# Patient Record
Sex: Female | Born: 1935
Health system: Southern US, Community
[De-identification: ages and names within clinical notes are randomized; demographics above are authoritative.]

## PROBLEM LIST (undated history)

## (undated) DIAGNOSIS — IMO0001 Reserved for inherently not codable concepts without codable children: Secondary | ICD-10-CM

## (undated) DIAGNOSIS — E039 Hypothyroidism, unspecified: Secondary | ICD-10-CM

## (undated) DIAGNOSIS — Z8719 Personal history of other diseases of the digestive system: Secondary | ICD-10-CM

## (undated) DIAGNOSIS — M1711 Unilateral primary osteoarthritis, right knee: Secondary | ICD-10-CM

## (undated) DIAGNOSIS — M1712 Unilateral primary osteoarthritis, left knee: Secondary | ICD-10-CM

## (undated) DIAGNOSIS — T8859XA Other complications of anesthesia, initial encounter: Secondary | ICD-10-CM

## (undated) DIAGNOSIS — C8290 Follicular lymphoma, unspecified, unspecified site: Secondary | ICD-10-CM

## (undated) DIAGNOSIS — Z950 Presence of cardiac pacemaker: Secondary | ICD-10-CM

## (undated) DIAGNOSIS — I10 Essential (primary) hypertension: Secondary | ICD-10-CM

## (undated) DIAGNOSIS — M1990 Unspecified osteoarthritis, unspecified site: Secondary | ICD-10-CM

## (undated) DIAGNOSIS — I350 Nonrheumatic aortic (valve) stenosis: Secondary | ICD-10-CM

## (undated) DIAGNOSIS — E785 Hyperlipidemia, unspecified: Secondary | ICD-10-CM

## (undated) DIAGNOSIS — J45909 Unspecified asthma, uncomplicated: Secondary | ICD-10-CM

## (undated) DIAGNOSIS — R112 Nausea with vomiting, unspecified: Secondary | ICD-10-CM

## (undated) DIAGNOSIS — Z8489 Family history of other specified conditions: Secondary | ICD-10-CM

## (undated) DIAGNOSIS — H8109 Meniere's disease, unspecified ear: Secondary | ICD-10-CM

## (undated) DIAGNOSIS — J329 Chronic sinusitis, unspecified: Secondary | ICD-10-CM

## (undated) DIAGNOSIS — I4891 Unspecified atrial fibrillation: Secondary | ICD-10-CM

## (undated) DIAGNOSIS — Z9889 Other specified postprocedural states: Secondary | ICD-10-CM

## (undated) DIAGNOSIS — R0600 Dyspnea, unspecified: Secondary | ICD-10-CM

## (undated) DIAGNOSIS — T4145XA Adverse effect of unspecified anesthetic, initial encounter: Secondary | ICD-10-CM

## (undated) DIAGNOSIS — K219 Gastro-esophageal reflux disease without esophagitis: Secondary | ICD-10-CM

## (undated) HISTORY — PX: LUMBAR FUSION: SHX111

## (undated) HISTORY — DX: Nonrheumatic aortic (valve) stenosis: I35.0

## (undated) HISTORY — DX: Unspecified atrial fibrillation: I48.91

## (undated) HISTORY — PX: CATARACT EXTRACTION: SUR2

## (undated) HISTORY — PX: CHOLECYSTECTOMY: SHX55

## (undated) HISTORY — PX: OTHER SURGICAL HISTORY: SHX169

## (undated) HISTORY — PX: ABDOMINAL HYSTERECTOMY: SHX81

## (undated) HISTORY — PX: CARPAL TUNNEL RELEASE: SHX101

## (undated) HISTORY — PX: KNEE ARTHROSCOPY: SUR90

## (undated) HISTORY — PX: NASAL SINUS SURGERY: SHX719

---

## 2012-12-25 DIAGNOSIS — R0600 Dyspnea, unspecified: Secondary | ICD-10-CM | POA: Insufficient documentation

## 2013-06-24 ENCOUNTER — Emergency Department (INDEPENDENT_AMBULATORY_CARE_PROVIDER_SITE_OTHER): Payer: Medicare Other

## 2013-06-24 ENCOUNTER — Emergency Department (INDEPENDENT_AMBULATORY_CARE_PROVIDER_SITE_OTHER)
Admission: EM | Admit: 2013-06-24 | Discharge: 2013-06-24 | Disposition: A | Discharge status: Home / Self Care | Payer: Medicare Other | Source: Home / Self Care | Attending: Family Medicine | Admitting: Family Medicine

## 2013-06-24 ENCOUNTER — Encounter: Payer: Self-pay | Admitting: Emergency Medicine

## 2013-06-24 DIAGNOSIS — J45909 Unspecified asthma, uncomplicated: Secondary | ICD-10-CM

## 2013-06-24 DIAGNOSIS — R05 Cough: Secondary | ICD-10-CM

## 2013-06-24 DIAGNOSIS — R059 Cough, unspecified: Secondary | ICD-10-CM

## 2013-06-24 DIAGNOSIS — J309 Allergic rhinitis, unspecified: Secondary | ICD-10-CM

## 2013-06-24 DIAGNOSIS — J3089 Other allergic rhinitis: Secondary | ICD-10-CM

## 2013-06-24 DIAGNOSIS — R9389 Abnormal findings on diagnostic imaging of other specified body structures: Secondary | ICD-10-CM

## 2013-06-24 HISTORY — DX: Essential (primary) hypertension: I10

## 2013-06-24 HISTORY — DX: Unspecified asthma, uncomplicated: J45.909

## 2013-06-24 HISTORY — DX: Chronic sinusitis, unspecified: J32.9

## 2013-06-24 HISTORY — DX: Hyperlipidemia, unspecified: E78.5

## 2013-06-24 HISTORY — DX: Reserved for inherently not codable concepts without codable children: IMO0001

## 2013-06-24 HISTORY — DX: Gastro-esophageal reflux disease without esophagitis: K21.9

## 2013-06-24 LAB — POCT CBC W AUTO DIFF (K'VILLE URGENT CARE)

## 2013-06-24 MED ORDER — ALBUTEROL SULFATE (2.5 MG/3ML) 0.083% IN NEBU
2.5000 mg | INHALATION_SOLUTION | Freq: Once | RESPIRATORY_TRACT | Status: AC
  Administered 2013-06-24: 2.5 mg via RESPIRATORY_TRACT

## 2013-06-24 MED ORDER — CEFDINIR 300 MG PO CAPS: 300.0000 mg | ORAL_CAPSULE | Freq: Two times a day (BID) | ORAL | Status: DC

## 2013-06-24 MED ORDER — AEROCHAMBER PLUS FLO-VU MEDIUM MISC: Status: DC

## 2013-06-24 MED ORDER — FLUTICASONE PROPIONATE 50 MCG/ACT NA SUSP: 2.0000 | Freq: Every day | NASAL | Status: DC

## 2013-06-24 MED ORDER — MONTELUKAST SODIUM 10 MG PO TABS: 10.0000 mg | ORAL_TABLET | Freq: Every day | ORAL | Status: DC

## 2013-06-24 NOTE — ED Provider Notes (Signed)
CSN: [id number]     Arrival date & time 06/24/13  1606 History   First MD Initiated Contact with Patient 06/24/13 1627     Chief Complaint  Patient presents with  . Cough  . Wheezing      HPI Comments: Patient lives in [place], visiting family here for several weeks.  She has a history of asthma.  Approximately 3 months ago she developed a URI that lasted approximately a month, and she was treated with 6 courses of antibiotics before she finally cleared.  She states that she had a chest X-ray on Nov 1 that was negative.  She arrived in [place] about one month ago, and 3 weeks ago she developed increased cough, sinus congestion, and wheezing.  She has had pleuritic pain in her left posterior chest for two days. Her current meds include Flovent, two puffs BID, and Ventolin BID prn.  She states that she has used a Flonase nasal inhaler in the past.  The history is provided by the patient and a relative.    Past Medical History  Diagnosis Date  . Hypertension   . Hyperlipidemia   . Reflux   . Asthma   . Chronic sinusitis    Past Surgical History  Procedure Laterality Date  . Cholecystectomy    . Abdominal hysterectomy    . Knee arthroscopy     Family History  Problem Relation Age of Onset  . Heart disease Other    History  Substance Use Topics  . Smoking status: Never Smoker   . Smokeless tobacco: Never Used  . Alcohol Use: No   OB History   Grav Para Term Preterm Abortions TAB SAB Ect Mult Living                 Review of Systems + sore throat + cough + left pleuritic pain + wheezing + nasal congestion + post-nasal drainage No sinus pain/pressure No itchy/red eyes No earache No hemoptysis + SOB No fever/chills No nausea No vomiting No abdominal pain No diarrhea No urinary symptoms No skin rash + fatigue No myalgias + headache Used OTC meds without relief  Allergies  Review of patient's allergies indicates no known allergies.  Home Medications    Current Outpatient Rx  Name  Route  Sig  Dispense  Refill  . albuterol (PROVENTIL) (2.5 MG/3ML) 0.083% nebulizer solution   Nebulization   Take 2.5 mg by nebulization every 6 (six) hours as needed for wheezing or shortness of breath.         . cetirizine (ZYRTEC) 10 MG tablet   Oral   Take 10 mg by mouth daily.         . fluticasone (FLOVENT HFA) 110 MCG/ACT inhaler   Inhalation   Inhale into the lungs 2 (two) times daily.         . hydrochlorothiazide (HYDRODIURIL) 25 MG tablet   Oral   Take 25 mg by mouth daily.         Marland Kitchen levothyroxine (SYNTHROID, LEVOTHROID) 88 MCG tablet   Oral   Take 88 mcg by mouth daily before breakfast.         . metoprolol (LOPRESSOR) 100 MG tablet   Oral   Take 100 mg by mouth 2 (two) times daily.         Marland Kitchen omeprazole (PRILOSEC) 40 MG capsule   Oral   Take 40 mg by mouth daily.         . pravastatin (PRAVACHOL) 80 MG  tablet   Oral   Take 80 mg by mouth daily.         . cefdinir (OMNICEF) 300 MG capsule   Oral   Take 1 capsule (300 mg total) by mouth 2 (two) times daily. (Rx void after 07/02/13)   20 capsule   0   . fluticasone (FLONASE) 50 MCG/ACT nasal spray   Each Nare   Place 2 sprays into both nostrils daily.   16 g   1   . montelukast (SINGULAIR) 10 MG tablet   Oral   Take 1 tablet (10 mg total) by mouth at bedtime.   30 tablet   1   . Spacer/Aero-Holding Chambers (AEROCHAMBER PLUS FLO-VU MEDIUM) MISC      Use with Flovent inhaler   1 each   0    BP 150/77  Pulse 78  Temp(Src) 98.1 F (36.7 C) (Oral)  Resp 20  Wt 179 lb (81.194 kg)  SpO2 96% Physical Exam Nursing notes and Vital Signs reviewed. Appearance:  Patient appears stated age, and in no acute distress Eyes:  Pupils are equal, round, and reactive to light and accomodation.  Extraocular movement is intact.  Conjunctivae are not inflamed  Ears:  Canals normal.  Tympanic membranes normal.  Nose:  Mildly congested turbinates.  No sinus  tenderness.   Pharynx:  Normal Neck:  Supple.  No adenopathy Lungs:   Diffuse wheezes bilaterally.  No rales.  Breath sounds are equal.  Heart:  Regular rate and rhythm without murmurs, rubs, or gallops.  Abdomen:  Nontender without masses or hepatosplenomegaly.  Bowel sounds are present.  No CVA or flank tenderness.  Extremities:  No edema.  No calf tenderness Skin:  No rash present.   ED Course  Procedures  none    Labs Reviewed  POCT CBC W AUTO DIFF ([facility]):  WBC 6.1; LY 50.8; MO 11.1; GR 38.1; Hgb 13.3; Platelets 253    Imaging Review Dg Sinuses Complete  06/24/2013   CLINICAL DATA:  Sinus congestion  EXAM: PARANASAL SINUSES - COMPLETE 3 + VIEW  COMPARISON:  None.  FINDINGS: The frontal, ethmoid, maxillary and sphenoid sinuses are well aerated. No acute bony abnormality is seen. Some mucosal thickening is noted within the maxillary sinuses on the [facility] view. No definitive air-fluid level is seen.  IMPRESSION: Mucosal thickening.  No other focal abnormality is noted.   Electronically Signed   By: Alcide Clever M.D.   On: 06/24/2013 17:32   Dg Chest 2 View  06/24/2013   CLINICAL DATA:  Cough  EXAM: CHEST  2 VIEW  COMPARISON:  None.  FINDINGS: The heart size and mediastinal contours are within normal limits. Both lungs are clear. The visualized skeletal structures are unremarkable.  IMPRESSION: No active cardiopulmonary disease.   Electronically Signed   By: Alcide Clever M.D.   On: 06/24/2013 17:31      MDM   1. Asthma, chronic, poorly controlled   2. Perennial allergic rhinitis    Albuterol nebulizer treatment administered There is no evidence of bacterial infection today.  Normal WBC is reassuring  Begin trial of Singulair.  Rx written for spacer.  Continue Flovent.  Patient declines prescription for prednisone. Take plain Mucinex (1200 mg guaifenesin) twice daily for cough and congestion.  Increase fluid intake, rest. Continue albuterol inhaler as needed. Begin  Omnicef if not improving about one week or if persistent fever develops (Given a prescription to hold, with an expiration date)  Follow-up with  family doctor if not improving10 days.     Lattie Haw, MD 06/25/13 [phone]

## 2013-06-24 NOTE — ED Notes (Signed)
[name] is visiting from [place]. She reports since September she has been on 6 different rounds of antibiotics for bronchitis, sinus infection/uri. She had cleared up before coming to [facility] mid-November when it started again.  Cough is productive and dark yellow in color, denies fever, fatigues, body aches, HA and wheezing.

## 2013-06-27 ENCOUNTER — Telehealth: Payer: Self-pay | Admitting: *Deleted

## 2013-06-30 DIAGNOSIS — G5603 Carpal tunnel syndrome, bilateral upper limbs: Secondary | ICD-10-CM | POA: Insufficient documentation

## 2013-08-05 DIAGNOSIS — M6530 Trigger finger, unspecified finger: Secondary | ICD-10-CM

## 2013-08-05 HISTORY — DX: Trigger finger, unspecified finger: M65.30

## 2014-01-14 DIAGNOSIS — E039 Hypothyroidism, unspecified: Secondary | ICD-10-CM | POA: Insufficient documentation

## 2014-06-16 HISTORY — PX: BONE MARROW BIOPSY: SHX199

## 2014-07-28 DIAGNOSIS — C8590 Non-Hodgkin lymphoma, unspecified, unspecified site: Secondary | ICD-10-CM | POA: Insufficient documentation

## 2014-12-18 DIAGNOSIS — C8290 Follicular lymphoma, unspecified, unspecified site: Secondary | ICD-10-CM | POA: Insufficient documentation

## 2015-09-15 DIAGNOSIS — I10 Essential (primary) hypertension: Secondary | ICD-10-CM | POA: Insufficient documentation

## 2016-08-14 DIAGNOSIS — M47816 Spondylosis without myelopathy or radiculopathy, lumbar region: Secondary | ICD-10-CM | POA: Diagnosis not present

## 2016-08-14 DIAGNOSIS — M722 Plantar fascial fibromatosis: Secondary | ICD-10-CM | POA: Diagnosis not present

## 2016-09-27 DIAGNOSIS — I10 Essential (primary) hypertension: Secondary | ICD-10-CM | POA: Diagnosis not present

## 2016-09-27 DIAGNOSIS — G8929 Other chronic pain: Secondary | ICD-10-CM | POA: Diagnosis not present

## 2016-09-27 DIAGNOSIS — M545 Low back pain: Secondary | ICD-10-CM | POA: Diagnosis not present

## 2016-09-27 DIAGNOSIS — I480 Paroxysmal atrial fibrillation: Secondary | ICD-10-CM | POA: Insufficient documentation

## 2016-09-27 DIAGNOSIS — J45909 Unspecified asthma, uncomplicated: Secondary | ICD-10-CM | POA: Diagnosis not present

## 2016-09-27 DIAGNOSIS — Z79899 Other long term (current) drug therapy: Secondary | ICD-10-CM | POA: Diagnosis not present

## 2016-09-27 DIAGNOSIS — M542 Cervicalgia: Secondary | ICD-10-CM | POA: Diagnosis not present

## 2016-09-27 DIAGNOSIS — Z7951 Long term (current) use of inhaled steroids: Secondary | ICD-10-CM | POA: Diagnosis not present

## 2016-09-27 DIAGNOSIS — E039 Hypothyroidism, unspecified: Secondary | ICD-10-CM | POA: Diagnosis not present

## 2016-09-27 DIAGNOSIS — Z7689 Persons encountering health services in other specified circumstances: Secondary | ICD-10-CM | POA: Diagnosis not present

## 2016-09-27 DIAGNOSIS — E7849 Other hyperlipidemia: Secondary | ICD-10-CM | POA: Insufficient documentation

## 2016-09-27 DIAGNOSIS — R0683 Snoring: Secondary | ICD-10-CM | POA: Diagnosis not present

## 2017-01-04 DIAGNOSIS — Z9049 Acquired absence of other specified parts of digestive tract: Secondary | ICD-10-CM | POA: Diagnosis not present

## 2017-01-04 DIAGNOSIS — K5730 Diverticulosis of large intestine without perforation or abscess without bleeding: Secondary | ICD-10-CM | POA: Diagnosis not present

## 2017-01-04 DIAGNOSIS — C8200 Follicular lymphoma grade I, unspecified site: Secondary | ICD-10-CM | POA: Diagnosis not present

## 2017-01-04 DIAGNOSIS — I7090 Unspecified atherosclerosis: Secondary | ICD-10-CM | POA: Diagnosis not present

## 2017-01-04 DIAGNOSIS — D3500 Benign neoplasm of unspecified adrenal gland: Secondary | ICD-10-CM | POA: Diagnosis not present

## 2017-01-09 DIAGNOSIS — I10 Essential (primary) hypertension: Secondary | ICD-10-CM | POA: Diagnosis not present

## 2017-01-09 DIAGNOSIS — E039 Hypothyroidism, unspecified: Secondary | ICD-10-CM | POA: Diagnosis not present

## 2017-01-09 DIAGNOSIS — I440 Atrioventricular block, first degree: Secondary | ICD-10-CM | POA: Diagnosis not present

## 2017-01-09 DIAGNOSIS — I4510 Unspecified right bundle-branch block: Secondary | ICD-10-CM | POA: Diagnosis not present

## 2017-01-09 DIAGNOSIS — E785 Hyperlipidemia, unspecified: Secondary | ICD-10-CM | POA: Diagnosis not present

## 2017-01-09 DIAGNOSIS — C8230 Follicular lymphoma grade IIIa, unspecified site: Secondary | ICD-10-CM | POA: Diagnosis not present

## 2017-01-09 DIAGNOSIS — C8100 Nodular lymphocyte predominant Hodgkin lymphoma, unspecified site: Secondary | ICD-10-CM | POA: Diagnosis not present

## 2017-01-09 DIAGNOSIS — I480 Paroxysmal atrial fibrillation: Secondary | ICD-10-CM | POA: Diagnosis not present

## 2017-01-09 DIAGNOSIS — R0600 Dyspnea, unspecified: Secondary | ICD-10-CM | POA: Diagnosis not present

## 2017-01-09 DIAGNOSIS — Z79899 Other long term (current) drug therapy: Secondary | ICD-10-CM | POA: Diagnosis not present

## 2017-01-16 DIAGNOSIS — R0600 Dyspnea, unspecified: Secondary | ICD-10-CM | POA: Diagnosis not present

## 2017-02-12 DIAGNOSIS — R0600 Dyspnea, unspecified: Secondary | ICD-10-CM | POA: Diagnosis not present

## 2017-02-12 DIAGNOSIS — I080 Rheumatic disorders of both mitral and aortic valves: Secondary | ICD-10-CM | POA: Diagnosis not present

## 2017-06-02 ENCOUNTER — Emergency Department (INDEPENDENT_AMBULATORY_CARE_PROVIDER_SITE_OTHER)
Admission: EM | Admit: 2017-06-02 | Discharge: 2017-06-02 | Disposition: A | Discharge status: Home / Self Care | Payer: Medicare Other | Source: Home / Self Care | Attending: Family Medicine | Admitting: Family Medicine

## 2017-06-02 ENCOUNTER — Encounter: Payer: Self-pay | Admitting: Emergency Medicine

## 2017-06-02 DIAGNOSIS — S61216A Laceration without foreign body of right little finger without damage to nail, initial encounter: Secondary | ICD-10-CM | POA: Diagnosis not present

## 2017-06-02 NOTE — ED Provider Notes (Signed)
[name]    CSN: [id number] Arrival date & time: 06/02/17  1542     History   Chief Complaint Chief Complaint  Patient presents with  . Laceration    HPI [name] is a 82 y.o. female.   Patient lacerated her right 5th finger on the edge of a glass table this morning.   The history is provided by the patient.  Laceration  Location:  Finger Length:  1cm Depth:  Through dermis Bleeding: controlled   Time since incident:  8 hours Injury mechanism: glass table edge. Pain details:    Severity:  No pain Foreign body present:  No foreign bodies Relieved by:  None tried Worsened by:  Nothing Ineffective treatments:  None tried Tetanus status:  Unknown   Past Medical History:  Diagnosis Date  . Asthma   . Chronic sinusitis   . Hyperlipidemia   . Hypertension   . Reflux     There are no active problems to display for this patient.   Past Surgical History:  Procedure Laterality Date  . ABDOMINAL HYSTERECTOMY    . CHOLECYSTECTOMY    . KNEE ARTHROSCOPY      OB History    No data available       Home Medications    Prior to Admission medications   Medication Sig Start Date End Date Taking? Authorizing Provider  albuterol (PROVENTIL) (2.5 MG/3ML) 0.083% nebulizer solution Take 2.5 mg by nebulization every 6 (six) hours as needed for wheezing or shortness of breath.    [provider]  cefdinir (OMNICEF) 300 MG capsule Take 1 capsule (300 mg total) by mouth 2 (two) times daily. (Rx void after 07/02/13) 06/24/13   Kandra Nicolas, MD  cetirizine (ZYRTEC) 10 MG tablet Take 10 mg by mouth daily.    [provider]  fluticasone (FLONASE) 50 MCG/ACT nasal spray Place 2 sprays into both nostrils daily. 06/24/13   Kandra Nicolas, MD  fluticasone (FLOVENT HFA) 110 MCG/ACT inhaler Inhale into the lungs 2 (two) times daily.    [provider]  hydrochlorothiazide (HYDRODIURIL) 25 MG tablet Take 25 mg by mouth daily.     [provider]  levothyroxine (SYNTHROID, LEVOTHROID) 88 MCG tablet Take 88 mcg by mouth daily before breakfast.    [provider]  metoprolol (LOPRESSOR) 100 MG tablet Take 100 mg by mouth 2 (two) times daily.    [provider]  montelukast (SINGULAIR) 10 MG tablet Take 1 tablet (10 mg total) by mouth at bedtime. 06/24/13   Kandra Nicolas, MD  omeprazole (PRILOSEC) 40 MG capsule Take 40 mg by mouth daily.    [provider]  pravastatin (PRAVACHOL) 80 MG tablet Take 80 mg by mouth daily.    [provider]  Spacer/Aero-Holding Chambers (AEROCHAMBER PLUS FLO-VU MEDIUM) MISC Use with Flovent inhaler 06/24/13   Kandra Nicolas, MD    Family History Family History  Problem Relation Age of Onset  . Heart disease Other     Social History Social History   Tobacco Use  . Smoking status: Never Smoker  . Smokeless tobacco: Never Used  Substance Use Topics  . Alcohol use: No  . Drug use: No     Allergies   Patient has no known allergies.   Review of Systems Review of Systems  All other systems reviewed and are negative.    Physical Exam Triage Vital Signs ED Triage Vitals  Enc Vitals Group     BP  06/02/17 1605 (!) 198/74     Pulse Rate 06/02/17 1605 67     Resp 06/02/17 1605 16     Temp 06/02/17 1605 98.3 F (36.8 C)     Temp Source 06/02/17 1605 Oral     SpO2 06/02/17 1605 94 %     Weight 06/02/17 1608 180 lb (81.6 kg)     Height 06/02/17 1608 5\' 5"  (1.651 m)     Head Circumference --      Peak Flow --      Pain Score --      Pain Loc --      Pain Edu? --      Excl. in [place]? --    No data found.  Updated Vital Signs BP (!) 198/74 (BP Location: Right Arm)   Pulse 67   Temp 98.3 F (36.8 C) (Oral)   Resp 16   Ht 5\' 5"  (1.651 m)   Wt 180 lb (81.6 kg)   SpO2 94%   BMI 29.95 kg/m   Visual Acuity Right Eye Distance:   Left Eye Distance:   Bilateral Distance:    Right Eye Near:   Left Eye Near:    Bilateral  Near:     Physical Exam  HENT:  Head: Normocephalic.  Eyes: Conjunctivae are normal. Pupils are equal, round, and reactive to light.  Cardiovascular: Normal rate.  Pulmonary/Chest: Breath sounds normal.  Musculoskeletal:       Right hand: She exhibits laceration. She exhibits normal range of motion, no tenderness, no bony tenderness, normal two-point discrimination, normal capillary refill, no deformity and no swelling.       Hands: Right fifth finger has 1cm long simple laceration over ulnar aspect of proximal phalanx.  Finger has full range of motion all joints.  Distal neurovascular function is intact.   Neurological: She is alert.  Skin: Skin is warm and dry.  Nursing note and vitals reviewed.    UC Treatments / Results  Labs (all labs ordered are listed, but only abnormal results are displayed) Labs Reviewed - No data to display  EKG  EKG Interpretation None       Radiology No results found.  Procedures Procedures  Laceration Repair Discussed benefits and risks of procedure and verbal consent obtained. Using sterile technique and local anesthesia with 1% lidocaine without epinephrine, cleansed wound with Betadine followed by copious lavage with normal saline.  Wound carefully inspected for debris and foreign bodies; none found.  Wound closed with #3, 5-0 interrupted nylon sutures.  Bacitracin and non-stick sterile dressing applied.  Wound precautions explained to patient.  Return for suture removal in 10 days.   Medications Ordered in UC Medications - No data to display   Initial Impression / Assessment and Plan / UC Course  I have reviewed the triage vital signs and the nursing notes.  Pertinent labs & imaging results that were available during my care of the patient were reviewed by me and considered in my medical decision making (see chart for details).    Change dressing daily and apply Bacitracin ointment to wound.  Keep wound clean and dry.  Return for any  signs of infection (or follow-up with family doctor):  Increasing redness, swelling, pain, heat, drainage, etc. Return in 10 days for suture removal.      Final Clinical Impressions(s) / UC Diagnoses   Final diagnoses:  Laceration of right little finger without foreign body without damage to nail, initial encounter    ED Discharge  Orders    None           Kandra Nicolas, MD 06/03/17 [phone]

## 2017-06-02 NOTE — ED Triage Notes (Signed)
Patient presents to [facility] with C/O laceration in the right little finger over a piece of glass.

## 2017-06-02 NOTE — Discharge Instructions (Signed)
Change dressing daily and apply Bacitracin ointment to wound.  Keep wound clean and dry.  Return for any signs of infection (or follow-up with family doctor):  Increasing redness, swelling, pain, heat, drainage, etc. °Return in 10 days for suture removal.   °

## 2017-07-16 DIAGNOSIS — M5030 Other cervical disc degeneration, unspecified cervical region: Secondary | ICD-10-CM | POA: Insufficient documentation

## 2017-07-16 DIAGNOSIS — G43A0 Cyclical vomiting, not intractable: Secondary | ICD-10-CM | POA: Diagnosis not present

## 2017-07-16 DIAGNOSIS — I10 Essential (primary) hypertension: Secondary | ICD-10-CM | POA: Diagnosis not present

## 2017-07-16 DIAGNOSIS — E6609 Other obesity due to excess calories: Secondary | ICD-10-CM | POA: Insufficient documentation

## 2017-07-16 DIAGNOSIS — E7849 Other hyperlipidemia: Secondary | ICD-10-CM | POA: Diagnosis not present

## 2017-07-16 DIAGNOSIS — I480 Paroxysmal atrial fibrillation: Secondary | ICD-10-CM | POA: Diagnosis not present

## 2017-07-16 DIAGNOSIS — K219 Gastro-esophageal reflux disease without esophagitis: Secondary | ICD-10-CM | POA: Insufficient documentation

## 2017-07-16 DIAGNOSIS — Z6830 Body mass index (BMI) 30.0-30.9, adult: Secondary | ICD-10-CM

## 2017-07-16 DIAGNOSIS — E039 Hypothyroidism, unspecified: Secondary | ICD-10-CM | POA: Diagnosis not present

## 2017-07-16 DIAGNOSIS — C8100 Nodular lymphocyte predominant Hodgkin lymphoma, unspecified site: Secondary | ICD-10-CM | POA: Diagnosis not present

## 2017-07-19 DIAGNOSIS — E039 Hypothyroidism, unspecified: Secondary | ICD-10-CM | POA: Diagnosis not present

## 2017-07-19 DIAGNOSIS — R1115 Cyclical vomiting syndrome unrelated to migraine: Secondary | ICD-10-CM | POA: Insufficient documentation

## 2017-07-19 DIAGNOSIS — E7849 Other hyperlipidemia: Secondary | ICD-10-CM | POA: Diagnosis not present

## 2017-07-19 DIAGNOSIS — Z7982 Long term (current) use of aspirin: Secondary | ICD-10-CM | POA: Diagnosis not present

## 2017-07-19 DIAGNOSIS — C8100 Nodular lymphocyte predominant Hodgkin lymphoma, unspecified site: Secondary | ICD-10-CM | POA: Diagnosis not present

## 2017-07-19 DIAGNOSIS — I480 Paroxysmal atrial fibrillation: Secondary | ICD-10-CM | POA: Diagnosis not present

## 2017-07-19 DIAGNOSIS — E669 Obesity, unspecified: Secondary | ICD-10-CM | POA: Diagnosis not present

## 2017-07-19 DIAGNOSIS — I10 Essential (primary) hypertension: Secondary | ICD-10-CM | POA: Diagnosis not present

## 2017-07-19 DIAGNOSIS — C8200 Follicular lymphoma grade I, unspecified site: Secondary | ICD-10-CM | POA: Diagnosis not present

## 2017-07-19 DIAGNOSIS — C8230 Follicular lymphoma grade IIIa, unspecified site: Secondary | ICD-10-CM | POA: Diagnosis not present

## 2017-07-19 DIAGNOSIS — K219 Gastro-esophageal reflux disease without esophagitis: Secondary | ICD-10-CM | POA: Diagnosis not present

## 2017-07-19 DIAGNOSIS — Z6830 Body mass index (BMI) 30.0-30.9, adult: Secondary | ICD-10-CM | POA: Diagnosis not present

## 2017-07-19 DIAGNOSIS — Z79899 Other long term (current) drug therapy: Secondary | ICD-10-CM | POA: Diagnosis not present

## 2017-08-07 DIAGNOSIS — J9859 Other diseases of mediastinum, not elsewhere classified: Secondary | ICD-10-CM | POA: Diagnosis not present

## 2017-08-07 DIAGNOSIS — C8591 Non-Hodgkin lymphoma, unspecified, lymph nodes of head, face, and neck: Secondary | ICD-10-CM | POA: Diagnosis not present

## 2017-08-07 DIAGNOSIS — C8200 Follicular lymphoma grade I, unspecified site: Secondary | ICD-10-CM | POA: Diagnosis not present

## 2017-08-09 DIAGNOSIS — C8234 Follicular lymphoma grade IIIa, lymph nodes of axilla and upper limb: Secondary | ICD-10-CM | POA: Diagnosis not present

## 2017-08-09 DIAGNOSIS — I10 Essential (primary) hypertension: Secondary | ICD-10-CM | POA: Diagnosis not present

## 2017-08-09 DIAGNOSIS — C8230 Follicular lymphoma grade IIIa, unspecified site: Secondary | ICD-10-CM | POA: Diagnosis not present

## 2017-08-09 DIAGNOSIS — C8100 Nodular lymphocyte predominant Hodgkin lymphoma, unspecified site: Secondary | ICD-10-CM | POA: Diagnosis not present

## 2017-08-09 DIAGNOSIS — E7849 Other hyperlipidemia: Secondary | ICD-10-CM | POA: Diagnosis not present

## 2017-08-13 DIAGNOSIS — C8230 Follicular lymphoma grade IIIa, unspecified site: Secondary | ICD-10-CM | POA: Diagnosis not present

## 2017-08-17 DIAGNOSIS — E039 Hypothyroidism, unspecified: Secondary | ICD-10-CM | POA: Diagnosis not present

## 2017-08-17 DIAGNOSIS — I10 Essential (primary) hypertension: Secondary | ICD-10-CM | POA: Diagnosis not present

## 2017-08-17 DIAGNOSIS — K219 Gastro-esophageal reflux disease without esophagitis: Secondary | ICD-10-CM | POA: Diagnosis not present

## 2017-08-17 DIAGNOSIS — I480 Paroxysmal atrial fibrillation: Secondary | ICD-10-CM | POA: Diagnosis not present

## 2017-08-17 DIAGNOSIS — Z6830 Body mass index (BMI) 30.0-30.9, adult: Secondary | ICD-10-CM | POA: Diagnosis not present

## 2017-08-17 DIAGNOSIS — E6609 Other obesity due to excess calories: Secondary | ICD-10-CM | POA: Diagnosis not present

## 2017-08-23 DIAGNOSIS — Z510 Encounter for antineoplastic radiation therapy: Secondary | ICD-10-CM | POA: Diagnosis not present

## 2017-08-23 DIAGNOSIS — C8230 Follicular lymphoma grade IIIa, unspecified site: Secondary | ICD-10-CM | POA: Diagnosis not present

## 2017-08-27 DIAGNOSIS — C8230 Follicular lymphoma grade IIIa, unspecified site: Secondary | ICD-10-CM | POA: Diagnosis not present

## 2017-08-28 DIAGNOSIS — Z510 Encounter for antineoplastic radiation therapy: Secondary | ICD-10-CM | POA: Diagnosis not present

## 2017-08-28 DIAGNOSIS — C8230 Follicular lymphoma grade IIIa, unspecified site: Secondary | ICD-10-CM | POA: Diagnosis not present

## 2017-09-03 DIAGNOSIS — Z510 Encounter for antineoplastic radiation therapy: Secondary | ICD-10-CM | POA: Diagnosis not present

## 2017-09-03 DIAGNOSIS — C8230 Follicular lymphoma grade IIIa, unspecified site: Secondary | ICD-10-CM | POA: Diagnosis not present

## 2017-09-04 DIAGNOSIS — Z510 Encounter for antineoplastic radiation therapy: Secondary | ICD-10-CM | POA: Diagnosis not present

## 2017-09-04 DIAGNOSIS — C8230 Follicular lymphoma grade IIIa, unspecified site: Secondary | ICD-10-CM | POA: Diagnosis not present

## 2017-09-19 DIAGNOSIS — M545 Low back pain: Secondary | ICD-10-CM | POA: Diagnosis not present

## 2017-09-19 DIAGNOSIS — M47816 Spondylosis without myelopathy or radiculopathy, lumbar region: Secondary | ICD-10-CM | POA: Diagnosis not present

## 2017-09-20 DIAGNOSIS — M545 Low back pain: Secondary | ICD-10-CM | POA: Diagnosis not present

## 2017-09-24 DIAGNOSIS — M545 Low back pain: Secondary | ICD-10-CM | POA: Diagnosis not present

## 2017-09-26 DIAGNOSIS — M545 Low back pain: Secondary | ICD-10-CM | POA: Diagnosis not present

## 2017-09-28 DIAGNOSIS — M545 Low back pain: Secondary | ICD-10-CM | POA: Diagnosis not present

## 2017-09-28 DIAGNOSIS — M7918 Myalgia, other site: Secondary | ICD-10-CM | POA: Diagnosis not present

## 2017-09-28 DIAGNOSIS — M47816 Spondylosis without myelopathy or radiculopathy, lumbar region: Secondary | ICD-10-CM | POA: Diagnosis not present

## 2017-10-03 DIAGNOSIS — M545 Low back pain: Secondary | ICD-10-CM | POA: Diagnosis not present

## 2017-10-04 DIAGNOSIS — Z79899 Other long term (current) drug therapy: Secondary | ICD-10-CM | POA: Diagnosis not present

## 2017-10-04 DIAGNOSIS — I454 Nonspecific intraventricular block: Secondary | ICD-10-CM | POA: Diagnosis not present

## 2017-10-04 DIAGNOSIS — I4510 Unspecified right bundle-branch block: Secondary | ICD-10-CM | POA: Diagnosis not present

## 2017-10-04 DIAGNOSIS — C8590 Non-Hodgkin lymphoma, unspecified, unspecified site: Secondary | ICD-10-CM | POA: Diagnosis not present

## 2017-10-04 DIAGNOSIS — I480 Paroxysmal atrial fibrillation: Secondary | ICD-10-CM | POA: Diagnosis not present

## 2017-10-04 DIAGNOSIS — I440 Atrioventricular block, first degree: Secondary | ICD-10-CM | POA: Diagnosis not present

## 2017-10-05 DIAGNOSIS — I440 Atrioventricular block, first degree: Secondary | ICD-10-CM | POA: Diagnosis not present

## 2017-10-05 DIAGNOSIS — I4510 Unspecified right bundle-branch block: Secondary | ICD-10-CM | POA: Diagnosis not present

## 2017-10-05 DIAGNOSIS — I491 Atrial premature depolarization: Secondary | ICD-10-CM | POA: Diagnosis not present

## 2017-11-12 DIAGNOSIS — M545 Low back pain: Secondary | ICD-10-CM | POA: Diagnosis not present

## 2017-11-12 DIAGNOSIS — M47816 Spondylosis without myelopathy or radiculopathy, lumbar region: Secondary | ICD-10-CM | POA: Diagnosis not present

## 2017-11-12 DIAGNOSIS — M179 Osteoarthritis of knee, unspecified: Secondary | ICD-10-CM | POA: Diagnosis not present

## 2017-11-12 DIAGNOSIS — Z6829 Body mass index (BMI) 29.0-29.9, adult: Secondary | ICD-10-CM | POA: Diagnosis not present

## 2017-12-12 DIAGNOSIS — M47812 Spondylosis without myelopathy or radiculopathy, cervical region: Secondary | ICD-10-CM | POA: Diagnosis not present

## 2017-12-12 DIAGNOSIS — M542 Cervicalgia: Secondary | ICD-10-CM | POA: Diagnosis not present

## 2018-01-17 DIAGNOSIS — I10 Essential (primary) hypertension: Secondary | ICD-10-CM | POA: Diagnosis not present

## 2018-01-17 DIAGNOSIS — E039 Hypothyroidism, unspecified: Secondary | ICD-10-CM | POA: Diagnosis not present

## 2018-01-17 DIAGNOSIS — E785 Hyperlipidemia, unspecified: Secondary | ICD-10-CM | POA: Diagnosis not present

## 2018-01-17 DIAGNOSIS — C8232 Follicular lymphoma grade IIIa, intrathoracic lymph nodes: Secondary | ICD-10-CM | POA: Diagnosis not present

## 2018-01-17 DIAGNOSIS — C8230 Follicular lymphoma grade IIIa, unspecified site: Secondary | ICD-10-CM | POA: Diagnosis not present

## 2018-01-17 DIAGNOSIS — Z9221 Personal history of antineoplastic chemotherapy: Secondary | ICD-10-CM | POA: Diagnosis not present

## 2018-01-25 DIAGNOSIS — R079 Chest pain, unspecified: Secondary | ICD-10-CM | POA: Diagnosis not present

## 2018-01-29 DIAGNOSIS — C8232 Follicular lymphoma grade IIIa, intrathoracic lymph nodes: Secondary | ICD-10-CM | POA: Diagnosis not present

## 2018-02-13 DIAGNOSIS — R0600 Dyspnea, unspecified: Secondary | ICD-10-CM | POA: Diagnosis not present

## 2018-02-13 DIAGNOSIS — C8590 Non-Hodgkin lymphoma, unspecified, unspecified site: Secondary | ICD-10-CM | POA: Diagnosis not present

## 2018-02-13 DIAGNOSIS — I480 Paroxysmal atrial fibrillation: Secondary | ICD-10-CM | POA: Diagnosis not present

## 2018-05-09 DIAGNOSIS — C8232 Follicular lymphoma grade IIIa, intrathoracic lymph nodes: Secondary | ICD-10-CM | POA: Diagnosis not present

## 2018-05-09 DIAGNOSIS — I10 Essential (primary) hypertension: Secondary | ICD-10-CM | POA: Diagnosis not present

## 2018-05-09 DIAGNOSIS — R0609 Other forms of dyspnea: Secondary | ICD-10-CM | POA: Diagnosis not present

## 2018-05-09 DIAGNOSIS — I480 Paroxysmal atrial fibrillation: Secondary | ICD-10-CM | POA: Diagnosis not present

## 2018-05-09 DIAGNOSIS — J329 Chronic sinusitis, unspecified: Secondary | ICD-10-CM | POA: Diagnosis not present

## 2018-05-09 DIAGNOSIS — Z79899 Other long term (current) drug therapy: Secondary | ICD-10-CM | POA: Diagnosis not present

## 2018-05-10 DIAGNOSIS — M170 Bilateral primary osteoarthritis of knee: Secondary | ICD-10-CM | POA: Diagnosis not present

## 2018-05-16 DIAGNOSIS — J339 Nasal polyp, unspecified: Secondary | ICD-10-CM | POA: Diagnosis not present

## 2018-05-16 DIAGNOSIS — J328 Other chronic sinusitis: Secondary | ICD-10-CM | POA: Diagnosis not present

## 2018-05-16 DIAGNOSIS — J329 Chronic sinusitis, unspecified: Secondary | ICD-10-CM | POA: Diagnosis not present

## 2018-05-28 DIAGNOSIS — R0609 Other forms of dyspnea: Secondary | ICD-10-CM | POA: Diagnosis not present

## 2018-05-28 DIAGNOSIS — C8232 Follicular lymphoma grade IIIa, intrathoracic lymph nodes: Secondary | ICD-10-CM | POA: Diagnosis not present

## 2018-05-29 DIAGNOSIS — G5603 Carpal tunnel syndrome, bilateral upper limbs: Secondary | ICD-10-CM | POA: Diagnosis not present

## 2018-05-29 DIAGNOSIS — M19031 Primary osteoarthritis, right wrist: Secondary | ICD-10-CM | POA: Diagnosis not present

## 2018-05-29 DIAGNOSIS — M65332 Trigger finger, left middle finger: Secondary | ICD-10-CM | POA: Diagnosis not present

## 2018-05-29 DIAGNOSIS — M180 Bilateral primary osteoarthritis of first carpometacarpal joints: Secondary | ICD-10-CM | POA: Diagnosis not present

## 2018-05-29 DIAGNOSIS — M65342 Trigger finger, left ring finger: Secondary | ICD-10-CM | POA: Diagnosis not present

## 2018-06-03 NOTE — Patient Instructions (Addendum)
[name]  06/03/2018   Your procedure is scheduled on: Tuesday 06/18/2018  Report to [facility] Main  Entrance              Report to admitting at   0530 AM    Call this number if you have problems the morning of surgery [phone]    Remember: Do not eat food or drink liquids :After Midnight.              BRUSH YOUR TEETH MORNING OF SURGERY AND RINSE YOUR MOUTH OUT, NO CHEWING GUM CANDY OR MINTS.     Take these medicines the morning of surgery with A SIP OF WATER: Amlodipine (Norvasc), Isosorbide Mononitrate (Imdur), Levothyroxine (Synthroid), Metoprolol (Lopressor), Omeprazole (Prilosec), use nasal inhaler                       (Nasacort), use eye drops if needed, use Flovent inhaler if needed and bring inhaler with you to the hospital.                                You may not have any metal on your body including hair pins and              piercings  Do not wear jewelry, make-up, lotions, powders or perfumes, deodorant             Do not wear nail polish.  Do not shave  48 hours prior to surgery.              Do not bring valuables to the hospital. [place].  Contacts, dentures or bridgework may not be worn into surgery.  Leave suitcase in the car. After surgery it may be brought to your room.                  Please read over the following fact sheets you were given: _____________________________________________________________________             [facility] - Preparing for Surgery Before surgery, you can play an important role.  Because skin is not sterile, your skin needs to be as free of germs as possible.  You can reduce the number of germs on your skin by washing with CHG (chlorahexidine gluconate) soap before surgery.  CHG is an antiseptic cleaner which kills germs and bonds with the skin to continue killing germs even after washing. Please DO NOT use if you have an allergy to CHG or  antibacterial soaps.  If your skin becomes reddened/irritated stop using the CHG and inform your nurse when you arrive at Short Stay. Do not shave (including legs and underarms) for at least 48 hours prior to the first CHG shower.  You may shave your face/neck. Please follow these instructions carefully:  1.  Shower with CHG Soap the night before surgery and the  morning of Surgery.  2.  If you choose to wash your hair, wash your hair first as usual with your  normal  shampoo.  3.  After you shampoo, rinse your hair and body thoroughly to remove the  shampoo.  4.  Use CHG as you would any other liquid soap.  You can apply chg directly  to the skin and wash                       Gently with a scrungie or clean washcloth.  5.  Apply the CHG Soap to your body ONLY FROM THE NECK DOWN.   Do not use on face/ open                           Wound or open sores. Avoid contact with eyes, ears mouth and genitals (private parts).                       Wash face,  Genitals (private parts) with your normal soap.             6.  Wash thoroughly, paying special attention to the area where your surgery  will be performed.  7.  Thoroughly rinse your body with warm water from the neck down.  8.  DO NOT shower/wash with your normal soap after using and rinsing off  the CHG Soap.                9.  Pat yourself dry with a clean towel.            10.  Wear clean pajamas.            11.  Place clean sheets on your bed the night of your first shower and do not  sleep with pets. Day of Surgery : Do not apply any lotions/deodorants the morning of surgery.  Please wear clean clothes to the hospital/surgery center.  FAILURE TO FOLLOW THESE INSTRUCTIONS MAY RESULT IN THE CANCELLATION OF YOUR SURGERY PATIENT SIGNATURE_________________________________  NURSE SIGNATURE__________________________________  ________________________________________________________________________   [name]  An incentive spirometer is a tool that can help keep your lungs clear and active. This tool measures how well you are filling your lungs with each breath. Taking long deep breaths may help reverse or decrease the chance of developing breathing (pulmonary) problems (especially infection) following:  A long period of time when you are unable to move or be active. BEFORE THE PROCEDURE   If the spirometer includes an indicator to show your best effort, your nurse or respiratory therapist will set it to a desired goal.  If possible, sit up straight or lean slightly forward. Try not to slouch.  Hold the incentive spirometer in an upright position. INSTRUCTIONS FOR USE  1. Sit on the edge of your bed if possible, or sit up as far as you can in bed or on a chair. 2. Hold the incentive spirometer in an upright position. 3. Breathe out normally. 4. Place the mouthpiece in your mouth and seal your lips tightly around it. 5. Breathe in slowly and as deeply as possible, raising the piston or the ball toward the top of the column. 6. Hold your breath for 3-5 seconds or for as long as possible. Allow the piston or ball to fall to the bottom of the column. 7. Remove the mouthpiece from your mouth and breathe out normally. 8. Rest for a few seconds and repeat Steps 1 through 7 at least 10 times every 1-2 hours when you are awake. Take your time and take a few normal breaths between deep breaths. 9. The spirometer may include an indicator to show  your best effort. Use the indicator as a goal to work toward during each repetition. 10. After each set of 10 deep breaths, practice coughing to be sure your lungs are clear. If you have an incision (the cut made at the time of surgery), support your incision when coughing by placing a pillow or rolled up towels firmly against it. Once you are able to get out of bed, walk around indoors and cough well. You may stop using the incentive spirometer when  instructed by your caregiver.  RISKS AND COMPLICATIONS  Take your time so you do not get dizzy or light-headed.  If you are in pain, you may need to take or ask for pain medication before doing incentive spirometry. It is harder to take a deep breath if you are having pain. AFTER USE  Rest and breathe slowly and easily.  It can be helpful to keep track of a log of your progress. Your caregiver can provide you with a simple table to help with this. If you are using the spirometer at home, follow these instructions: [place] IF:   You are having difficultly using the spirometer.  You have trouble using the spirometer as often as instructed.  Your pain medication is not giving enough relief while using the spirometer.  You develop fever of 100.5 F (38.1 C) or higher. SEEK IMMEDIATE MEDICAL CARE IF:   You cough up bloody sputum that had not been present before.  You develop fever of 102 F (38.9 C) or greater.  You develop worsening pain at or near the incision site. MAKE SURE YOU:   Understand these instructions.  Will watch your condition.  Will get help right away if you are not doing well or get worse. Document Released: 11/06/2006 Document Revised: 09/18/2011 Document Reviewed: 01/07/2007 [facility] Patient Information 2014 [place], [place].   ________________________________________________________________________

## 2018-06-04 ENCOUNTER — Encounter (HOSPITAL_COMMUNITY)
Admission: RE | Admit: 2018-06-04 | Discharge: 2018-06-04 | Disposition: A | Discharge status: Home / Self Care | Payer: Medicare Other | Source: Ambulatory Visit | Attending: Orthopedic Surgery | Admitting: Orthopedic Surgery

## 2018-06-04 ENCOUNTER — Encounter (HOSPITAL_COMMUNITY): Payer: Self-pay

## 2018-06-04 ENCOUNTER — Other Ambulatory Visit: Payer: Self-pay

## 2018-06-04 DIAGNOSIS — I4510 Unspecified right bundle-branch block: Secondary | ICD-10-CM | POA: Insufficient documentation

## 2018-06-04 DIAGNOSIS — Z01818 Encounter for other preprocedural examination: Secondary | ICD-10-CM | POA: Insufficient documentation

## 2018-06-04 DIAGNOSIS — M1711 Unilateral primary osteoarthritis, right knee: Secondary | ICD-10-CM | POA: Diagnosis not present

## 2018-06-04 DIAGNOSIS — I10 Essential (primary) hypertension: Secondary | ICD-10-CM | POA: Diagnosis not present

## 2018-06-04 HISTORY — DX: Other complications of anesthesia, initial encounter: T88.59XA

## 2018-06-04 HISTORY — DX: Follicular lymphoma, unspecified, unspecified site: C82.90

## 2018-06-04 HISTORY — DX: Unspecified osteoarthritis, unspecified site: M19.90

## 2018-06-04 HISTORY — DX: Meniere's disease, unspecified ear: H81.09

## 2018-06-04 HISTORY — DX: Hypothyroidism, unspecified: E03.9

## 2018-06-04 HISTORY — DX: Family history of other specified conditions: Z84.89

## 2018-06-04 HISTORY — DX: Nausea with vomiting, unspecified: R11.2

## 2018-06-04 HISTORY — DX: Other specified postprocedural states: Z98.890

## 2018-06-04 HISTORY — DX: Adverse effect of unspecified anesthetic, initial encounter: T41.45XA

## 2018-06-04 LAB — BASIC METABOLIC PANEL
Anion gap: 7 (ref 5–15)
BUN: 30 mg/dL — ABNORMAL HIGH (ref 8–23)
CO2: 30 mmol/L (ref 22–32)
Calcium: 9.2 mg/dL (ref 8.9–10.3)
Chloride: 104 mmol/L (ref 98–111)
Creatinine, Ser: 1.32 mg/dL — ABNORMAL HIGH (ref 0.44–1.00)
GFR calc Af Amer: 44 mL/min — ABNORMAL LOW (ref >60)
GFR calc non Af Amer: 38 mL/min — ABNORMAL LOW (ref >60)
Glucose, Bld: 96 mg/dL (ref 70–99)
Potassium: 4.2 mmol/L (ref 3.5–5.1)
Sodium: 141 mmol/L (ref 135–145)

## 2018-06-04 LAB — CBC
HCT: 41.2 % (ref 36.0–46.0)
Hemoglobin: 13.3 g/dL (ref 12.0–15.0)
MCH: 29.2 pg (ref 26.0–34.0)
MCHC: 32.3 g/dL (ref 30.0–36.0)
MCV: 90.5 fL (ref 80.0–100.0)
Platelets: 294 10*3/uL (ref 150–400)
RBC: 4.55 MIL/uL (ref 3.87–5.11)
RDW: 12.9 % (ref 11.5–15.5)
WBC: 7.6 10*3/uL (ref 4.0–10.5)
nRBC: 0 % (ref 0.0–0.2)

## 2018-06-04 LAB — SURGICAL PCR SCREEN
MRSA, PCR: NEGATIVE
Staphylococcus aureus: NEGATIVE

## 2018-06-05 NOTE — Progress Notes (Signed)
Consulted Dr. Adele Barthel, MDA face to face about patient's EKG from 06/04/2018. Per Dr. Roanna Banning, EKG is ok for patient to have surgery. Patient has no complaints of chest pain or discomfort at pre-op appointment on 06/04/2018.

## 2018-06-11 ENCOUNTER — Encounter (HOSPITAL_COMMUNITY): Payer: Medicare Other

## 2018-06-13 DIAGNOSIS — L814 Other melanin hyperpigmentation: Secondary | ICD-10-CM | POA: Diagnosis not present

## 2018-06-13 DIAGNOSIS — L708 Other acne: Secondary | ICD-10-CM | POA: Diagnosis not present

## 2018-06-13 DIAGNOSIS — L821 Other seborrheic keratosis: Secondary | ICD-10-CM | POA: Diagnosis not present

## 2018-06-18 ENCOUNTER — Encounter (HOSPITAL_COMMUNITY)
Admission: RE | Disposition: A | Discharge status: Home / Self Care | Payer: Self-pay | Source: Ambulatory Visit | Attending: Orthopedic Surgery

## 2018-06-18 ENCOUNTER — Observation Stay (HOSPITAL_COMMUNITY): Payer: Medicare Other

## 2018-06-18 ENCOUNTER — Ambulatory Visit (HOSPITAL_COMMUNITY): Payer: Medicare Other | Admitting: Anesthesiology

## 2018-06-18 ENCOUNTER — Encounter (HOSPITAL_COMMUNITY): Payer: Self-pay | Admitting: *Deleted

## 2018-06-18 ENCOUNTER — Other Ambulatory Visit: Payer: Self-pay

## 2018-06-18 ENCOUNTER — Observation Stay (HOSPITAL_COMMUNITY)
Admission: RE | Admit: 2018-06-18 | Discharge: 2018-06-19 | Disposition: A | Discharge status: Home / Self Care | Payer: Medicare Other | Source: Ambulatory Visit | Attending: Orthopedic Surgery | Admitting: Orthopedic Surgery

## 2018-06-18 DIAGNOSIS — Z79899 Other long term (current) drug therapy: Secondary | ICD-10-CM | POA: Diagnosis not present

## 2018-06-18 DIAGNOSIS — Z96651 Presence of right artificial knee joint: Secondary | ICD-10-CM

## 2018-06-18 DIAGNOSIS — Z7982 Long term (current) use of aspirin: Secondary | ICD-10-CM | POA: Diagnosis not present

## 2018-06-18 DIAGNOSIS — M1711 Unilateral primary osteoarthritis, right knee: Secondary | ICD-10-CM | POA: Diagnosis not present

## 2018-06-18 DIAGNOSIS — Z79890 Hormone replacement therapy (postmenopausal): Secondary | ICD-10-CM | POA: Diagnosis not present

## 2018-06-18 DIAGNOSIS — I10 Essential (primary) hypertension: Secondary | ICD-10-CM | POA: Insufficient documentation

## 2018-06-18 DIAGNOSIS — G8918 Other acute postprocedural pain: Secondary | ICD-10-CM | POA: Diagnosis not present

## 2018-06-18 DIAGNOSIS — E039 Hypothyroidism, unspecified: Secondary | ICD-10-CM | POA: Diagnosis not present

## 2018-06-18 DIAGNOSIS — E785 Hyperlipidemia, unspecified: Secondary | ICD-10-CM | POA: Diagnosis not present

## 2018-06-18 HISTORY — PX: PARTIAL KNEE ARTHROPLASTY: SHX2174

## 2018-06-18 HISTORY — DX: Unilateral primary osteoarthritis, right knee: M17.11

## 2018-06-18 SURGERY — ARTHROPLASTY, KNEE, UNICOMPARTMENTAL
Anesthesia: Spinal | Site: Knee | Laterality: Right

## 2018-06-18 MED ORDER — ONDANSETRON HCL 4 MG/2ML IJ SOLN: 4.0000 mg | Freq: Four times a day (QID) | INTRAMUSCULAR | Status: DC | PRN

## 2018-06-18 MED ORDER — CEFAZOLIN SODIUM-DEXTROSE 2-4 GM/100ML-% IV SOLN
2.0000 g | Freq: Four times a day (QID) | INTRAVENOUS | Status: AC
  Administered 2018-06-18 (×2): 2 g via INTRAVENOUS
  Filled 2018-06-18 (×2): qty 100

## 2018-06-18 MED ORDER — SODIUM CHLORIDE 0.9 % IV SOLN
INTRAVENOUS | Status: DC | PRN
  Administered 2018-06-18: 25 ug/min via INTRAVENOUS
  Administered 2018-06-18: 35 ug/min via INTRAVENOUS

## 2018-06-18 MED ORDER — OXYCODONE HCL 5 MG/5ML PO SOLN
5.0000 mg | Freq: Once | ORAL | Status: DC | PRN
  Filled 2018-06-18: qty 5

## 2018-06-18 MED ORDER — HYDROCODONE-ACETAMINOPHEN 10-325 MG PO TABS: 1.0000 | ORAL_TABLET | Freq: Four times a day (QID) | ORAL | 0 refills | Status: DC | PRN

## 2018-06-18 MED ORDER — METHOCARBAMOL 500 MG IVPB - SIMPLE MED
500.0000 mg | Freq: Four times a day (QID) | INTRAVENOUS | Status: DC | PRN
  Administered 2018-06-18: 500 mg via INTRAVENOUS
  Filled 2018-06-18: qty 50

## 2018-06-18 MED ORDER — METHOCARBAMOL 500 MG IVPB - SIMPLE MED
INTRAVENOUS | Status: AC
  Filled 2018-06-18: qty 50

## 2018-06-18 MED ORDER — BACLOFEN 10 MG PO TABS: 10.0000 mg | ORAL_TABLET | Freq: Three times a day (TID) | ORAL | 0 refills | Status: DC

## 2018-06-18 MED ORDER — BISACODYL 10 MG RE SUPP: 10.0000 mg | Freq: Every day | RECTAL | Status: DC | PRN

## 2018-06-18 MED ORDER — ARTIFICIAL TEARS OPHTHALMIC OINT
TOPICAL_OINTMENT | OPHTHALMIC | Status: AC
  Filled 2018-06-18: qty 3.5

## 2018-06-18 MED ORDER — ACETAMINOPHEN 500 MG PO TABS
500.0000 mg | ORAL_TABLET | Freq: Four times a day (QID) | ORAL | Status: AC
  Administered 2018-06-18 – 2018-06-19 (×4): 500 mg via ORAL
  Filled 2018-06-18 (×4): qty 1

## 2018-06-18 MED ORDER — CEFAZOLIN SODIUM-DEXTROSE 2-4 GM/100ML-% IV SOLN
2.0000 g | INTRAVENOUS | Status: AC
  Administered 2018-06-18: 2 g via INTRAVENOUS
  Filled 2018-06-18: qty 100

## 2018-06-18 MED ORDER — PROPOFOL 500 MG/50ML IV EMUL
INTRAVENOUS | Status: DC | PRN
  Administered 2018-06-18: 75 ug/kg/min via INTRAVENOUS

## 2018-06-18 MED ORDER — CALCIUM-MAGNESIUM-ZINC 333-133-5 MG PO TABS: ORAL_TABLET | Freq: Every day | ORAL | Status: DC

## 2018-06-18 MED ORDER — ALUM & MAG HYDROXIDE-SIMETH 200-200-20 MG/5ML PO SUSP: 30.0000 mL | ORAL | Status: DC | PRN

## 2018-06-18 MED ORDER — KETOROLAC TROMETHAMINE 15 MG/ML IJ SOLN
7.5000 mg | Freq: Four times a day (QID) | INTRAMUSCULAR | Status: AC
  Administered 2018-06-18 – 2018-06-19 (×4): 7.5 mg via INTRAVENOUS
  Filled 2018-06-18 (×4): qty 1

## 2018-06-18 MED ORDER — ACYCLOVIR 5 % EX OINT
1.0000 "application " | TOPICAL_OINTMENT | Freq: Every day | CUTANEOUS | Status: DC | PRN
  Filled 2018-06-18: qty 15

## 2018-06-18 MED ORDER — PHENOL 1.4 % MT LIQD
1.0000 | OROMUCOSAL | Status: DC | PRN
  Filled 2018-06-18: qty 177

## 2018-06-18 MED ORDER — TRIAMCINOLONE ACETONIDE 55 MCG/ACT NA AERO
2.0000 | INHALATION_SPRAY | Freq: Every day | NASAL | Status: DC
  Administered 2018-06-19: 2 via NASAL
  Filled 2018-06-18: qty 21.6

## 2018-06-18 MED ORDER — HYDROCODONE-ACETAMINOPHEN 7.5-325 MG PO TABS: 1.0000 | ORAL_TABLET | ORAL | Status: DC | PRN

## 2018-06-18 MED ORDER — METHOCARBAMOL 500 MG PO TABS
500.0000 mg | ORAL_TABLET | Freq: Four times a day (QID) | ORAL | Status: DC | PRN
  Administered 2018-06-18 – 2018-06-19 (×2): 500 mg via ORAL
  Filled 2018-06-18 (×2): qty 1

## 2018-06-18 MED ORDER — HYDROCODONE-ACETAMINOPHEN 5-325 MG PO TABS
1.0000 | ORAL_TABLET | ORAL | Status: DC | PRN
  Administered 2018-06-18 – 2018-06-19 (×3): 1 via ORAL
  Filled 2018-06-18 (×3): qty 1

## 2018-06-18 MED ORDER — PHENYLEPHRINE HCL 10 MG/ML IJ SOLN
INTRAMUSCULAR | Status: AC
  Filled 2018-06-18: qty 1

## 2018-06-18 MED ORDER — SENNA-DOCUSATE SODIUM 8.6-50 MG PO TABS: 2.0000 | ORAL_TABLET | Freq: Every day | ORAL | 1 refills | Status: AC

## 2018-06-18 MED ORDER — DEXAMETHASONE SODIUM PHOSPHATE 10 MG/ML IJ SOLN
8.0000 mg | Freq: Once | INTRAMUSCULAR | Status: AC
  Administered 2018-06-18: 8 mg via INTRAVENOUS

## 2018-06-18 MED ORDER — KETOROLAC TROMETHAMINE 30 MG/ML IJ SOLN
INTRAMUSCULAR | Status: DC | PRN
  Administered 2018-06-18: 30 mg

## 2018-06-18 MED ORDER — KETOROLAC TROMETHAMINE 30 MG/ML IJ SOLN
INTRAMUSCULAR | Status: AC
  Filled 2018-06-18: qty 1

## 2018-06-18 MED ORDER — ONDANSETRON HCL 4 MG/2ML IJ SOLN
INTRAMUSCULAR | Status: DC | PRN
  Administered 2018-06-18: 4 mg via INTRAVENOUS

## 2018-06-18 MED ORDER — GABAPENTIN 300 MG PO CAPS
300.0000 mg | ORAL_CAPSULE | Freq: Once | ORAL | Status: AC
  Administered 2018-06-18: 300 mg via ORAL
  Filled 2018-06-18: qty 1

## 2018-06-18 MED ORDER — MIDAZOLAM HCL 2 MG/2ML IJ SOLN
1.0000 mg | INTRAMUSCULAR | Status: DC
  Filled 2018-06-18: qty 2

## 2018-06-18 MED ORDER — CALCIUM CARBONATE 1250 (500 CA) MG PO TABS
1.0000 | ORAL_TABLET | Freq: Every day | ORAL | Status: DC
  Administered 2018-06-19: 500 mg via ORAL
  Filled 2018-06-18: qty 1

## 2018-06-18 MED ORDER — NAPHAZOLINE-GLYCERIN 0.012-0.2 % OP SOLN
2.0000 [drp] | Freq: Four times a day (QID) | OPHTHALMIC | Status: DC | PRN
  Filled 2018-06-18: qty 15

## 2018-06-18 MED ORDER — METOCLOPRAMIDE HCL 5 MG/ML IJ SOLN: 5.0000 mg | Freq: Three times a day (TID) | INTRAMUSCULAR | Status: DC | PRN

## 2018-06-18 MED ORDER — HYDROCHLOROTHIAZIDE 25 MG PO TABS
25.0000 mg | ORAL_TABLET | Freq: Every day | ORAL | Status: DC
  Administered 2018-06-19: 25 mg via ORAL
  Filled 2018-06-18 (×2): qty 1

## 2018-06-18 MED ORDER — ACETAMINOPHEN 325 MG PO TABS: 325.0000 mg | ORAL_TABLET | Freq: Four times a day (QID) | ORAL | Status: DC | PRN

## 2018-06-18 MED ORDER — ZINC SULFATE 220 (50 ZN) MG PO CAPS
220.0000 mg | ORAL_CAPSULE | Freq: Every day | ORAL | Status: DC
  Administered 2018-06-19: 220 mg via ORAL
  Filled 2018-06-18: qty 1

## 2018-06-18 MED ORDER — DIPHENHYDRAMINE HCL 12.5 MG/5ML PO ELIX: 12.5000 mg | ORAL_SOLUTION | ORAL | Status: DC | PRN

## 2018-06-18 MED ORDER — HYDROMORPHONE HCL 1 MG/ML IJ SOLN: 0.2500 mg | INTRAMUSCULAR | Status: DC | PRN

## 2018-06-18 MED ORDER — ACETAMINOPHEN 500 MG PO TABS
1000.0000 mg | ORAL_TABLET | Freq: Once | ORAL | Status: AC
  Administered 2018-06-18: 1000 mg via ORAL
  Filled 2018-06-18: qty 2

## 2018-06-18 MED ORDER — BUDESONIDE 0.5 MG/2ML IN SUSP
0.5000 mg | Freq: Two times a day (BID) | RESPIRATORY_TRACT | Status: DC
  Filled 2018-06-18: qty 2

## 2018-06-18 MED ORDER — POTASSIUM CHLORIDE IN NACL 20-0.45 MEQ/L-% IV SOLN
INTRAVENOUS | Status: DC
  Administered 2018-06-18 – 2018-06-19 (×2): via INTRAVENOUS
  Filled 2018-06-18 (×2): qty 1000

## 2018-06-18 MED ORDER — OXYCODONE HCL 5 MG PO TABS: 5.0000 mg | ORAL_TABLET | Freq: Once | ORAL | Status: DC | PRN

## 2018-06-18 MED ORDER — PANTOPRAZOLE SODIUM 40 MG PO TBEC
80.0000 mg | DELAYED_RELEASE_TABLET | Freq: Every day | ORAL | Status: DC
  Administered 2018-06-19: 80 mg via ORAL
  Filled 2018-06-18: qty 2

## 2018-06-18 MED ORDER — FENTANYL CITRATE (PF) 100 MCG/2ML IJ SOLN
INTRAMUSCULAR | Status: DC | PRN
  Administered 2018-06-18: 50 ug via INTRAVENOUS

## 2018-06-18 MED ORDER — MAGNESIUM CITRATE PO SOLN: 1.0000 | Freq: Once | ORAL | Status: DC | PRN

## 2018-06-18 MED ORDER — PROPOFOL 10 MG/ML IV BOLUS
INTRAVENOUS | Status: AC
  Filled 2018-06-18: qty 20

## 2018-06-18 MED ORDER — PRAVASTATIN SODIUM 20 MG PO TABS
80.0000 mg | ORAL_TABLET | Freq: Every day | ORAL | Status: DC
  Administered 2018-06-18: 80 mg via ORAL
  Filled 2018-06-18: qty 4

## 2018-06-18 MED ORDER — FENTANYL CITRATE (PF) 100 MCG/2ML IJ SOLN
INTRAMUSCULAR | Status: AC
  Filled 2018-06-18: qty 2

## 2018-06-18 MED ORDER — METOCLOPRAMIDE HCL 5 MG PO TABS: 5.0000 mg | ORAL_TABLET | Freq: Three times a day (TID) | ORAL | Status: DC | PRN

## 2018-06-18 MED ORDER — ROPIVACAINE HCL 5 MG/ML IJ SOLN
INTRAMUSCULAR | Status: DC | PRN
  Administered 2018-06-18: 20 mL via PERINEURAL

## 2018-06-18 MED ORDER — DEXAMETHASONE SODIUM PHOSPHATE 10 MG/ML IJ SOLN
10.0000 mg | Freq: Once | INTRAMUSCULAR | Status: AC
  Administered 2018-06-19: 10 mg via INTRAVENOUS
  Filled 2018-06-18: qty 1

## 2018-06-18 MED ORDER — PROPOFOL 10 MG/ML IV BOLUS
INTRAVENOUS | Status: AC
  Filled 2018-06-18: qty 60

## 2018-06-18 MED ORDER — STERILE WATER FOR IRRIGATION IR SOLN
Status: DC | PRN
  Administered 2018-06-18: 1000 mL

## 2018-06-18 MED ORDER — PROMETHAZINE HCL 25 MG/ML IJ SOLN: 6.2500 mg | INTRAMUSCULAR | Status: DC | PRN

## 2018-06-18 MED ORDER — MORPHINE SULFATE (PF) 2 MG/ML IV SOLN: 0.5000 mg | INTRAVENOUS | Status: DC | PRN

## 2018-06-18 MED ORDER — SODIUM CHLORIDE 0.9 % IR SOLN
Status: DC | PRN
  Administered 2018-06-18: 1000 mL

## 2018-06-18 MED ORDER — BUPIVACAINE HCL (PF) 0.25 % IJ SOLN
INTRAMUSCULAR | Status: AC
  Filled 2018-06-18: qty 30

## 2018-06-18 MED ORDER — ISOSORBIDE MONONITRATE ER 30 MG PO TB24
30.0000 mg | ORAL_TABLET | Freq: Every day | ORAL | Status: DC
  Administered 2018-06-19: 30 mg via ORAL
  Filled 2018-06-18: qty 1

## 2018-06-18 MED ORDER — ONDANSETRON HCL 4 MG PO TABS: 4.0000 mg | ORAL_TABLET | Freq: Three times a day (TID) | ORAL | 0 refills | Status: DC | PRN

## 2018-06-18 MED ORDER — ONDANSETRON HCL 4 MG PO TABS: 4.0000 mg | ORAL_TABLET | Freq: Four times a day (QID) | ORAL | Status: DC | PRN

## 2018-06-18 MED ORDER — DOCUSATE SODIUM 100 MG PO CAPS
100.0000 mg | ORAL_CAPSULE | Freq: Two times a day (BID) | ORAL | Status: DC
  Administered 2018-06-18 – 2018-06-19 (×2): 100 mg via ORAL
  Filled 2018-06-18 (×2): qty 1

## 2018-06-18 MED ORDER — BUPIVACAINE HCL (PF) 0.75 % IJ SOLN
INTRAMUSCULAR | Status: DC | PRN
  Administered 2018-06-18: 1.6 mL via INTRATHECAL

## 2018-06-18 MED ORDER — LOSARTAN POTASSIUM 50 MG PO TABS: 100.0000 mg | ORAL_TABLET | Freq: Every day | ORAL | Status: DC

## 2018-06-18 MED ORDER — LEVOTHYROXINE SODIUM 88 MCG PO TABS
88.0000 ug | ORAL_TABLET | Freq: Every day | ORAL | Status: DC
  Administered 2018-06-19: 88 ug via ORAL
  Filled 2018-06-18: qty 1

## 2018-06-18 MED ORDER — PROPOFOL 10 MG/ML IV BOLUS
INTRAVENOUS | Status: DC | PRN
  Administered 2018-06-18 (×2): 30 mg via INTRAVENOUS

## 2018-06-18 MED ORDER — FENTANYL CITRATE (PF) 100 MCG/2ML IJ SOLN
50.0000 ug | INTRAMUSCULAR | Status: DC
  Filled 2018-06-18: qty 2

## 2018-06-18 MED ORDER — METOPROLOL TARTRATE 50 MG PO TABS
100.0000 mg | ORAL_TABLET | Freq: Two times a day (BID) | ORAL | Status: DC
  Administered 2018-06-18 – 2018-06-19 (×2): 100 mg via ORAL
  Filled 2018-06-18 (×2): qty 2

## 2018-06-18 MED ORDER — ZOLPIDEM TARTRATE 5 MG PO TABS: 5.0000 mg | ORAL_TABLET | Freq: Every evening | ORAL | Status: DC | PRN

## 2018-06-18 MED ORDER — LOSARTAN POTASSIUM 50 MG PO TABS
100.0000 mg | ORAL_TABLET | Freq: Every day | ORAL | Status: DC
  Administered 2018-06-18: 100 mg via ORAL
  Filled 2018-06-18: qty 2

## 2018-06-18 MED ORDER — DEXAMETHASONE SODIUM PHOSPHATE 10 MG/ML IJ SOLN
INTRAMUSCULAR | Status: AC
  Filled 2018-06-18: qty 1

## 2018-06-18 MED ORDER — AMLODIPINE BESYLATE 5 MG PO TABS
5.0000 mg | ORAL_TABLET | Freq: Every day | ORAL | Status: DC
  Administered 2018-06-19: 5 mg via ORAL
  Filled 2018-06-18: qty 1

## 2018-06-18 MED ORDER — TRANEXAMIC ACID-NACL 1000-0.7 MG/100ML-% IV SOLN
INTRAVENOUS | Status: AC
  Filled 2018-06-18: qty 100

## 2018-06-18 MED ORDER — ONDANSETRON HCL 4 MG/2ML IJ SOLN
INTRAMUSCULAR | Status: AC
  Filled 2018-06-18: qty 2

## 2018-06-18 MED ORDER — LACTATED RINGERS IV SOLN
INTRAVENOUS | Status: DC
  Administered 2018-06-18 (×2): via INTRAVENOUS

## 2018-06-18 MED ORDER — POLYETHYLENE GLYCOL 3350 17 G PO PACK: 17.0000 g | PACK | Freq: Every day | ORAL | Status: DC | PRN

## 2018-06-18 MED ORDER — MENTHOL 3 MG MT LOZG: 1.0000 | LOZENGE | OROMUCOSAL | Status: DC | PRN

## 2018-06-18 MED ORDER — 0.9 % SODIUM CHLORIDE (POUR BTL) OPTIME
TOPICAL | Status: DC | PRN
  Administered 2018-06-18: 1000 mL

## 2018-06-18 MED ORDER — TRANEXAMIC ACID-NACL 1000-0.7 MG/100ML-% IV SOLN
1000.0000 mg | Freq: Once | INTRAVENOUS | Status: AC
  Administered 2018-06-18: 1000 mg via INTRAVENOUS
  Filled 2018-06-18: qty 100

## 2018-06-18 MED ORDER — BUPIVACAINE HCL (PF) 0.25 % IJ SOLN
INTRAMUSCULAR | Status: DC | PRN
  Administered 2018-06-18: 30 mL

## 2018-06-18 MED ORDER — MAGNESIUM OXIDE 400 (241.3 MG) MG PO TABS
200.0000 mg | ORAL_TABLET | Freq: Every day | ORAL | Status: DC
  Administered 2018-06-19: 200 mg via ORAL
  Filled 2018-06-18: qty 1

## 2018-06-18 MED ORDER — MIDAZOLAM HCL 2 MG/2ML IJ SOLN
INTRAMUSCULAR | Status: AC
  Filled 2018-06-18: qty 2

## 2018-06-18 MED ORDER — ASPIRIN EC 325 MG PO TBEC: 325.0000 mg | DELAYED_RELEASE_TABLET | Freq: Two times a day (BID) | ORAL | 0 refills | Status: DC

## 2018-06-18 MED ORDER — ASPIRIN EC 325 MG PO TBEC
325.0000 mg | DELAYED_RELEASE_TABLET | Freq: Two times a day (BID) | ORAL | Status: DC
  Administered 2018-06-19: 325 mg via ORAL
  Filled 2018-06-18: qty 1

## 2018-06-18 SURGICAL SUPPLY — 68 items
BAG ZIPLOCK 12X15 (MISCELLANEOUS) ×2 IMPLANT
BANDAGE ESMARK 6X9 LF (GAUZE/BANDAGES/DRESSINGS) ×1 IMPLANT
BEARING ANATOMIC 5 (Orthopedic Implant) ×2 IMPLANT
BNDG ELASTIC 6X10 VLCR STRL LF (GAUZE/BANDAGES/DRESSINGS) ×2 IMPLANT
BNDG ESMARK 6X9 LF (GAUZE/BANDAGES/DRESSINGS) ×2
BOWL SMART MIX CTS (DISPOSABLE) ×2 IMPLANT
CEMENT BONE R 1X40 (Cement) ×2 IMPLANT
CLSR STERI-STRIP ANTIMIC 1/2X4 (GAUZE/BANDAGES/DRESSINGS) ×2 IMPLANT
COMPONENT TIBIAL OXFRD MEDL RT (Orthopedic Implant) ×1 IMPLANT
COVER SURGICAL LIGHT HANDLE (MISCELLANEOUS) ×2 IMPLANT
COVER WAND RF STERILE (DRAPES) ×2 IMPLANT
CUFF TOURN SGL QUICK 34 (TOURNIQUET CUFF) ×1
CUFF TRNQT CYL 34X4X40X1 (TOURNIQUET CUFF) ×1 IMPLANT
DECANTER SPIKE VIAL GLASS SM (MISCELLANEOUS) ×2 IMPLANT
DRAPE EXTREMITY T 121X128X90 (DRAPE) ×2 IMPLANT
DRAPE POUCH INSTRU U-SHP 10X18 (DRAPES) ×2 IMPLANT
DRAPE U-SHAPE 47X51 STRL (DRAPES) ×2 IMPLANT
DRSG MEPILEX BORDER 4X8 (GAUZE/BANDAGES/DRESSINGS) ×2 IMPLANT
DURAPREP 26ML APPLICATOR (WOUND CARE) ×4 IMPLANT
ELECT REM PT RETURN 15FT ADLT (MISCELLANEOUS) ×2 IMPLANT
FACESHIELD WRAPAROUND (MASK) IMPLANT
GLOVE BIOGEL PI IND STRL 7.0 (GLOVE) ×3 IMPLANT
GLOVE BIOGEL PI IND STRL 7.5 (GLOVE) ×1 IMPLANT
GLOVE BIOGEL PI IND STRL 8 (GLOVE) ×2 IMPLANT
GLOVE BIOGEL PI IND STRL 8.5 (GLOVE) ×1 IMPLANT
GLOVE BIOGEL PI INDICATOR 7.0 (GLOVE) ×3
GLOVE BIOGEL PI INDICATOR 7.5 (GLOVE) ×1
GLOVE BIOGEL PI INDICATOR 8 (GLOVE) ×2
GLOVE BIOGEL PI INDICATOR 8.5 (GLOVE) ×1
GLOVE ORTHO TXT STRL SZ7.5 (GLOVE) ×2 IMPLANT
GLOVE SURG ORTHO 8.0 STRL STRW (GLOVE) ×2 IMPLANT
GOWN SRG XL XLNG 56XLVL 4 (GOWN DISPOSABLE) ×1 IMPLANT
GOWN STRL NON-REIN XL XLG LVL4 (GOWN DISPOSABLE) ×1
GOWN STRL REUS W/TWL 2XL LVL3 (GOWN DISPOSABLE) ×2 IMPLANT
GOWN STRL REUS W/TWL LRG LVL3 (GOWN DISPOSABLE) ×2 IMPLANT
HANDPIECE INTERPULSE COAX TIP (DISPOSABLE) ×1
HOLDER FOLEY CATH W/STRAP (MISCELLANEOUS) ×2 IMPLANT
HOOD PEEL AWAY FLYTE STAYCOOL (MISCELLANEOUS) ×4 IMPLANT
IMMOBILIZER KNEE 20 (SOFTGOODS)
IMMOBILIZER KNEE 20 THIGH 36 (SOFTGOODS) IMPLANT
IMMOBILIZER KNEE 22 (SOFTGOODS) ×2 IMPLANT
IMMOBILIZER KNEE 22 UNIV (SOFTGOODS) ×2 IMPLANT
KIT BASIN OR (CUSTOM PROCEDURE TRAY) ×2 IMPLANT
NDL SAFETY ECLIPSE 18X1.5 (NEEDLE) ×2 IMPLANT
NEEDLE HYPO 18GX1.5 SHARP (NEEDLE) ×2
NS IRRIG 1000ML POUR BTL (IV SOLUTION) ×2 IMPLANT
PACK BLADE SAW RECIP 70 3 PT (BLADE) ×2 IMPLANT
PACK ICE MAXI GEL EZY WRAP (MISCELLANEOUS) ×2 IMPLANT
PACK TOTAL JOINT (CUSTOM PROCEDURE TRAY) ×2 IMPLANT
PAD ABD 8X10 STRL (GAUZE/BANDAGES/DRESSINGS) ×2 IMPLANT
PEG TWIN FEM CEMENTED MED (Knees) ×2 IMPLANT
PROTECTOR NERVE ULNAR (MISCELLANEOUS) ×2 IMPLANT
SET HNDPC FAN SPRY TIP SCT (DISPOSABLE) ×1 IMPLANT
SUCTION FRAZIER HANDLE 12FR (TUBING) ×1
SUCTION TUBE FRAZIER 12FR DISP (TUBING) ×1 IMPLANT
SUT VIC AB 0 CT1 36 (SUTURE) ×4 IMPLANT
SUT VIC AB 2-0 CT1 27 (SUTURE) ×1
SUT VIC AB 2-0 CT1 TAPERPNT 27 (SUTURE) ×1 IMPLANT
SUT VIC AB 3-0 SH 8-18 (SUTURE) ×2 IMPLANT
SYR 20CC LL (SYRINGE) ×2 IMPLANT
SYR 3ML LL SCALE MARK (SYRINGE) ×2 IMPLANT
TIBIAL OXFORD MEDIAL RT (Orthopedic Implant) ×2 IMPLANT
TOWEL OR 17X26 10 PK STRL BLUE (TOWEL DISPOSABLE) ×2 IMPLANT
TOWEL OR NON WOVEN STRL DISP B (DISPOSABLE) ×2 IMPLANT
TRAY FOLEY CATH 14FRSI W/METER (CATHETERS) ×2 IMPLANT
TRAY FOLEY MTR SLVR 16FR STAT (SET/KITS/TRAYS/PACK) IMPLANT
WATER STERILE IRR 1000ML POUR (IV SOLUTION) ×2 IMPLANT
WRAP KNEE MAXI GEL POST OP (GAUZE/BANDAGES/DRESSINGS) ×2 IMPLANT

## 2018-06-18 NOTE — Anesthesia Procedure Notes (Signed)
Anesthesia Regional Block: Adductor canal block   Pre-Anesthetic Checklist: ,, timeout performed, Correct Patient, Correct Site, Correct Laterality, Correct Procedure, Correct Position, site marked, Risks and benefits discussed,  Surgical consent,  Pre-op evaluation,  At surgeon's request and post-op pain management  Laterality: Right  Prep: chloraprep       Needles:  Injection technique: Single-shot  Needle Type: Stimiplex     Needle Length: 9cm  Needle Gauge: 21     Additional Needles:   Procedures:,,,, ultrasound used (permanent image in chart),,,,  Narrative:  Start time: 06/18/2018 7:12 AM End time: 06/18/2018 7:17 AM Injection made incrementally with aspirations every 5 mL.  Performed by: Personally  Anesthesiologist: Lynda Rainwater, MD

## 2018-06-18 NOTE — Transfer of Care (Signed)
Immediate Anesthesia Transfer of Care Note  Patient: [name]  Procedure(s) Performed: UNICOMPARTMENTAL KNEE (Right Knee)  Patient Location: PACU  Anesthesia Type:Spinal  Level of Consciousness: awake, alert , oriented and patient cooperative  Airway & Oxygen Therapy: Patient Spontanous Breathing and Patient connected to face mask oxygen  Post-op Assessment: Report given to RN and Post -op Vital signs reviewed and stable  Post vital signs: stable  Last Vitals:  Vitals Value Taken Time  BP 137/62 06/18/2018  9:40 AM  Temp    Pulse 73 06/18/2018  9:42 AM  Resp 19 06/18/2018  9:42 AM  SpO2 98 % 06/18/2018  9:42 AM  Vitals shown include unvalidated device data.  Last Pain:  Vitals:   06/18/18 0651  TempSrc:   PainSc: 0-No pain         Complications: No apparent anesthesia complications

## 2018-06-18 NOTE — Anesthesia Postprocedure Evaluation (Signed)
Anesthesia Post Note  Patient: [name]  Procedure(s) Performed: UNICOMPARTMENTAL KNEE (Right Knee)     Patient location during evaluation: PACU Anesthesia Type: Spinal Level of consciousness: oriented and awake and alert Pain management: pain level controlled Vital Signs Assessment: post-procedure vital signs reviewed and stable Respiratory status: spontaneous breathing and respiratory function stable Cardiovascular status: blood pressure returned to baseline and stable Postop Assessment: no headache, no backache and no apparent nausea or vomiting Anesthetic complications: no    Last Vitals:  Vitals:   06/18/18 1030 06/18/18 1115  BP: (!) 163/75   Pulse: 66   Resp: 19   Temp:  37.3 C  SpO2: 99%     Last Pain:  Vitals:   06/18/18 1030  TempSrc:   PainSc: 0-No pain                 Lynda Rainwater

## 2018-06-18 NOTE — Discharge Instructions (Signed)

## 2018-06-18 NOTE — Anesthesia Preprocedure Evaluation (Signed)
Anesthesia Evaluation  Patient identified by MRN, date of birth, ID band Patient awake    Reviewed: Allergy & Precautions, NPO status , Patient's Chart, lab work & pertinent test results, reviewed documented beta blocker date and time   History of Anesthesia Complications (+) PONV  Airway Mallampati: II  TM Distance: >3 FB Neck ROM: Full    Dental no notable dental hx.    Pulmonary asthma ,    Pulmonary exam normal breath sounds clear to auscultation       Cardiovascular hypertension, Pt. on medications and Pt. on home beta blockers negative cardio ROS Normal cardiovascular exam Rhythm:Regular Rate:Normal     Neuro/Psych negative neurological ROS  negative psych ROS   GI/Hepatic negative GI ROS, Neg liver ROS,   Endo/Other  Hypothyroidism   Renal/GU negative Renal ROS  negative genitourinary   Musculoskeletal  (+) Arthritis , Osteoarthritis,    Abdominal (+) + obese,   Peds negative pediatric ROS (+)  Hematology negative hematology ROS (+)   Anesthesia Other Findings   Reproductive/Obstetrics negative OB ROS                             Anesthesia Physical Anesthesia Plan  ASA: II  Anesthesia Plan: Spinal   Post-op Pain Management:  Regional for Post-op pain   Induction: Intravenous  PONV Risk Score and Plan: 3 and Ondansetron, Dexamethasone and Midazolam  Airway Management Planned: Simple Face Mask  Additional Equipment:   Intra-op Plan:   Post-operative Plan:   Informed Consent: I have reviewed the patients History and Physical, chart, labs and discussed the procedure including the risks, benefits and alternatives for the proposed anesthesia with the patient or authorized representative who has indicated his/her understanding and acceptance.   Dental advisory given  Plan Discussed with: CRNA  Anesthesia Plan Comments:         Anesthesia Quick Evaluation

## 2018-06-18 NOTE — Anesthesia Procedure Notes (Signed)
Spinal  Patient location during procedure: OR Start time: 06/18/2018 7:39 AM End time: 06/18/2018 7:44 AM Staffing Anesthesiologist: Lynda Rainwater, MD Performed: anesthesiologist  Preanesthetic Checklist Completed: patient identified, site marked, surgical consent, pre-op evaluation, timeout performed, IV checked, risks and benefits discussed and monitors and equipment checked Spinal Block Patient position: sitting Prep: ChloraPrep Patient monitoring: heart rate, continuous pulse ox and blood pressure Approach: midline Location: L3-4 Injection technique: single-shot Needle Needle type: Quincke  Needle gauge: 22 G Needle length: 9 cm

## 2018-06-18 NOTE — Evaluation (Addendum)
Physical Therapy Evaluation Patient Details Name: [name] MRN: [id number] DOB: 1935/06/06 Today's Date: 06/18/2018   History of Present Illness  RTKA  Clinical Impression  The patient mobilize x 20', having cramp in right foot. ,Pt admitted with above diagnosis. Pt currently with functional limitations due to the deficits listed below (see PT Problem List).  Pt will benefit from skilled PT to increase their independence and safety with mobility to allow discharge to the venue listed below.       Follow Up Recommendations Follow surgeon's recommendation for DC plan and follow-up therapies    Equipment Recommendations  Need to assess rollator , may need a 2 wheeled  RW.   Recommendations for Other Services       Precautions / Restrictions Precautions Precautions: Knee;Fall Precaution Comments: did not wear this vist, having cramps in foot Required Braces or Orthoses: Knee Immobilizer - Right Knee Immobilizer - Right: Discontinue once straight leg raise with < 10 degree lag      Mobility  Bed Mobility Overal bed mobility: Needs Assistance Bed Mobility: Supine to Sit     Supine to sit: Min assist     General bed mobility comments: extratime  Transfers Overall transfer level: Needs assistance Equipment used: Rolling walker (2 wheeled) Transfers: Sit to/from Stand Sit to Stand: Min assist         General transfer comment: cues for hand nd right leg position  Ambulation/Gait Ambulation/Gait assistance: Min assist Gait Distance (Feet): 20 Feet Assistive device: Rolling walker (2 wheeled) Gait Pattern/deviations: Step-to pattern;Antalgic     General Gait Details: cues for sequence  Stairs            Wheelchair Mobility    Modified Rankin (Stroke Patients Only)       Balance                                             Pertinent Vitals/Pain Pain Assessment: 0-10 Pain Score: 2  Pain Location:  right knee, right foot cramp a  10, noted cramping of the foot Pain Descriptors / Indicators: Sore;Spasm;Cramping Pain Intervention(s): Premedicated before session;Monitored during session;Limited activity within patient's tolerance;Repositioned;Ice applied    Home Living Family/patient expects to be discharged to:: Private residence Living Arrangements: Children Available Help at Discharge: Family Type of Home: House Home Access: Level entry     Home Layout: One level Home Equipment: Environmental consultant - 4 wheels      Prior Function Level of Independence: Independent               Hand Dominance        Extremity/Trunk Assessment   Upper Extremity Assessment Upper Extremity Assessment: Overall WFL for tasks assessed    Lower Extremity Assessment RLE Deficits / Details: + SLR, knee flexion 40 degr       Communication   Communication: No difficulties  Cognition Arousal/Alertness: Awake/alert Behavior During Therapy: WFL for tasks assessed/performed Overall Cognitive Status: Within Functional Limits for tasks assessed                                        General Comments      Exercises Total Joint Exercises Straight Leg Raises: AROM;Right;5 reps Long Arc Quad: AROM;Right;5 reps   Assessment/Plan    PT Assessment Patient  needs continued PT services  PT Problem List Decreased strength;Decreased range of motion;Decreased activity tolerance;Decreased mobility;Decreased knowledge of precautions;Decreased safety awareness;Decreased knowledge of use of DME;Pain       PT Treatment Interventions DME instruction;Gait training;Functional mobility training;Patient/family education;Therapeutic activities;Therapeutic exercise    PT Goals (Current goals can be found in the Care Plan section)  Acute Rehab PT Goals Patient Stated Goal: to go on a trip to [place] PT Goal Formulation: With patient/family Time For Goal Achievement: 06/25/18 Potential to Achieve Goals: Good    Frequency 7X/week    Barriers to discharge        Co-evaluation               AM-PAC PT "6 Clicks" Mobility  Outcome Measure Help needed turning from your back to your side while in a flat bed without using bedrails?: A Little Help needed moving from lying on your back to sitting on the side of a flat bed without using bedrails?: A Little Help needed moving to and from a bed to a chair (including a wheelchair)?: A Lot Help needed standing up from a chair using your arms (e.g., wheelchair or bedside chair)?: A Lot Help needed to walk in hospital room?: A Lot Help needed climbing 3-5 steps with a railing? : Total 6 Click Score: 13    End of Session Equipment Utilized During Treatment: Gait belt Activity Tolerance: Patient tolerated treatment well Patient left: in chair;with call bell/phone within reach;with family/visitor present Nurse Communication: Mobility status PT Visit Diagnosis: Unsteadiness on feet (R26.81)    Time: 3614-4315 PT Time Calculation (min) (ACUTE ONLY): 37 min   Charges:   PT Evaluation $PT Eval Low Complexity: 1 Low PT Treatments $Gait Training: 8-22 mins        [place] Pager [phone] Office [phone]   Claretha Cooper 06/18/2018, 4:53 PM

## 2018-06-18 NOTE — Anesthesia Procedure Notes (Signed)
Procedure Name: MAC Date/Time: 06/18/2018 7:30 AM Performed by: Lissa Morales, CRNA Pre-anesthesia Checklist: Patient identified, Emergency Drugs available, Suction available, Patient being monitored and Timeout performed Patient Re-evaluated:Patient Re-evaluated prior to induction Oxygen Delivery Method: Simple face mask Placement Confirmation: positive ETCO2

## 2018-06-18 NOTE — Op Note (Signed)
06/18/2018  9:17 AM  PATIENT:  [name]    PRE-OPERATIVE DIAGNOSIS: Right anteromedial knee osteoarthritis  POST-OPERATIVE DIAGNOSIS:  Same  PROCEDURE:  Unicompartmental Knee Arthroplasty  SURGEON:  Johnny Bridge, MD  PHYSICIAN ASSISTANT: Joya Gaskins, OPA-C, present and scrubbed throughout the case, critical for completion in a timely fashion, and for retraction, instrumentation, and closure.  ANESTHESIA:   Spinal with abductor canal block with intra-articular injection of 30 mL of Marcaine and Toradol  ESTIMATED BLOOD LOSS: 100 mL  UNIQUE ASPECTS OF THE CASE: She had a fair amount of tibial osteophyte, she also had significant rimming medial femoral osteophyte.  PREOPERATIVE INDICATIONS:  [name] is a  83 y.o. female with a diagnosis of DJD RIGHT KNEE who failed conservative measures and elected for surgical management.    The risks benefits and alternatives were discussed with the patient preoperatively including but not limited to the risks of infection, bleeding, nerve injury, cardiopulmonary complications, blood clots, the need for revision surgery, among others, and the patient was willing to proceed.  OPERATIVE IMPLANTS: Biomet [place] mobile bearing medial compartment arthroplasty femur size medium, tibia size B, bearing size 5.  OPERATIVE FINDINGS: Endstage grade 4 medial compartment osteoarthritis. No significant changes in the lateral or patellofemoral joint.  The ACL was intact.  OPERATIVE PROCEDURE: The patient was brought to the operating room placed in the supine position. Spinal anesthesia was administered. IV antibiotics were given. The lower extremity was placed in the legholder and prepped and draped in usual sterile fashion.   Time out was performed.  The leg was elevated and exsanguinated and the tourniquet was inflated. Anteromedial incision was performed, and I took care to preserve the MCL. Parapatellar incision was carried out, and the osteophytes  were excised, along with the medial meniscus and a small portion of the fat pad.  The extra medullary tibial cutting jig was applied, using the spoon and the 36mm G-Clamp and the 2 mm shim, and I took care to protect the anterior cruciate ligament insertion and the tibial spine. The medial collateral ligament was also protected, and I resected my proximal tibia, matching the anatomic slope.   The proximal tibial bony cut was removed in one piece, and I turned my attention to the femur.  The intramedullary femoral rod was placed using the drill, and then using the appropriate reference, I assembled the femoral jig, setting my posterior cutting block. I resected my posterior femur, used the 0 spigot for the anterior femur, and then measured my gap.   I then used the appropriate mill to match the extension gap to the flexion gap. The second milling was at a 3.  The gaps were then measured again with the appropriate feeler gauges. Once I had balanced flexion and extension gaps, I then completed the preparation of the femur.  I milled off the anterior aspect of the distal femur to prevent impingement. I also exposed the tibia, and selected the above-named component, and then used the cutting jig to prepare the keel slot on the tibia. I also used the awl to curette out the bone to complete the preparation of the keel. The back wall was intact.  I then placed trial components, and it was found to have excellent motion, and appropriate balance.  I then cemented the components into place, cementing the tibia first, removing all excess cement, and then cementing the femur.  All loose cement was removed.  The real polyethylene insert was applied manually, and the knee  was taken through functional range of motion, and found to have excellent stability and restoration of joint motion, with excellent balance.  The wounds were irrigated copiously, and the parapatellar tissue closed with Vicryl, followed by Vicryl  for the subcutaneous tissue, with routine closure with Steri-Strips and sterile gauze.  The tourniquet was released, and the patient was awakened and extubated and returned to PACU in stable and satisfactory condition. There were no complications.

## 2018-06-18 NOTE — H&P (Signed)
PREOPERATIVE H&P  Chief Complaint: Right knee pain  HPI: [name] is a 83 y.o. female who presents for preoperative history and physical with a diagnosis of right knee anteromedial osteoarthritis. Symptoms are rated as moderate to severe, and have been worsening.  This is significantly impairing activities of daily living.  She has elected for surgical management.   She has failed injections, activity modification, anti-inflammatories, and assistive devices.  Preoperative X-rays demonstrate end stage degenerative changes with osteophyte formation, loss of joint space, subchondral sclerosis.   Past Medical History:  Diagnosis Date  . Arthritis   . Asthma   . Chronic sinusitis   . Complication of anesthesia   . Family history of adverse reaction to anesthesia    sister is hard to wake up from Anesthesia  . Follicular lymphoma ([place])    received Chemotherapy (2 years ago)  and radiation (last 12/2017)  . Hyperlipidemia   . Hypertension   . Hypothyroidism   . Meniere's disease   . PONV (postoperative nausea and vomiting)   . Reflux    Past Surgical History:  Procedure Laterality Date  . ABDOMINAL HYSTERECTOMY    . BONE MARROW BIOPSY  06/16/2014   at [facility]     bilateral  . CHOLECYSTECTOMY    . KNEE ARTHROSCOPY     Social History   Socioeconomic History  . Marital status: Single    Spouse name: Not on file  . Number of children: Not on file  . Years of education: Not on file  . Highest education level: Not on file  Occupational History  . Not on file  Social Needs  . Financial resource strain: Not on file  . Food insecurity:    Worry: Not on file    Inability: Not on file  . Transportation needs:    Medical: Not on file    Non-medical: Not on file  Tobacco Use  . Smoking status: Never Smoker  . Smokeless tobacco: Never Used  Substance and Sexual Activity  . Alcohol use: No  . Drug use: No  . Sexual activity: Not on file   Lifestyle  . Physical activity:    Days per week: Not on file    Minutes per session: Not on file  . Stress: Not on file  Relationships  . Social connections:    Talks on phone: Not on file    Gets together: Not on file    Attends religious service: Not on file    Active member of club or organization: Not on file    Attends meetings of clubs or organizations: Not on file    Relationship status: Not on file  Other Topics Concern  . Not on file  Social History Narrative  . Not on file   Family History  Problem Relation Age of Onset  . Heart disease Other    No Known Allergies Prior to Admission medications   Medication Sig Start Date End Date Taking? Authorizing Provider  acyclovir ointment (ZOVIRAX) 5 % Apply 1 application topically daily as needed (for cold sores).   Yes [provider]  amLODipine (NORVASC) 5 MG tablet Take 5 mg by mouth daily.   Yes [provider]  aspirin EC 81 MG tablet Take 81 mg by mouth daily.   Yes [provider]  CALCIUM-MAGNESIUM-ZINC PO Take 1 tablet by mouth daily.   Yes [provider]  fluticasone (FLOVENT HFA) 110 MCG/ACT inhaler Inhale 2 puffs into  the lungs 2 (two) times daily as needed (for shortness of breath or wheezing).    Yes [provider]  hydrochlorothiazide (HYDRODIURIL) 25 MG tablet Take 25 mg by mouth daily.   Yes [provider]  isosorbide mononitrate (IMDUR) 30 MG 24 hr tablet Take 30 mg by mouth daily.   Yes [provider]  levothyroxine (SYNTHROID, LEVOTHROID) 88 MCG tablet Take 88 mcg by mouth daily before breakfast.   Yes [provider]  losartan (COZAAR) 100 MG tablet Take 100 mg by mouth at bedtime.    Yes [provider]  metoprolol (LOPRESSOR) 100 MG tablet Take 100 mg by mouth 2 (two) times daily.   Yes [provider]  omeprazole (PRILOSEC) 40 MG capsule Take 40 mg by mouth 2 (two) times daily.    Yes [provider]   pravastatin (PRAVACHOL) 80 MG tablet Take 80 mg by mouth at bedtime.    Yes [provider]  tetrahydrozoline (VISINE) 0.05 % ophthalmic solution Place 2 drops into both eyes daily as needed (for dry eyes).   Yes [provider]  triamcinolone (NASACORT ALLERGY 24HR) 55 MCG/ACT AERO nasal inhaler Place 2 sprays into the nose daily.   Yes [provider]  Spacer/Aero-Holding Chambers (AEROCHAMBER PLUS FLO-VU MEDIUM) MISC Use with Flovent inhaler Patient not taking: Reported on 05/29/2018 06/24/13   Kandra Nicolas, MD     Positive ROS: All other systems have been reviewed and were otherwise negative with the exception of those mentioned in the HPI and as above.  Physical Exam: General: Alert, no acute distress Cardiovascular: No pedal edema Respiratory: No cyanosis, no use of accessory musculature GI: No organomegaly, abdomen is soft and non-tender Skin: No lesions in the area of chief complaint Neurologic: Sensation intact distally Psychiatric: Patient is competent for consent with normal mood and affect Lymphatic: No axillary or cervical lymphadenopathy  MUSCULOSKELETAL: Right knee has varus alignment with pseudolaxity range of motion 0 to 115 degrees positive pain medially  Assessment: Right knee anteromedial osteoarthritis   Plan: Plan for Procedure(s): UNICOMPARTMENTAL KNEE  The risks benefits and alternatives were discussed with the patient including but not limited to the risks of nonoperative treatment, versus surgical intervention including infection, bleeding, nerve injury,  blood clots, cardiopulmonary complications, morbidity, mortality, among others, and they were willing to proceed.    Patient's anticipated LOS is less than 2 midnights, meeting these requirements: - Younger than 24 - Lives within 1 hour of care - Has a competent adult at home to recover with post-op recover - NO history of  - Chronic pain requiring opiods  -  Diabetes  - Coronary Artery Disease  - Heart failure  - Heart attack  - Stroke  - DVT/VTE  - Cardiac arrhythmia  - Respiratory Failure/COPD  - Renal failure  - Anemia  - Advanced Liver disease      Plan for aspirin for DVT prophylaxis, Foley intraoperatively, trans-examined acid, discussed with family.  Johnny Bridge, MD Cell [phone]   06/18/2018 7:19 AM

## 2018-06-18 NOTE — Plan of Care (Signed)
Plan of care 

## 2018-06-19 ENCOUNTER — Encounter (HOSPITAL_COMMUNITY): Payer: Self-pay | Admitting: Orthopedic Surgery

## 2018-06-19 DIAGNOSIS — Z79899 Other long term (current) drug therapy: Secondary | ICD-10-CM | POA: Diagnosis not present

## 2018-06-19 DIAGNOSIS — M1711 Unilateral primary osteoarthritis, right knee: Secondary | ICD-10-CM | POA: Diagnosis not present

## 2018-06-19 DIAGNOSIS — I10 Essential (primary) hypertension: Secondary | ICD-10-CM | POA: Diagnosis not present

## 2018-06-19 DIAGNOSIS — E039 Hypothyroidism, unspecified: Secondary | ICD-10-CM | POA: Diagnosis not present

## 2018-06-19 DIAGNOSIS — E785 Hyperlipidemia, unspecified: Secondary | ICD-10-CM | POA: Diagnosis not present

## 2018-06-19 DIAGNOSIS — Z7982 Long term (current) use of aspirin: Secondary | ICD-10-CM | POA: Diagnosis not present

## 2018-06-19 LAB — CBC
HCT: 34.8 % — ABNORMAL LOW (ref 36.0–46.0)
Hemoglobin: 11.2 g/dL — ABNORMAL LOW (ref 12.0–15.0)
MCH: 29.4 pg (ref 26.0–34.0)
MCHC: 32.2 g/dL (ref 30.0–36.0)
MCV: 91.3 fL (ref 80.0–100.0)
Platelets: 194 10*3/uL (ref 150–400)
RBC: 3.81 MIL/uL — ABNORMAL LOW (ref 3.87–5.11)
RDW: 12.9 % (ref 11.5–15.5)
WBC: 7.6 10*3/uL (ref 4.0–10.5)
nRBC: 0 % (ref 0.0–0.2)

## 2018-06-19 LAB — BASIC METABOLIC PANEL
Anion gap: 6 (ref 5–15)
BUN: 25 mg/dL — ABNORMAL HIGH (ref 8–23)
CO2: 27 mmol/L (ref 22–32)
Calcium: 8.6 mg/dL — ABNORMAL LOW (ref 8.9–10.3)
Chloride: 101 mmol/L (ref 98–111)
Creatinine, Ser: 0.99 mg/dL (ref 0.44–1.00)
GFR calc Af Amer: >60 mL/min (ref >60)
GFR calc non Af Amer: 53 mL/min — ABNORMAL LOW (ref >60)
Glucose, Bld: 120 mg/dL — ABNORMAL HIGH (ref 70–99)
Potassium: 4.2 mmol/L (ref 3.5–5.1)
Sodium: 134 mmol/L — ABNORMAL LOW (ref 135–145)

## 2018-06-19 MED ORDER — HYDRALAZINE HCL 20 MG/ML IJ SOLN
2.0000 mg | Freq: Once | INTRAMUSCULAR | Status: AC | PRN
  Administered 2018-06-19: 2 mg via INTRAVENOUS
  Filled 2018-06-19: qty 1

## 2018-06-19 NOTE — Progress Notes (Signed)
Physical Therapy Treatment Patient Details Name: Waynette Towers MRN: [id number] DOB: 1934/08/04 Today's Date: 06/19/2018    History of Present Illness R unilateral knee arthroplasty.    PT Comments    Patient reports no pain. BP 197/77post therapy. RN aware. Continue PT  Follow Up Recommendations  Follow surgeon's recommendation for DC plan and follow-up therapies     Equipment Recommendations  Rolling walker with 5" wheels(rollator not safe)    Recommendations for Other Services       Precautions / Restrictions Precautions Precautions: Knee;Fall Precaution Comments: did not wear this vist,     Mobility  Bed Mobility               General bed mobility comments: in recliner  Transfers   Equipment used: Rolling walker (2 wheeled) Transfers: Sit to/from Stand Sit to Stand: Min guard         General transfer comment: cues for hand and right leg position  Ambulation/Gait Ambulation/Gait assistance: Min guard Gait Distance (Feet): 100 Feet Assistive device: Rolling walker (2 wheeled) Gait Pattern/deviations: Step-to pattern;Step-through pattern     General Gait Details: cues for sequence   Stairs             Wheelchair Mobility    Modified Rankin (Stroke Patients Only)       Balance                                            Cognition Arousal/Alertness: Awake/alert                                            Exercises Total Joint Exercises Ankle Circles/Pumps: AROM;Both;10 reps Quad Sets: AROM;Both;10 reps Short Arc Quad: AROM;Right;10 reps Hip ABduction/ADduction: AAROM;Right;10 reps Straight Leg Raises: AAROM;Right;10 reps Long Arc Quad: AAROM;10 reps Goniometric ROM: 50-90 right knee flexion    General Comments        Pertinent Vitals/Pain Pain Score: 0-No pain    Home Living                      Prior Function            PT Goals (current goals can now be found in the  care plan section) Progress towards PT goals: Progressing toward goals    Frequency    7X/week      PT Plan Current plan remains appropriate    Co-evaluation              AM-PAC PT "6 Clicks" Mobility   Outcome Measure  Help needed turning from your back to your side while in a flat bed without using bedrails?: A Little Help needed moving from lying on your back to sitting on the side of a flat bed without using bedrails?: A Little Help needed moving to and from a bed to a chair (including a wheelchair)?: A Little Help needed standing up from a chair using your arms (e.g., wheelchair or bedside chair)?: A Little Help needed to walk in hospital room?: A Little Help needed climbing 3-5 steps with a railing? : A Lot 6 Click Score: 17    End of Session   Activity Tolerance: Patient tolerated treatment well     PT Visit Diagnosis: Unsteadiness on feet (  R26.81)     Time: 4888-9169 PT Time Calculation (min) (ACUTE ONLY): 43 min  Charges:  $Gait Training: 8-22 mins $Therapeutic Exercise: 8-22 mins $Self Care/Home Management: [place] Pager [phone] Office [phone]    Claretha Cooper 06/19/2018, 11:54 AM

## 2018-06-19 NOTE — Progress Notes (Addendum)
Patient ID: [name], female   DOB: 12-30-1934, 84 y.o.   MRN: [id number]     Subjective:  Patient reports pain as mild to moderate.  Patient complaining of cramps more than pain denies CP or SOB  Objective:   VITALS:   Vitals:   06/19/18 0103 06/19/18 0518 06/19/18 0604 06/19/18 0751  BP: (!) 168/73 (!) 199/84 (!) 200/86 (!) 211/82  Pulse: 62 61    Resp: 16 18    Temp: (!) 97.5 F (36.4 C) 97.9 F (36.6 C)    TempSrc: Oral Oral    SpO2: 96% 95%    Weight:      Height:        ABD soft Sensation intact distally Dorsiflexion/Plantar flexion intact Incision: dressing C/D/I and no drainage   Lab Results  Component Value Date   WBC 7.6 06/19/2018   HGB 11.2 (L) 06/19/2018   HCT 34.8 (L) 06/19/2018   MCV 91.3 06/19/2018   PLT 194 06/19/2018   BMET    Component Value Date/Time   NA 134 (L) 06/19/2018 0420   K 4.2 06/19/2018 0420   CL 101 06/19/2018 0420   CO2 27 06/19/2018 0420   GLUCOSE 120 (H) 06/19/2018 0420   BUN 25 (H) 06/19/2018 0420   CREATININE 0.99 06/19/2018 0420   CALCIUM 8.6 (L) 06/19/2018 0420   GFRNONAA 53 (L) 06/19/2018 0420   GFRAA >60 06/19/2018 0420     Assessment/Plan: 1 Day Post-Op   Principal Problem:   Primary localized osteoarthritis of right knee Active Problems:   Status post right partial knee replacement   Advance diet Up with therapy Continue to monitor the BP WBAT Dry dressing PRN    Lunette Stands 06/19/2018, 8:21 AM  Discussed and agree with above.  Consider dc home today if passes PT and safe discharge plan.   Marchia Bond, MD Cell [phone]

## 2018-06-19 NOTE — Care Management Obs Status (Signed)
[facility] NOTIFICATION   Patient Details  Name: Dayleen Beske MRN: [id number] Date of Birth: [birth year]   Medicare Observation Status Notification Given:  Yes    Guadalupe Maple, RN 06/19/2018, 10:43 AM

## 2018-06-19 NOTE — Discharge Summary (Signed)
Physician Discharge Summary  Patient ID: [name] MRN: [id number] DOB/AGE: Jun 18, 1935 83 y.o.  Admit date: 06/18/2018 Discharge date: 06/19/2018  Admission Diagnoses:  Primary localized osteoarthritis of right knee  Discharge Diagnoses:  Principal Problem:   Primary localized osteoarthritis of right knee Active Problems:   Status post right partial knee replacement   Past Medical History:  Diagnosis Date  . Arthritis   . Asthma   . Chronic sinusitis   . Complication of anesthesia   . Family history of adverse reaction to anesthesia    sister is hard to wake up from Anesthesia  . Follicular lymphoma ([place])    received Chemotherapy (2 years ago)  and radiation (last 12/2017)  . Hyperlipidemia   . Hypertension   . Hypothyroidism   . Meniere's disease   . PONV (postoperative nausea and vomiting)   . Primary localized osteoarthritis of right knee 06/18/2018  . Reflux     Surgeries: Procedure(s): UNICOMPARTMENTAL KNEE on 06/18/2018   Consultants (if any):   Discharged Condition: Improved  Hospital Course: [name] is an 83 y.o. female who was admitted 06/18/2018 with a diagnosis of Primary localized osteoarthritis of right knee and went to the operating room on 06/18/2018 and underwent the above named procedures.    She was given perioperative antibiotics:  Anti-infectives (From admission, onward)   Start     Dose/Rate Route Frequency Ordered Stop   06/18/18 1400  ceFAZolin (ANCEF) IVPB 2g/100 mL premix     2 g 200 mL/hr over 30 Minutes Intravenous Every 6 hours 06/18/18 1158 06/18/18 1945   06/18/18 0600  ceFAZolin (ANCEF) IVPB 2g/100 mL premix     2 g 200 mL/hr over 30 Minutes Intravenous On call to O.R. 06/18/18 3614 06/18/18 0733    .  She was given sequential compression devices, early ambulation, and aspirin for DVT prophylaxis.  She had hypertension as well, treated with her meds and IV hydralazine with follow up with her PMD.  She was fairly  asymptomatic.    She benefited maximally from the hospital stay and there were no complications.    Recent vital signs:  Vitals:   06/19/18 1106 06/19/18 1202  BP: (!) 184/67 (!) 174/78  Pulse: 63 64  Resp:    Temp:    SpO2:  94%    Recent laboratory studies:  Lab Results  Component Value Date   HGB 11.2 (L) 06/19/2018   HGB 13.3 06/04/2018   Lab Results  Component Value Date   WBC 7.6 06/19/2018   PLT 194 06/19/2018   No results found for: INR Lab Results  Component Value Date   NA 134 (L) 06/19/2018   K 4.2 06/19/2018   CL 101 06/19/2018   CO2 27 06/19/2018   BUN 25 (H) 06/19/2018   CREATININE 0.99 06/19/2018   GLUCOSE 120 (H) 06/19/2018    Discharge Medications:   Allergies as of 06/19/2018   No Known Allergies     Medication List    TAKE these medications   acyclovir ointment 5 % Commonly known as:  ZOVIRAX Apply 1 application topically daily as needed (for cold sores).   AEROCHAMBER PLUS FLO-VU MEDIUM Misc Use with Flovent inhaler   amLODipine 5 MG tablet Commonly known as:  NORVASC Take 5 mg by mouth daily.   aspirin EC 325 MG tablet Take 1 tablet (325 mg total) by mouth 2 (two) times daily. What changed:    medication strength  how much to take  when to take  this   baclofen 10 MG tablet Commonly known as:  LIORESAL Take 1 tablet (10 mg total) by mouth 3 (three) times daily. As needed for muscle spasm   CALCIUM-MAGNESIUM-ZINC PO Take 1 tablet by mouth daily.   fluticasone 110 MCG/ACT inhaler Commonly known as:  FLOVENT HFA Inhale 2 puffs into the lungs 2 (two) times daily as needed (for shortness of breath or wheezing).   hydrochlorothiazide 25 MG tablet Commonly known as:  HYDRODIURIL Take 25 mg by mouth daily.   HYDROcodone-acetaminophen 10-325 MG tablet Commonly known as:  NORCO Take 1 tablet by mouth every 6 (six) hours as needed.   isosorbide mononitrate 30 MG 24 hr tablet Commonly known as:  IMDUR Take 30 mg by mouth  daily.   levothyroxine 88 MCG tablet Commonly known as:  SYNTHROID, LEVOTHROID Take 88 mcg by mouth daily before breakfast.   losartan 100 MG tablet Commonly known as:  COZAAR Take 100 mg by mouth at bedtime.   metoprolol tartrate 100 MG tablet Commonly known as:  LOPRESSOR Take 100 mg by mouth 2 (two) times daily.   NASACORT ALLERGY 24HR 55 MCG/ACT Aero nasal inhaler Generic drug:  triamcinolone Place 2 sprays into the nose daily.   omeprazole 40 MG capsule Commonly known as:  PRILOSEC Take 40 mg by mouth 2 (two) times daily.   ondansetron 4 MG tablet Commonly known as:  ZOFRAN Take 1 tablet (4 mg total) by mouth every 8 (eight) hours as needed for nausea or vomiting.   pravastatin 80 MG tablet Commonly known as:  PRAVACHOL Take 80 mg by mouth at bedtime.   sennosides-docusate sodium 8.6-50 MG tablet Commonly known as:  SENOKOT-S Take 2 tablets by mouth daily.   VISINE 0.05 % ophthalmic solution Generic drug:  tetrahydrozoline Place 2 drops into both eyes daily as needed (for dry eyes).       Diagnostic Studies: Dg Knee Right Port  Result Date: 06/18/2018 CLINICAL DATA:  Osteoarthritis of the medial compartment of the right knee. Status post unicompartmental knee arthroplasty. EXAM: PORTABLE RIGHT KNEE - 1-2 VIEW COMPARISON:  None. FINDINGS: The components of the right medial compartment arthroplasty appear in good position. No fractures. Fluid and air in the joint to the expected degree after surgery. IMPRESSION: Satisfactory appearance of the right knee after medial compartment arthroplasty. Electronically Signed   By: Lorriane Shire M.D.   On: 06/18/2018 10:38    Disposition:     Follow-up Information    Marchia Bond, MD. Schedule an appointment as soon as possible for a visit in 2 weeks.   Specialty:  Orthopedic Surgery Contact information: [address] [place] [address] [phone]            Signed: Johnny Bridge 06/19/2018, 5:18 PM

## 2018-06-19 NOTE — Care Management Note (Signed)
Case Management Note  Patient Details  Name: [name] MRN: [id number] Date of Birth: Feb 09, 1935  Subjective/Objective:    Spoke with patient at bedside. Confirmed plan for OP PT. Needs a RW. [phone]                Action/Plan: Contacted [facility] to deliver to the room.  Expected Discharge Date:                  Expected Discharge Plan:  OP Rehab  In-House Referral:  NA  Discharge planning Services  CM Consult  Post Acute Care Choice:  Durable Medical Equipment Choice offered to:  Patient, Adult Children  DME Arranged:  Walker rolling DME Agency:  [place]:  NA [place] Agency:  NA  Status of Service:  Completed, signed off  If discussed at [place] of Stay Meetings, dates discussed:    Additional Comments:  Guadalupe Maple, RN 06/19/2018, 10:44 AM

## 2018-06-19 NOTE — Progress Notes (Addendum)
Physical Therapy Treatment Patient Details Name: [name] MRN: [id number] DOB: 05-15-1935 Today's Date: 06/19/2018    History of Present Illness R unilateral knee arthroplasty.    PT Comments    Patient is progressing well.  Follow Up Recommendations  Follow surgeon's recommendation for DC plan and follow-up therapies     Equipment Recommendations  Rolling walker with 5" wheels    Recommendations for Other Services       Precautions / Restrictions Precautions Precautions: Knee;Fall Precaution Comments: did not wear this vist,     Mobility  Bed Mobility               General bed mobility comments: in BR  Transfers   Equipment used: Rolling walker (2 wheeled) Transfers: Sit to/from Stand Sit to Stand: Supervision         General transfer comment: cues for hand and right leg position  Ambulation/Gait Ambulation/Gait assistance: Supervision Gait Distance (Feet): 50 Feet Assistive device: Rolling walker (2 wheeled) Gait Pattern/deviations: Step-to pattern;Step-through pattern     General Gait Details: cues for sequence   Stairs             Wheelchair Mobility    Modified Rankin (Stroke Patients Only)       Balance                                            Cognition Arousal/Alertness: Awake/alert                                            Exercises Total Joint Exercises Ankle Circles/Pumps: AROM;Both;10 reps Quad Sets: AROM;Both;10 reps Short Arc Quad: AROM;Right;10 reps Hip ABduction/ADduction: AAROM;Right;10 reps Straight Leg Raises: AAROM;Right;10 reps Long Arc Quad: AAROM;10 reps Goniometric ROM: 50-90 right knee flexion    General Comments        Pertinent Vitals/Pain Pain Score: 0-No pain    Home Living                      Prior Function            PT Goals (current goals can now be found in the care plan section) Progress towards PT goals: Progressing toward  goals    Frequency    7X/week      PT Plan Current plan remains appropriate    Co-evaluation              AM-PAC PT "6 Clicks" Mobility   Outcome Measure  Help needed turning from your back to your side while in a flat bed without using bedrails?: A Little Help needed moving from lying on your back to sitting on the side of a flat bed without using bedrails?: A Little Help needed moving to and from a bed to a chair (including a wheelchair)?: A Little Help needed standing up from a chair using your arms (e.g., wheelchair or bedside chair)?: A Little Help needed to walk in hospital room?: A Little Help needed climbing 3-5 steps with a railing? : A Lot 6 Click Score: 17    End of Session   Activity Tolerance: Patient tolerated treatment well Patient left: in chair;with call bell/phone within reach;with family/visitor present Nurse Communication: Mobility status PT Visit Diagnosis: Unsteadiness on feet (R26.81)  Time: 1010-1020 PT Time Calculation (min) (ACUTE ONLY): 10 min  Charges:  $Gait Training: 8-22 mins                      [place] Pager [phone] Office [phone]    Claretha Cooper 06/19/2018, 11:59 AM

## 2018-06-19 NOTE — Progress Notes (Addendum)
During shift report from [place], night shift RN, she reported the pt's BP of 200/82. Livia Snellen reported giving the scheduled Norvasc early.   At 0751, the pt's BP was 211/82. Her scheduled BP meds were given. At 0853, the pt's BP was 193/73. Pt reported a pain level of 2. Mardelle Matte, MD was paged regarding the hypertension. Landau gave verbal orders for IV Hydralazine 2 mg PRN and give 2 additional mg if initial dose is not effective. Landau advised that pt will be able to be d/c home as long as she does well with PT regardless of her BP.  At 1106, pt's BP was 184/67. An additional 2 mg of Hydralazine will be given and BP reassessed.

## 2018-06-24 ENCOUNTER — Encounter: Payer: Self-pay | Admitting: Rehabilitative and Restorative Service Providers"

## 2018-06-24 ENCOUNTER — Ambulatory Visit (INDEPENDENT_AMBULATORY_CARE_PROVIDER_SITE_OTHER): Payer: Medicare Other | Admitting: Rehabilitative and Restorative Service Providers"

## 2018-06-24 ENCOUNTER — Other Ambulatory Visit: Payer: Self-pay

## 2018-06-24 DIAGNOSIS — M25561 Pain in right knee: Secondary | ICD-10-CM | POA: Diagnosis not present

## 2018-06-24 DIAGNOSIS — R29898 Other symptoms and signs involving the musculoskeletal system: Secondary | ICD-10-CM

## 2018-06-24 DIAGNOSIS — R2689 Other abnormalities of gait and mobility: Secondary | ICD-10-CM

## 2018-06-24 DIAGNOSIS — M6281 Muscle weakness (generalized): Secondary | ICD-10-CM | POA: Diagnosis not present

## 2018-06-24 NOTE — Therapy (Signed)
[facility] [place], [name], [address] Phone: [phone]   Fax:  [phone]  Physical Therapy Evaluation  Patient Details  Name: [name] MRN: [id number] Date of Birth: 04-Feb-1935 Referring Provider (PT): Dr Carter Kitten    Encounter Date: 06/24/2018  PT End of Session - 06/24/18 1107    Visit Number  1    Number of Visits  24    Date for PT Re-Evaluation  09/23/18    PT Start Time  1104    PT Stop Time  1200    PT Time Calculation (min)  56 min    Activity Tolerance  Patient tolerated treatment well       Past Medical History:  Diagnosis Date  . Arthritis   . Asthma   . Chronic sinusitis   . Complication of anesthesia   . Family history of adverse reaction to anesthesia    sister is hard to wake up from Anesthesia  . Follicular lymphoma ([place])    received Chemotherapy (2 years ago)  and radiation (last 12/2017)  . Hyperlipidemia   . Hypertension   . Hypothyroidism   . Meniere's disease   . PONV (postoperative nausea and vomiting)   . Primary localized osteoarthritis of right knee 06/18/2018  . Reflux     Past Surgical History:  Procedure Laterality Date  . ABDOMINAL HYSTERECTOMY    . BONE MARROW BIOPSY  06/16/2014   at [facility]     bilateral  . CHOLECYSTECTOMY    . KNEE ARTHROSCOPY    . PARTIAL KNEE ARTHROPLASTY Right 06/18/2018   Procedure: UNICOMPARTMENTAL KNEE;  Surgeon: Marchia Bond, MD;  Location: [facility];  Service: Orthopedics;  Laterality: Right;    There were no vitals filed for this visit.   Subjective Assessment - 06/24/18 1117    Subjective  Patient reports thatshe has had Rt knee pain for years progressively worsening in the past year. Underwent Rt partial knee replacement 06/18/18.    Pertinent History  History of arthritis; Lt scope 2008; being treated for Follicular lymphoma x 3 yrs - with radiation and chemo - under control; HTN;  a-fib; SOB possibly from a-fib; HOH Lt > Rt ear     Diagnostic tests  xrays     Patient Stated Goals  walk normal again; get in and out of chair and on off toilet more easily     Currently in Pain?  Yes    Pain Score  7    with pain medication    Pain Location  Knee    Pain Orientation  Right    Pain Descriptors / Indicators  Dull;Aching    Pain Type  Surgical pain;Chronic pain    Pain Onset  In the past 7 days    Pain Frequency  Intermittent    Aggravating Factors   increased activity     Pain Relieving Factors  meds; decreased activity          [facility] PT Assessment - 06/24/18 0001      Assessment   Medical Diagnosis  Rt partial knee replacement     Referring Provider (PT)  Dr Carter Kitten     Onset Date/Surgical Date  06/18/18    Hand Dominance  Right    Next MD Visit  07/01/18 9:30 am     Prior Therapy  in hospital       Precautions   Precautions  None  Precaution Comments  NO MODALITIES d/t HX of Cancer       Restrictions   Weight Bearing Restrictions  No      Balance Screen   Has the patient fallen in the past 6 months  No    Has the patient had a decrease in activity level because of a fear of falling?   No    Is the patient reluctant to leave their home because of a fear of falling?   No      Home Environment   Living Environment  Private residence    Living Arrangements  Children   daughter [name]    Home Access  Stairs to enter    Entrance Stairs-Number of Steps  1    Home Layout  One level    Home Equipment  Walker - 2 wheels;Toilet riser;Shower seat      Prior Function   Level of Independence  Independent    Vocation  Retired    Vocation Requirements  dental field retired 3 yrs     Leisure  household chores; cooking; some yard work before this year       Observation/Other Assessments   Observations  Rt knee w/ post op dressing in place - dry and intact     Skin Integrity  some discoloratiion/bruising posterior and medial Rt knee     Focus on  Therapeutic Outcomes (FOTO)   82% limitation       Observation/Other Assessments-Edema    Edema  --   edema Rt knee      Sensation   Additional Comments  numbness lateral knee       Posture/Postural Control   Posture Comments  forward flexed posture at hips       AROM   Right/Left Hip  --   tight end ranges throughout bilat hips    Right Knee Extension  -9    Left Knee Extension  -4    Left Knee Flexion  115      Strength   Right/Left Hip  --   Rt LE not tested due to post op pain    Left Hip Flexion  4+/5    Left Hip Extension  4/5    Left Hip ABduction  4/5    Left Hip ADduction  4+/5    Right/Left Knee  --   Rt knee not tested due to post op pain    Left Knee Flexion  5/5    Left Knee Extension  5/5      Flexibility   Hamstrings  tight Rt > Lt    Quadriceps  tight Rt > Lt     ITB  tight Rt > Lt     Piriformis  tight Rt > Lt      Transfers   Comments  some difficulty with all transfers and transitional movements       Ambulation/Gait   Gait Comments  ambulates with rolling walker abnormal gait pattern gait pattern decreased wt bearing phase Rt; step to with Lt LE - walker adjusted to proper heigthwith iniital instructions in gait pattern- walker was too low for pt)                 Objective measurements completed on examination: See above findings.      [facility] Adult PT Treatment/Exercise - 06/24/18 0001      Self-Care   Self-Care  --   desentization activities for numbness Lateral Rt knee     Knee/Hip   Exercises: Supine   Quad Sets  AROM;Right   2 reps straightening Rt knee for ROM measurement   Heel Slides  AAROM;Right;5 reps   pt assisting with strap      Moist Heat Therapy   Number Minutes Moist Heat  15 Minutes    Moist Heat Location  Lumbar Spine   thoracic to upper lumbar - for cramping in the back      Vasopneumatic   Number Minutes Vasopneumatic   15 minutes    Vasopnuematic Location   Knee   Rt   Vasopneumatic Pressure  Low     Vasopneumatic Temperature   34             PT Education - 06/24/18 1259    Education Details  POC; gait training; ROM - bending and straightening knee - continue w/ hospital instructions    Person(s) Educated  Patient    Methods  Explanation;Demonstration;Tactile cues;Verbal cues    Comprehension  Verbalized understanding;Returned demonstration;Verbal cues required;Tactile cues required;Need further instruction       PT Short Term Goals - 06/24/18 1354      PT SHORT TERM GOAL #1   Title  Independent in initial HEP 08/05/18    Time  6    Period  Weeks    Status  New      PT SHORT TERM GOAL #2   Title  Abulation with least assistive device with good gait pattern 08/05/18    Time  6    Period  Weeks    Status  New        PT Long Term Goals - 06/24/18 1359      PT LONG TERM GOAL #1   Title  Increased AROM Rt knee 100 deg flexion to 0 deg exension 09/16/18    Time  12    Period  Weeks    Status  New      PT LONG TERM GOAL #2   Title  Increased strength to 4+/5 to 5/5 Rt LE 09/16/18    Time  12    Period  Weeks      PT LONG TERM GOAL #3   Title  Ambulation for community distances without assistive device or [facility] 09/16/18    Time  12    Period  Weeks    Status  New      PT LONG TERM GOAL #4   Title  Independent in HEP 09/16/18    Time  6    Period  Weeks    Status  New      PT LONG TERM GOAL #5   Title  Improve FOTO to </= 56% limitation 09/16/18    Time  6    Period  Weeks    Status  New             Plan - 06/24/18 1348    Clinical Impression Statement  [name] presents s/p Rt partial knee replacement 06/18/18. She has limited ROM; decreased strength; abnormal gait pattern; pain; limited functional activity level. Patient will benefit from PT to address problems identified.     History and Personal Factors relevant to plan of care:  lymphoma x 3 yrs continues to receive chemo every few months; arthritis; a-fib; HOH Lt > Rt ears     Clinical Presentation  Stable     Clinical Decision Making  Low    Rehab Potential  Good    PT Frequency  2x / week  PT Duration  12 weeks    PT Treatment/Interventions  Patient/family education;ADLs/Self Care Home Management;Cryotherapy;Iontophoresis 4mg/ml Dexamethasone;Moist Heat;Scar mobilization;Passive range of motion;Dry needling;Manual techniques;Balance training;Gait training;Neuromuscular re-education;Therapeutic activities;Therapeutic exercise;Functional mobility training    PT Next Visit Plan  progress with ROM and strength exercises; gait training; functional activity and mobiilty; balance activities; heat/ice only     Recommended Other Services  NO MODALITIES     Consulted and Agree with Plan of Care  Patient       Patient will benefit from skilled therapeutic intervention in order to improve the following deficits and impairments:  Improper body mechanics, Pain, Increased muscle spasms, Decreased strength, Decreased mobility, Decreased range of motion, Abnormal gait, Decreased activity tolerance, Decreased balance  Visit Diagnosis: Acute pain of right knee - Plan: PT plan of care cert/re-cert  Other symptoms and signs involving the musculoskeletal system - Plan: PT plan of care cert/re-cert  Muscle weakness (generalized) - Plan: PT plan of care cert/re-cert  Other abnormalities of gait and mobility - Plan: PT plan of care cert/re-cert     Problem List Patient Active Problem List   Diagnosis Date Noted  . Primary localized osteoarthritis of right knee 06/18/2018  . Status post right partial knee replacement 06/18/2018    Celyn P Holt PT, MPH  06/24/2018, 2:09 PM  [facility] [address] [place], [place], [address] Phone: [phone]   Fax:  [phone]  Name: [name] MRN: [id number] Date of Birth: 06/06/1935  

## 2018-06-26 ENCOUNTER — Encounter: Payer: Self-pay | Admitting: Rehabilitative and Restorative Service Providers"

## 2018-06-26 ENCOUNTER — Ambulatory Visit (INDEPENDENT_AMBULATORY_CARE_PROVIDER_SITE_OTHER): Payer: Medicare Other | Admitting: Rehabilitative and Restorative Service Providers"

## 2018-06-26 DIAGNOSIS — R29898 Other symptoms and signs involving the musculoskeletal system: Secondary | ICD-10-CM

## 2018-06-26 DIAGNOSIS — R2689 Other abnormalities of gait and mobility: Secondary | ICD-10-CM

## 2018-06-26 DIAGNOSIS — M6281 Muscle weakness (generalized): Secondary | ICD-10-CM

## 2018-06-26 DIAGNOSIS — M25561 Pain in right knee: Secondary | ICD-10-CM

## 2018-06-26 NOTE — Therapy (Signed)
[name], [name], [address] Phone: [phone]   Fax:  [phone]  Physical Therapy Treatment  Patient Details  Name: [name] MRN: [id number] Date of Birth: 06-17-1935 Referring Provider (PT): Dr Carter Kitten    Encounter Date: 06/26/2018  PT End of Session - 06/26/18 0802    Visit Number  2    Number of Visits  24    Date for PT Re-Evaluation  09/23/18    PT Start Time  0757    PT Stop Time  0855    PT Time Calculation (min)  58 min    Activity Tolerance  Patient tolerated treatment well       Past Medical History:  Diagnosis Date  . Arthritis   . Asthma   . Chronic sinusitis   . Complication of anesthesia   . Family history of adverse reaction to anesthesia    sister is hard to wake up from Anesthesia  . Follicular lymphoma ([place])    received Chemotherapy (2 years ago)  and radiation (last 12/2017)  . Hyperlipidemia   . Hypertension   . Hypothyroidism   . Meniere's disease   . PONV (postoperative nausea and vomiting)   . Primary localized osteoarthritis of right knee 06/18/2018  . Reflux     Past Surgical History:  Procedure Laterality Date  . ABDOMINAL HYSTERECTOMY    . BONE MARROW BIOPSY  06/16/2014   at [facility]     bilateral  . CHOLECYSTECTOMY    . KNEE ARTHROSCOPY    . PARTIAL KNEE ARTHROPLASTY Right 06/18/2018   Procedure: UNICOMPARTMENTAL KNEE;  Surgeon: Marchia Bond, MD;  Location: [facility];  Service: Orthopedics;  Laterality: Right;    There were no vitals filed for this visit.  Subjective Assessment - 06/26/18 0803    Subjective  [name] reports that she continues to have pain in the Rt knee - sometimes worse than others     Currently in Pain?  Yes    Pain Score  7     Pain Location  Knee    Pain Orientation  Right    Pain Descriptors / Indicators  Dull;Aching    Pain Type  Surgical pain;Chronic pain         [facility] PT  Assessment - 06/26/18 0001      Assessment   Medical Diagnosis  Rt partial knee replacement     Referring Provider (PT)  Dr Carter Kitten     Onset Date/Surgical Date  06/18/18    Hand Dominance  Right    Next MD Visit  07/01/18 9:30 am     Prior Therapy  in hospital       AROM   Right Knee Extension  -4    Right Knee Flexion  78   supine - 64 deg 12/16   Left Knee Extension  -4    Left Knee Flexion  115                   [facility] Adult PT Treatment/Exercise - 06/26/18 0001      Knee/Hip Exercises: Stretches   Knee: Self-Stretch to increase Flexion  Right;5 reps;30 seconds   sitting - scoot forward for stretch      Knee/Hip Exercises: Aerobic   Nustep   L5 x 6 min UE(10) for ROM using arms       Knee/Hip Exercises: Supine   Quad Sets  AROM;Right;Strengthening;10 reps  5 sec hold knee resting on noodle    Heel Slides  AAROM;Right;5 reps   pt assisting with strap    Straight Leg Raises  AROM;Strengthening;Right;10 reps   5 sec hold - PT assist with first few reps     Knee/Hip Exercises: Prone   Straight Leg Raises Limitations  SLR incresaed pain iin back     Other Prone Exercises  quad set 5 rep x 10     Other Prone Exercises  quad set in prone 5 sec x 10       Moist Heat Therapy   Number Minutes Moist Heat  15 Minutes    Moist Heat Location  Lumbar Spine   thoracic to upper lumbar - for cramping in the back      Vasopneumatic   Number Minutes Vasopneumatic   15 minutes    Vasopnuematic Location   Knee   Rt   Vasopneumatic Pressure  Low    Vasopneumatic Temperature   34             PT Education - 06/26/18 0845    Education Details  HEP    Person(s) Educated  Patient    Methods  Explanation;Demonstration;Tactile cues;Verbal cues;Handout    Comprehension  Verbalized understanding;Returned demonstration;Verbal cues required;Tactile cues required       PT Short Term Goals - 06/24/18 1354      PT SHORT TERM GOAL #1   Title  Independent in  initial HEP 08/05/18    Time  6    Period  Weeks    Status  New      PT SHORT TERM GOAL #2   Title  Abulation with least assistive device with good gait pattern 08/05/18    Time  6    Period  Weeks    Status  New        PT Long Term Goals - 06/24/18 1359      PT LONG TERM GOAL #1   Title  Increased AROM Rt knee 100 deg flexion to 0 deg exension 09/16/18    Time  12    Period  Weeks    Status  New      PT LONG TERM GOAL #2   Title  Increased strength to 4+/5 to 5/5 Rt LE 09/16/18    Time  12    Period  Weeks      PT LONG TERM GOAL #3   Title  Ambulation for community distances without assistive device or [facility] 09/16/18    Time  12    Period  Weeks    Status  New      PT LONG TERM GOAL #4   Title  Independent in HEP 09/16/18    Time  6    Period  Weeks    Status  New      PT LONG TERM GOAL #5   Title  Improve FOTO to </= 56% limitation 09/16/18    Time  6    Period  Weeks    Status  New            Plan - 06/26/18 0803    Clinical Impression Statement  Progressing well today with pt demonstrating increased ROM. Now lifting Lt LE with SLR independently.     Rehab Potential  Good    PT Frequency  2x / week    PT Duration  12 weeks    PT Treatment/Interventions  Patient/family education;ADLs/Self Care Home Management;Cryotherapy;Iontophoresis 87m/ml Dexamethasone;Moist Heat;Scar  mobilization;Passive range of motion;Dry needling;Manual techniques;Balance training;Gait training;Neuromuscular re-education;Therapeutic activities;Therapeutic exercise;Functional mobility training    PT Next Visit Plan  progress with ROM and strength exercises; gait training; functional activity and mobiilty; balance activities; heat/ice only     Consulted and Agree with Plan of Care  Patient       Patient will benefit from skilled therapeutic intervention in order to improve the following deficits and impairments:  Improper body mechanics, Pain, Increased muscle spasms, Decreased strength,  Decreased mobility, Decreased range of motion, Abnormal gait, Decreased activity tolerance, Decreased balance  Visit Diagnosis: Acute pain of right knee  Other symptoms and signs involving the musculoskeletal system  Muscle weakness (generalized)  Other abnormalities of gait and mobility     Problem List Patient Active Problem List   Diagnosis Date Noted  . Primary localized osteoarthritis of right knee 06/18/2018  . Status post right partial knee replacement 06/18/2018    Kambrey Hagger Nilda Simmer PT, MPH  06/26/2018, 8:51 AM  [facility] [address] [place], [name], [address] Phone: [phone]   Fax:  [phone]  Name: [name] MRN: [id number] Date of Birth: [birth year]

## 2018-06-26 NOTE — Patient Instructions (Signed)
Bracing With Heel Slides (Supine)    With neutral spine, tighten pelvic floor and abdominals and hold. slide heel to bottom, helping with strap and hands as needed to increase bend in knee.  Hold 5-10 sec Repeat _10__ times. Do _2-3__ times a day.   Quad Set    With other leg bent, foot flat, slowly tighten muscles on thigh of straight leg while counting out loud to _5 sec ___.  Repeat __10__ times. Do __2-3__ sessions per day.  HIP: Flexion / KNEE: Extension, Straight Leg Raise    Tighten quad (as above), then Raise leg, keeping knee straight. Perform slowly. _10__ reps per set, __1-3_ sets per day, _2-3 __ time per dayAbduction: Side Leg Lift   (Eccentric) - Side-Lying    Lie on side. Lift top leg slightly higher than shoulder level. Keep top leg straight with body, toes pointing forward. Slowly lower for 3-5 seconds. _10__ reps per set, _1-2__ sets per day, __2-3 times per day  Gluteal Sets    Tighten buttocks while pressing pelvis to floor. Hold ___5_ seconds. Repeat __10__ times per set. Do __1-2__ sets per session. Do __1__ sessions per day.  Quadriceps Set (Prone)    With toes supporting lower legs, tighten thigh muscles to straighten knees. Hold __5__ seconds. Relax. Repeat _10___ times per set. Do __1-2__ sets per session. Do __1__ sessions per day.

## 2018-07-01 ENCOUNTER — Ambulatory Visit (INDEPENDENT_AMBULATORY_CARE_PROVIDER_SITE_OTHER): Payer: Medicare Other | Admitting: Rehabilitative and Restorative Service Providers"

## 2018-07-01 ENCOUNTER — Encounter: Payer: Self-pay | Admitting: Rehabilitative and Restorative Service Providers"

## 2018-07-01 DIAGNOSIS — M1711 Unilateral primary osteoarthritis, right knee: Secondary | ICD-10-CM | POA: Diagnosis not present

## 2018-07-01 DIAGNOSIS — M25561 Pain in right knee: Secondary | ICD-10-CM

## 2018-07-01 DIAGNOSIS — R2689 Other abnormalities of gait and mobility: Secondary | ICD-10-CM | POA: Diagnosis not present

## 2018-07-01 DIAGNOSIS — R29898 Other symptoms and signs involving the musculoskeletal system: Secondary | ICD-10-CM

## 2018-07-01 DIAGNOSIS — M6281 Muscle weakness (generalized): Secondary | ICD-10-CM | POA: Diagnosis not present

## 2018-07-01 NOTE — Therapy (Signed)
[place], [name], [address] Phone: [phone]   Fax:  [phone]  Physical Therapy Treatment  Patient Details  Name: [name] MRN: [id number] Date of Birth: August 27, 1934 Referring Provider (PT): Dr Carter Kitten    Encounter Date: 07/01/2018  PT End of Session - 07/01/18 0721    Visit Number  3    Number of Visits  24    Date for PT Re-Evaluation  09/23/18    PT Start Time  0710    PT Stop Time  0805    PT Time Calculation (min)  55 min    Activity Tolerance  Patient tolerated treatment well       Past Medical History:  Diagnosis Date  . Arthritis   . Asthma   . Chronic sinusitis   . Complication of anesthesia   . Family history of adverse reaction to anesthesia    sister is hard to wake up from Anesthesia  . Follicular lymphoma ([place])    received Chemotherapy (2 years ago)  and radiation (last 12/2017)  . Hyperlipidemia   . Hypertension   . Hypothyroidism   . Meniere's disease   . PONV (postoperative nausea and vomiting)   . Primary localized osteoarthritis of right knee 06/18/2018  . Reflux     Past Surgical History:  Procedure Laterality Date  . ABDOMINAL HYSTERECTOMY    . BONE MARROW BIOPSY  06/16/2014   at [place]     bilateral  . CHOLECYSTECTOMY    . KNEE ARTHROSCOPY    . PARTIAL KNEE ARTHROPLASTY Right 06/18/2018   Procedure: UNICOMPARTMENTAL KNEE;  Surgeon: Marchia Bond, MD;  [facility];  Service: Orthopedics;  Laterality: Right;    There were no vitals filed for this visit.  Subjective Assessment - 07/01/18 0724    Subjective  Some pain at times. Working on her exercises at home. Walking some without walker.     Currently in Pain?  Yes    Pain Score  4     Pain Location  Knee    Pain Orientation  Right    Pain Descriptors / Indicators  Dull;Aching    Pain Onset  1 to 4 weeks ago    Pain Frequency  Intermittent     Aggravating Factors   increased activity    Pain Relieving Factors  meds; decreased activity          [facility] PT Assessment - 07/01/18 0001      Assessment   Medical Diagnosis  Rt partial knee replacement     Referring Provider (PT)  Dr Carter Kitten     Onset Date/Surgical Date  06/18/18    Hand Dominance  Right    Next MD Visit  07/01/18 9:30 am     Prior Therapy  in hospital       AROM   Right Knee Extension  0    Right Knee Flexion  84   sitting    Left Knee Extension  -4    Left Knee Flexion  115      Ambulation/Gait   Gait Comments  gait training with cane - difficulty with gait pattern with cane and without assistive device.                    [facility] Adult PT Treatment/Exercise - 07/01/18 0001      Knee/Hip Exercises: Stretches   Passive Hamstring Stretch  Right;3 reps;30  seconds   supine with strap    Knee: Self-Stretch to increase Flexion  Right;5 reps;30 seconds   sitting - scoot forward for stretch      Knee/Hip Exercises: Aerobic   Nustep   L5 x 7 min UE(10) for ROM using arms       Knee/Hip Exercises: Seated   Heel Slides  AROM;AAROM;Right   pt working on knee flexion - stretch and hold; repeat      Knee/Hip Exercises: Supine   Quad Sets  AROM;Right;Strengthening;10 reps   5 sec hold knee resting on noodle    Heel Slides  AAROM;Right;5 reps   pt assisting with strap    Bridges  Strengthening;Both;10 reps   5 sec hold    Straight Leg Raises  AROM;Strengthening;Right;10 reps   5 sec hold - PT assist with first few reps     Knee/Hip Exercises: Sidelying   Hip ABduction  AROM;Strengthening;Right;10 reps      Moist Heat Therapy   Number Minutes Moist Heat  15 Minutes    Moist Heat Location  Lumbar Spine   thoracic to upper lumbar - for cramping in the back      Vasopneumatic   Number Minutes Vasopneumatic   15 minutes    Vasopnuematic Location   Knee   Rt   Vasopneumatic Pressure  Low    Vasopneumatic Temperature   34              PT Education - 07/01/18 0754    Education Details  HEP gait training with cane     Person(s) Educated  Patient    Methods  Explanation;Demonstration;Tactile cues;Verbal cues;Handout    Comprehension  Verbalized understanding;Returned demonstration;Verbal cues required;Tactile cues required       PT Short Term Goals - 06/24/18 1354      PT SHORT TERM GOAL #1   Title  Independent in initial HEP 08/05/18    Time  6    Period  Weeks    Status  New      PT SHORT TERM GOAL #2   Title  Abulation with least assistive device with good gait pattern 08/05/18    Time  6    Period  Weeks    Status  New        PT Long Term Goals - 06/24/18 1359      PT LONG TERM GOAL #1   Title  Increased AROM Rt knee 100 deg flexion to 0 deg exension 09/16/18    Time  12    Period  Weeks    Status  New      PT LONG TERM GOAL #2   Title  Increased strength to 4+/5 to 5/5 Rt LE 09/16/18    Time  12    Period  Weeks      PT LONG TERM GOAL #3   Title  Ambulation for community distances without assistive device or [facility] 09/16/18    Time  12    Period  Weeks    Status  New      PT LONG TERM GOAL #4   Title  Independent in HEP 09/16/18    Time  6    Period  Weeks    Status  New      PT LONG TERM GOAL #5   Title  Improve FOTO to </= 56% limitation 09/16/18    Time  6    Period  Weeks    Status  New  Plan - 07/01/18 0721    Clinical Impression Statement  Less pain; improving mobility; ambulating some without the walker. Progressing with exercise. Difficulty with gait pattern with without walker - difficulty with cane. Will require additional work with gait with cane or without assistive device. Progressing well with rehab.     Rehab Potential  Good    PT Frequency  2x / week    PT Duration  12 weeks    PT Treatment/Interventions  Patient/family education;ADLs/Self Care Home Management;Cryotherapy;Iontophoresis 84m/ml Dexamethasone;Moist Heat;Scar mobilization;Passive range  of motion;Dry needling;Manual techniques;Balance training;Gait training;Neuromuscular re-education;Therapeutic activities;Therapeutic exercise;Functional mobility training    PT Next Visit Plan  progress with ROM and strength exercises; gait training; functional activity and mobiilty; balance activities; heat/ice only Note to MD     Consulted and Agree with Plan of Care  Patient       Patient will benefit from skilled therapeutic intervention in order to improve the following deficits and impairments:  Improper body mechanics, Pain, Increased muscle spasms, Decreased strength, Decreased mobility, Decreased range of motion, Abnormal gait, Decreased activity tolerance, Decreased balance  Visit Diagnosis: Acute pain of right knee  Other symptoms and signs involving the musculoskeletal system  Muscle weakness (generalized)  Other abnormalities of gait and mobility     Problem List Patient Active Problem List   Diagnosis Date Noted  . Primary localized osteoarthritis of right knee 06/18/2018  . Status post right partial knee replacement 06/18/2018    Celyn PNilda SimmerPT, MPH  07/01/2018, 8:03 AM  [facility] [place] [place] [address]Phone: [phone]  Fax:  [phone] Name: PKaianna DolezalMRN: [id number] of Birth: 6Jun 26, 1936

## 2018-07-01 NOTE — Patient Instructions (Signed)
Achilles / Gastroc, Standing    Stand, right foot behind, heel on floor and turned slightly out, leg straight, forward leg bent. Move hips forward. Hold _30__ seconds. Repeat _3__ times per session. Do _2-3__ sessions per day.  Bracing With Heel Slides (Supine)    With neutral spine, tighten pelvic floor and abdominals and hold. Alternating legs, slide heel to bottom. Repeat _10__ times. Do _2-3__ times a day. Can use strap to help bend knee   Bridging    Slowly raise buttocks from floor, keeping core tight. Hold 5 sec Repeat _10__ times per set. Do __1-3__ sets per session. Do ___1-2_ sessions per day.   HIP: Hamstrings - Supine Opposite knee - left knee - bent     Place strap around foot. Raise leg up, keep knee straight. Hold _30__ seconds. _3__ reps per set, _2-3 times per day

## 2018-07-04 ENCOUNTER — Ambulatory Visit (INDEPENDENT_AMBULATORY_CARE_PROVIDER_SITE_OTHER): Payer: Medicare Other | Admitting: Rehabilitative and Restorative Service Providers"

## 2018-07-04 ENCOUNTER — Encounter: Payer: Self-pay | Admitting: Rehabilitative and Restorative Service Providers"

## 2018-07-04 DIAGNOSIS — M6281 Muscle weakness (generalized): Secondary | ICD-10-CM

## 2018-07-04 DIAGNOSIS — R29898 Other symptoms and signs involving the musculoskeletal system: Secondary | ICD-10-CM

## 2018-07-04 DIAGNOSIS — M25561 Pain in right knee: Secondary | ICD-10-CM

## 2018-07-04 DIAGNOSIS — R2689 Other abnormalities of gait and mobility: Secondary | ICD-10-CM

## 2018-07-04 NOTE — Therapy (Signed)
[place], [name], [address] Phone: [phone]   Fax:  [phone]  Physical Therapy Treatment  Patient Details  Name: [name] MRN: [id number] Date of Birth: [birth year] Referring Provider (PT): Dr Carter Kitten    Encounter Date: 07/04/2018  PT End of Session - 07/04/18 0852    Visit Number  4    Number of Visits  24    Date for PT Re-Evaluation  09/23/18    PT Start Time  0835    PT Stop Time  0930    PT Time Calculation (min)  55 min    Activity Tolerance  Patient tolerated treatment well       Past Medical History:  Diagnosis Date  . Arthritis   . Asthma   . Chronic sinusitis   . Complication of anesthesia   . Family history of adverse reaction to anesthesia    sister is hard to wake up from Anesthesia  . Follicular lymphoma ([place])    received Chemotherapy (2 years ago)  and radiation (last 12/2017)  . Hyperlipidemia   . Hypertension   . Hypothyroidism   . Meniere's disease   . PONV (postoperative nausea and vomiting)   . Primary localized osteoarthritis of right knee 06/18/2018  . Reflux     Past Surgical History:  Procedure Laterality Date  . ABDOMINAL HYSTERECTOMY    . BONE MARROW BIOPSY  06/16/2014   at [place]     bilateral  . CHOLECYSTECTOMY    . KNEE ARTHROSCOPY    . PARTIAL KNEE ARTHROPLASTY Right 06/18/2018   Procedure: UNICOMPARTMENTAL KNEE;  Surgeon: Marchia Bond, MD;  [facility]: [facility];  Service: Orthopedics;  Laterality: Right;    There were no vitals filed for this visit.  Subjective Assessment - 07/04/18 0852    Subjective  Increased pain Monday - was up and in doctors offices several hours for her MD appointment and an appointment with her daughter Still having some swelling and "heat" in her knee. MD pleaed with progress - staples removed; wound covered with steristrips - dry and intact.     Currently in Pain?  Yes    Pain  Score  7     Pain Location  Knee    Pain Orientation  Right    Pain Descriptors / Indicators  Dull;Aching    Pain Type  Surgical pain;Chronic pain    Pain Onset  1 to 4 weeks ago    Pain Frequency  Intermittent         [facility] PT Assessment - 07/04/18 0001      Assessment   Medical Diagnosis  Rt partial knee replacement     Referring Provider (PT)  Dr Carter Kitten     Onset Date/Surgical Date  06/18/18    Hand Dominance  Right    Next MD Visit  07/01/18 9:30 am     Prior Therapy  in hospital       AROM   Right Knee Flexion  91   supine                   [facility] Adult PT Treatment/Exercise - 07/04/18 0001      Knee/Hip Exercises: Stretches   Knee: Self-Stretch to increase Flexion  Right;5 reps;30 seconds   PT assist in supine      Knee/Hip Exercises: Aerobic   Nustep   L4 x 10 min UE(10) for ROM using  arms       Knee/Hip Exercises: Standing   Knee Flexion  AROM;Right;Left;10 reps    Knee Flexion Limitations  alternating tapping foot to 12 inch step     Hip Abduction  AROM;Stengthening;Right;Left;10 reps   leading with heel      Knee/Hip Exercises: Seated   Heel Slides  AROM;AAROM;Right   pt working on knee flexion - stretch and hold; repeat      Knee/Hip Exercises: Supine   Quad Sets  AROM;Right;Strengthening;10 reps   5 sec hold knee resting on noodle    Heel Slides  AAROM;Right;5 reps   pt assisting with strap    Bridges  Strengthening;Both;10 reps   5 sec hold    Straight Leg Raises  AROM;Strengthening;Right;10 reps   5 sec hold - PT assist with first few reps     Cryotherapy   Number Minutes Cryotherapy  15 Minutes    Cryotherapy Location  Lumbar Spine   thoracic spine - pt hot natured     Vasopneumatic   Number Minutes Vasopneumatic   15 minutes    Vasopnuematic Location   Knee   Rt   Vasopneumatic Pressure  Low    Vasopneumatic Temperature   34               PT Short Term Goals - 06/24/18 1354      PT SHORT TERM GOAL #1    Title  Independent in initial HEP 08/05/18    Time  6    Period  Weeks    Status  New      PT SHORT TERM GOAL #2   Title  Abulation with least assistive device with good gait pattern 08/05/18    Time  6    Period  Weeks    Status  New        PT Long Term Goals - 06/24/18 1359      PT LONG TERM GOAL #1   Title  Increased AROM Rt knee 100 deg flexion to 0 deg exension 09/16/18    Time  12    Period  Weeks    Status  New      PT LONG TERM GOAL #2   Title  Increased strength to 4+/5 to 5/5 Rt LE 09/16/18    Time  12    Period  Weeks      PT LONG TERM GOAL #3   Title  Ambulation for community distances without assistive device or [place] 09/16/18    Time  12    Period  Weeks    Status  New      PT LONG TERM GOAL #4   Title  Independent in HEP 09/16/18    Time  6    Period  Weeks    Status  New      PT LONG TERM GOAL #5   Title  Improve FOTO to </= 56% limitation 09/16/18    Time  6    Period  Weeks    Status  New            Plan - 07/04/18 1941    Clinical Impression Statement  Sutures removed, steristrips dry and in place. Patient continues to demonstrate increases in ROM and is progressing with exercises. Needs to continue to work on ROM/strength/gait pattern. Progressing well toward rehab goals.     Rehab Potential  Good    PT Frequency  2x / week    PT Duration  12 weeks  PT Treatment/Interventions  Patient/family education;ADLs/Self Care Home Management;Cryotherapy;Iontophoresis 21m/ml Dexamethasone;Moist Heat;Scar mobilization;Passive range of motion;Dry needling;Manual techniques;Balance training;Gait training;Neuromuscular re-education;Therapeutic activities;Therapeutic exercise;Functional mobility training    PT Next Visit Plan  progress with ROM and strength exercises; gait training; functional activity and mobiilty; balance activities; heat/ice only     Consulted and Agree with Plan of Care  Patient       Patient will benefit from skilled therapeutic  intervention in order to improve the following deficits and impairments:  Improper body mechanics, Pain, Increased muscle spasms, Decreased strength, Decreased mobility, Decreased range of motion, Abnormal gait, Decreased activity tolerance, Decreased balance  Visit Diagnosis: Acute pain of right knee  Other symptoms and signs involving the musculoskeletal system  Muscle weakness (generalized)  Other abnormalities of gait and mobility     Problem List Patient Active Problem List   Diagnosis Date Noted  . Primary localized osteoarthritis of right knee 06/18/2018  . Status post right partial knee replacement 06/18/2018     Kean Gautreau PNilda SimmerPT, MPH  07/04/2018, 9:28 AM  [facility] [address]Phone: [phone]  Fax:  [phone] Name: PCarole DonerMRN: [id number] of Birth: [birth year]

## 2018-07-08 ENCOUNTER — Ambulatory Visit (INDEPENDENT_AMBULATORY_CARE_PROVIDER_SITE_OTHER): Payer: Medicare Other | Admitting: Physical Therapy

## 2018-07-08 VITALS — BP 159/62 | HR 61

## 2018-07-08 DIAGNOSIS — M25561 Pain in right knee: Secondary | ICD-10-CM

## 2018-07-08 DIAGNOSIS — M6281 Muscle weakness (generalized): Secondary | ICD-10-CM

## 2018-07-08 DIAGNOSIS — R29898 Other symptoms and signs involving the musculoskeletal system: Secondary | ICD-10-CM

## 2018-07-08 NOTE — Therapy (Signed)
[place]  [place], [name], [address] Phone: [phone]   Fax:  [phone]  Physical Therapy Treatment  Patient Details  Name: [name] MRN: [id number] Date of Birth: [birth year] Referring Provider (PT): Dr Carter Kitten    Encounter Date: 07/08/2018  PT End of Session - 07/08/18 0800    Visit Number  5    Number of Visits  24    Date for PT Re-Evaluation  09/23/18    PT Start Time  0800    PT Stop Time  0858    PT Time Calculation (min)  58 min    Activity Tolerance  Patient tolerated treatment well    Behavior During Therapy  [facility] for tasks assessed/performed       Past Medical History:  Diagnosis Date  . Arthritis   . Asthma   . Chronic sinusitis   . Complication of anesthesia   . Family history of adverse reaction to anesthesia    sister is hard to wake up from Anesthesia  . Follicular lymphoma ([place])    received Chemotherapy (2 years ago)  and radiation (last 12/2017)  . Hyperlipidemia   . Hypertension   . Hypothyroidism   . Meniere's disease   . PONV (postoperative nausea and vomiting)   . Primary localized osteoarthritis of right knee 06/18/2018  . Reflux     Past Surgical History:  Procedure Laterality Date  . ABDOMINAL HYSTERECTOMY    . BONE MARROW BIOPSY  06/16/2014   at [place]     bilateral  . CHOLECYSTECTOMY    . KNEE ARTHROSCOPY    . PARTIAL KNEE ARTHROPLASTY Right 06/18/2018   Procedure: UNICOMPARTMENTAL KNEE;  Surgeon: Marchia Bond, MD;  Location: [facility];  Service: Orthopedics;  Laterality: Right;    Vitals:   07/08/18 0806  BP: (!) 159/62  Pulse: 61    Subjective Assessment - 07/08/18 0811    Subjective  Pt reports she feels warm today.  She feels pain in all her joints (motions to her hands feeling stiff).  She's been getting up once an hour to avoid getting too stiff.  She complains of swelling down in her Rt ankle.     Patient  Stated Goals  walk normal again; get in and out of chair and on off toilet more easily     Currently in Pain?  Yes    Pain Score  6     Pain Location  Knee    Pain Orientation  Right    Pain Descriptors / Indicators  Dull;Aching    Aggravating Factors   bending her knee    Pain Relieving Factors  medication          [facility] PT Assessment - 07/08/18 0001      Assessment   Medical Diagnosis  Rt partial knee replacement     Referring Provider (PT)  Dr Carter Kitten     Onset Date/Surgical Date  06/18/18    Hand Dominance  Right    Next MD Visit  07/01/18 9:30 am     Prior Therapy  in hospital       AROM   Right Knee Flexion  93   seated scoot      [facility] Adult PT Treatment/Exercise - 07/08/18 0001      Knee/Hip Exercises: Stretches   Passive Hamstring Stretch  Right;2 reps;30 seconds   seated with straight back   Other Knee/Hip Stretches  seated scoots x 10 reps, 5 sec holds      Knee/Hip Exercises: Aerobic   Nustep   L4 x 6.5 min (legs and arms) for ROM       Knee/Hip Exercises: Standing   Lateral Step Up  Right;1 set;10 reps;Step Height: 4";Hand Hold: 2    Forward Step Up  Right;1 set;10 reps;Hand Hold: 2;Step Height: 4"    Forward Step Up Limitations  2nd set, 5 reps on 6" step with BUE support.     Gait Training  80 ft trials with RW;  cues for increased Rt heel strike, toe off and increased Rt knee bend on swing through phase; pt moves at slow speed. improved quality with increased distance.  3 trials.   Trial with SPC for 20 ft with same cues- CGA for safety, pt unsteady with Rt stance.       Knee/Hip Exercises: Seated   Sit to Sand  1 set;5 reps;without UE support   to chair     Vasopneumatic   Number Minutes Vasopneumatic   15 minutes    Vasopnuematic Location   Knee   Rt   Vasopneumatic Pressure  Low    Vasopneumatic Temperature   34               PT Short Term Goals - 06/24/18 1354      PT SHORT TERM GOAL #1   Title  Independent in initial HEP  08/05/18    Time  6    Period  Weeks    Status  New      PT SHORT TERM GOAL #2   Title  Abulation with least assistive device with good gait pattern 08/05/18    Time  6    Period  Weeks    Status  New        PT Long Term Goals - 06/24/18 1359      PT LONG TERM GOAL #1   Title  Increased AROM Rt knee 100 deg flexion to 0 deg exension 09/16/18    Time  12    Period  Weeks    Status  New      PT LONG TERM GOAL #2   Title  Increased strength to 4+/5 to 5/5 Rt LE 09/16/18    Time  12    Period  Weeks      PT LONG TERM GOAL #3   Title  Ambulation for community distances without assistive device or [facility] 09/16/18    Time  12    Period  Weeks    Status  New      PT LONG TERM GOAL #4   Title  Independent in HEP 09/16/18    Time  6    Period  Weeks    Status  New      PT LONG TERM GOAL #5   Title  Improve FOTO to </= 56% limitation 09/16/18    Time  6    Period  Weeks    Status  New            Plan - 07/08/18 4970    Clinical Impression Statement  Pt initially flushed and sweating upon initial gait trial at beginning of session. Pt took anti-nausea medicine shortly after. She reported feeling warm throughout session, but no other increase in symptoms.  She demonstrated increased unsteadiness with gait trial with SPC; improved gait with RW.  She tolerated all exercises well and will benefit from continued PT intervention  to maximize functional mobility.     Rehab Potential  Good    PT Frequency  2x / week    PT Duration  12 weeks    PT Treatment/Interventions  Patient/family education;ADLs/Self Care Home Management;Cryotherapy;Iontophoresis 56m/ml Dexamethasone;Moist Heat;Scar mobilization;Passive range of motion;Dry needling;Manual techniques;Balance training;Gait training;Neuromuscular re-education;Therapeutic activities;Therapeutic exercise;Functional mobility training    PT Next Visit Plan  progress with ROM and strength exercises; gait training; functional activity and mobiilty;  balance activities; heat/ice only     Consulted and Agree with Plan of Care  Patient       Patient will benefit from skilled therapeutic intervention in order to improve the following deficits and impairments:  Improper body mechanics, Pain, Increased muscle spasms, Decreased strength, Decreased mobility, Decreased range of motion, Abnormal gait, Decreased activity tolerance, Decreased balance  Visit Diagnosis: Acute pain of right knee  Other symptoms and signs involving the musculoskeletal system  Muscle weakness (generalized)     Problem List Patient Active Problem List   Diagnosis Date Noted  . Primary localized osteoarthritis of right knee 06/18/2018  . Status post right partial knee replacement 06/18/2018   JKerin Perna PTA 07/08/18 8:47 AM  [facility] [address]Phone: [phone]  Fax:  [phone] Name: PElham FiniMRN: [id number] of Birth: [birth year]

## 2018-07-11 ENCOUNTER — Ambulatory Visit (INDEPENDENT_AMBULATORY_CARE_PROVIDER_SITE_OTHER): Payer: Medicare Other | Admitting: Physical Therapy

## 2018-07-11 ENCOUNTER — Encounter: Payer: Self-pay | Admitting: Physical Therapy

## 2018-07-11 DIAGNOSIS — J328 Other chronic sinusitis: Secondary | ICD-10-CM | POA: Diagnosis not present

## 2018-07-11 DIAGNOSIS — M25561 Pain in right knee: Secondary | ICD-10-CM

## 2018-07-11 DIAGNOSIS — J300 Vasomotor rhinitis: Secondary | ICD-10-CM | POA: Insufficient documentation

## 2018-07-11 DIAGNOSIS — R0982 Postnasal drip: Secondary | ICD-10-CM | POA: Diagnosis not present

## 2018-07-11 DIAGNOSIS — J329 Chronic sinusitis, unspecified: Secondary | ICD-10-CM | POA: Diagnosis not present

## 2018-07-11 DIAGNOSIS — J339 Nasal polyp, unspecified: Secondary | ICD-10-CM | POA: Diagnosis not present

## 2018-07-11 DIAGNOSIS — M6281 Muscle weakness (generalized): Secondary | ICD-10-CM

## 2018-07-11 DIAGNOSIS — R2689 Other abnormalities of gait and mobility: Secondary | ICD-10-CM

## 2018-07-11 DIAGNOSIS — J3489 Other specified disorders of nose and nasal sinuses: Secondary | ICD-10-CM | POA: Insufficient documentation

## 2018-07-11 DIAGNOSIS — R29898 Other symptoms and signs involving the musculoskeletal system: Secondary | ICD-10-CM

## 2018-07-11 NOTE — Therapy (Signed)
[place], [name], [address] Phone: [phone]   Fax:  [phone]  Physical Therapy Treatment  Patient Details  Name: [name] MRN: [id number] Date of Birth: 18-Mar-1935 Referring Provider (PT): Dr Carter Kitten    Encounter Date: 07/11/2018  PT End of Session - 07/11/18 0807    Visit Number  6    Number of Visits  24    Date for PT Re-Evaluation  09/23/18    PT Start Time  0803    PT Stop Time  0900    PT Time Calculation (min)  57 min    Activity Tolerance  Patient tolerated treatment well    Behavior During Therapy  [facility] for tasks assessed/performed       Past Medical History:  Diagnosis Date  . Arthritis   . Asthma   . Chronic sinusitis   . Complication of anesthesia   . Family history of adverse reaction to anesthesia    sister is hard to wake up from Anesthesia  . Follicular lymphoma ([place])    received Chemotherapy (2 years ago)  and radiation (last 12/2017)  . Hyperlipidemia   . Hypertension   . Hypothyroidism   . Meniere's disease   . PONV (postoperative nausea and vomiting)   . Primary localized osteoarthritis of right knee 06/18/2018  . Reflux     Past Surgical History:  Procedure Laterality Date  . ABDOMINAL HYSTERECTOMY    . BONE MARROW BIOPSY  06/16/2014   at [place]     bilateral  . CHOLECYSTECTOMY    . KNEE ARTHROSCOPY    . PARTIAL KNEE ARTHROPLASTY Right 06/18/2018   Procedure: UNICOMPARTMENTAL KNEE;  Surgeon: Marchia Bond, MD;  [facility];  Service: Orthopedics;  Laterality: Right;    There were no vitals filed for this visit.  Subjective Assessment - 07/11/18 0808    Subjective  Pt arrives ambulating without assistive device.  "I've been working on it (walking) since Monday".   She has been massaging her Rt knee and this has helped her pain level.     Currently in Pain?  Yes    Pain Score  4     Pain Location  Knee     Pain Orientation  Right    Pain Descriptors / Indicators  Dull;Tightness    Aggravating Factors   first moments after standing up (after sitting a while)    Pain Relieving Factors  ice, massage         [facility] PT Assessment - 07/11/18 0001      Assessment   Medical Diagnosis  Rt partial knee replacement     Referring Provider (PT)  Dr Carter Kitten     Onset Date/Surgical Date  06/18/18    Hand Dominance  Right    Next MD Visit  end of January.     Prior Therapy  in hospital        [facility] Adult PT Treatment/Exercise - 07/11/18 0001      Knee/Hip Exercises: Stretches   Passive Hamstring Stretch  Right;2 reps;30 seconds   supine with strap   Gastroc Stretch  Both;2 reps;30 seconds   incline board     Knee/Hip Exercises: Aerobic   Nustep   L4 x 6.5 min (legs and arms) for ROM     Other Aerobic  single lap around gym in between exercises to decrease stiffness.       Knee/Hip Exercises:  Standing   Forward Step Up  Right;1 set;10 reps;Hand Hold: 2;Step Height: 4"    Forward Step Up Limitations  2nd set, 5 reps on 6" step with BUE support.     Step Down  Left;1 set;10 reps;Hand Hold: 2;Step Height: 4"   and retro step up with RLE     Knee/Hip Exercises: Seated   Other Seated Knee/Hip Exercises  seated scoots with 10 sec hold x 8 reps to increase ROM    Sit to Sand  1 set;5 reps;without UE support   to chair     Vasopneumatic   Number Minutes Vasopneumatic   15 minutes    Vasopnuematic Location   Knee   Rt   Vasopneumatic Pressure  Low    Vasopneumatic Temperature   34 deg      Manual Therapy   Manual Therapy  Taping    Kinesiotex  Edema      Kinesiotix   Edema  I strip of sensitive skin tape applied to either side of incision on Rt knee to assist in pain and edema reduction             PT Education - 07/11/18 0904    Education Details  kinesiotape info    Person(s) Educated  Patient    Methods  Explanation;Handout    Comprehension  Verbalized understanding        PT Short Term Goals - 06/24/18 1354      PT SHORT TERM GOAL #1   Title  Independent in initial HEP 08/05/18    Time  6    Period  Weeks    Status  New      PT SHORT TERM GOAL #2   Title  Abulation with least assistive device with good gait pattern 08/05/18    Time  6    Period  Weeks    Status  New        PT Long Term Goals - 06/24/18 1359      PT LONG TERM GOAL #1   Title  Increased AROM Rt knee 100 deg flexion to 0 deg exension 09/16/18    Time  12    Period  Weeks    Status  New      PT LONG TERM GOAL #2   Title  Increased strength to 4+/5 to 5/5 Rt LE 09/16/18    Time  12    Period  Weeks      PT LONG TERM GOAL #3   Title  Ambulation for community distances without assistive device or [facility] 09/16/18    Time  12    Period  Weeks    Status  New      PT LONG TERM GOAL #4   Title  Independent in HEP 09/16/18    Time  6    Period  Weeks    Status  New      PT LONG TERM GOAL #5   Title  Improve FOTO to </= 56% limitation 09/16/18    Time  6    Period  Weeks    Status  New            Plan - 07/11/18 0902    Clinical Impression Statement  Pt tolerated all exercises, with slight increase in discomfort in Rt knee. Rt knee ROM gradually improving.  Discomfort reduced with use of vaso at end of session.  Goals are ongoing.     Rehab Potential  Good  PT Frequency  2x / week    PT Duration  12 weeks    PT Treatment/Interventions  Patient/family education;ADLs/Self Care Home Management;Cryotherapy;Iontophoresis 54m/ml Dexamethasone;Moist Heat;Scar mobilization;Passive range of motion;Dry needling;Manual techniques;Balance training;Gait training;Neuromuscular re-education;Therapeutic activities;Therapeutic exercise;Functional mobility training    PT Next Visit Plan  progress with ROM and strength exercises; gait training; functional activity and mobiilty; balance activities; heat/ice only     Consulted and Agree with Plan of Care  Patient       Patient will benefit  from skilled therapeutic intervention in order to improve the following deficits and impairments:  Improper body mechanics, Pain, Increased muscle spasms, Decreased strength, Decreased mobility, Decreased range of motion, Abnormal gait, Decreased activity tolerance, Decreased balance  Visit Diagnosis: Acute pain of right knee  Other symptoms and signs involving the musculoskeletal system  Muscle weakness (generalized)  Other abnormalities of gait and mobility     Problem List Patient Active Problem List   Diagnosis Date Noted  . Primary localized osteoarthritis of right knee 06/18/2018  . Status post right partial knee replacement 06/18/2018   JKerin Perna PTA 07/11/18 9:15 AM  [place] [address]Phone: [phone]  Fax:  [phone] Name: Jillian KroezeMRN: [id number] of Birth: [birth year]

## 2018-07-11 NOTE — Patient Instructions (Signed)

## 2018-07-15 ENCOUNTER — Ambulatory Visit (INDEPENDENT_AMBULATORY_CARE_PROVIDER_SITE_OTHER): Payer: Medicare Other | Admitting: Physical Therapy

## 2018-07-15 ENCOUNTER — Encounter: Payer: Self-pay | Admitting: Physical Therapy

## 2018-07-15 DIAGNOSIS — R2689 Other abnormalities of gait and mobility: Secondary | ICD-10-CM | POA: Diagnosis not present

## 2018-07-15 DIAGNOSIS — R29898 Other symptoms and signs involving the musculoskeletal system: Secondary | ICD-10-CM

## 2018-07-15 DIAGNOSIS — M25561 Pain in right knee: Secondary | ICD-10-CM

## 2018-07-15 DIAGNOSIS — M6281 Muscle weakness (generalized): Secondary | ICD-10-CM | POA: Diagnosis not present

## 2018-07-15 NOTE — Therapy (Addendum)
[place], [name], [address] Phone: [phone]   Fax:  [phone]  Physical Therapy Treatment  Patient Details  Name: [name] MRN: [id number] Date of Birth: 15-Apr-1935 Referring Provider (PT): Dr Carter Kitten    Encounter Date: 07/15/2018  PT End of Session - 07/15/18 0810    Visit Number  7    Number of Visits  24    Date for PT Re-Evaluation  09/23/18    PT Start Time  0806    PT Stop Time  0902    PT Time Calculation (min)  56 min    Activity Tolerance  Patient tolerated treatment well    Behavior During Therapy  [facility] for tasks assessed/performed       Past Medical History:  Diagnosis Date  . Arthritis   . Asthma   . Chronic sinusitis   . Complication of anesthesia   . Family history of adverse reaction to anesthesia    sister is hard to wake up from Anesthesia  . Follicular lymphoma ([place])    received Chemotherapy (2 years ago)  and radiation (last 12/2017)  . Hyperlipidemia   . Hypertension   . Hypothyroidism   . Meniere's disease   . PONV (postoperative nausea and vomiting)   . Primary localized osteoarthritis of right knee 06/18/2018  . Reflux     Past Surgical History:  Procedure Laterality Date  . ABDOMINAL HYSTERECTOMY    . BONE MARROW BIOPSY  06/16/2014   at [place]     bilateral  . CHOLECYSTECTOMY    . KNEE ARTHROSCOPY    . PARTIAL KNEE ARTHROPLASTY Right 06/18/2018   Procedure: UNICOMPARTMENTAL KNEE;  Surgeon: Marchia Bond, MD;  [facility];  Service: Orthopedics;  Laterality: Right;    There were no vitals filed for this visit.  Subjective Assessment - 07/15/18 0810    Subjective  Pt reports she didn't take her pain medicine this morning, "I want to get off that stuff".  Pt reports no new changes since last visit.     Patient Stated Goals  walk normal again; get in and out of chair and on off toilet more easily     Currently in Pain?  Yes    Pain Score  4    it's not that bad"   Pain Location  Knee    Pain Orientation  Right    Pain Descriptors / Indicators  Tightness;Aching    Aggravating Factors   first moments after standing up    Pain Relieving Factors  ice, massage         [facility] PT Assessment - 07/15/18 0001      Assessment   Medical Diagnosis  Rt partial knee replacement     Referring Provider (PT)  Dr Carter Kitten     Onset Date/Surgical Date  06/18/18    Hand Dominance  Right    Next MD Visit  07/31/18    Prior Therapy  in hospital       AROM   Right Knee Extension  0   in long sitting    Right Knee Flexion  101   on bicycle      [facility] Adult PT Treatment/Exercise - 07/15/18 0001      Knee/Hip Exercises: Stretches   Passive Hamstring Stretch  Right;2 reps;60 seconds   long sitting   Gastroc Stretch  Right;Left;2 reps;20 seconds      Knee/Hip Exercises:  Aerobic   Recumbent Bike  partial revolutions for ROM x 6 min       Knee/Hip Exercises: Standing   Lateral Step Up  Right;1 set;10 reps;Step Height: 4";Hand Hold: 2    Forward Step Up  Right;1 set;10 reps;Hand Hold: 2;Step Height: 4"    Step Down  Left;1 set;10 reps;Hand Hold: 2;Step Height: 4"   and retro step up with RLE   Gait Training  240 ft with RW with cues for increased knee flexion during swing through and increased heel strike; improved gait quality with cues and increased distance. 2nd trial of 75 ft; improved quality with less cues.     Other Standing Knee Exercises  standing in wide staggered stance with Rt leg back, working on flex/ext (to mimic toe off to swing through phase x 20 reps, 2 sets.       Knee/Hip Exercises: Seated   Long Arc Quad  Right;1 set;10 reps   5 sec hold    Other Seated Knee/Hip Exercises  seated scoots with 10 sec hold x 5 reps to increase ROM    Sit to Sand  1 set;5 reps;without UE support   to black mat, with RW in front     Knee/Hip Exercises: Supine   Quad Sets   Strengthening;Right;1 set;10 reps   10 sec holds     Vasopneumatic   Number Minutes Vasopneumatic   15 minutes    Vasopnuematic Location   Knee   Rt   Vasopneumatic Pressure  Low    Vasopneumatic Temperature   34 deg      Manual Therapy   Manual Therapy  Taping    Kinesiotex  Edema      Kinesiotix   Edema  I strip of sensitive skin tape applied to either side of incision on Rt knee to assist in pain and edema reduction         PT Short Term Goals - 06/24/18 1354      PT SHORT TERM GOAL #1   Title  Independent in initial HEP 08/05/18    Time  6    Period  Weeks    Status  New      PT SHORT TERM GOAL #2   Title  Abulation with least assistive device with good gait pattern 08/05/18    Time  6    Period  Weeks    Status  New        PT Long Term Goals - 06/24/18 1359      PT LONG TERM GOAL #1   Title  Increased AROM Rt knee 100 deg flexion to 0 deg exension 09/16/18    Time  12    Period  Weeks    Status  New      PT LONG TERM GOAL #2   Title  Increased strength to 4+/5 to 5/5 Rt LE 09/16/18    Time  12    Period  Weeks      PT LONG TERM GOAL #3   Title  Ambulation for community distances without assistive device or [facility] 09/16/18    Time  12    Period  Weeks    Status  New      PT LONG TERM GOAL #4   Title  Independent in HEP 09/16/18    Time  6    Period  Weeks    Status  New      PT LONG TERM GOAL #5   Title  Improve  FOTO to </= 56% limitation 09/16/18    Time  6    Period  Weeks    Status  New            Plan - 07/15/18 3845    Clinical Impression Statement  Pt ambulating into therapy with very rigid RLE during gait.  Pt demonstrated improved gait quality with cues and use of RW during session; encouraged pt to continue gait trials at home with RW to improve gait.  She also demonstrated improved Rt knee ROM. Pt progressing well towards all goals.     Rehab Potential  Good    PT Frequency  2x / week    PT Duration  12 weeks    PT  Treatment/Interventions  Patient/family education;ADLs/Self Care Home Management;Cryotherapy;Iontophoresis 70m/ml Dexamethasone;Moist Heat;Scar mobilization;Passive range of motion;Dry needling;Manual techniques;Balance training;Gait training;Neuromuscular re-education;Therapeutic activities;Therapeutic exercise;Functional mobility training    PT Next Visit Plan  continue progressive ROM and strengthening exercises for Rt knee; gait training.      Recommended Other Services  No modalities other than Heat/Ice    Consulted and Agree with Plan of Care  Patient       Patient will benefit from skilled therapeutic intervention in order to improve the following deficits and impairments:  Improper body mechanics, Pain, Increased muscle spasms, Decreased strength, Decreased mobility, Decreased range of motion, Abnormal gait, Decreased activity tolerance, Decreased balance  Visit Diagnosis: Acute pain of right knee  Other symptoms and signs involving the musculoskeletal system  Muscle weakness (generalized)  Other abnormalities of gait and mobility     Problem List Patient Active Problem List   Diagnosis Date Noted  . Primary localized osteoarthritis of right knee 06/18/2018  . Status post right partial knee replacement 06/18/2018   JKerin Perna PTA 07/15/18 9:17 AM  [facility] [address]Phone: [phone]  Fax:  [phone] Name: Jillian BasultoMRN: [id number] of Birth: 612/06/1935

## 2018-07-18 ENCOUNTER — Encounter: Payer: Self-pay | Admitting: Physical Therapy

## 2018-07-18 ENCOUNTER — Ambulatory Visit (INDEPENDENT_AMBULATORY_CARE_PROVIDER_SITE_OTHER): Payer: Medicare Other | Admitting: Physical Therapy

## 2018-07-18 DIAGNOSIS — R29898 Other symptoms and signs involving the musculoskeletal system: Secondary | ICD-10-CM

## 2018-07-18 DIAGNOSIS — M25561 Pain in right knee: Secondary | ICD-10-CM | POA: Diagnosis not present

## 2018-07-18 DIAGNOSIS — M6281 Muscle weakness (generalized): Secondary | ICD-10-CM

## 2018-07-18 DIAGNOSIS — R2689 Other abnormalities of gait and mobility: Secondary | ICD-10-CM

## 2018-07-18 NOTE — Patient Instructions (Signed)
Access Code: [email or web address]  URL: [email or web address]  Date: 07/18/2018  Prepared by: Kerin Perna   Exercises  Prone Quadriceps Stretch with Strap - 2-3 reps - 1 sets - 20-30 seonds hold - 1-2x daily - 7x weekly

## 2018-07-18 NOTE — Therapy (Signed)
[name], [name], [address] Phone: [phone]   Fax:  [phone]  Physical Therapy Treatment  Patient Details  Name: [name] MRN: [id number] Date of Birth: March 04, 1935 Referring Provider (PT): Dr Carter Kitten    Encounter Date: 07/18/2018  PT End of Session - 07/18/18 0818    Visit Number  8    Number of Visits  24    Date for PT Re-Evaluation  09/23/18    PT Start Time  0805    PT Stop Time  5188   pt req to end early due to transportation arrangements   PT Time Calculation (min)  47 min    Activity Tolerance  Patient tolerated treatment well    Behavior During Therapy  [facility] for tasks assessed/performed       Past Medical History:  Diagnosis Date  . Arthritis   . Asthma   . Chronic sinusitis   . Complication of anesthesia   . Family history of adverse reaction to anesthesia    sister is hard to wake up from Anesthesia  . Follicular lymphoma ([place])    received Chemotherapy (2 years ago)  and radiation (last 12/2017)  . Hyperlipidemia   . Hypertension   . Hypothyroidism   . Meniere's disease   . PONV (postoperative nausea and vomiting)   . Primary localized osteoarthritis of right knee 06/18/2018  . Reflux     Past Surgical History:  Procedure Laterality Date  . ABDOMINAL HYSTERECTOMY    . BONE MARROW BIOPSY  06/16/2014   at [place]     bilateral  . CHOLECYSTECTOMY    . KNEE ARTHROSCOPY    . PARTIAL KNEE ARTHROPLASTY Right 06/18/2018   Procedure: UNICOMPARTMENTAL KNEE;  Surgeon: Marchia Bond, MD;  Location: [facility];  Service: Orthopedics;  Laterality: Right;    There were no vitals filed for this visit.  Subjective Assessment - 07/18/18 0812    Subjective  Pt voices frustration of current status, "I'm an active person, and this is driving me nuts".  She is anxious to return to her normal activities. She states her Afib was bothering her the  last 2 nights.      Patient Stated Goals  walk normal again; get in and out of chair and on off toilet more easily     Currently in Pain?  Yes    Pain Score  6     Pain Location  Knee    Pain Orientation  Right    Pain Descriptors / Indicators  Aching    Aggravating Factors   first moments after standing up; prolonged standing.      Pain Relieving Factors  ice, massage         [facility] PT Assessment - 07/18/18 0001      Assessment   Medical Diagnosis  Rt partial knee replacement     Referring Provider (PT)  Dr Carter Kitten     Onset Date/Surgical Date  06/18/18    Hand Dominance  Right    Next MD Visit  07/31/18    Prior Therapy  in hospital       ROM / Strength   AROM / PROM / Strength  PROM      PROM   PROM Assessment Site  Knee    Right/Left Knee  Right    Right Knee Flexion  108       [facility] Adult PT Treatment/Exercise -  07/18/18 0001      Knee/Hip Exercises: Stretches   Passive Hamstring Stretch  Right;2 reps;30 seconds    Quad Stretch  Right;3 reps;Left;2 reps;30 seconds   prone with strap   Gastroc Stretch  Right;Left;2 reps;20 seconds   heel off step     Knee/Hip Exercises: Aerobic   Recumbent Bike  partial revolutions to full backward slow revolutions x 8 min    PTA present to monitor and discuss   Nustep  L4 x 5 min for ROM       Knee/Hip Exercises: Standing   Lateral Step Up  Right;1 set;10 reps;Hand Hold: 2;Step Height: 6"    Forward Step Up  Right;1 set;10 reps;Hand Hold: 2;Step Height: 6"    Step Down  Left;1 set;10 reps;Hand Hold: 2;Step Height: 4"   and retro step up with RLE   Gait Training  240 ft with RW with cues for increased knee flexion during swing through and increased heel strike; improved gait quality with cues and increased distance. 3 reps.        Moist Heat Therapy   Number Minutes Moist Heat  10 Minutes    Moist Heat Location  Knee   Lt     Vasopneumatic   Number Minutes Vasopneumatic   10 minutes    Vasopnuematic Location   Knee    Rt   Vasopneumatic Pressure  Low    Vasopneumatic Temperature   34 deg             PT Education - 07/18/18 0906    Education Details  HEP - added quad stretch     Person(s) Educated  Patient    Methods  Explanation;Handout;Verbal cues    Comprehension  Verbalized understanding;Returned demonstration       PT Short Term Goals - 06/24/18 1354      PT SHORT TERM GOAL #1   Title  Independent in initial HEP 08/05/18    Time  6    Period  Weeks    Status  New      PT SHORT TERM GOAL #2   Title  Abulation with least assistive device with good gait pattern 08/05/18    Time  6    Period  Weeks    Status  New        PT Long Term Goals - 06/24/18 1359      PT LONG TERM GOAL #1   Title  Increased AROM Rt knee 100 deg flexion to 0 deg exension 09/16/18    Time  12    Period  Weeks    Status  New      PT LONG TERM GOAL #2   Title  Increased strength to 4+/5 to 5/5 Rt LE 09/16/18    Time  12    Period  Weeks      PT LONG TERM GOAL #3   Title  Ambulation for community distances without assistive device or [facility] 09/16/18    Time  12    Period  Weeks    Status  New      PT LONG TERM GOAL #4   Title  Independent in HEP 09/16/18    Time  6    Period  Weeks    Status  New      PT LONG TERM GOAL #5   Title  Improve FOTO to </= 56% limitation 09/16/18    Time  6    Period  Weeks    Status  New            Plan - 07/18/18 0907    Clinical Impression Statement  Rt knee ROM gradually progressing.  Gait is also gradually improving, especially with focus on gait quality during use of RW.  Pt tolerated all exercises  well, without mild increase in discomfort.  Pain reduced wiht use of vaso at end of session.  Progressing towards goals.     Rehab Potential  Good    PT Frequency  2x / week    PT Duration  12 weeks    PT Treatment/Interventions  Patient/family education;ADLs/Self Care Home Management;Cryotherapy;Iontophoresis 70m/ml Dexamethasone;Moist Heat;Scar  mobilization;Passive range of motion;Dry needling;Manual techniques;Balance training;Gait training;Neuromuscular re-education;Therapeutic activities;Therapeutic exercise;Functional mobility training    PT Next Visit Plan  continue progressive ROM and strengthening exercises for Rt knee; gait training; add balance exercise to HEP.      PT Home Exercise Plan  Access Code: [email or web address]    Recommended Other Services  no modalities other than ice/heat     Consulted and Agree with Plan of Care  Patient       Patient will benefit from skilled therapeutic intervention in order to improve the following deficits and impairments:  Improper body mechanics, Pain, Increased muscle spasms, Decreased strength, Decreased mobility, Decreased range of motion, Abnormal gait, Decreased activity tolerance, Decreased balance  Visit Diagnosis: Acute pain of right knee  Other symptoms and signs involving the musculoskeletal system  Muscle weakness (generalized)  Other abnormalities of gait and mobility     Problem List Patient Active Problem List   Diagnosis Date Noted  . Primary localized osteoarthritis of right knee 06/18/2018  . Status post right partial knee replacement 06/18/2018   JKerin Perna PTA 07/18/18 9:14 AM  [facility] [address]Phone: [phone]  Fax:  [phone] Name: PLavaughn BisigMRN: [id number] of Birth: 609-11-1934

## 2018-07-22 ENCOUNTER — Ambulatory Visit (INDEPENDENT_AMBULATORY_CARE_PROVIDER_SITE_OTHER): Payer: Medicare Other | Admitting: Rehabilitative and Restorative Service Providers"

## 2018-07-22 ENCOUNTER — Encounter: Payer: Self-pay | Admitting: Rehabilitative and Restorative Service Providers"

## 2018-07-22 DIAGNOSIS — M25561 Pain in right knee: Secondary | ICD-10-CM | POA: Diagnosis not present

## 2018-07-22 DIAGNOSIS — M6281 Muscle weakness (generalized): Secondary | ICD-10-CM

## 2018-07-22 DIAGNOSIS — R2689 Other abnormalities of gait and mobility: Secondary | ICD-10-CM | POA: Diagnosis not present

## 2018-07-22 DIAGNOSIS — R29898 Other symptoms and signs involving the musculoskeletal system: Secondary | ICD-10-CM

## 2018-07-22 NOTE — Therapy (Signed)
[place], [name], [address] Phone: [phone]   Fax:  [phone]  Physical Therapy Treatment  Patient Details  Name: [name] MRN: [id number] Date of Birth: 26-Jun-1935 Referring Provider (PT): Dr Carter Kitten    Encounter Date: 07/22/2018  PT End of Session - 07/22/18 0806    Visit Number  9    Number of Visits  24    Date for PT Re-Evaluation  09/23/18    PT Start Time  0803    PT Stop Time  0853   cold pack post exercise    PT Time Calculation (min)  50 min    Activity Tolerance  Patient tolerated treatment well       Past Medical History:  Diagnosis Date  . Arthritis   . Asthma   . Chronic sinusitis   . Complication of anesthesia   . Family history of adverse reaction to anesthesia    sister is hard to wake up from Anesthesia  . Follicular lymphoma ([place])    received Chemotherapy (2 years ago)  and radiation (last 12/2017)  . Hyperlipidemia   . Hypertension   . Hypothyroidism   . Meniere's disease   . PONV (postoperative nausea and vomiting)   . Primary localized osteoarthritis of right knee 06/18/2018  . Reflux     Past Surgical History:  Procedure Laterality Date  . ABDOMINAL HYSTERECTOMY    . BONE MARROW BIOPSY  06/16/2014   at [place]     bilateral  . CHOLECYSTECTOMY    . KNEE ARTHROSCOPY    . PARTIAL KNEE ARTHROPLASTY Right 06/18/2018   Procedure: UNICOMPARTMENTAL KNEE;  Surgeon: Marchia Bond, MD;  [facility]: [facility];  Service: Orthopedics;  Laterality: Right;    There were no vitals filed for this visit.  Subjective Assessment - 07/22/18 0811    Subjective  Doing well. Thinks her walking is getting better. Still has the most problems with bending the Rt knee.     Currently in Pain?  No/denies         [facility] PT Assessment - 07/22/18 0001      Assessment   Medical Diagnosis  Rt partial knee replacement     Referring Provider  (PT)  Dr Carter Kitten     Onset Date/Surgical Date  06/18/18    Hand Dominance  Right    Next MD Visit  07/31/18    Prior Therapy  in hospital       AROM   Right Knee Extension  0      PROM   Right Knee Flexion  110                   [facility] Adult PT Treatment/Exercise - 07/22/18 0001      Knee/Hip Exercises: Stretches   Other Knee/Hip Stretches  rolling large green ball working on knee flexion 5 sec hold x 10;       Knee/Hip Exercises: Aerobic   Recumbent Bike  full revolution initially backward then forward ~ 6 min on bike       Knee/Hip Exercises: Standing   Hip Abduction  AROM;Stengthening;Right;Left;10 reps    Hip Extension  AROM;Stengthening;Right;Left;2 sets;10 reps    Lateral Step Up  Right;1 set;10 reps;Hand Hold: 2;Step Height: 6"    Forward Step Up  Right;1 set;10 reps;Hand Hold: 2;Step Height: 6"    Step Down  Left;1 set;10 reps;Hand Hold: 2;Step Height: 6"  and retro step up with RLE     Knee/Hip Exercises: Seated   Sit to Sand  1 set;5 reps;without UE support      Knee/Hip Exercises: Supine   Quad Sets  Strengthening;Right;1 set;10 reps   10 sec hold    Short Arc Quad Sets  Strengthening;Right;10 reps   5 sec hold    Straight Leg Raises  AROM;Strengthening;Right;10 reps   5 sec hold      Moist Heat Therapy   Number Minutes Moist Heat  12 Minutes    Moist Heat Location  Knee   Rt quad      Cryotherapy   Number Minutes Cryotherapy  12 Minutes    Cryotherapy Location  Knee   Rt    Type of Cryotherapy  Ice pack      Manual Therapy   Passive ROM  joint mobs and assisted knee flexion pt supine                PT Short Term Goals - 06/24/18 1354      PT SHORT TERM GOAL #1   Title  Independent in initial HEP 08/05/18    Time  6    Period  Weeks    Status  New      PT SHORT TERM GOAL #2   Title  Abulation with least assistive device with good gait pattern 08/05/18    Time  6    Period  Weeks    Status  New        PT Long Term  Goals - 06/24/18 1359      PT LONG TERM GOAL #1   Title  Increased AROM Rt knee 100 deg flexion to 0 deg exension 09/16/18    Time  12    Period  Weeks    Status  New      PT LONG TERM GOAL #2   Title  Increased strength to 4+/5 to 5/5 Rt LE 09/16/18    Time  12    Period  Weeks      PT LONG TERM GOAL #3   Title  Ambulation for community distances without assistive device or [facility] 09/16/18    Time  12    Period  Weeks    Status  New      PT LONG TERM GOAL #4   Title  Independent in HEP 09/16/18    Time  6    Period  Weeks    Status  New      PT LONG TERM GOAL #5   Title  Improve FOTO to </= 56% limitation 09/16/18    Time  6    Period  Weeks    Status  New            Plan - 07/22/18 2706    Clinical Impression Statement  Continued gradual improvement in ROM and gait. Gained 2 deg in knee flexion with PT assist. Patient tolerates exercise well and continues to progress toward stated goals of therapy.     Rehab Potential  Good    PT Frequency  2x / week    PT Duration  12 weeks    PT Treatment/Interventions  Patient/family education;ADLs/Self Care Home Management;Cryotherapy;Iontophoresis 65m/ml Dexamethasone;Moist Heat;Scar mobilization;Passive range of motion;Dry needling;Manual techniques;Balance training;Gait training;Neuromuscular re-education;Therapeutic activities;Therapeutic exercise;Functional mobility training    PT Next Visit Plan  continue progressive ROM and strengthening exercises for Rt knee; gait training; add balance exercise to HEP.  10th visit note at  next visit     PT Home Exercise Plan  Access Code: [email or web address]    Consulted and Agree with Plan of Care  Patient       Patient will benefit from skilled therapeutic intervention in order to improve the following deficits and impairments:  Improper body mechanics, Pain, Increased muscle spasms, Decreased strength, Decreased mobility, Decreased range of motion, Abnormal gait, Decreased activity tolerance, Decreased  balance  Visit Diagnosis: Acute pain of right knee  Other symptoms and signs involving the musculoskeletal system  Muscle weakness (generalized)  Other abnormalities of gait and mobility     Problem List Patient Active Problem List   Diagnosis Date Noted  . Primary localized osteoarthritis of right knee 06/18/2018  . Status post right partial knee replacement 06/18/2018    Janani Chamber Nilda Simmer PT, MPH  07/22/2018, 8:48 AM  [facility] [place], [name], [address] Phone: [phone]   Fax:  [phone]  Name: [name] MRN: [id number] Date of Birth: 1935/04/26

## 2018-07-25 ENCOUNTER — Encounter: Payer: Self-pay | Admitting: Rehabilitative and Restorative Service Providers"

## 2018-07-25 ENCOUNTER — Ambulatory Visit (INDEPENDENT_AMBULATORY_CARE_PROVIDER_SITE_OTHER): Payer: Medicare Other | Admitting: Rehabilitative and Restorative Service Providers"

## 2018-07-25 DIAGNOSIS — R29898 Other symptoms and signs involving the musculoskeletal system: Secondary | ICD-10-CM

## 2018-07-25 DIAGNOSIS — M25561 Pain in right knee: Secondary | ICD-10-CM

## 2018-07-25 DIAGNOSIS — R2689 Other abnormalities of gait and mobility: Secondary | ICD-10-CM

## 2018-07-25 DIAGNOSIS — M6281 Muscle weakness (generalized): Secondary | ICD-10-CM | POA: Diagnosis not present

## 2018-07-25 NOTE — Therapy (Signed)
[place], [name], [address] Phone: [phone]   Fax:  [phone]  Physical Therapy Treatment  Progress Note Reporting Period 06/24/18 to 07/25/2018  See note below for Objective Data and Assessment of Progress/Goals.       Patient Details  Name: [name] MRN: [id number] Date of Birth: 01-19-1935 Referring Provider (PT): Dr Carter Kitten    Encounter Date: 07/25/2018  PT End of Session - 07/25/18 0808    Visit Number  10    Number of Visits  24    Date for PT Re-Evaluation  09/23/18    PT Start Time  0802    PT Stop Time  5916    PT Time Calculation (min)  52 min    Activity Tolerance  Patient tolerated treatment well       Past Medical History:  Diagnosis Date  . Arthritis   . Asthma   . Chronic sinusitis   . Complication of anesthesia   . Family history of adverse reaction to anesthesia    sister is hard to wake up from Anesthesia  . Follicular lymphoma ([place])    received Chemotherapy (2 years ago)  and radiation (last 12/2017)  . Hyperlipidemia   . Hypertension   . Hypothyroidism   . Meniere's disease   . PONV (postoperative nausea and vomiting)   . Primary localized osteoarthritis of right knee 06/18/2018  . Reflux     Past Surgical History:  Procedure Laterality Date  . ABDOMINAL HYSTERECTOMY    . BONE MARROW BIOPSY  06/16/2014   at [place]     bilateral  . CHOLECYSTECTOMY    . KNEE ARTHROSCOPY    . PARTIAL KNEE ARTHROPLASTY Right 06/18/2018   Procedure: UNICOMPARTMENTAL KNEE;  Surgeon: Marchia Bond, MD;  [facility];  Service: Orthopedics;  Laterality: Right;    There were no vitals filed for this visit.  Subjective Assessment - 07/25/18 0809    Subjective  Increased pain following last visit - pain lasting for a couple of days. Maybe from going around on the bike forward?     Currently in Pain?  Yes    Pain Score  3      Pain Location  Knee    Pain Orientation  Right    Pain Descriptors / Indicators  Aching;Nagging    Pain Type  Surgical pain;Chronic pain    Pain Onset  More than a month ago    Pain Frequency  Intermittent         [facility] PT Assessment - 07/25/18 0001      Assessment   Medical Diagnosis  Rt partial knee replacement     Referring Provider (PT)  Dr Carter Kitten     Onset Date/Surgical Date  06/18/18    Hand Dominance  Right    Next MD Visit  07/31/18    Prior Therapy  in hospital       Observation/Other Assessments   Focus on Therapeutic Outcomes (FOTO)   62% limitation       AROM   Right Knee Extension  0      PROM   Right Knee Flexion  110      Strength   Right Hip Flexion  4/5    Right Hip Extension  4/5    Right Hip ABduction  4/5    Right Hip ADduction  4+/5    Right Knee Flexion  4+/5  Right Knee Extension  4/5      Flexibility   Hamstrings  tight Rt > Lt    Quadriceps  tight Rt > Lt     ITB  tight Rt > Lt     Piriformis  tight Rt > Lt                   [facility] Adult PT Treatment/Exercise - 07/25/18 0001      Knee/Hip Exercises: Aerobic   Recumbent Bike  partial and full revolution backwards x 6 min       Knee/Hip Exercises: Standing   Knee Flexion  AROM;Right;Left;20 reps   tapping toe to 12 inch step    Hip Abduction  AROM;Stengthening;Right;Left;10 reps;2 sets    Hip Extension  AROM;Stengthening;Right;Left;2 sets;10 reps    Lateral Step Up  Right;1 set;10 reps;Hand Hold: 2;Step Height: 6"    Forward Step Up  Right;1 set;Hand Hold: 2;Step Height: 6";15 reps    Gait Training  160 ft with RW with cues for release of knee during swing through and increased heel strike; improved gait quality with cues and increased distance. 3 reps.        Knee/Hip Exercises: Seated   Sit to Sand  1 set;5 reps;without UE support      Knee/Hip Exercises: Supine   Quad Sets  Strengthening;Right;1 set;10 reps   10 sec hold    Short Arc Quad Sets   Strengthening;Right;10 reps   5 sec hold    Heel Slides  AAROM;Right;5 reps   with strap    Straight Leg Raises  AROM;Strengthening;Right;10 reps   5 sec hold    Patellar Mobs  lateral to medially       Moist Heat Therapy   Number Minutes Moist Heat  15 Minutes    Moist Heat Location  Knee   Rt quad      Vasopneumatic   Number Minutes Vasopneumatic   15 minutes    Vasopnuematic Location   Knee   Rt    Vasopneumatic Pressure  Low    Vasopneumatic Temperature   34 deg      Manual Therapy   Manual therapy comments  pt supine     Soft tissue mobilization  IASTM distal quad focus on medial quad; patellar mobs     Myofascial Release  distal quad                PT Short Term Goals - 06/24/18 1354      PT SHORT TERM GOAL #1   Title  Independent in initial HEP 08/05/18    Time  6    Period  Weeks    Status  New      PT SHORT TERM GOAL #2   Title  Abulation with least assistive device with good gait pattern 08/05/18    Time  6    Period  Weeks    Status  New        PT Long Term Goals - 06/24/18 1359      PT LONG TERM GOAL #1   Title  Increased AROM Rt knee 100 deg flexion to 0 deg exension 09/16/18    Time  12    Period  Weeks    Status  New      PT LONG TERM GOAL #2   Title  Increased strength to 4+/5 to 5/5 Rt LE 09/16/18    Time  12    Period  Weeks  PT LONG TERM GOAL #3   Title  Ambulation for community distances without assistive device or [facility] 09/16/18    Time  12    Period  Weeks    Status  New      PT LONG TERM GOAL #4   Title  Independent in HEP 09/16/18    Time  6    Period  Weeks    Status  New      PT LONG TERM GOAL #5   Title  Improve FOTO to </= 56% limitation 09/16/18    Time  6    Period  Weeks    Status  New            Plan - 07/25/18 0809    Clinical Impression Statement  [name] returns today with increased pain in medial knee area. She has no change in ROM compared to previous visit but good improvement in FOTO score and  reports increased functional activity level at home. She continues to progress gradually toward stated goals of rehab.  patient will contact oncologist to ask if she can use TENS unit she has for knee pain.     Rehab Potential  Good    PT Frequency  2x / week    PT Duration  12 weeks    PT Treatment/Interventions  Patient/family education;ADLs/Self Care Home Management;Cryotherapy;Iontophoresis 42m/ml Dexamethasone;Moist Heat;Scar mobilization;Passive range of motion;Dry needling;Manual techniques;Balance training;Gait training;Neuromuscular re-education;Therapeutic activities;Therapeutic exercise;Functional mobility training    PT Next Visit Plan  continue progressive ROM and strengthening exercises for Rt knee; gait training; add balance exercise to HEP. note to MD     PT Home Exercise Plan  Access Code: [email or web address]    Consulted and Agree with Plan of Care  Patient       Patient will benefit from skilled therapeutic intervention in order to improve the following deficits and impairments:  Improper body mechanics, Pain, Increased muscle spasms, Decreased strength, Decreased mobility, Decreased range of motion, Abnormal gait, Decreased activity tolerance, Decreased balance  Visit Diagnosis: Acute pain of right knee  Other symptoms and signs involving the musculoskeletal system  Muscle weakness (generalized)  Other abnormalities of gait and mobility     Problem List Patient Active Problem List   Diagnosis Date Noted  . Primary localized osteoarthritis of right knee 06/18/2018  . Status post right partial knee replacement 06/18/2018    [name] PNilda SimmerPT, MPH  07/25/2018, 8:56 AM  [facility] [address]Phone: [phone]  Fax:  [phone] Name: Jillian SpragueMRN: [id number] of Birth: [birth year]

## 2018-07-29 ENCOUNTER — Encounter: Payer: Medicare Other | Admitting: Physical Therapy

## 2018-07-30 ENCOUNTER — Ambulatory Visit (INDEPENDENT_AMBULATORY_CARE_PROVIDER_SITE_OTHER): Payer: Medicare Other | Admitting: Physical Therapy

## 2018-07-30 ENCOUNTER — Encounter: Payer: Self-pay | Admitting: Physical Therapy

## 2018-07-30 DIAGNOSIS — M25561 Pain in right knee: Secondary | ICD-10-CM

## 2018-07-30 DIAGNOSIS — R29898 Other symptoms and signs involving the musculoskeletal system: Secondary | ICD-10-CM

## 2018-07-30 DIAGNOSIS — M6281 Muscle weakness (generalized): Secondary | ICD-10-CM | POA: Diagnosis not present

## 2018-07-30 NOTE — Therapy (Addendum)
[place], [name], [address] Phone: [phone]   Fax:  [phone]  Physical Therapy Treatment  Patient Details  Name: [name] MRN: [id number] Date of Birth: Apr 10, 1935 Referring Provider (PT): Dr Carter Kitten    Encounter Date: 07/30/2018  PT End of Session - 07/30/18 0808    Visit Number  11    Number of Visits  24    Date for PT Re-Evaluation  09/23/18    PT Start Time  0804    PT Stop Time  0859    PT Time Calculation (min)  55 min    Activity Tolerance  Patient tolerated treatment well    Behavior During Therapy  [facility] for tasks assessed/performed       Past Medical History:  Diagnosis Date  . Arthritis   . Asthma   . Chronic sinusitis   . Complication of anesthesia   . Family history of adverse reaction to anesthesia    sister is hard to wake up from Anesthesia  . Follicular lymphoma ([place])    received Chemotherapy (2 years ago)  and radiation (last 12/2017)  . Hyperlipidemia   . Hypertension   . Hypothyroidism   . Meniere's disease   . PONV (postoperative nausea and vomiting)   . Primary localized osteoarthritis of right knee 06/18/2018  . Reflux     Past Surgical History:  Procedure Laterality Date  . ABDOMINAL HYSTERECTOMY    . BONE MARROW BIOPSY  06/16/2014   at [place]     bilateral  . CHOLECYSTECTOMY    . KNEE ARTHROSCOPY    . PARTIAL KNEE ARTHROPLASTY Right 06/18/2018   Procedure: UNICOMPARTMENTAL KNEE;  Surgeon: Marchia Bond, MD;  Location: [facility];  Service: Orthopedics;  Laterality: Right;    There were no vitals filed for this visit.  Subjective Assessment - 07/30/18 1013    Subjective  Pt returns to MD tomorrow.  "How much longer do I need to keep coming up here?"  Pt reports frustration of continued stiffness in Rt knee and complains that her Lt knee is bothering her more these days.     Patient Stated Goals  walk normal  again; get in and out of chair and on off toilet more easily     Currently in Pain?  Yes    Pain Score  1     Pain Location  Knee    Pain Orientation  Right    Pain Descriptors / Indicators  Aching;Tightness    Aggravating Factors   first moments after standing up     Pain Relieving Factors  ice, massage         [facility] PT Assessment - 07/30/18 0001      Assessment   Medical Diagnosis  Rt partial knee replacement     Referring Provider (PT)  Dr Carter Kitten     Onset Date/Surgical Date  06/18/18    Hand Dominance  Right    Next MD Visit  07/31/18    Prior Therapy  in hospital        [facility] Adult PT Treatment/Exercise - 07/30/18 0001      Knee/Hip Exercises: Stretches   Passive Hamstring Stretch  Right;2 reps;30 seconds    Quad Stretch  Right;3 reps;Left;2 reps;30 seconds   prone with strap   Gastroc Stretch  Both;2 reps;30 seconds   incline board     Knee/Hip Exercises: Aerobic   Nustep  L5 x 5 min for ROM     Other Aerobic  single laps around gym in between exercises to decrease stiffness.       Knee/Hip Exercises: Standing   Lateral Step Up  Right;1 set;10 reps;Hand Hold: 2;Step Height: 6"    Forward Step Up  Right;1 set;Hand Hold: 2;Step Height: 6";15 reps    Step Down  Left;1 set;10 reps;Hand Hold: 2;Step Height: 6"   and retro step up with RLE   SLS  SLS on blue pad x 30 sec x 2 reps each leg, occasional UE to steady.     Other Standing Knee Exercises  tandem stance on blue pads x 30 sec x 2 reps each foot forward, occasional UE to steady      Knee/Hip Exercises: Seated   Sit to Sand  1 set;without UE support;10 reps   Left foot forward, blue chair     Moist Heat Therapy   Number Minutes Moist Heat  15 Minutes    Moist Heat Location  Knee   Lt knee     Vasopneumatic   Number Minutes Vasopneumatic   15 minutes    Vasopnuematic Location   Knee   Rt    Vasopneumatic Pressure  Low    Vasopneumatic Temperature   34 deg               PT Short Term Goals  - 07/30/18 1015      PT SHORT TERM GOAL #1   Title  Independent in initial HEP 08/05/18    Time  6    Period  Weeks    Status  Achieved      PT SHORT TERM GOAL #2   Title  Abulation with least assistive device with good gait pattern 08/05/18    Time  6    Period  Weeks    Status  Partially Met        PT Long Term Goals - 07/30/18 0836      PT LONG TERM GOAL #1   Title  Increased AROM Rt knee 100 deg flexion to 0 deg exension 09/16/18    Time  12    Period  Weeks    Status  Achieved      PT LONG TERM GOAL #2   Title  Increased strength to 4+/5 to 5/5 Rt LE 09/16/18    Time  12    Period  Weeks    Status  Partially Met      PT LONG TERM GOAL #3   Title  Ambulation for community distances without assistive device or [facility] 09/16/18    Time  12    Period  Weeks    Status  Partially Met   uses walker for community distances when tired     PT LONG TERM GOAL #4   Title  Independent in HEP 09/16/18    Time  6    Period  Weeks    Status  On-going      PT LONG TERM GOAL #5   Title  Improve FOTO to </= 56% limitation 09/16/18    Time  6    Period  Weeks    Status  On-going            Plan - 07/30/18 0837    Clinical Impression Statement  Pt presents to therapy ambulating without AD; she has weaned off the RW, only using it if tired due to long distance. She tolerated all exercises well, with  minimal increase discomfort.  She has partially met her goals and will benefit from continued PT intervention to maximize functional mobility and safety.     Rehab Potential  Good    PT Frequency  2x / week    PT Duration  12 weeks    PT Treatment/Interventions  Patient/family education;ADLs/Self Care Home Management;Cryotherapy;Iontophoresis 30m/ml Dexamethasone;Moist Heat;Scar mobilization;Passive range of motion;Dry needling;Manual techniques;Balance training;Gait training;Neuromuscular re-education;Therapeutic activities;Therapeutic exercise;Functional mobility training    PT Next Visit  Plan  continue progressive ROM and strengthening exercises for Rt knee; gait training.    PT Home Exercise Plan  Access Code: [email or web address]    Recommended Other Services  no modalities other than ice/ heat    Consulted and Agree with Plan of Care  Patient       Patient will benefit from skilled therapeutic intervention in order to improve the following deficits and impairments:  Improper body mechanics, Pain, Increased muscle spasms, Decreased strength, Decreased mobility, Decreased range of motion, Abnormal gait, Decreased activity tolerance, Decreased balance  Visit Diagnosis: Acute pain of right knee  Other symptoms and signs involving the musculoskeletal system  Muscle weakness (generalized)     Problem List Patient Active Problem List   Diagnosis Date Noted  . Primary localized osteoarthritis of right knee 06/18/2018  . Status post right partial knee replacement 06/18/2018   JKerin Perna PTA 07/30/18 10:16 AM  [address] [place] [address]Phone: [phone]  Fax:  [phone] Name: Jillian MarkoffMRN: [id number] of Birth: [birth year] PHYSICAL THERAPY DISCHARGE SUMMARY  Visits from Start of Care: 11  Current functional level related to goals / functional outcomes: See progress note for discharge status    Remaining deficits: Needs to continue to work on ROM and strength    Education / Equipment: HEP  Plan: Patient agrees to discharge.  Patient goals were partially met. Patient is being discharged due to being pleased with the current functional level.  ?????     Celyn P. HHelene KelpPT, MPH 08/16/18 10:45 AM

## 2018-08-01 ENCOUNTER — Encounter: Payer: Medicare Other | Admitting: Rehabilitative and Restorative Service Providers"

## 2018-08-07 DIAGNOSIS — M1712 Unilateral primary osteoarthritis, left knee: Secondary | ICD-10-CM | POA: Diagnosis not present

## 2018-08-08 DIAGNOSIS — C8232 Follicular lymphoma grade IIIa, intrathoracic lymph nodes: Secondary | ICD-10-CM | POA: Diagnosis not present

## 2018-08-08 DIAGNOSIS — I10 Essential (primary) hypertension: Secondary | ICD-10-CM | POA: Diagnosis not present

## 2018-08-08 DIAGNOSIS — R0609 Other forms of dyspnea: Secondary | ICD-10-CM | POA: Diagnosis not present

## 2018-08-08 DIAGNOSIS — K219 Gastro-esophageal reflux disease without esophagitis: Secondary | ICD-10-CM | POA: Diagnosis not present

## 2018-08-08 DIAGNOSIS — E785 Hyperlipidemia, unspecified: Secondary | ICD-10-CM | POA: Diagnosis not present

## 2018-08-08 DIAGNOSIS — E039 Hypothyroidism, unspecified: Secondary | ICD-10-CM | POA: Diagnosis not present

## 2018-09-03 ENCOUNTER — Encounter: Payer: Self-pay | Admitting: Osteopathic Medicine

## 2018-09-03 ENCOUNTER — Ambulatory Visit (INDEPENDENT_AMBULATORY_CARE_PROVIDER_SITE_OTHER): Payer: Medicare Other | Admitting: Osteopathic Medicine

## 2018-09-03 VITALS — BP 136/57 | HR 67 | Temp 98.7°F | Ht 65.0 in | Wt 187.6 lb

## 2018-09-03 DIAGNOSIS — I480 Paroxysmal atrial fibrillation: Secondary | ICD-10-CM

## 2018-09-03 DIAGNOSIS — L84 Corns and callosities: Secondary | ICD-10-CM

## 2018-09-03 DIAGNOSIS — E039 Hypothyroidism, unspecified: Secondary | ICD-10-CM | POA: Diagnosis not present

## 2018-09-03 DIAGNOSIS — K219 Gastro-esophageal reflux disease without esophagitis: Secondary | ICD-10-CM | POA: Diagnosis not present

## 2018-09-03 DIAGNOSIS — R42 Dizziness and giddiness: Secondary | ICD-10-CM | POA: Insufficient documentation

## 2018-09-03 DIAGNOSIS — R5383 Other fatigue: Secondary | ICD-10-CM | POA: Diagnosis not present

## 2018-09-03 DIAGNOSIS — L723 Sebaceous cyst: Secondary | ICD-10-CM | POA: Insufficient documentation

## 2018-09-03 DIAGNOSIS — I10 Essential (primary) hypertension: Secondary | ICD-10-CM

## 2018-09-03 DIAGNOSIS — M791 Myalgia, unspecified site: Secondary | ICD-10-CM | POA: Insufficient documentation

## 2018-09-03 HISTORY — DX: Corns and callosities: L84

## 2018-09-03 HISTORY — DX: Sebaceous cyst: L72.3

## 2018-09-03 MED ORDER — TRIAMCINOLONE ACETONIDE 55 MCG/ACT NA AERO: 2.0000 | INHALATION_SPRAY | Freq: Every day | NASAL | 3 refills | Status: DC

## 2018-09-03 MED ORDER — LOSARTAN POTASSIUM 100 MG PO TABS: 100.0000 mg | ORAL_TABLET | Freq: Every day | ORAL | 3 refills | Status: DC

## 2018-09-03 MED ORDER — ISOSORBIDE MONONITRATE ER 30 MG PO TB24: 30.0000 mg | ORAL_TABLET | Freq: Every day | ORAL | 3 refills | Status: DC

## 2018-09-03 MED ORDER — METOPROLOL TARTRATE 100 MG PO TABS: 100.0000 mg | ORAL_TABLET | Freq: Two times a day (BID) | ORAL | 3 refills | Status: DC

## 2018-09-03 MED ORDER — LEVOTHYROXINE SODIUM 100 MCG PO TABS: 100.0000 ug | ORAL_TABLET | Freq: Every day | ORAL | 3 refills | Status: DC

## 2018-09-03 MED ORDER — AMLODIPINE BESYLATE 5 MG PO TABS: 5.0000 mg | ORAL_TABLET | Freq: Every day | ORAL | 3 refills | Status: DC

## 2018-09-03 MED ORDER — ONDANSETRON HCL 4 MG PO TABS: 4.0000 mg | ORAL_TABLET | Freq: Three times a day (TID) | ORAL | 0 refills | Status: DC | PRN

## 2018-09-03 MED ORDER — HYDROCHLOROTHIAZIDE 25 MG PO TABS: 25.0000 mg | ORAL_TABLET | Freq: Every day | ORAL | 3 refills | Status: DC

## 2018-09-03 MED ORDER — FLUTICASONE PROPIONATE HFA 110 MCG/ACT IN AERO: 2.0000 | INHALATION_SPRAY | Freq: Two times a day (BID) | RESPIRATORY_TRACT | 3 refills | Status: DC | PRN

## 2018-09-03 MED ORDER — OMEPRAZOLE 40 MG PO CPDR: 40.0000 mg | DELAYED_RELEASE_CAPSULE | Freq: Every day | ORAL | 3 refills | Status: DC

## 2018-09-03 MED ORDER — PRAVASTATIN SODIUM 80 MG PO TABS: 80.0000 mg | ORAL_TABLET | Freq: Every day | ORAL | 3 refills | Status: DC

## 2018-09-03 MED ORDER — ACYCLOVIR 5 % EX OINT: 1.0000 "application " | TOPICAL_OINTMENT | Freq: Every day | CUTANEOUS | 3 refills | Status: DC | PRN

## 2018-09-03 NOTE — Progress Notes (Signed)
HPI: [name] is a 84 y.o. female who  has a past medical history of Arthritis, Asthma, Atrial fibrillation ([facility]), Chronic sinusitis, Complication of anesthesia, Family history of adverse reaction to anesthesia, Follicular lymphoma ([place]), Hyperlipidemia, Hypertension, Hypothyroidism, Meniere's disease, PONV (postoperative nausea and vomiting), Primary localized osteoarthritis of right knee (06/18/2018), and Reflux.  she presents to [facility] today, 09/03/18,  for chief complaint of: New to establish Thyroid R toe  Skin problem  Toe on R foot: Reports some discoloration on big toe and occasional numbness.  Has a callus in this area that she reports was all black yesterday, seems to have resolved at this point, no pain, normal range of motion to toe.  Concern for spot on middle of upper back, question sebaceous cyst.  Nonpainful, occasionally itchy.  Thyroid hx: Hx hypothyroid in chart, last TSH on file was 14.2 on 08/08/2018. Synthroid dose on file 88 mcg, looks like HemOnc increased this to 100 mcg 08/14/2018 and pt aware.  Patient confirms that this is the dose that she is taking.  Hx non-hodgkin's lymphoma: [facility].    Patient is accompanied by daughter, Cyril Mourning, who assists with history-taking.   Past medical, surgical, social and family history reviewed:  Patient Active Problem List   Diagnosis Date Noted  . Fatigue 09/03/2018  . Myalgia 09/03/2018  . Vertigo 09/03/2018  . Sebaceous cyst 09/03/2018  . Callus of foot 09/03/2018  . Primary localized osteoarthritis of right knee 06/18/2018  . Status post right partial knee replacement 06/18/2018  . Nasal polyposis 05/16/2018  . Class 1 obesity due to excess calories with serious comorbidity and body mass index (BMI) of 30.0 to 30.9 in adult 07/16/2017  . DDD (degenerative disc disease), cervical 07/16/2017  . GERD (gastroesophageal reflux disease) 07/16/2017  .  Other hyperlipidemia 09/27/2016  . Paroxysmal atrial fibrillation ([place]) 09/27/2016  . Benign essential hypertension 09/15/2015  . Follicular lymphoma ([place]) 12/18/2014  . Hypothyroidism 01/14/2014  . Carpal tunnel syndrome on both sides 06/30/2013  . Dyspnea 12/25/2012    Past Surgical History:  Procedure Laterality Date  . ABDOMINAL HYSTERECTOMY    . BONE MARROW BIOPSY  06/16/2014   at [place]     bilateral  . CHOLECYSTECTOMY    . KNEE ARTHROSCOPY    . PARTIAL KNEE ARTHROPLASTY Right 06/18/2018   Procedure: UNICOMPARTMENTAL KNEE;  Surgeon: Marchia Bond, MD;  [facility];  Service: Orthopedics;  Laterality: Right;    Social History   Tobacco Use  . Smoking status: Never Smoker  . Smokeless tobacco: Never Used  Substance Use Topics  . Alcohol use: No    Family History  Problem Relation Age of Onset  . Heart disease Other   . Skin cancer Mother   . Stroke Mother   . Prostate cancer Father   . High blood pressure Father   . Heart attack Father      Current medication list and allergy/intolerance information reviewed:    Current Outpatient Medications  Medication Sig Dispense Refill  . acyclovir ointment (ZOVIRAX) 5 % Apply 1 application topically daily as needed (for cold sores). 15 g 3  . amLODipine (NORVASC) 5 MG tablet Take 1 tablet (5 mg total) by mouth daily. 90 tablet 3  . aspirin EC 325 MG tablet Take 1 tablet (325 mg total) by mouth 2 (two) times daily. 60 tablet 0  . baclofen (LIORESAL) 10 MG tablet Take 1 tablet (  10 mg total) by mouth 3 (three) times daily. As needed for muscle spasm 50 tablet 0  . CALCIUM-MAGNESIUM-ZINC PO Take 1 tablet by mouth daily.    . fluticasone (FLOVENT HFA) 110 MCG/ACT inhaler Inhale 2 puffs into the lungs 2 (two) times daily as needed (for shortness of breath or wheezing). 3 Inhaler 3  . hydrochlorothiazide (HYDRODIURIL) 25 MG tablet Take 1 tablet (25 mg total) by mouth daily. 90 tablet 3  .  HYDROcodone-acetaminophen (NORCO) 10-325 MG tablet Take 1 tablet by mouth every 6 (six) hours as needed. 28 tablet 0  . isosorbide mononitrate (IMDUR) 30 MG 24 hr tablet Take 1 tablet (30 mg total) by mouth daily. 90 tablet 3  . levothyroxine (SYNTHROID, LEVOTHROID) 100 MCG tablet Take 1 tablet (100 mcg total) by mouth daily before breakfast. 90 tablet 3  . losartan (COZAAR) 100 MG tablet Take 1 tablet (100 mg total) by mouth at bedtime. 90 tablet 3  . metoprolol tartrate (LOPRESSOR) 100 MG tablet Take 1 tablet (100 mg total) by mouth 2 (two) times daily. 180 tablet 3  . omeprazole (PRILOSEC) 40 MG capsule Take 1 capsule (40 mg total) by mouth daily. 90 capsule 3  . ondansetron (ZOFRAN) 4 MG tablet Take 1 tablet (4 mg total) by mouth every 8 (eight) hours as needed for nausea or vomiting. 10 tablet 0  . pravastatin (PRAVACHOL) 80 MG tablet Take 1 tablet (80 mg total) by mouth at bedtime. 90 tablet 3  . sennosides-docusate sodium (SENOKOT-S) 8.6-50 MG tablet Take 2 tablets by mouth daily. 30 tablet 1  . tetrahydrozoline (VISINE) 0.05 % ophthalmic solution Place 2 drops into both eyes daily as needed (for dry eyes).    . triamcinolone (NASACORT ALLERGY 24HR) 55 MCG/ACT AERO nasal inhaler Place 2 sprays into the nose daily. 3 Inhaler 3   No current facility-administered medications for this visit.     No Known Allergies    Review of Systems:  Constitutional:  No  fever, no chills, No recent illness, No unintentional weight changes. +significant fatigue.   HEENT: +headache, no vision change, no hearing change, +sore throat, +sinus pressure  Cardiac: No  chest pain, No  pressure, No palpitations, No  Orthopnea  Respiratory:  +shortness of breath. +Cough  Gastrointestinal: No  abdominal pain, No  nausea, No  vomiting,  No  blood in stool, +diarrhea, No  constipation   Musculoskeletal: +myalgia/arthralgia  Skin: No  Rash, +other wounds/concerning lesions  Genitourinary: No  incontinence,  No  abnormal genital bleeding, No abnormal genital discharge  Hem/Onc: +easy bruising/bleeding, No  abnormal lymph node  Endocrine: No cold intolerance,  +heat intolerance. No polyuria/polydipsia/polyphagia   Neurologic: No  weakness, No  dizziness, No  slurred speech/focal weakness/facial droop, +numbness/tingling R toe per HPI  Psychiatric: No  concerns with depression, No  concerns with anxiety, +sleep problems, No mood problems  Exam:  BP (!) 136/57 (BP Location: Left Arm, Patient Position: Sitting, Cuff Size: Normal)   Pulse 67   Temp 98.7 F (37.1 C) (Oral)   Ht '5\' 5"'  (1.651 m)   Wt 187 lb 9.6 oz (85.1 kg)   BMI 31.22 kg/m   Constitutional: VS see above. General Appearance: alert, well-developed, well-nourished, NAD  Eyes: Normal lids and conjunctive, non-icteric sclera  Ears, Nose, Mouth, Throat: MMM, Normal external inspection ears/nares/mouth/lips/gums.    Neck: No masses, trachea midline. No thyroid enlargement.  Respiratory: Normal respiratory effort. no wheeze, no rhonchi, no rales  Cardiovascular: S1/S2 normal, no murmur, no  rub/gallop auscultated. RRR. No lower extremity edema.  Gastrointestinal: Nontender, no masses.  Musculoskeletal: Gait normal. No clubbing/cyanosis of digits.   Neurological: Normal balance/coordination. No tremor.  Skin: warm, dry, intact. No rash/ulcer.  Callus on medial right great toe, I do not see any discoloration, normal sensation to the rest of the toe, normal range of motion.  Sebaceous cyst with easy expression of sebaceous material in upper mid back, nonpainful,  Psychiatric: Normal judgment/insight. Normal mood and affect. Oriented x3.       ASSESSMENT/PLAN: The primary encounter diagnosis was Hypothyroidism, unspecified type. Diagnoses of Fatigue, unspecified type, Gastroesophageal reflux disease without esophagitis, Paroxysmal atrial fibrillation (H[facility]), Benign essential hypertension, Sebaceous cyst, and Callus of foot were  also pertinent to this visit.   Orders Placed This Encounter  Procedures  . TSH    Meds ordered this encounter  Medications  . acyclovir ointment (ZOVIRAX) 5 %    Sig: Apply 1 application topically daily as needed (for cold sores).    Dispense:  15 g    Refill:  3  . amLODipine (NORVASC) 5 MG tablet    Sig: Take 1 tablet (5 mg total) by mouth daily.    Dispense:  90 tablet    Refill:  3  . fluticasone (FLOVENT HFA) 110 MCG/ACT inhaler    Sig: Inhale 2 puffs into the lungs 2 (two) times daily as needed (for shortness of breath or wheezing).    Dispense:  3 Inhaler    Refill:  3  . hydrochlorothiazide (HYDRODIURIL) 25 MG tablet    Sig: Take 1 tablet (25 mg total) by mouth daily.    Dispense:  90 tablet    Refill:  3  . isosorbide mononitrate (IMDUR) 30 MG 24 hr tablet    Sig: Take 1 tablet (30 mg total) by mouth daily.    Dispense:  90 tablet    Refill:  3  . levothyroxine (SYNTHROID, LEVOTHROID) 100 MCG tablet    Sig: Take 1 tablet (100 mcg total) by mouth daily before breakfast.    Dispense:  90 tablet    Refill:  3  . losartan (COZAAR) 100 MG tablet    Sig: Take 1 tablet (100 mg total) by mouth at bedtime.    Dispense:  90 tablet    Refill:  3  . metoprolol tartrate (LOPRESSOR) 100 MG tablet    Sig: Take 1 tablet (100 mg total) by mouth 2 (two) times daily.    Dispense:  180 tablet    Refill:  3  . omeprazole (PRILOSEC) 40 MG capsule    Sig: Take 1 capsule (40 mg total) by mouth daily.    Dispense:  90 capsule    Refill:  3  . ondansetron (ZOFRAN) 4 MG tablet    Sig: Take 1 tablet (4 mg total) by mouth every 8 (eight) hours as needed for nausea or vomiting.    Dispense:  10 tablet    Refill:  0  . pravastatin (PRAVACHOL) 80 MG tablet    Sig: Take 1 tablet (80 mg total) by mouth at bedtime.    Dispense:  90 tablet    Refill:  3  . triamcinolone (NASACORT ALLERGY 24HR) 55 MCG/ACT AERO nasal inhaler    Sig: Place 2 sprays into the nose daily.    Dispense:  3  Inhaler    Refill:  3         Visit summary with medication list and pertinent instructions was printed for patient to  review. All questions at time of visit were answered - patient instructed to contact office with any additional concerns or updates. ER/RTC precautions were reviewed with the patient.      Please note: voice recognition software was used to produce this document, and typos may escape review. Please contact Dr. Sheppard Coil for any needed clarifications.     Follow-up plan: Return for LAB VISIT ONLY around 09/19/2018 recheck thyroid. See Dr A in 6 mos for medicare wellness .

## 2018-09-04 DIAGNOSIS — I480 Paroxysmal atrial fibrillation: Secondary | ICD-10-CM | POA: Diagnosis not present

## 2018-09-04 DIAGNOSIS — Z7952 Long term (current) use of systemic steroids: Secondary | ICD-10-CM | POA: Diagnosis not present

## 2018-09-04 DIAGNOSIS — C8232 Follicular lymphoma grade IIIa, intrathoracic lymph nodes: Secondary | ICD-10-CM | POA: Diagnosis not present

## 2018-09-04 DIAGNOSIS — R0609 Other forms of dyspnea: Secondary | ICD-10-CM | POA: Diagnosis not present

## 2018-09-04 DIAGNOSIS — Z7982 Long term (current) use of aspirin: Secondary | ICD-10-CM | POA: Diagnosis not present

## 2018-09-04 DIAGNOSIS — R0600 Dyspnea, unspecified: Secondary | ICD-10-CM | POA: Diagnosis not present

## 2018-09-04 DIAGNOSIS — Z79899 Other long term (current) drug therapy: Secondary | ICD-10-CM | POA: Diagnosis not present

## 2018-09-12 NOTE — Patient Instructions (Signed)
[name]  09/12/2018   Your procedure is scheduled on: 09-24-2018   Report to [facility] Main  Entrance     Report to [place] at 5:30AM    Call this number if you have problems the morning of surgery [phone]      Remember: Do not eat food or drink liquids :After Midnight. BRUSH YOUR TEETH MORNING OF SURGERY AND RINSE YOUR MOUTH OUT, NO CHEWING GUM CANDY OR MINTS.     Take these medicines the morning of surgery with A SIP OF WATER: METOPROLOL, ISOSORBIDE, LEVOTHYROXINE, OMEPRAZOLE, FLONASE IF NEEDED, NASACORT IF NEEDED                                 You may not have any metal on your body including hair pins and              piercings  Do not wear jewelry, make-up, lotions, powders or perfumes, deodorant             Do not wear nail polish.  Do not shave  48 hours prior to surgery.         Do not bring valuables to the hospital. [place].  Contacts, dentures or bridgework may not be worn into surgery.  Leave suitcase in the car. After surgery it may be brought to your room.                 Please read over the following fact sheets you were given: _____________________________________________________________________             [facility] - Preparing for Surgery Before surgery, you can play an important role.  Because skin is not sterile, your skin needs to be as free of germs as possible.  You can reduce the number of germs on your skin by washing with CHG (chlorahexidine gluconate) soap before surgery.  CHG is an antiseptic cleaner which kills germs and bonds with the skin to continue killing germs even after washing. Please DO NOT use if you have an allergy to CHG or antibacterial soaps.  If your skin becomes reddened/irritated stop using the CHG and inform your nurse when you arrive at Short Stay. Do not shave (including legs and underarms) for at least 48 hours prior to the first CHG  shower.  You may shave your face/neck. Please follow these instructions carefully:  1.  Shower with CHG Soap the night before surgery and the  morning of Surgery.  2.  If you choose to wash your hair, wash your hair first as usual with your  normal  shampoo.  3.  After you shampoo, rinse your hair and body thoroughly to remove the  shampoo.                           4.  Use CHG as you would any other liquid soap.  You can apply chg directly  to the skin and wash                       Gently with a scrungie or clean washcloth.  5.  Apply the CHG Soap to your body ONLY FROM THE  NECK DOWN.   Do not use on face/ open                           Wound or open sores. Avoid contact with eyes, ears mouth and genitals (private parts).                       Wash face,  Genitals (private parts) with your normal soap.             6.  Wash thoroughly, paying special attention to the area where your surgery  will be performed.  7.  Thoroughly rinse your body with warm water from the neck down.  8.  DO NOT shower/wash with your normal soap after using and rinsing off  the CHG Soap.                9.  Pat yourself dry with a clean towel.            10.  Wear clean pajamas.            11.  Place clean sheets on your bed the night of your first shower and do not  sleep with pets. Day of Surgery : Do not apply any lotions/deodorants the morning of surgery.  Please wear clean clothes to the hospital/surgery center.  FAILURE TO FOLLOW THESE INSTRUCTIONS MAY RESULT IN THE CANCELLATION OF YOUR SURGERY PATIENT SIGNATURE_________________________________  NURSE SIGNATURE__________________________________  ________________________________________________________________________

## 2018-09-12 NOTE — Progress Notes (Signed)
CT CHEST 08-08-2018 [facility]   EKG 06-04-18 Epic   LOV CARDIOLOGY DR. Carollee Massed [phone] [facility]   STRESS TEST 01-28-18 [facility]   ECHO 02-12-2017 [facility]

## 2018-09-13 ENCOUNTER — Other Ambulatory Visit: Payer: Self-pay | Admitting: Orthopedic Surgery

## 2018-09-13 ENCOUNTER — Other Ambulatory Visit: Payer: Self-pay

## 2018-09-13 ENCOUNTER — Telehealth: Payer: Self-pay

## 2018-09-13 ENCOUNTER — Encounter (HOSPITAL_COMMUNITY): Payer: Self-pay

## 2018-09-13 ENCOUNTER — Encounter (HOSPITAL_COMMUNITY)
Admission: RE | Admit: 2018-09-13 | Discharge: 2018-09-13 | Disposition: A | Discharge status: Home / Self Care | Payer: Medicare Other | Source: Ambulatory Visit | Attending: Orthopedic Surgery | Admitting: Orthopedic Surgery

## 2018-09-13 DIAGNOSIS — Z9189 Other specified personal risk factors, not elsewhere classified: Secondary | ICD-10-CM

## 2018-09-13 DIAGNOSIS — H579 Unspecified disorder of eye and adnexa: Secondary | ICD-10-CM

## 2018-09-13 DIAGNOSIS — Z01812 Encounter for preprocedural laboratory examination: Secondary | ICD-10-CM | POA: Diagnosis not present

## 2018-09-13 HISTORY — DX: Dyspnea, unspecified: R06.00

## 2018-09-13 LAB — BASIC METABOLIC PANEL
Anion gap: 7 (ref 5–15)
BUN: 19 mg/dL (ref 8–23)
CO2: 28 mmol/L (ref 22–32)
Calcium: 9.2 mg/dL (ref 8.9–10.3)
Chloride: 98 mmol/L (ref 98–111)
Creatinine, Ser: 1.21 mg/dL — ABNORMAL HIGH (ref 0.44–1.00)
GFR calc Af Amer: 49 mL/min — ABNORMAL LOW (ref >60)
GFR calc non Af Amer: 42 mL/min — ABNORMAL LOW (ref >60)
Glucose, Bld: 102 mg/dL — ABNORMAL HIGH (ref 70–99)
Potassium: 4.4 mmol/L (ref 3.5–5.1)
Sodium: 133 mmol/L — ABNORMAL LOW (ref 135–145)

## 2018-09-13 LAB — SURGICAL PCR SCREEN
MRSA, PCR: POSITIVE — AB
Staphylococcus aureus: POSITIVE — AB

## 2018-09-13 LAB — CBC
HCT: 42.7 % (ref 36.0–46.0)
Hemoglobin: 13.4 g/dL (ref 12.0–15.0)
MCH: 29 pg (ref 26.0–34.0)
MCHC: 31.4 g/dL (ref 30.0–36.0)
MCV: 92.4 fL (ref 80.0–100.0)
Platelets: 260 10*3/uL (ref 150–400)
RBC: 4.62 MIL/uL (ref 3.87–5.11)
RDW: 12.6 % (ref 11.5–15.5)
WBC: 5.8 10*3/uL (ref 4.0–10.5)
nRBC: 0 % (ref 0.0–0.2)

## 2018-09-13 NOTE — Telephone Encounter (Signed)
Spoke with Pt, states this was discussed at last OV. Went over questions today again to make sure. Responses below:  SLEEP APNEA Do you Snore loudly? Yes, per daughter  Do you often feel Tired during day? Yes, always  Has anyone Observed you stop breathing? No one has observed this, but she does wake herself by "jerking" often History of high blood Pressure? Yes, currently being treated for HTN and on medication   Pt also wants a referral to an eye specialist/surgeon. States she has been evaluated years ago and was advised she had scar tissue from previous cataract surgery. Would like to stay local to [facility] if possible.

## 2018-09-13 NOTE — Telephone Encounter (Signed)
Need to know why patient is requesting this.  What kind of symptoms actually experiencing that lead her to believe she needs a sleep study? See questions below.   SLEEP APNEA Do you Snore loudly? Do you often feel Tired during day? Has anyone Observed you stop breathing?  History of high blood Pressure?

## 2018-09-13 NOTE — Telephone Encounter (Signed)
Pt called requesting an update on referral for sleep study. No order seen in pt's chart. Pt wants study to be completed at home. Pls advise, thanks.

## 2018-09-16 NOTE — Telephone Encounter (Signed)
Referrals are placed for sleep study and ophthalmology

## 2018-09-16 NOTE — Telephone Encounter (Signed)
Left VM with status update.  

## 2018-09-17 NOTE — Progress Notes (Signed)
Patient contacted and informed of surgery time change. Patient aware to report to admitting at 0800 for check in . Npo after MN, sips with meds okay

## 2018-09-17 NOTE — Progress Notes (Addendum)
Anesthesia Chart Review   Case:  [id number] Date/Time:  09/24/18 1013   Procedure:  UNICOMPARTMENTAL KNEE (Left )   Anesthesia type:  Choice   Pre-op diagnosis:  OA LEFT KNEE   Location:  [facility] [facility]   Surgeon:  Marchia Bond, MD      DISCUSSION: 84 yo never smoker with h/o PONV, HLD, asthma, hypothyroidism, follicular lymphoma (s/p chemo/radiation 6/19), HTN, meniere's disease, A-fib, left knee OA scheduled for above procedure 09/24/18 with Dr. Marchia Bond.   Pt recently seen by cardiologist, Dr. Erline Levine, on 09/04/18.  At this visit she complained of worsening dyspnea.  Echo scheduled.  This has not been completed at this time.  Message left with Dr. Erline Levine to clarify clearance and need for echo prior to proceeding with surgery.   Per note from Dr. Erline Levine 09/13/18 (on Care Everywhere), "Given results of her stress test, it is OK for her to go to surgery before her echo is done."  Pt can proceed with planned procedure barring acute status change.  VS: BP (!) 159/67 (BP Location: Left Arm)   Pulse 62   Temp 36.7 C (Oral)   Resp 18   Ht 5' 5.5" (1.664 m)   Wt 83.5 kg   SpO2 97%   BMI 30.15 kg/m   PROVIDERS: [name], DO is PCP   Haydee Monica, MD is Cardiologist  LABS: Labs reviewed: Acceptable for surgery. (all labs ordered are listed, but only abnormal results are displayed)  Labs Reviewed  SURGICAL PCR SCREEN - Abnormal; Notable for the following components:      Result Value   MRSA, PCR POSITIVE (*)    Staphylococcus aureus POSITIVE (*)    All other components within normal limits  BASIC METABOLIC PANEL - Abnormal; Notable for the following components:   Sodium 133 (*)    Glucose, Bld 102 (*)    Creatinine, Ser 1.21 (*)    GFR calc non Af Amer 42 (*)    GFR calc Af Amer 49 (*)    All other components within normal limits  CBC     IMAGES:   EKG: 06/04/2018  Rate 66 bpm Normal sinus rhythm  Right bundle branch block  T wave  abnormality, consider anterior ischemia  Similar to previous EKG findings in Care Everywhere  CV: Stress Test 01/28/18 Result Impression   1. MYOCARDIAL PERFUSION: No evidence of transmural scar or inducible ischemia. 2. LEFT VENTRICULAR EJECTION FRACTION: Normal. 3. REGIONAL WALL MOTION: Normal. Focal radiotracer uptake anterior and superior to the heart, likely corresponding with the known anterior mediastinal mass (as seen on comparison imaging).    Echo 02/12/2017 SUMMARY The left ventricular size is normal. There is normal left ventricular wall  thickness. LV ejection fraction = 50-55%.  Left ventricular systolic function is low normal.  Left ventricular filling pattern is prolonged relaxation. The right ventricle is normal in size and function. The aortic valve is not well visualized. There is moderate aortic  regurgitation. Focal thickening of the mitral leaflet with preserved opening. There is mild  mitral regurgitation. The aortic root is normal size. IVC size was normal. There is no comparison study available. - Past Medical History:  Diagnosis Date  . Arthritis   . Asthma   . Atrial fibrillation (HCC)    paroxysmal , addressed  with metoprolol   . Chronic sinusitis   . Complication of anesthesia   . Dyspnea    sob on exertion, reports at her pre-op  today 09-13-2018 that her cardiologist has planned for her to do a ECHO for evaluation   . Family history of adverse reaction to anesthesia    sister is hard to wake up from Anesthesia  . Follicular lymphoma ([place])    received Chemotherapy (2 years ago)  and radiation (last 12/2017)  . Hyperlipidemia   . Hypertension   . Hypothyroidism   . Meniere's disease   . PONV (postoperative nausea and vomiting)   . Primary localized osteoarthritis of right knee 06/18/2018  . Reflux     Past Surgical History:  Procedure Laterality Date  . ABDOMINAL HYSTERECTOMY    . BONE MARROW BIOPSY  06/16/2014   at [place]     bilateral  . CHOLECYSTECTOMY    . KNEE ARTHROSCOPY    . PARTIAL KNEE ARTHROPLASTY Right 06/18/2018   Procedure: UNICOMPARTMENTAL KNEE;  Surgeon: Marchia Bond, MD;  Location: [facility];  Service: Orthopedics;  Laterality: Right;    MEDICATIONS: . acyclovir ointment (ZOVIRAX) 5 %  . amLODipine (NORVASC) 5 MG tablet  . aspirin EC 325 MG tablet  . aspirin EC 81 MG tablet  . baclofen (LIORESAL) 10 MG tablet  . Calcium Citrate-Vitamin D (CALCIUM CITRATE+D3 PO)  . fluticasone (FLOVENT HFA) 110 MCG/ACT inhaler  . hydrochlorothiazide (HYDRODIURIL) 25 MG tablet  . HYDROcodone-acetaminophen (NORCO) 10-325 MG tablet  . isosorbide mononitrate (IMDUR) 30 MG 24 hr tablet  . levothyroxine (SYNTHROID, LEVOTHROID) 100 MCG tablet  . losartan (COZAAR) 100 MG tablet  . meloxicam (MOBIC) 15 MG tablet  . metoprolol tartrate (LOPRESSOR) 100 MG tablet  . omeprazole (PRILOSEC) 40 MG capsule  . ondansetron (ZOFRAN) 4 MG tablet  . pravastatin (PRAVACHOL) 80 MG tablet  . sennosides-docusate sodium (SENOKOT-S) 8.6-50 MG tablet  . tetrahydrozoline (VISINE) 0.05 % ophthalmic solution  . triamcinolone (NASACORT ALLERGY 24HR) 55 MCG/ACT AERO nasal inhaler   No current facility-administered medications for this encounter.     Maia Plan [facility] Pre-Surgical Testing [phone] 09/17/18 2:49 PM

## 2018-09-19 NOTE — Anesthesia Preprocedure Evaluation (Addendum)
Anesthesia Evaluation  Patient identified by MRN, date of birth, ID band Patient awake    Reviewed: Allergy & Precautions, NPO status , Patient's Chart, lab work & pertinent test results, reviewed documented beta blocker date and time   History of Anesthesia Complications (+) PONV  Airway Mallampati: II  TM Distance: >3 FB Neck ROM: Full    Dental  (+) Teeth Intact, Dental Advisory Given   Pulmonary asthma ,    Pulmonary exam normal breath sounds clear to auscultation       Cardiovascular hypertension, Pt. on medications and Pt. on home beta blockers Normal cardiovascular exam+ dysrhythmias Atrial Fibrillation  Rhythm:Regular Rate:Normal     Neuro/Psych negative neurological ROS     GI/Hepatic Neg liver ROS, GERD  Medicated and Controlled,  Endo/Other  Hypothyroidism   Renal/GU Renal InsufficiencyRenal diseaseCr 1.2     Musculoskeletal  (+) Arthritis ,   Abdominal   Peds  Hematology negative hematology ROS (+)   Anesthesia Other Findings Day of surgery medications reviewed with the patient.  Hx of follicular lymphoma s/p chemo/radiation last 12/2017  Reproductive/Obstetrics                           Anesthesia Physical Anesthesia Plan  ASA: III  Anesthesia Plan: Spinal   Post-op Pain Management:  Regional for Post-op pain   Induction:   PONV Risk Score and Plan: 4 or greater and Treatment may vary due to age or medical condition, Ondansetron and Propofol infusion  Airway Management Planned: Natural Airway and Simple Face Mask  Additional Equipment:   Intra-op Plan:   Post-operative Plan:   Informed Consent: I have reviewed the patients History and Physical, chart, labs and discussed the procedure including the risks, benefits and alternatives for the proposed anesthesia with the patient or authorized representative who has indicated his/her understanding and acceptance.      Dental advisory given  Plan Discussed with: CRNA  Anesthesia Plan Comments: (See PAT note 09/13/18, Konrad Felix, PA-C)      Anesthesia Quick Evaluation

## 2018-09-20 ENCOUNTER — Other Ambulatory Visit: Payer: Self-pay | Admitting: Orthopedic Surgery

## 2018-09-20 DIAGNOSIS — I340 Nonrheumatic mitral (valve) insufficiency: Secondary | ICD-10-CM | POA: Diagnosis not present

## 2018-09-20 DIAGNOSIS — R0600 Dyspnea, unspecified: Secondary | ICD-10-CM | POA: Diagnosis not present

## 2018-09-20 DIAGNOSIS — I351 Nonrheumatic aortic (valve) insufficiency: Secondary | ICD-10-CM | POA: Diagnosis not present

## 2018-09-20 DIAGNOSIS — I350 Nonrheumatic aortic (valve) stenosis: Secondary | ICD-10-CM | POA: Diagnosis not present

## 2018-09-20 NOTE — Care Plan (Signed)
Spoke with patient prior to surgery. She will discharge to home with daughter to assist. She has all needed equipment from prior surgery. She will go to OPPT at the [facility] in [place]. She is aware of plan and agreeable.  Choice offered.    Ladell Heads, [place]

## 2018-09-23 ENCOUNTER — Encounter (HOSPITAL_COMMUNITY): Payer: Self-pay | Admitting: *Deleted

## 2018-09-24 ENCOUNTER — Observation Stay (HOSPITAL_COMMUNITY)
Admission: RE | Admit: 2018-09-24 | Discharge: 2018-09-25 | Disposition: A | Discharge status: Home / Self Care | Payer: Medicare Other | Attending: Orthopedic Surgery | Admitting: Orthopedic Surgery

## 2018-09-24 ENCOUNTER — Encounter (HOSPITAL_COMMUNITY)
Admission: RE | Disposition: A | Discharge status: Home / Self Care | Payer: Self-pay | Source: Home / Self Care | Attending: Orthopedic Surgery

## 2018-09-24 ENCOUNTER — Encounter (HOSPITAL_COMMUNITY): Payer: Self-pay | Admitting: *Deleted

## 2018-09-24 ENCOUNTER — Ambulatory Visit (HOSPITAL_COMMUNITY): Payer: Medicare Other | Admitting: Anesthesiology

## 2018-09-24 ENCOUNTER — Ambulatory Visit (HOSPITAL_COMMUNITY): Payer: Medicare Other | Admitting: Physician Assistant

## 2018-09-24 ENCOUNTER — Observation Stay (HOSPITAL_COMMUNITY): Payer: Medicare Other

## 2018-09-24 ENCOUNTER — Other Ambulatory Visit: Payer: Self-pay

## 2018-09-24 DIAGNOSIS — H8109 Meniere's disease, unspecified ear: Secondary | ICD-10-CM | POA: Insufficient documentation

## 2018-09-24 DIAGNOSIS — Z96652 Presence of left artificial knee joint: Secondary | ICD-10-CM | POA: Diagnosis not present

## 2018-09-24 DIAGNOSIS — G8918 Other acute postprocedural pain: Secondary | ICD-10-CM | POA: Diagnosis not present

## 2018-09-24 DIAGNOSIS — I110 Hypertensive heart disease with heart failure: Secondary | ICD-10-CM | POA: Diagnosis not present

## 2018-09-24 DIAGNOSIS — Z791 Long term (current) use of non-steroidal anti-inflammatories (NSAID): Secondary | ICD-10-CM | POA: Insufficient documentation

## 2018-09-24 DIAGNOSIS — Z7951 Long term (current) use of inhaled steroids: Secondary | ICD-10-CM | POA: Insufficient documentation

## 2018-09-24 DIAGNOSIS — E039 Hypothyroidism, unspecified: Secondary | ICD-10-CM | POA: Insufficient documentation

## 2018-09-24 DIAGNOSIS — E785 Hyperlipidemia, unspecified: Secondary | ICD-10-CM | POA: Insufficient documentation

## 2018-09-24 DIAGNOSIS — Z79890 Hormone replacement therapy (postmenopausal): Secondary | ICD-10-CM | POA: Insufficient documentation

## 2018-09-24 DIAGNOSIS — K219 Gastro-esophageal reflux disease without esophagitis: Secondary | ICD-10-CM | POA: Diagnosis not present

## 2018-09-24 DIAGNOSIS — Z7982 Long term (current) use of aspirin: Secondary | ICD-10-CM | POA: Insufficient documentation

## 2018-09-24 DIAGNOSIS — I480 Paroxysmal atrial fibrillation: Secondary | ICD-10-CM | POA: Insufficient documentation

## 2018-09-24 DIAGNOSIS — M1712 Unilateral primary osteoarthritis, left knee: Principal | ICD-10-CM | POA: Diagnosis present

## 2018-09-24 DIAGNOSIS — Z79899 Other long term (current) drug therapy: Secondary | ICD-10-CM | POA: Diagnosis not present

## 2018-09-24 DIAGNOSIS — Z471 Aftercare following joint replacement surgery: Secondary | ICD-10-CM | POA: Diagnosis not present

## 2018-09-24 DIAGNOSIS — Z96651 Presence of right artificial knee joint: Secondary | ICD-10-CM

## 2018-09-24 HISTORY — DX: Unilateral primary osteoarthritis, left knee: M17.12

## 2018-09-24 HISTORY — PX: PARTIAL KNEE ARTHROPLASTY: SHX2174

## 2018-09-24 SURGERY — ARTHROPLASTY, KNEE, UNICOMPARTMENTAL
Anesthesia: Spinal | Site: Knee | Laterality: Left

## 2018-09-24 MED ORDER — OXYCODONE HCL 5 MG/5ML PO SOLN: 5.0000 mg | Freq: Once | ORAL | Status: DC | PRN

## 2018-09-24 MED ORDER — MAGNESIUM CITRATE PO SOLN: 1.0000 | Freq: Once | ORAL | Status: DC | PRN

## 2018-09-24 MED ORDER — METOCLOPRAMIDE HCL 5 MG PO TABS: 5.0000 mg | ORAL_TABLET | Freq: Three times a day (TID) | ORAL | Status: DC | PRN

## 2018-09-24 MED ORDER — TRIAMCINOLONE ACETONIDE 55 MCG/ACT NA AERO
2.0000 | INHALATION_SPRAY | Freq: Every day | NASAL | Status: DC | PRN
  Filled 2018-09-24: qty 21.6

## 2018-09-24 MED ORDER — KETOROLAC TROMETHAMINE 15 MG/ML IJ SOLN
7.5000 mg | Freq: Four times a day (QID) | INTRAMUSCULAR | Status: AC
  Administered 2018-09-24 – 2018-09-25 (×4): 7.5 mg via INTRAVENOUS
  Filled 2018-09-24 (×4): qty 1

## 2018-09-24 MED ORDER — OXYCODONE HCL 5 MG PO TABS: 5.0000 mg | ORAL_TABLET | Freq: Once | ORAL | Status: DC | PRN

## 2018-09-24 MED ORDER — SODIUM CHLORIDE 0.9 % IR SOLN
Status: DC | PRN
  Administered 2018-09-24: 1000 mL

## 2018-09-24 MED ORDER — ASPIRIN EC 325 MG PO TBEC: 325.0000 mg | DELAYED_RELEASE_TABLET | Freq: Two times a day (BID) | ORAL | 0 refills | Status: DC

## 2018-09-24 MED ORDER — BACLOFEN 10 MG PO TABS: 10.0000 mg | ORAL_TABLET | Freq: Three times a day (TID) | ORAL | Status: DC | PRN

## 2018-09-24 MED ORDER — BUPIVACAINE HCL 0.25 % IJ SOLN
INTRAMUSCULAR | Status: DC | PRN
  Administered 2018-09-24: 30 mL

## 2018-09-24 MED ORDER — TRANEXAMIC ACID-NACL 1000-0.7 MG/100ML-% IV SOLN
1000.0000 mg | INTRAVENOUS | Status: DC
  Filled 2018-09-24: qty 100

## 2018-09-24 MED ORDER — DOCUSATE SODIUM 100 MG PO CAPS
100.0000 mg | ORAL_CAPSULE | Freq: Two times a day (BID) | ORAL | Status: DC
  Administered 2018-09-24 – 2018-09-25 (×2): 100 mg via ORAL
  Filled 2018-09-24 (×2): qty 1

## 2018-09-24 MED ORDER — LOSARTAN POTASSIUM 50 MG PO TABS
100.0000 mg | ORAL_TABLET | Freq: Every day | ORAL | Status: DC
  Administered 2018-09-24: 100 mg via ORAL
  Filled 2018-09-24: qty 2

## 2018-09-24 MED ORDER — EPHEDRINE 5 MG/ML INJ
INTRAVENOUS | Status: AC
  Filled 2018-09-24: qty 10

## 2018-09-24 MED ORDER — PRAVASTATIN SODIUM 20 MG PO TABS
80.0000 mg | ORAL_TABLET | Freq: Every day | ORAL | Status: DC
  Administered 2018-09-24: 80 mg via ORAL
  Filled 2018-09-24: qty 4

## 2018-09-24 MED ORDER — PROMETHAZINE HCL 25 MG/ML IJ SOLN: 6.2500 mg | INTRAMUSCULAR | Status: DC | PRN

## 2018-09-24 MED ORDER — POLYETHYLENE GLYCOL 3350 17 G PO PACK
17.0000 g | PACK | Freq: Every day | ORAL | Status: DC | PRN
  Administered 2018-09-25: 17 g via ORAL
  Filled 2018-09-24: qty 1

## 2018-09-24 MED ORDER — DEXAMETHASONE SODIUM PHOSPHATE 10 MG/ML IJ SOLN
INTRAMUSCULAR | Status: AC
  Filled 2018-09-24: qty 1

## 2018-09-24 MED ORDER — BUPIVACAINE-EPINEPHRINE (PF) 0.25% -1:200000 IJ SOLN
INTRAMUSCULAR | Status: AC
  Filled 2018-09-24: qty 30

## 2018-09-24 MED ORDER — ALBUTEROL SULFATE (2.5 MG/3ML) 0.083% IN NEBU: 2.5000 mg | INHALATION_SOLUTION | Freq: Four times a day (QID) | RESPIRATORY_TRACT | Status: DC | PRN

## 2018-09-24 MED ORDER — ACYCLOVIR 5 % EX OINT
1.0000 "application " | TOPICAL_OINTMENT | Freq: Every day | CUTANEOUS | Status: DC | PRN
  Filled 2018-09-24: qty 15

## 2018-09-24 MED ORDER — ALUM & MAG HYDROXIDE-SIMETH 200-200-20 MG/5ML PO SUSP: 30.0000 mL | ORAL | Status: DC | PRN

## 2018-09-24 MED ORDER — HYDROCODONE-ACETAMINOPHEN 5-325 MG PO TABS
1.0000 | ORAL_TABLET | ORAL | Status: DC | PRN
  Administered 2018-09-24: 1 via ORAL
  Filled 2018-09-24: qty 1

## 2018-09-24 MED ORDER — PANTOPRAZOLE SODIUM 40 MG PO TBEC
40.0000 mg | DELAYED_RELEASE_TABLET | Freq: Every day | ORAL | Status: DC
  Administered 2018-09-25: 40 mg via ORAL
  Filled 2018-09-24: qty 1

## 2018-09-24 MED ORDER — MORPHINE SULFATE (PF) 2 MG/ML IV SOLN: 0.5000 mg | INTRAVENOUS | Status: DC | PRN

## 2018-09-24 MED ORDER — DEXAMETHASONE SODIUM PHOSPHATE 10 MG/ML IJ SOLN
INTRAMUSCULAR | Status: DC | PRN
  Administered 2018-09-24: 8 mg via INTRAVENOUS

## 2018-09-24 MED ORDER — ACETAMINOPHEN 10 MG/ML IV SOLN: 1000.0000 mg | Freq: Once | INTRAVENOUS | Status: DC | PRN

## 2018-09-24 MED ORDER — TRANEXAMIC ACID-NACL 1000-0.7 MG/100ML-% IV SOLN
1000.0000 mg | Freq: Once | INTRAVENOUS | Status: AC
  Administered 2018-09-24: 1000 mg via INTRAVENOUS
  Filled 2018-09-24: qty 100

## 2018-09-24 MED ORDER — LACTATED RINGERS IV SOLN
INTRAVENOUS | Status: DC
  Administered 2018-09-24: 11:00:00 via INTRAVENOUS

## 2018-09-24 MED ORDER — NAPHAZOLINE-PHENIRAMINE 0.025-0.3 % OP SOLN
2.0000 [drp] | Freq: Four times a day (QID) | OPHTHALMIC | Status: DC | PRN
  Filled 2018-09-24: qty 15

## 2018-09-24 MED ORDER — LEVOTHYROXINE SODIUM 100 MCG PO TABS
100.0000 ug | ORAL_TABLET | Freq: Every day | ORAL | Status: DC
  Administered 2018-09-25: 100 ug via ORAL
  Filled 2018-09-24: qty 1

## 2018-09-24 MED ORDER — PROPOFOL 500 MG/50ML IV EMUL
INTRAVENOUS | Status: DC | PRN
  Administered 2018-09-24: 40 ug/kg/min via INTRAVENOUS

## 2018-09-24 MED ORDER — METOPROLOL TARTRATE 50 MG PO TABS
100.0000 mg | ORAL_TABLET | Freq: Two times a day (BID) | ORAL | Status: DC
  Administered 2018-09-24 – 2018-09-25 (×2): 100 mg via ORAL
  Filled 2018-09-24 (×2): qty 2

## 2018-09-24 MED ORDER — KETOROLAC TROMETHAMINE 30 MG/ML IJ SOLN
INTRAMUSCULAR | Status: AC
  Filled 2018-09-24: qty 1

## 2018-09-24 MED ORDER — METOCLOPRAMIDE HCL 5 MG/ML IJ SOLN: 5.0000 mg | Freq: Three times a day (TID) | INTRAMUSCULAR | Status: DC | PRN

## 2018-09-24 MED ORDER — DIPHENHYDRAMINE HCL 12.5 MG/5ML PO ELIX: 12.5000 mg | ORAL_SOLUTION | ORAL | Status: DC | PRN

## 2018-09-24 MED ORDER — LACTATED RINGERS IV SOLN
INTRAVENOUS | Status: DC
  Administered 2018-09-24: 07:00:00 via INTRAVENOUS

## 2018-09-24 MED ORDER — BUPIVACAINE HCL (PF) 0.25 % IJ SOLN
INTRAMUSCULAR | Status: AC
  Filled 2018-09-24: qty 30

## 2018-09-24 MED ORDER — ONDANSETRON HCL 4 MG/2ML IJ SOLN
INTRAMUSCULAR | Status: DC | PRN
  Administered 2018-09-24: 4 mg via INTRAVENOUS

## 2018-09-24 MED ORDER — PROPOFOL 10 MG/ML IV BOLUS
INTRAVENOUS | Status: AC
  Filled 2018-09-24: qty 20

## 2018-09-24 MED ORDER — KETOROLAC TROMETHAMINE 30 MG/ML IJ SOLN
INTRAMUSCULAR | Status: DC | PRN
  Administered 2018-09-24: 30 mg via INTRA_ARTICULAR

## 2018-09-24 MED ORDER — PROPOFOL 10 MG/ML IV BOLUS
INTRAVENOUS | Status: AC
  Filled 2018-09-24: qty 40

## 2018-09-24 MED ORDER — 0.9 % SODIUM CHLORIDE (POUR BTL) OPTIME
TOPICAL | Status: DC | PRN
  Administered 2018-09-24: 1000 mL

## 2018-09-24 MED ORDER — ACETAMINOPHEN 500 MG PO TABS
500.0000 mg | ORAL_TABLET | Freq: Four times a day (QID) | ORAL | Status: AC
  Administered 2018-09-24 – 2018-09-25 (×4): 500 mg via ORAL
  Filled 2018-09-24 (×4): qty 1

## 2018-09-24 MED ORDER — ACETAMINOPHEN 325 MG PO TABS: 325.0000 mg | ORAL_TABLET | Freq: Four times a day (QID) | ORAL | Status: DC | PRN

## 2018-09-24 MED ORDER — BUDESONIDE 0.5 MG/2ML IN SUSP
0.5000 mg | Freq: Two times a day (BID) | RESPIRATORY_TRACT | Status: DC
  Filled 2018-09-24: qty 2

## 2018-09-24 MED ORDER — BACLOFEN 10 MG PO TABS: 10.0000 mg | ORAL_TABLET | Freq: Three times a day (TID) | ORAL | 0 refills | Status: DC | PRN

## 2018-09-24 MED ORDER — AMLODIPINE BESYLATE 5 MG PO TABS
5.0000 mg | ORAL_TABLET | Freq: Every evening | ORAL | Status: DC
  Administered 2018-09-24: 5 mg via ORAL
  Filled 2018-09-24: qty 1

## 2018-09-24 MED ORDER — ONDANSETRON HCL 4 MG PO TABS: 4.0000 mg | ORAL_TABLET | Freq: Three times a day (TID) | ORAL | Status: DC | PRN

## 2018-09-24 MED ORDER — HYDROCODONE-ACETAMINOPHEN 10-325 MG PO TABS: 1.0000 | ORAL_TABLET | Freq: Four times a day (QID) | ORAL | 0 refills | Status: DC | PRN

## 2018-09-24 MED ORDER — ZOLPIDEM TARTRATE 5 MG PO TABS: 5.0000 mg | ORAL_TABLET | Freq: Every evening | ORAL | Status: DC | PRN

## 2018-09-24 MED ORDER — MIDAZOLAM HCL 5 MG/5ML IJ SOLN
INTRAMUSCULAR | Status: DC | PRN
  Administered 2018-09-24: 1 mg via INTRAVENOUS

## 2018-09-24 MED ORDER — CEFAZOLIN SODIUM-DEXTROSE 2-4 GM/100ML-% IV SOLN
2.0000 g | INTRAVENOUS | Status: AC
  Administered 2018-09-24: 2 g via INTRAVENOUS
  Filled 2018-09-24: qty 100

## 2018-09-24 MED ORDER — CHLORHEXIDINE GLUCONATE 4 % EX LIQD: 60.0000 mL | Freq: Once | CUTANEOUS | Status: DC

## 2018-09-24 MED ORDER — FENTANYL CITRATE (PF) 100 MCG/2ML IJ SOLN
INTRAMUSCULAR | Status: DC | PRN
  Administered 2018-09-24: 25 ug via INTRAVENOUS
  Administered 2018-09-24: 50 ug via INTRAVENOUS

## 2018-09-24 MED ORDER — CALCIUM CARBONATE-VITAMIN D 500-200 MG-UNIT PO TABS
1.0000 | ORAL_TABLET | Freq: Every evening | ORAL | Status: DC
  Administered 2018-09-24: 1 via ORAL
  Filled 2018-09-24: qty 1

## 2018-09-24 MED ORDER — CEFAZOLIN SODIUM-DEXTROSE 2-4 GM/100ML-% IV SOLN
2.0000 g | Freq: Four times a day (QID) | INTRAVENOUS | Status: AC
  Administered 2018-09-24 (×2): 2 g via INTRAVENOUS
  Filled 2018-09-24 (×2): qty 100

## 2018-09-24 MED ORDER — PHENOL 1.4 % MT LIQD
1.0000 | OROMUCOSAL | Status: DC | PRN
  Filled 2018-09-24: qty 177

## 2018-09-24 MED ORDER — HYDROCODONE-ACETAMINOPHEN 7.5-325 MG PO TABS
1.0000 | ORAL_TABLET | ORAL | Status: DC | PRN
  Administered 2018-09-24 – 2018-09-25 (×2): 1 via ORAL
  Filled 2018-09-24 (×2): qty 1

## 2018-09-24 MED ORDER — POTASSIUM CHLORIDE IN NACL 20-0.45 MEQ/L-% IV SOLN
INTRAVENOUS | Status: DC
  Administered 2018-09-24 – 2018-09-25 (×2): via INTRAVENOUS
  Filled 2018-09-24 (×2): qty 1000

## 2018-09-24 MED ORDER — ISOSORBIDE MONONITRATE ER 30 MG PO TB24
30.0000 mg | ORAL_TABLET | Freq: Every day | ORAL | Status: DC
  Administered 2018-09-25: 30 mg via ORAL
  Filled 2018-09-24: qty 1

## 2018-09-24 MED ORDER — VANCOMYCIN HCL IN DEXTROSE 1-5 GM/200ML-% IV SOLN
1000.0000 mg | Freq: Once | INTRAVENOUS | Status: AC
  Administered 2018-09-24: 1000 mg via INTRAVENOUS
  Filled 2018-09-24: qty 200

## 2018-09-24 MED ORDER — BUPIVACAINE-EPINEPHRINE (PF) 0.5% -1:200000 IJ SOLN
INTRAMUSCULAR | Status: DC | PRN
  Administered 2018-09-24: 15 mL via PERINEURAL

## 2018-09-24 MED ORDER — BUPIVACAINE IN DEXTROSE 0.75-8.25 % IT SOLN
INTRATHECAL | Status: DC | PRN
  Administered 2018-09-24: 1.7 mL via INTRATHECAL

## 2018-09-24 MED ORDER — BISACODYL 10 MG RE SUPP: 10.0000 mg | Freq: Every day | RECTAL | Status: DC | PRN

## 2018-09-24 MED ORDER — ONDANSETRON HCL 4 MG/2ML IJ SOLN: 4.0000 mg | Freq: Four times a day (QID) | INTRAMUSCULAR | Status: DC | PRN

## 2018-09-24 MED ORDER — DEXAMETHASONE SODIUM PHOSPHATE 10 MG/ML IJ SOLN
10.0000 mg | Freq: Once | INTRAMUSCULAR | Status: AC
  Administered 2018-09-25: 10 mg via INTRAVENOUS
  Filled 2018-09-24: qty 1

## 2018-09-24 MED ORDER — MIDAZOLAM HCL 2 MG/2ML IJ SOLN
INTRAMUSCULAR | Status: AC
  Filled 2018-09-24: qty 2

## 2018-09-24 MED ORDER — LACTATED RINGERS IV SOLN
INTRAVENOUS | Status: DC | PRN
  Administered 2018-09-24: 07:00:00 via INTRAVENOUS

## 2018-09-24 MED ORDER — CALCIUM CITRATE-VITAMIN D 315-250 MG-UNIT PO TABS: ORAL_TABLET | Freq: Every evening | ORAL | Status: DC

## 2018-09-24 MED ORDER — FENTANYL CITRATE (PF) 100 MCG/2ML IJ SOLN: 25.0000 ug | INTRAMUSCULAR | Status: DC | PRN

## 2018-09-24 MED ORDER — FENTANYL CITRATE (PF) 100 MCG/2ML IJ SOLN
INTRAMUSCULAR | Status: AC
  Filled 2018-09-24: qty 2

## 2018-09-24 MED ORDER — HYDROCHLOROTHIAZIDE 25 MG PO TABS: 25.0000 mg | ORAL_TABLET | Freq: Every day | ORAL | Status: DC

## 2018-09-24 MED ORDER — ONDANSETRON HCL 4 MG PO TABS: 4.0000 mg | ORAL_TABLET | Freq: Four times a day (QID) | ORAL | Status: DC | PRN

## 2018-09-24 MED ORDER — MENTHOL 3 MG MT LOZG: 1.0000 | LOZENGE | OROMUCOSAL | Status: DC | PRN

## 2018-09-24 MED ORDER — ONDANSETRON HCL 4 MG/2ML IJ SOLN
INTRAMUSCULAR | Status: AC
  Filled 2018-09-24: qty 2

## 2018-09-24 MED ORDER — STERILE WATER FOR IRRIGATION IR SOLN
Status: DC | PRN
  Administered 2018-09-24: 2000 mL

## 2018-09-24 MED ORDER — EPHEDRINE SULFATE-NACL 50-0.9 MG/10ML-% IV SOSY
PREFILLED_SYRINGE | INTRAVENOUS | Status: DC | PRN
  Administered 2018-09-24 (×2): 5 mg via INTRAVENOUS

## 2018-09-24 MED ORDER — ASPIRIN EC 325 MG PO TBEC
325.0000 mg | DELAYED_RELEASE_TABLET | Freq: Two times a day (BID) | ORAL | Status: DC
  Administered 2018-09-24 – 2018-09-25 (×2): 325 mg via ORAL
  Filled 2018-09-24 (×2): qty 1

## 2018-09-24 SURGICAL SUPPLY — 62 items
BAG ZIPLOCK 12X15 (MISCELLANEOUS) ×2 IMPLANT
BANDAGE ESMARK 6X9 LF (GAUZE/BANDAGES/DRESSINGS) ×1 IMPLANT
BEARING MENISCAL TIBIAL 5 MD L (Orthopedic Implant) ×2 IMPLANT
BLADE SURG 15 STRL LF DISP TIS (BLADE) ×1 IMPLANT
BLADE SURG 15 STRL SS (BLADE) ×1
BNDG ELASTIC 6X15 VLCR STRL LF (GAUZE/BANDAGES/DRESSINGS) ×2 IMPLANT
BNDG ESMARK 6X9 LF (GAUZE/BANDAGES/DRESSINGS) ×2
BOWL SMART MIX CTS (DISPOSABLE) ×2 IMPLANT
CEMENT BONE R 1X40 (Cement) ×2 IMPLANT
CHLORAPREP W/TINT 26 (MISCELLANEOUS) ×4 IMPLANT
CLSR STERI-STRIP ANTIMIC 1/2X4 (GAUZE/BANDAGES/DRESSINGS) ×2 IMPLANT
COVER SURGICAL LIGHT HANDLE (MISCELLANEOUS) ×2 IMPLANT
COVER WAND RF STERILE (DRAPES) IMPLANT
CUFF TOURN SGL QUICK 34 (TOURNIQUET CUFF) ×1
CUFF TRNQT CYL 34X4.125X (TOURNIQUET CUFF) ×1 IMPLANT
DECANTER SPIKE VIAL GLASS SM (MISCELLANEOUS) IMPLANT
DRAPE POUCH INSTRU U-SHP 10X18 (DRAPES) ×2 IMPLANT
DRAPE SHEET LG 3/4 BI-LAMINATE (DRAPES) ×2 IMPLANT
DRAPE U-SHAPE 47X51 STRL (DRAPES) ×2 IMPLANT
DRSG MEPILEX BORDER 4X8 (GAUZE/BANDAGES/DRESSINGS) ×2 IMPLANT
DRSG PAD ABDOMINAL 8X10 ST (GAUZE/BANDAGES/DRESSINGS) ×2 IMPLANT
ELECT REM PT RETURN 15FT ADLT (MISCELLANEOUS) ×2 IMPLANT
FACESHIELD WRAPAROUND (MASK) ×2 IMPLANT
GLOVE BIO SURGEON STRL SZ7.5 (GLOVE) ×2 IMPLANT
GLOVE BIOGEL PI IND STRL 8 (GLOVE) ×2 IMPLANT
GLOVE BIOGEL PI INDICATOR 8 (GLOVE) ×2
GLOVE SURG ORTHO 8.0 STRL STRW (GLOVE) ×2 IMPLANT
GOWN STRL REUS W/TWL 2XL LVL3 (GOWN DISPOSABLE) ×2 IMPLANT
GOWN STRL REUS W/TWL LRG LVL3 (GOWN DISPOSABLE) ×2 IMPLANT
HANDPIECE INTERPULSE COAX TIP (DISPOSABLE) ×1
HOLDER FOLEY CATH W/STRAP (MISCELLANEOUS) IMPLANT
HOOD PEEL AWAY FLYTE STAYCOOL (MISCELLANEOUS) ×4 IMPLANT
IMMOBILIZER KNEE 20 (SOFTGOODS) ×2 IMPLANT
IMMOBILIZER KNEE 20 THIGH 36 (SOFTGOODS) IMPLANT
IMMOBILIZER KNEE 22 UNIV (SOFTGOODS) ×2 IMPLANT
INSERT TIBIAL OXFORD SZ B LF (Joint) ×2 IMPLANT
KIT BASIN OR (CUSTOM PROCEDURE TRAY) ×2 IMPLANT
KIT TURNOVER KIT A (KITS) IMPLANT
NDL SAFETY ECLIPSE 18X1.5 (NEEDLE) ×1 IMPLANT
NEEDLE HYPO 18GX1.5 SHARP (NEEDLE) ×1
NS IRRIG 1000ML POUR BTL (IV SOLUTION) ×2 IMPLANT
PACK BLADE SAW RECIP 70 3 PT (BLADE) ×2 IMPLANT
PACK ICE MAXI GEL EZY WRAP (MISCELLANEOUS) ×2 IMPLANT
PACK TOTAL JOINT (CUSTOM PROCEDURE TRAY) ×2 IMPLANT
PEG TWIN FEM CEMENTED MED (Knees) ×2 IMPLANT
PROTECTOR NERVE ULNAR (MISCELLANEOUS) ×2 IMPLANT
SET HNDPC FAN SPRY TIP SCT (DISPOSABLE) ×1 IMPLANT
STRIP CLOSURE SKIN 1/2X4 (GAUZE/BANDAGES/DRESSINGS) ×2 IMPLANT
SUCTION FRAZIER HANDLE 12FR (TUBING) ×1
SUCTION TUBE FRAZIER 12FR DISP (TUBING) ×1 IMPLANT
SUT VIC AB 0 CT1 36 (SUTURE) ×2 IMPLANT
SUT VIC AB 2-0 CT1 27 (SUTURE) ×1
SUT VIC AB 2-0 CT1 TAPERPNT 27 (SUTURE) ×1 IMPLANT
SUT VIC AB 3-0 SH 8-18 (SUTURE) ×2 IMPLANT
SYR 20CC LL (SYRINGE) ×2 IMPLANT
SYR 30ML LL (SYRINGE) ×2 IMPLANT
SYR 3ML LL SCALE MARK (SYRINGE) ×2 IMPLANT
TOWEL OR 17X26 10 PK STRL BLUE (TOWEL DISPOSABLE) ×2 IMPLANT
TOWEL OR NON WOVEN STRL DISP B (DISPOSABLE) ×2 IMPLANT
TRAY FOLEY MTR SLVR 16FR STAT (SET/KITS/TRAYS/PACK) ×2 IMPLANT
WATER STERILE IRR 1000ML POUR (IV SOLUTION) ×2 IMPLANT
WRAP KNEE MAXI GEL POST OP (GAUZE/BANDAGES/DRESSINGS) ×2 IMPLANT

## 2018-09-24 NOTE — H&P (Signed)
PREOPERATIVE H&P  Chief Complaint: Left knee pain  HPI: [name] is a 84 y.o. female who presents for preoperative history and physical with a diagnosis of left anteromedial knee osteoarthritis. Symptoms are rated as moderate to severe, and have been worsening.  This is significantly impairing activities of daily living.  She has elected for surgical management.   She has failed injections, activity modification, anti-inflammatories, and assistive devices.  Preoperative X-rays demonstrate end stage degenerative changes with osteophyte formation, loss of joint space, subchondral sclerosis.  She has had the other side already done and is very happy with the results and wants to proceed with contralateral partial knee replacement.  Past Medical History:  Diagnosis Date  . Arthritis   . Asthma   . Atrial fibrillation (HCC)    paroxysmal , addressed  with metoprolol   . Chronic sinusitis   . Complication of anesthesia   . Dyspnea    sob on exertion, reports at her pre-op today 09-13-2018 that her cardiologist has planned for her to do a ECHO for evaluation   . Family history of adverse reaction to anesthesia    sister is hard to wake up from Anesthesia  . Follicular lymphoma ([place])    received Chemotherapy (2 years ago)  and radiation (last 12/2017)  . Hyperlipidemia   . Hypertension   . Hypothyroidism   . Meniere's disease   . PONV (postoperative nausea and vomiting)   . Primary localized osteoarthritis of right knee 06/18/2018  . Reflux    Past Surgical History:  Procedure Laterality Date  . ABDOMINAL HYSTERECTOMY    . BONE MARROW BIOPSY  06/16/2014   at [place]     bilateral  . CHOLECYSTECTOMY    . KNEE ARTHROSCOPY    . PARTIAL KNEE ARTHROPLASTY Right 06/18/2018   Procedure: UNICOMPARTMENTAL KNEE;  Surgeon: Marchia Bond, MD;  Location: [facility];  Service: Orthopedics;  Laterality: Right;   Social History   Socioeconomic History  . Marital  status: Single    Spouse name: Not on file  . Number of children: Not on file  . Years of education: Not on file  . Highest education level: Not on file  Occupational History  . Not on file  Social Needs  . Financial resource strain: Not on file  . Food insecurity:    Worry: Not on file    Inability: Not on file  . Transportation needs:    Medical: Not on file    Non-medical: Not on file  Tobacco Use  . Smoking status: Never Smoker  . Smokeless tobacco: Never Used  Substance and Sexual Activity  . Alcohol use: No  . Drug use: No  . Sexual activity: Not Currently    Partners: Male  Lifestyle  . Physical activity:    Days per week: Not on file    Minutes per session: Not on file  . Stress: Not on file  Relationships  . Social connections:    Talks on phone: Not on file    Gets together: Not on file    Attends religious service: Not on file    Active member of club or organization: Not on file    Attends meetings of clubs or organizations: Not on file    Relationship status: Not on file  Other Topics Concern  . Not on file  Social History Narrative  . Not on file   Family History  Problem Relation Age of Onset  .  Heart disease Other   . Skin cancer Mother   . Stroke Mother   . Prostate cancer Father   . High blood pressure Father   . Heart attack Father    No Known Allergies Prior to Admission medications   Medication Sig Start Date End Date Taking? Authorizing Provider  amLODipine (NORVASC) 5 MG tablet Take 1 tablet (5 mg total) by mouth daily. Patient taking differently: Take 5 mg by mouth every evening.  09/03/18  Yes [name], DO  aspirin EC 81 MG tablet Take 81 mg by mouth daily.   Yes [provider]  Calcium Citrate-Vitamin D (CALCIUM CITRATE+D3 PO) Take 1 tablet by mouth every evening.   Yes [provider]  fluticasone (FLOVENT HFA) 110 MCG/ACT inhaler Inhale 2 puffs into the lungs 2 (two) times daily as needed (for shortness  of breath or wheezing). 09/03/18  Yes [name], DO  HYDROcodone-acetaminophen (NORCO) 10-325 MG tablet Take 1 tablet by mouth every 6 (six) hours as needed. Patient taking differently: Take 1 tablet by mouth at bedtime as needed (knee pain.).  06/18/18  Yes Marchia Bond, MD  isosorbide mononitrate (IMDUR) 30 MG 24 hr tablet Take 1 tablet (30 mg total) by mouth daily. 09/03/18  Yes [name], DO  levothyroxine (SYNTHROID, LEVOTHROID) 100 MCG tablet Take 1 tablet (100 mcg total) by mouth daily before breakfast. 09/03/18  Yes [name], DO  losartan (COZAAR) 100 MG tablet Take 1 tablet (100 mg total) by mouth at bedtime. 09/03/18  Yes [name], DO  meloxicam (MOBIC) 15 MG tablet Take 15 mg by mouth daily as needed for pain.   Yes [provider]  metoprolol tartrate (LOPRESSOR) 100 MG tablet Take 1 tablet (100 mg total) by mouth 2 (two) times daily. 09/03/18  Yes [name], DO  omeprazole (PRILOSEC) 40 MG capsule Take 1 capsule (40 mg total) by mouth daily. Patient taking differently: Take 40 mg by mouth See admin instructions. Take 1 capsule (40 mg) by mouth scheduled in the morning, may repeat dose in the evening as needed for acid reflux/indigestion. 09/03/18  Yes [name], DO  ondansetron (ZOFRAN) 4 MG tablet Take 1 tablet (4 mg total) by mouth every 8 (eight) hours as needed for nausea or vomiting. 09/03/18  Yes [name], DO  pravastatin (PRAVACHOL) 80 MG tablet Take 1 tablet (80 mg total) by mouth at bedtime. 09/03/18  Yes [name], DO  sennosides-docusate sodium (SENOKOT-S) 8.6-50 MG tablet Take 2 tablets by mouth daily. Patient taking differently: Take 2 tablets by mouth daily as needed (constipation.).  06/18/18  Yes Marchia Bond, MD  tetrahydrozoline (VISINE) 0.05 % ophthalmic solution Place 2 drops into both eyes daily as needed (for dry eyes).   Yes [provider]  triamcinolone (NASACORT ALLERGY 24HR)  55 MCG/ACT AERO nasal inhaler Place 2 sprays into the nose daily. Patient taking differently: Place 2 sprays into the nose daily as needed (allergies.).  09/03/18  Yes [name], DO  acyclovir ointment (ZOVIRAX) 5 % Apply 1 application topically daily as needed (for cold sores). 09/03/18   [name], DO  aspirin EC 325 MG tablet Take 1 tablet (325 mg total) by mouth 2 (two) times daily. 06/18/18   Marchia Bond, MD  baclofen (LIORESAL) 10 MG tablet Take 1 tablet (10 mg total) by mouth 3 (three) times daily. As needed for muscle spasm Patient taking differently: Take 10 mg by mouth 3 (three) times daily as needed (muscle spasms).  06/18/18  Marchia Bond, MD  hydrochlorothiazide (HYDRODIURIL) 25 MG tablet Take 1 tablet (25 mg total) by mouth daily. 09/03/18   [name], DO     Positive ROS: All other systems have been reviewed and were otherwise negative with the exception of those mentioned in the HPI and as above.  Physical Exam: General: Alert, no acute distress Cardiovascular: No pedal edema Respiratory: No cyanosis, no use of accessory musculature GI: No organomegaly, abdomen is soft and non-tender Skin: No lesions in the area of chief complaint Neurologic: Sensation intact distally Psychiatric: Patient is competent for consent with normal mood and affect Lymphatic: No axillary or cervical lymphadenopathy  MUSCULOSKELETAL: Left knee has varus alignment with positive crepitance pseudolaxity mild effusion and pain to palpation medially.  Assessment: Left anteromedial knee osteoarthritis   Plan: Plan for Procedure(s): UNICOMPARTMENTAL KNEE  The risks benefits and alternatives were discussed with the patient including but not limited to the risks of nonoperative treatment, versus surgical intervention including infection, bleeding, nerve injury,  blood clots, cardiopulmonary complications, morbidity, mortality, among others, and they were willing to proceed.     Patient's anticipated LOS is less than 2 midnights, meeting these requirements: - Younger than 51 - Lives within 1 hour of care - Has a competent adult at home to recover with post-op recover - NO history of  - Chronic pain requiring opiods  - Diabetes  - Coronary Artery Disease  - Heart failure  - Heart attack  - Stroke  - DVT/VTE  - Cardiac arrhythmia  - Respiratory Failure/COPD  - Renal failure  - Anemia  - Advanced Liver disease        Johnny Bridge, MD Cell [phone]   09/24/2018 7:17 AM

## 2018-09-24 NOTE — Transfer of Care (Signed)
Immediate Anesthesia Transfer of Care Note  Patient: [name]  Procedure(s) Performed: UNICOMPARTMENTAL KNEE (Left Knee)  Patient Location: PACU  Anesthesia Type:MAC and Spinal  Level of Consciousness: awake, alert , oriented and patient cooperative  Airway & Oxygen Therapy: Patient Spontanous Breathing and Patient connected to face mask oxygen  Post-op Assessment: Report given to RN and Post -op Vital signs reviewed and stable  Post vital signs: Reviewed and stable  Last Vitals:  Vitals Value Taken Time  BP 135/63 09/24/2018  9:50 AM  Temp    Pulse 71 09/24/2018  9:52 AM  Resp 19 09/24/2018  9:52 AM  SpO2 100 % 09/24/2018  9:52 AM  Vitals shown include unvalidated device data.  Last Pain:  Vitals:   09/24/18 0545  TempSrc: Oral  PainSc: 3       Patients Stated Pain Goal: (unable) (62/69/48 5462)  Complications: No apparent anesthesia complications

## 2018-09-24 NOTE — Anesthesia Postprocedure Evaluation (Addendum)
Anesthesia Post Note  Patient: [name]  Procedure(s) Performed: UNICOMPARTMENTAL KNEE (Left Knee)     Patient location during evaluation: PACU Anesthesia Type: Spinal Level of consciousness: awake and alert Pain management: pain level controlled Vital Signs Assessment: post-procedure vital signs reviewed and stable Respiratory status: spontaneous breathing, nonlabored ventilation and respiratory function stable Cardiovascular status: blood pressure returned to baseline and stable Postop Assessment: no apparent nausea or vomiting and spinal receding Anesthetic complications: no    Last Vitals:  Vitals:   09/24/18 1030 09/24/18 1057  BP: (!) 143/68 (!) 155/69  Pulse: (!) 59 60  Resp: 17 16  Temp:  (!) 36.4 C  SpO2: 98% 100%    Last Pain:  Vitals:   09/24/18 1100  TempSrc:   PainSc: 0-No pain                 Brennan Bailey

## 2018-09-24 NOTE — Anesthesia Procedure Notes (Signed)
Spinal  Start time: 09/24/2018 7:28 AM End time: 09/24/2018 7:32 AM Staffing Resident/CRNA: Victoriano Lain, CRNA Preanesthetic Checklist Completed: patient identified, site marked, surgical consent, pre-op evaluation, timeout performed, IV checked, risks and benefits discussed and monitors and equipment checked Spinal Block Patient position: sitting Prep: site prepped and draped and DuraPrep Patient monitoring: heart rate, continuous pulse ox and blood pressure Approach: midline Location: L3-4 Injection technique: single-shot Needle Needle type: Pencan  Needle gauge: 24 G Needle length: 10 cm Assessment Sensory level: T4 Additional Notes Pt placed in sitting position for spinal placement. Spinal kit expiration date checked and verified. + CSF, - heme. Pt tolerated well.

## 2018-09-24 NOTE — Plan of Care (Signed)
Plan of care 

## 2018-09-24 NOTE — Op Note (Signed)
09/24/2018  9:56 AM  PATIENT:  [name]    PRE-OPERATIVE DIAGNOSIS: Left knee anteromedial osteoarthritis  POST-OPERATIVE DIAGNOSIS:  Same  PROCEDURE: LEFT unicompartmental Knee Arthroplasty  SURGEON:  Johnny Bridge, MD  PHYSICIAN ASSISTANT: Joya Gaskins, OPA-C, present and scrubbed throughout the case, critical for completion in a timely fashion, and for retraction, instrumentation, and closure.  ANESTHESIA:   Spinal  ESTIMATED BLOOD LOSS: 100 mL  UNIQUE ASPECTS OF THE CASE: She had a fair amount of rimming osteophyte on the tibia, similar to the contralateral side.  I was concerned posteriorly because of the cut ended just shy of the posterior lateral fence cut, and I had a broken osteophyte off of the back of the rim.  The tibia however was contained within the metaphysis.  She also had a fair amount of osteophyte on the femur, which I removed with the osteotome.  The medium looked good on the contralateral side and so I went with a medium.  Interestingly, she was negative for MRSA before her previous operation, and was discharged home, but presented screening positive for MRSA this time around.  She was given nasal Betadine, mupirocin, and IV vancomycin preoperatively.  PREOPERATIVE INDICATIONS:  [name] is a  84 y.o. female with a diagnosis of OA LEFT KNEE who failed conservative measures and elected for surgical management.    The risks benefits and alternatives were discussed with the patient preoperatively including but not limited to the risks of infection, bleeding, nerve injury, cardiopulmonary complications, blood clots, the need for revision surgery, among others, and the patient was willing to proceed.  OPERATIVE IMPLANTS: Biomet Oxford mobile bearing medial compartment arthroplasty femur size medium, tibia size B, bearing size 5.  OPERATIVE FINDINGS: Endstage grade 4 medial compartment osteoarthritis with eburnation on both the femur and the tibia and  substantial hypertrophic osteophyte formation. No significant changes in the lateral or patellofemoral joint, although there was medial facet disease that was fairly advanced, which was unloaded with the intra-articular correction, the lateral facet was okay.  The ACL was intact.  OPERATIVE PROCEDURE: The patient was brought to the operating room placed in the supine position. General anesthesia was administered. IV antibiotics were given. The lower extremity was placed in the legholder and prepped and draped in usual sterile fashion.  Time out was performed.  The leg was elevated and exsanguinated and the tourniquet was inflated. Anteromedial incision was performed, and I took care to preserve the MCL. Parapatellar incision was carried out, and the osteophytes were excised, along with the medial meniscus and a small portion of the fat pad.  The extra medullary tibial cutting jig was applied, using the spoon and the 34mm G-Clamp and the 2 mm shim, and I took care to protect the anterior cruciate ligament insertion and the tibial spine. The medial collateral ligament was also protected, and I resected my proximal tibia, matching the anatomic slope.   The proximal tibial bony cut was removed in one piece, and I turned my attention to the femur.  The intramedullary femoral rod was placed using the drill, and then using the appropriate reference, I assembled the femoral jig, setting my posterior cutting block. I resected my posterior femur, used the 0 spigot for the anterior femur, and then measured my gap.   I then used the appropriate mill to match the extension gap to the flexion gap. The second milling was at a 1 and then again at 2.  The gaps were then measured  again with the appropriate feeler gauges. Once I had balanced flexion and extension gaps, I then completed the preparation of the femur.  I milled off the anterior aspect of the distal femur to prevent impingement. I also exposed the tibia,  and selected the above-named component, and then used the cutting jig to prepare the keel slot on the tibia. I also used the awl to curette out the bone to complete the preparation of the keel. The back wall was intact.  I then placed trial components, and it was found to have excellent motion, and appropriate balance.  I then cemented the components into place, cementing the tibia first, removing all excess cement, and then cementing the femur.  All loose cement was removed.  The real polyethylene insert was applied manually, and the knee was taken through functional range of motion, and found to have excellent stability and restoration of joint motion, with excellent balance.  The wounds were irrigated copiously, and the parapatellar tissue closed with Vicryl, followed by Vicryl for the subcutaneous tissue, with routine closure with Steri-Strips and sterile gauze.  The tourniquet was released, and the patient was awakened and extubated and returned to PACU in stable and satisfactory condition. There were no complications.

## 2018-09-24 NOTE — Anesthesia Procedure Notes (Addendum)
Anesthesia Regional Block: Adductor canal block   Pre-Anesthetic Checklist: ,, timeout performed, Correct Patient, Correct Site, Correct Laterality, Correct Procedure, Correct Position, site marked, Risks and benefits discussed, pre-op evaluation,  At surgeon's request and post-op pain management  Laterality: Left  Prep: Maximum Sterile Barrier Precautions used, chloraprep       Needles:  Injection technique: Single-shot  Needle Type: Echogenic Stimulator Needle     Needle Length: 9cm  Needle Gauge: 22     Additional Needles:   Procedures:,,,, ultrasound used (permanent image in chart),,,,  Narrative:  Start time: 09/24/2018 7:06 AM End time: 09/24/2018 7:08 AM Injection made incrementally with aspirations every 5 mL.  Performed by: Personally  Anesthesiologist: Brennan Bailey, MD  Additional Notes: Risks, benefits, and alternative discussed. Patient gave consent for procedure. Patient prepped and draped in sterile fashion. Sedation administered, patient remains easily responsive to voice. Relevant anatomy identified with ultrasound guidance. Local anesthetic given in 5cc increments with no signs or symptoms of intravascular injection. No pain or paraesthesias with injection. Patient monitored throughout procedure with signs of LAST or immediate complications. Tolerated well. Ultrasound image placed in chart.  Tawny Asal, MD

## 2018-09-24 NOTE — Evaluation (Signed)
Physical Therapy Evaluation Patient Details Name: [name] MRN: [id number] DOB: [birth year] Today's Date: 09/24/2018   History of Present Illness  L unilateral knee arthroplasty., S/P R UKR 12/19  Clinical Impression  The patient is progressing well. Plans Dc tomorrow if PT goals are met.    Follow Up Recommendations Follow surgeon's recommendation for DC plan and follow-up therapies;Outpatient PT    Equipment Recommendations  None recommended by PT    Recommendations for Other Services       Precautions / Restrictions Precautions Precautions: Knee;Fall Required Braces or Orthoses: Knee Immobilizer - Left      Mobility  Bed Mobility Overal bed mobility: Needs Assistance Bed Mobility: Supine to Sit     Supine to sit: Mod assist     General bed mobility comments: assist with the  left leg and trunk  Transfers Overall transfer level: Needs assistance Equipment used: Rolling walker (2 wheeled) Transfers: Sit to/from Stand Sit to Stand: Min assist         General transfer comment: cues for  hand placement and left leg  Ambulation/Gait Ambulation/Gait assistance: Min assist Gait Distance (Feet): 40 Feet Assistive device: Rolling walker (2 wheeled) Gait Pattern/deviations: Step-to pattern;Step-through pattern     General Gait Details: cues for sequence and posture  Stairs            Wheelchair Mobility    Modified Rankin (Stroke Patients Only)       Balance                                             Pertinent Vitals/Pain Pain Assessment: 0-10 Faces Pain Scale: Hurts even more Pain Location: left knee Pain Descriptors / Indicators: Aching;Discomfort;Grimacing Pain Intervention(s): Monitored during session;Patient requesting pain meds-RN notified;Limited activity within patient's tolerance;Ice applied    Home Living Family/patient expects to be discharged to:: Private residence Living Arrangements: Children Available  Help at Discharge: Family Type of Home: House Home Access: Level entry     Home Layout: One [place]: Environmental consultant - 2 wheels;Cane - single point      Prior Function Level of Independence: Independent               Hand Dominance        Extremity/Trunk Assessment   Upper Extremity Assessment Upper Extremity Assessment: Overall WFL for tasks assessed    Lower Extremity Assessment Lower Extremity Assessment: RLE deficits/detail;LLE deficits/detail RLE Deficits / Details: lacks full ROM and strength post UKR 3 months ago LLE Deficits / Details: requires assistance for SLR       Communication   Communication: No difficulties  Cognition Arousal/Alertness: Awake/alert Behavior During Therapy: WFL for tasks assessed/performed Overall Cognitive Status: Within Functional Limits for tasks assessed                                        General Comments      Exercises     Assessment/Plan    PT Assessment Patient needs continued PT services  PT Problem List Decreased strength;Decreased range of motion;Decreased activity tolerance;Decreased mobility;Decreased knowledge of precautions;Decreased safety awareness;Decreased knowledge of use of DME;Pain       PT Treatment Interventions DME instruction;Therapeutic exercise;Gait training;Functional mobility training;Therapeutic activities;Patient/family education    PT Goals (Current goals can be  found in the Care Plan section)  Acute Rehab PT Goals Patient Stated Goal: to go home PT Goal Formulation: With patient Time For Goal Achievement: 10/01/18 Potential to Achieve Goals: Good    Frequency 7X/week   Barriers to discharge        Co-evaluation               AM-PAC PT "6 Clicks" Mobility  Outcome Measure Help needed turning from your back to your side while in a flat bed without using bedrails?: A Lot Help needed moving from lying on your back to sitting on the side of a flat bed  without using bedrails?: A Lot Help needed moving to and from a bed to a chair (including a wheelchair)?: A Lot Help needed standing up from a chair using your arms (e.g., wheelchair or bedside chair)?: A Lot Help needed to walk in hospital room?: A Lot Help needed climbing 3-5 steps with a railing? : A Lot 6 Click Score: 12    End of Session Equipment Utilized During Treatment: Gait belt;Left knee immobilizer Activity Tolerance: Patient tolerated treatment well Patient left: in chair;with call bell/phone within reach Nurse Communication: Mobility status;Patient requests pain meds PT Visit Diagnosis: Unsteadiness on feet (R26.81);Pain    Time: 3543-0148 PT Time Calculation (min) (ACUTE ONLY): 48 min   Charges:   PT Evaluation $PT Eval Low Complexity: 1 Low PT Treatments $Gait Training: 8-22 mins $Self Care/Home Management: [facility] Pager [phone] Office [phone]   Claretha Cooper 09/24/2018, 4:51 PM

## 2018-09-24 NOTE — Discharge Instructions (Signed)

## 2018-09-25 ENCOUNTER — Encounter (HOSPITAL_COMMUNITY): Payer: Self-pay | Admitting: Orthopedic Surgery

## 2018-09-25 DIAGNOSIS — M1712 Unilateral primary osteoarthritis, left knee: Secondary | ICD-10-CM | POA: Diagnosis not present

## 2018-09-25 DIAGNOSIS — E785 Hyperlipidemia, unspecified: Secondary | ICD-10-CM | POA: Diagnosis not present

## 2018-09-25 DIAGNOSIS — I480 Paroxysmal atrial fibrillation: Secondary | ICD-10-CM | POA: Diagnosis not present

## 2018-09-25 DIAGNOSIS — E039 Hypothyroidism, unspecified: Secondary | ICD-10-CM | POA: Diagnosis not present

## 2018-09-25 DIAGNOSIS — H8109 Meniere's disease, unspecified ear: Secondary | ICD-10-CM | POA: Diagnosis not present

## 2018-09-25 DIAGNOSIS — K219 Gastro-esophageal reflux disease without esophagitis: Secondary | ICD-10-CM | POA: Diagnosis not present

## 2018-09-25 LAB — CBC
HCT: 36.6 % (ref 36.0–46.0)
Hemoglobin: 11.7 g/dL — ABNORMAL LOW (ref 12.0–15.0)
MCH: 29.1 pg (ref 26.0–34.0)
MCHC: 32 g/dL (ref 30.0–36.0)
MCV: 91 fL (ref 80.0–100.0)
Platelets: 189 10*3/uL (ref 150–400)
RBC: 4.02 MIL/uL (ref 3.87–5.11)
RDW: 12.3 % (ref 11.5–15.5)
WBC: 9.4 10*3/uL (ref 4.0–10.5)
nRBC: 0 % (ref 0.0–0.2)

## 2018-09-25 LAB — BASIC METABOLIC PANEL
Anion gap: 6 (ref 5–15)
BUN: 21 mg/dL (ref 8–23)
CO2: 27 mmol/L (ref 22–32)
Calcium: 9 mg/dL (ref 8.9–10.3)
Chloride: 102 mmol/L (ref 98–111)
Creatinine, Ser: 1.09 mg/dL — ABNORMAL HIGH (ref 0.44–1.00)
GFR calc Af Amer: 55 mL/min — ABNORMAL LOW (ref >60)
GFR calc non Af Amer: 48 mL/min — ABNORMAL LOW (ref >60)
Glucose, Bld: 118 mg/dL — ABNORMAL HIGH (ref 70–99)
Potassium: 3.8 mmol/L (ref 3.5–5.1)
Sodium: 135 mmol/L (ref 135–145)

## 2018-09-25 NOTE — Plan of Care (Signed)
  Problem: Clinical Measurements: Goal: Ability to maintain clinical measurements within normal limits will improve Outcome: Progressing Goal: Will remain free from infection Outcome: Progressing Goal: Respiratory complications will improve Outcome: Progressing Goal: Cardiovascular complication will be avoided Outcome: Progressing   Problem: Nutrition: Goal: Adequate nutrition will be maintained Outcome: Progressing   Problem: Activity: Goal: Ability to avoid complications of mobility impairment will improve Outcome: Progressing

## 2018-09-25 NOTE — Progress Notes (Signed)
Patient discharged to home w/ dtr. Given all belongings, instructions. Verbalized understanding of instructions. Escorted to pov via w/c. 

## 2018-09-25 NOTE — Discharge Summary (Signed)
Physician Discharge Summary  Patient ID: [name] MRN: [id number] DOB/AGE: 1935-04-06 84 y.o.  Admit date: 09/24/2018 Discharge date: 09/25/2018  Admission Diagnoses:  Primary localized osteoarthritis of left knee  Discharge Diagnoses:  Principal Problem:   Primary localized osteoarthritis of left knee Active Problems:   S/P left unicompartmental knee replacement   Past Medical History:  Diagnosis Date  . Arthritis   . Asthma   . Atrial fibrillation (HCC)    paroxysmal , addressed  with metoprolol   . Chronic sinusitis   . Complication of anesthesia   . Dyspnea    sob on exertion, reports at her pre-op today 09-13-2018 that her cardiologist has planned for her to do a ECHO for evaluation   . Family history of adverse reaction to anesthesia    sister is hard to wake up from Anesthesia  . Follicular lymphoma ([place])    received Chemotherapy (2 years ago)  and radiation (last 12/2017)  . Hyperlipidemia   . Hypertension   . Hypothyroidism   . Meniere's disease   . PONV (postoperative nausea and vomiting)   . Primary localized osteoarthritis of left knee 09/24/2018  . Primary localized osteoarthritis of right knee 06/18/2018  . Reflux     Surgeries: Procedure(s): UNICOMPARTMENTAL KNEE on 09/24/2018   Consultants (if any):   Discharged Condition: Improved  Hospital Course: [name] is an 84 y.o. female who was admitted 09/24/2018 with a diagnosis of Primary localized osteoarthritis of left knee and went to the operating room on 09/24/2018 and underwent the above named procedures.    She was given perioperative antibiotics:  Anti-infectives (From admission, onward)   Start     Dose/Rate Route Frequency Ordered Stop   09/24/18 1400  ceFAZolin (ANCEF) IVPB 2g/100 mL premix     2 g 200 mL/hr over 30 Minutes Intravenous Every 6 hours 09/24/18 1103 09/24/18 2023   09/24/18 0745  vancomycin (VANCOCIN) IVPB 1000 mg/200 mL premix     1,000 mg 200 mL/hr over 60 Minutes  Intravenous  Once 09/24/18 0744 09/24/18 0849   09/24/18 0630  ceFAZolin (ANCEF) IVPB 2g/100 mL premix     2 g 200 mL/hr over 30 Minutes Intravenous On call to O.R. 09/24/18 8469 09/24/18 0753    .  She was given sequential compression devices, early ambulation, and aspirin for DVT prophylaxis.  She benefited maximally from the hospital stay and there were no complications.    Recent vital signs:  Vitals:   09/25/18 0610 09/25/18 0610  BP: (!) 187/85 (!) 187/85  Pulse: 87 68  Resp: 18 18  Temp: 97.9 F (36.6 C) 97.9 F (36.6 C)  SpO2: 99% 100%    Recent laboratory studies:  Lab Results  Component Value Date   HGB 11.7 (L) 09/25/2018   HGB 13.4 09/13/2018   HGB 11.2 (L) 06/19/2018   Lab Results  Component Value Date   WBC 9.4 09/25/2018   PLT 189 09/25/2018   No results found for: INR Lab Results  Component Value Date   NA 135 09/25/2018   K 3.8 09/25/2018   CL 102 09/25/2018   CO2 27 09/25/2018   BUN 21 09/25/2018   CREATININE 1.09 (H) 09/25/2018   GLUCOSE 118 (H) 09/25/2018    Discharge Medications:   Allergies as of 09/25/2018   No Known Allergies     Medication List    TAKE these medications   acyclovir ointment 5 % Commonly known as:  ZOVIRAX Apply 1 application topically  daily as needed (for cold sores).   amLODipine 5 MG tablet Commonly known as:  NORVASC Take 1 tablet (5 mg total) by mouth daily. What changed:  when to take this   aspirin EC 325 MG tablet Take 1 tablet (325 mg total) by mouth 2 (two) times daily. What changed:  Another medication with the same name was removed. Continue taking this medication, and follow the directions you see here.   baclofen 10 MG tablet Commonly known as:  LIORESAL Take 1 tablet (10 mg total) by mouth 3 (three) times daily as needed (muscle spasms).   CALCIUM CITRATE+D3 PO Take 1 tablet by mouth every evening.   fluticasone 110 MCG/ACT inhaler Commonly known as:  FLOVENT HFA Inhale 2 puffs into the  lungs 2 (two) times daily as needed (for shortness of breath or wheezing).   hydrochlorothiazide 25 MG tablet Commonly known as:  HYDRODIURIL Take 1 tablet (25 mg total) by mouth daily.   HYDROcodone-acetaminophen 10-325 MG tablet Commonly known as:  Norco Take 1 tablet by mouth every 6 (six) hours as needed. What changed:    when to take this  reasons to take this   isosorbide mononitrate 30 MG 24 hr tablet Commonly known as:  IMDUR Take 1 tablet (30 mg total) by mouth daily.   levothyroxine 100 MCG tablet Commonly known as:  SYNTHROID, LEVOTHROID Take 1 tablet (100 mcg total) by mouth daily before breakfast.   losartan 100 MG tablet Commonly known as:  COZAAR Take 1 tablet (100 mg total) by mouth at bedtime.   meloxicam 15 MG tablet Commonly known as:  MOBIC Take 15 mg by mouth daily as needed for pain.   metoprolol tartrate 100 MG tablet Commonly known as:  LOPRESSOR Take 1 tablet (100 mg total) by mouth 2 (two) times daily.   omeprazole 40 MG capsule Commonly known as:  PRILOSEC Take 1 capsule (40 mg total) by mouth daily. What changed:    when to take this  additional instructions   ondansetron 4 MG tablet Commonly known as:  Zofran Take 1 tablet (4 mg total) by mouth every 8 (eight) hours as needed for nausea or vomiting.   pravastatin 80 MG tablet Commonly known as:  PRAVACHOL Take 1 tablet (80 mg total) by mouth at bedtime.   sennosides-docusate sodium 8.6-50 MG tablet Commonly known as:  SENOKOT-S Take 2 tablets by mouth daily. What changed:    when to take this  reasons to take this   triamcinolone 55 MCG/ACT Aero nasal inhaler Commonly known as:  Nasacort Allergy 24HR Place 2 sprays into the nose daily. What changed:    when to take this  reasons to take this   Visine 0.05 % ophthalmic solution Generic drug:  tetrahydrozoline Place 2 drops into both eyes daily as needed (for dry eyes).       Diagnostic Studies: Dg Knee Left  Port  Result Date: 09/24/2018 CLINICAL DATA:  84 year old female post left knee replacement. Initial encounter. EXAM: PORTABLE LEFT KNEE - 1-2 VIEW COMPARISON:  None. FINDINGS: Post left medial compartment hemiarthroplasty which appears in satisfactory position without complication noted. Moderate patellofemoral joint degenerative changes. Mild lateral tibiofemoral joint degenerative changes. Vascular calcifications suspected. IMPRESSION: 1. Post left medial compartment hemiarthroplasty which appears in satisfactory position without complication noted. 2. Moderate patellofemoral joint degenerative changes. 3. Mild lateral tibiofemoral joint degenerative changes. Electronically Signed   By: Genia Del M.D.   On: 09/24/2018 10:27    Disposition: Discharge disposition: 01-Home  or Self Care         Follow-up Information    Marchia Bond, MD. Schedule an appointment as soon as possible for a visit in 2 weeks.   Specialty:  Orthopedic Surgery Contact information: [address] [place] [address] [phone]            Signed: Johnny Bridge 09/25/2018, 8:25 AM

## 2018-09-25 NOTE — Progress Notes (Addendum)
Patient ID: [name], female   DOB: 05/08/1935, 84 y.o.   MRN: [id number]     Subjective:  Patient reports pain as mild.  Patient in bed and in no acute distress.  States that she is ready to go home  Objective:   VITALS:   Vitals:   09/24/18 2244 09/25/18 0220 09/25/18 0610 09/25/18 0610  BP: (!) 155/74 (!) 172/81 (!) 187/85 (!) 187/85  Pulse: 89 88 87 68  Resp: 20 16 18 18   Temp: 98 F (36.7 C) 97.8 F (36.6 C) 97.9 F (36.6 C) 97.9 F (36.6 C)  TempSrc: Oral Oral Oral Oral  SpO2: 99% 99% 99% 100%  Weight:      Height:        ABD soft Sensation intact distally Dorsiflexion/Plantar flexion intact Incision: dressing C/D/I and no drainage   Lab Results  Component Value Date   WBC 9.4 09/25/2018   HGB 11.7 (L) 09/25/2018   HCT 36.6 09/25/2018   MCV 91.0 09/25/2018   PLT 189 09/25/2018   BMET    Component Value Date/Time   NA 135 09/25/2018 0452   K 3.8 09/25/2018 0452   CL 102 09/25/2018 0452   CO2 27 09/25/2018 0452   GLUCOSE 118 (H) 09/25/2018 0452   BUN 21 09/25/2018 0452   CREATININE 1.09 (H) 09/25/2018 0452   CALCIUM 9.0 09/25/2018 0452   GFRNONAA 48 (L) 09/25/2018 0452   GFRAA 55 (L) 09/25/2018 0452     Assessment/Plan: 1 Day Post-Op   Principal Problem:   Primary localized osteoarthritis of left knee Active Problems:   S/P left unicompartmental knee replacement   Advance diet Up with therapy DC home  WBAT Dry dressing PRN Follow up with Dr Mardelle Matte as scheduled  Patient's anticipated LOS is less than 2 midnights, meeting these requirements: - Younger than 55 - Lives within 1 hour of care - Has a competent adult at home to recover with post-op recover - NO history of  - Chronic pain requiring opiods  - Diabetes  - Coronary Artery Disease  - Heart failure  - Heart attack  - Stroke  - DVT/VTE  - Cardiac arrhythmia  - Respiratory Failure/COPD  - Renal failure  - Anemia  - Advanced Liver disease        Lunette Stands  09/25/2018, 8:15 AM  Discussed and agree with above.   Marchia Bond, MD Cell [phone]

## 2018-09-25 NOTE — Care Plan (Signed)
° ° °  Ortho Bundle Case Management Note  Patient Details  Name: [name] MRN: [id number] Date of Birth: [birth year]    Spoke with patient prior to surgery. She will discharge to home with daughter to assist. She has all needed equipment from prior surgery. She will go to OPPT at the [facility] in [place]. She is aware of plan and agreeable.  Choice offered.                   DME Arranged:    DME Agency:     HH Arranged:    HH Agency:     Additional Comments: Please contact me with any questions of if this plan should need to change.    09/25/2018, 9:52 AM

## 2018-09-25 NOTE — Care Management Obs Status (Signed)
[facility] NOTIFICATION   Patient Details  Name: [name] MRN: [id number] Date of Birth: 1935-05-27   Medicare Observation Status Notification Given:  Yes    Leeroy Cha, RN 09/25/2018, 10:13 AM

## 2018-09-25 NOTE — TOC Transition Note (Signed)
Transition of Care [facility]) - CM/SW Discharge Note   Patient Details  Name: [name] MRN: [id number] Date of Birth: September 17, 1934  Transition of Care [facility]) CM/SW Contact:  Leeroy Cha, RN Phone Number: 09/25/2018, 10:34 AM   Clinical Narrative:    Home with dme and plan is fore OOPT   Final next level of care: Home/Self Care Barriers to Discharge: No Barriers Identified   Patient Goals and CMS Choice Patient states their goals for this hospitalization and ongoing recovery are:: to go home go to thersapy and get better      Discharge Placement  home with children                     Discharge Plan and Services Discharge Planning Services: CM Consult Post Acute Care Choice: Durable Medical Equipment          DME Arranged: 3-N-1, Walker rolling DME Agency: [employer]       Social Determinants of Health (SDOH) Interventions     Readmission Risk Interventions No flowsheet data found.

## 2018-09-25 NOTE — TOC Initial Note (Signed)
Transition of Care [facility]) - Initial/Assessment Note    Patient Details  Name: [name] MRN: [id number] Date of Birth: 20-May-1935  [facility]) CM/SW Contact:    Leeroy Cha, RN Phone Number: 09/25/2018, 10:34 AM  Clinical Narrative:                 Total left knee replacement  Expected Discharge Plan: Home/Self Care Barriers to Discharge: No Barriers Identified   Patient Goals and CMS Choice Patient states their goals for this hospitalization and ongoing recovery are:: to go home go to thersapy and get better      Expected Discharge Plan and Services Expected Discharge Plan: Home/Self Care Discharge Planning Services: CM Consult Post Acute Care Choice: Durable Medical Equipment Living arrangements for the past 2 months: Single Family Home Expected Discharge Date: 09/25/18               DME Arranged: 3-N-1, Walker rolling DME Agency: [employer]      Prior Living Arrangements/Services Living arrangements for the past 2 months: Single Family Home Lives with:: Adult Children Patient language and need for interpreter reviewed:: No Do you feel safe going back to the place where you live?: Yes      Need for Family Participation in Patient Care: Yes (Comment) Care giver support system in place?: Yes (comment)   Criminal Activity/Legal Involvement Pertinent to Current Situation/Hospitalization: No - Comment as needed  Activities of Daily Living Home Assistive Devices/Equipment: Eyeglasses ADL Screening (condition at time of admission) Patient's cognitive ability adequate to safely complete daily activities?: Yes Is the patient deaf or have difficulty hearing?: Yes Does the patient have difficulty seeing, even when wearing glasses/contacts?: No Does the patient have difficulty concentrating, remembering, or making decisions?: No Patient able to express need for assistance with ADLs?: Yes Does the patient have difficulty dressing or bathing?:  No Independently performs ADLs?: Yes (appropriate for developmental age) Does the patient have difficulty walking or climbing stairs?: No Weakness of Legs: Left Weakness of Arms/Hands: None  Permission Sought/Granted                  Emotional Assessment Appearance:: Appears stated age   Affect (typically observed): Accepting, Calm Orientation: : Oriented to Self, Oriented to Place, Oriented to  Time, Oriented to Situation Alcohol / Substance Use: Never Used Psych Involvement: No (comment)  Admission diagnosis:  OA LEFT KNEE Patient Active Problem List   Diagnosis Date Noted  . Primary localized osteoarthritis of left knee 09/24/2018  . S/P left unicompartmental knee replacement 09/24/2018  . Fatigue 09/03/2018  . Myalgia 09/03/2018  . Vertigo 09/03/2018  . Sebaceous cyst 09/03/2018  . Callus of foot 09/03/2018  . Primary localized osteoarthritis of right knee 06/18/2018  . Status post right partial knee replacement 06/18/2018  . Nasal polyposis 05/16/2018  . Class 1 obesity due to excess calories with serious comorbidity and body mass index (BMI) of 30.0 to 30.9 in adult 07/16/2017  . DDD (degenerative disc disease), cervical 07/16/2017  . GERD (gastroesophageal reflux disease) 07/16/2017  . Other hyperlipidemia 09/27/2016  . Paroxysmal atrial fibrillation ([place]) 09/27/2016  . Benign essential hypertension 09/15/2015  . Follicular lymphoma ([place]) 12/18/2014  . Hypothyroidism 01/14/2014  . Carpal tunnel syndrome on both sides 06/30/2013  . Dyspnea 12/25/2012   PCP:  [name], DO Pharmacy:   [facility] [place], [name] - [address] AT [facility] [address] [place]  [address] Phone: [phone] Fax: [phone]     Social Determinants of Health (SDOH) Interventions    Readmission Risk Interventions  No flowsheet data found.

## 2018-09-25 NOTE — Progress Notes (Signed)
Physical Therapy Treatment Patient Details Name: [name] MRN: [id number] DOB: 1935-05-06 Today's Date: 09/25/2018    History of Present Illness L unilateral knee arthroplasty., S/P R UKR 12/19    PT Comments    Patient is progressing well. Ready for DC.  Follow Up Recommendations  Follow surgeon's recommendation for DC plan and follow-up therapies;Outpatient PT     Equipment Recommendations  None recommended by PT    Recommendations for Other Services       Precautions / Restrictions Precautions Precautions: Knee;Fall Precaution Comments: did not require KI    Mobility  Bed Mobility               General bed mobility comments: oob  Transfers Overall transfer level: Needs assistance Equipment used: Rolling walker (2 wheeled) Transfers: Sit to/from Stand Sit to Stand: Supervision            Ambulation/Gait Ambulation/Gait assistance: Min guard Gait Distance (Feet): 150 Feet Assistive device: Rolling walker (2 wheeled) Gait Pattern/deviations: Step-through pattern;Step-to pattern     General Gait Details: cues for sequence and posture   Stairs             Wheelchair Mobility    Modified Rankin (Stroke Patients Only)       Balance                                            Cognition Arousal/Alertness: Awake/alert                                            Exercises Total Joint Exercises Ankle Circles/Pumps: AROM;Both;10 reps Quad Sets: AROM;Both;10 reps Towel Squeeze: AROM Short Arc Quad: AAROM;Left;10 reps Heel Slides: AAROM;Left;10 reps Hip ABduction/ADduction: AAROM;Left;10 reps Straight Leg Raises: AAROM;Left;10 reps Long Arc Quad: AAROM;Left;10 reps Knee Flexion: AAROM;Left;10 reps    General Comments        Pertinent Vitals/Pain Pain Score: 5  Pain Location: left knee Pain Descriptors / Indicators: Aching;Discomfort;Grimacing Pain Intervention(s): Monitored during  session;Premedicated before session;Ice applied    Home Living                      Prior Function            PT Goals (current goals can now be found in the care plan section) Progress towards PT goals: Progressing toward goals    Frequency    7X/week      PT Plan      Co-evaluation              AM-PAC PT "6 Clicks" Mobility   Outcome Measure  Help needed turning from your back to your side while in a flat bed without using bedrails?: A Little Help needed moving from lying on your back to sitting on the side of a flat bed without using bedrails?: A Little Help needed moving to and from a bed to a chair (including a wheelchair)?: A Little Help needed standing up from a chair using your arms (e.g., wheelchair or bedside chair)?: A Little Help needed to walk in hospital room?: A Little Help needed climbing 3-5 steps with a railing? : A Little 6 Click Score: 18    End of Session   Activity Tolerance: Patient tolerated  treatment well Patient left: in chair;with call bell/phone within reach Nurse Communication: Mobility status PT Visit Diagnosis: Unsteadiness on feet (R26.81);Pain Pain - Right/Left: Left Pain - part of body: Knee     Time: 2458-0998 PT Time Calculation (min) (ACUTE ONLY): 55 min  Charges:  $Gait Training: 23-37 mins $Therapeutic Exercise: 8-22 mins $Self Care/Home Management: [place] Pager [phone] Office [phone]    Claretha Cooper 09/25/2018, 1:59 PM

## 2018-09-30 ENCOUNTER — Ambulatory Visit (INDEPENDENT_AMBULATORY_CARE_PROVIDER_SITE_OTHER): Payer: Medicare Other | Admitting: Rehabilitative and Restorative Service Providers"

## 2018-09-30 ENCOUNTER — Encounter: Payer: Self-pay | Admitting: Rehabilitative and Restorative Service Providers"

## 2018-09-30 ENCOUNTER — Other Ambulatory Visit: Payer: Self-pay

## 2018-09-30 DIAGNOSIS — M6281 Muscle weakness (generalized): Secondary | ICD-10-CM | POA: Diagnosis not present

## 2018-09-30 DIAGNOSIS — M25562 Pain in left knee: Secondary | ICD-10-CM

## 2018-09-30 DIAGNOSIS — R2689 Other abnormalities of gait and mobility: Secondary | ICD-10-CM

## 2018-09-30 DIAGNOSIS — R29898 Other symptoms and signs involving the musculoskeletal system: Secondary | ICD-10-CM

## 2018-09-30 NOTE — Therapy (Signed)
[place] [address] [place], [name], [address] Phone: [phone]   Fax:  [phone]  Physical Therapy Evaluation  Patient Details  Name: [name] MRN: [id number] Date of Birth: May 09, 1935 Referring Provider (PT): Dr Marchia Bond    Encounter Date: 09/30/2018  PT End of Session - 09/30/18 0912    Visit Number  1    Number of Visits  24    Date for PT Re-Evaluation  12/23/18    PT Start Time  0809    PT Stop Time  0920    PT Time Calculation (min)  71 min    Activity Tolerance  Patient tolerated treatment well    Behavior During Therapy  [facility] for tasks assessed/performed       Past Medical History:  Diagnosis Date  . Arthritis   . Asthma   . Atrial fibrillation (HCC)    paroxysmal , addressed  with metoprolol   . Chronic sinusitis   . Complication of anesthesia   . Dyspnea    sob on exertion, reports at her pre-op today 09-13-2018 that her cardiologist has planned for her to do a ECHO for evaluation   . Family history of adverse reaction to anesthesia    sister is hard to wake up from Anesthesia  . Follicular lymphoma ([place])    received Chemotherapy (2 years ago)  and radiation (last 12/2017)  . Hyperlipidemia   . Hypertension   . Hypothyroidism   . Meniere's disease   . PONV (postoperative nausea and vomiting)   . Primary localized osteoarthritis of left knee 09/24/2018  . Primary localized osteoarthritis of right knee 06/18/2018  . Reflux     Past Surgical History:  Procedure Laterality Date  . ABDOMINAL HYSTERECTOMY    . BONE MARROW BIOPSY  06/16/2014   at [facility]     bilateral  . CHOLECYSTECTOMY    . KNEE ARTHROSCOPY    . PARTIAL KNEE ARTHROPLASTY Right 06/18/2018   Procedure: UNICOMPARTMENTAL KNEE;  Surgeon: Marchia Bond, MD;  Location: [facility];  Service: Orthopedics;  Laterality: Right;  . PARTIAL KNEE ARTHROPLASTY Left 09/24/2018   Procedure:  UNICOMPARTMENTAL KNEE;  Surgeon: Marchia Bond, MD;  Location: [facility];  Service: Orthopedics;  Laterality: Left;    There were no vitals filed for this visit.   Subjective Assessment - 09/30/18 0818    Subjective  Patient reports that she has had Lt knee pain for the past 1-2 years with increasing pain since Rt uni-knee replacement 06/18/18. She underwent Lt uni-knee replacement 09/24/2018. She is having a post op pain.     Pertinent History  Arthritis; Rt uni-knee replacement; eye surgery; sleep apnea; follicular lymphoma - remains under treatment; asthma    Patient Stated Goals  get the knee moving and walk normally     Pain Score  9     Pain Location  Knee    Pain Orientation  Left    Pain Descriptors / Indicators  Dull;Aching;Sharp   sharp pain at times    Pain Type  Surgical pain;Chronic pain    Pain Frequency  Constant         [facility] PT Assessment - 09/30/18 0001      Assessment   Medical Diagnosis  Lt uni-knee replacement     Referring Provider (PT)  Dr Marchia Bond     Onset Date/Surgical Date  09/24/18    Hand Dominance  Right  Next MD Visit  10/09/2018    Prior Therapy  here for Rt knee       Precautions   Precautions  None      Balance Screen   Has the patient fallen in the past 6 months  No    Has the patient had a decrease in activity level because of a fear of falling?   No    Is the patient reluctant to leave their home because of a fear of falling?   No      Home Film/video editor residence    Living Arrangements  Children    Available Help at Discharge  Friend(s)    Type of [place] to enter    Entrance Stairs-Number of Steps  [place]  One level      Prior Function   Level of Independence  Independent    Vocation  Retired    Facilities manager retired 2012     Leisure  household chores       Observation/Other Assessments   Focus on Therapeutic Outcomes (FOTO)   90%  limitation       Observation/Other Assessments-Edema    Edema  --   moderate edeam noted Lt knee      Sensation   Additional Comments  WFL's per pt report       AROM   Right Knee Extension  0    Right Knee Flexion  121    Left Knee Extension  -7    Left Knee Flexion  79   70 supine; 79 sitting      Strength   Right/Left Hip  --   hip strength assessed i supine and sidelying    Right Hip Flexion  5/5    Right Hip Extension  5/5    Right Hip ABduction  5/5    Left Hip Flexion  3+/5    Left Hip Extension  3+/5    Left Hip ABduction  3/5    Right/Left Knee  --   Lt knee not tested resistively    Right Knee Flexion  4+/5    Right Knee Extension  5/5      Flexibility   Hamstrings  Rt ~ 75 deg; Lt ~ 60 deg     Quadriceps  tight Lt > Rt     ITB  tight Lt       Ambulation/Gait   Ambulation/Gait  Yes    Ambulation/Gait Assistance  7: Independent    Ambulation Distance (Feet)  40 Feet    Assistive device  Rolling walker    Gait Pattern  Step-to pattern    Ambulation Surface  Level    Gait velocity  slowed     Gait Comments  limp on Lt LE during wt bearing Lt                 Objective measurements completed on examination: See above findings.      [place] Adult PT Treatment/Exercise - 09/30/18 0001      Self-Care   Self-Care  --   education re positioning; HEP     Knee/Hip Exercises: Stretches   Passive Hamstring Stretch  Left;2 reps;30 seconds   supine with strap    Quad Stretch Limitations  initiated knee flexion sitting to stretch quads     Gastroc Stretch  Left;2 reps;30 seconds   standing  at counter      Knee/Hip Exercises: [place];Left;5 reps    Knee/Hip Flexion  for HEP       Knee/Hip Exercises: Supine   Quad Sets  AROM;Strengthening;Left;5 reps   5 sec hold    Straight Leg Raises  AROM;Strengthening;Left;5 reps   pt assisting SLR with strap      Vasopneumatic   Number Minutes Vasopneumatic   15 minutes    Vasopnuematic  Location   Knee   Lt    Vasopneumatic Pressure  Low    Vasopneumatic Temperature   34 deg             PT Education - 09/30/18 0852    Education Details  HEP, POC    Person(s) Educated  Patient    Methods  Explanation;Demonstration;Verbal cues;Handout    Comprehension  Verbalized understanding;Returned demonstration       PT Short Term Goals - 09/30/18 0920      PT SHORT TERM GOAL #1   Title  Independent in initial HEP 11/11/2018    Time  6    Period  Weeks    Status  New      PT SHORT TERM GOAL #2   Title  Abulation with least assistive device with good gait pattern 11/11/2018    Time  6    Period  Weeks    Status  New        PT Long Term Goals - 09/30/18 0920      PT LONG TERM GOAL #1   Title  Increased AROM Rt knee 100 deg flexion to 0 deg exension 12/23/2018    Time  12    Period  Weeks    Status  New      PT LONG TERM GOAL #2   Title  Increased strength to 4+/5 to 5/5 Rt LE 12/23/2018    Time  12    Period  Weeks    Status  New      PT LONG TERM GOAL #3   Title  Ambulation for community distances without assistive device or SPC as indicated 12/23/2018    Time  12    Period  Weeks    Status  New      PT LONG TERM GOAL #4   Title  Independent in HEP 12/23/2018    Time  12    Status  New      PT LONG TERM GOAL #5   Title  Improve FOTO to </= 61% limitation 12/23/2018    Time  12    Period  Weeks    Status  New             Plan - 09/30/18 0913    Clinical Impression Statement  Patient presents s/p Lt uni-knee replacement 09/24/2018. She has persistent post op pain; limited ROM/mobility/strength; decreased weight bearing; abnormal gait pattern; limited functional activity level. Patient will benefit from PT to address problems identified.     Personal Factors and Comorbidities  Age;Comorbidity 1;Comorbidity 2;Comorbidity 3+    Comorbidities  Rt uni-knee replacement; lymphoma; HTN    Examination-Activity Limitations  Bathing;Bed Mobility;Locomotion  Level;Stand;Bend;Carry    Examination-Participation Restrictions  Community Activity    Rehab Potential  Good    PT Frequency  2x / week    PT Duration  8 weeks    PT Treatment/Interventions  Patient/family education;ADLs/Self Care Home Management;Cryotherapy;Moist Heat;Gait training;Therapeutic activities;Therapeutic exercise;Neuromuscular re-education;Dry needling;Manual techniques    PT  Next Visit Plan  review HEP; progres with stretching and strengthening exercises     Consulted and Agree with Plan of Care  Patient       Patient will benefit from skilled therapeutic intervention in order to improve the following deficits and impairments:  Postural dysfunction, Improper body mechanics, Pain, Increased fascial restricitons, Increased muscle spasms, Hypomobility, Decreased strength, Decreased range of motion, Decreased mobility, Decreased endurance  Visit Diagnosis: Acute pain of left knee - Plan: PT plan of care cert/re-cert  Other symptoms and signs involving the musculoskeletal system - Plan: PT plan of care cert/re-cert  Muscle weakness (generalized) - Plan: PT plan of care cert/re-cert  Other abnormalities of gait and mobility - Plan: PT plan of care cert/re-cert     Problem List Patient Active Problem List   Diagnosis Date Noted  . Primary localized osteoarthritis of left knee 09/24/2018  . S/P left unicompartmental knee replacement 09/24/2018  . Fatigue 09/03/2018  . Myalgia 09/03/2018  . Vertigo 09/03/2018  . Sebaceous cyst 09/03/2018  . Callus of foot 09/03/2018  . Primary localized osteoarthritis of right knee 06/18/2018  . Status post right partial knee replacement 06/18/2018  . Nasal polyposis 05/16/2018  . Class 1 obesity due to excess calories with serious comorbidity and body mass index (BMI) of 30.0 to 30.9 in adult 07/16/2017  . DDD (degenerative disc disease), cervical 07/16/2017  . GERD (gastroesophageal reflux disease) 07/16/2017  . Other hyperlipidemia  09/27/2016  . Paroxysmal atrial fibrillation ([place]) 09/27/2016  . Benign essential hypertension 09/15/2015  . Follicular lymphoma ([place]) 12/18/2014  . Hypothyroidism 01/14/2014  . Carpal tunnel syndrome on both sides 06/30/2013  . Dyspnea 12/25/2012    Pavneet Markwood Nilda Simmer PT, MPH  09/30/2018, 9:25 AM  [facility] [place], [name], [address] Phone: [phone]   Fax:  [phone]  Name: CAYLOR CERINO MRN: [id number] Date of Birth: 28-Oct-1934

## 2018-09-30 NOTE — Patient Instructions (Signed)
Access Code: [email or web address]  URL: [email or web address]  Date: 09/30/2018  Prepared by: Kerin Perna   Exercises  Seated Long Arc Quad - 10 reps - 1 sets - 5 seconds hold - 2x daily - 7x weekly  Seated Active Assistive Knee Flexion and Extension - 10 reps - 1 sets - 2x daily - 7x weekly  Seated March - 10 reps - 1 sets - 2x daily - 7x weekly  Long Sitting Quad Set - 10 reps - 1 sets - 5-10 seconds hold - 3x daily - 7x weekly  Gastroc Stretch on Wall - 2-3 reps - 1 sets - 20-30 seconds hold - 2x daily - 7x weekly  Supine Active Straight Leg Raise - 10 reps - 1 sets - 2x daily - 7x weekly  Hooklying Hamstring Stretch with Strap - 2-3 reps - 1 sets - 20-30 seconds hold - 2x daily - 7x weekly  Prone Quadriceps Stretch with Strap - 2-3 reps - 1 sets - 20-30 seonds hold - 1-2x daily - 7x weekly  Supine Heel Slide with Strap - 10 reps - 1 sets - 5-10 hold - 1x daily - 7x weekly

## 2018-10-03 ENCOUNTER — Encounter: Payer: Medicare Other | Admitting: Physical Therapy

## 2018-10-07 ENCOUNTER — Encounter: Payer: Self-pay | Admitting: Physical Therapy

## 2018-10-07 ENCOUNTER — Ambulatory Visit (INDEPENDENT_AMBULATORY_CARE_PROVIDER_SITE_OTHER): Payer: Medicare Other | Admitting: Physical Therapy

## 2018-10-07 ENCOUNTER — Other Ambulatory Visit: Payer: Self-pay

## 2018-10-07 DIAGNOSIS — M6281 Muscle weakness (generalized): Secondary | ICD-10-CM

## 2018-10-07 DIAGNOSIS — M25562 Pain in left knee: Secondary | ICD-10-CM | POA: Diagnosis not present

## 2018-10-07 DIAGNOSIS — R2689 Other abnormalities of gait and mobility: Secondary | ICD-10-CM

## 2018-10-07 DIAGNOSIS — R29898 Other symptoms and signs involving the musculoskeletal system: Secondary | ICD-10-CM | POA: Diagnosis not present

## 2018-10-07 NOTE — Therapy (Signed)
[place], [name], [address] Phone: [phone]   Fax:  [phone]  Physical Therapy Treatment  Patient Details  Name: [name] MRN: [id number] Date of Birth: [birth year] Referring Provider (PT): Dr Marchia Bond    Encounter Date: 10/07/2018  PT End of Session - 10/07/18 1112    Visit Number  2    Number of Visits  24    Date for PT Re-Evaluation  12/23/18    PT Start Time  1013    PT Stop Time  1119    PT Time Calculation (min)  66 min    Activity Tolerance  Patient tolerated treatment well    Behavior During Therapy  [facility] for tasks assessed/performed       Past Medical History:  Diagnosis Date  . Arthritis   . Asthma   . Atrial fibrillation (HCC)    paroxysmal , addressed  with metoprolol   . Chronic sinusitis   . Complication of anesthesia   . Dyspnea    sob on exertion, reports at her pre-op today 09-13-2018 that her cardiologist has planned for her to do a ECHO for evaluation   . Family history of adverse reaction to anesthesia    sister is hard to wake up from Anesthesia  . Follicular lymphoma ([place])    received Chemotherapy (2 years ago)  and radiation (last 12/2017)  . Hyperlipidemia   . Hypertension   . Hypothyroidism   . Meniere's disease   . PONV (postoperative nausea and vomiting)   . Primary localized osteoarthritis of left knee 09/24/2018  . Primary localized osteoarthritis of right knee 06/18/2018  . Reflux     Past Surgical History:  Procedure Laterality Date  . ABDOMINAL HYSTERECTOMY    . BONE MARROW BIOPSY  06/16/2014   at [facility]     bilateral  . CHOLECYSTECTOMY    . KNEE ARTHROSCOPY    . PARTIAL KNEE ARTHROPLASTY Right 06/18/2018   Procedure: UNICOMPARTMENTAL KNEE;  Surgeon: Marchia Bond, MD;  Location: [facility];  Service: Orthopedics;  Laterality: Right;  . PARTIAL KNEE ARTHROPLASTY Left 09/24/2018   Procedure:  UNICOMPARTMENTAL KNEE;  Surgeon: Marchia Bond, MD;  Location: [facility];  Service: Orthopedics;  Laterality: Left;    There were no vitals filed for this visit.  Subjective Assessment - 10/07/18 1014    Subjective  doing well; knee feels okay just c/o soreness.    Pertinent History  Arthritis; Rt uni-knee replacement; eye surgery; sleep apnea; follicular lymphoma - remains under treatment; asthma    Patient Stated Goals  get the knee moving and walk normally     Currently in Pain?  Yes    Pain Score  6     Pain Location  Knee    Pain Orientation  Left    Pain Descriptors / Indicators  Dull;Sore    Pain Type  Surgical pain;Chronic pain    Pain Onset  1 to 4 weeks ago    Pain Frequency  Constant    Aggravating Factors   bending, walking    Pain Relieving Factors  ice; medication         [facility] PT Assessment - 10/07/18 1037      Assessment   Medical Diagnosis  Lt uni-knee replacement     Referring Provider (PT)  Dr Marchia Bond     Onset Date/Surgical Date  09/24/18    Next MD Visit  10/09/2018  AROM   Left Knee Extension  0    Left Knee Flexion  101                   [facility] Adult PT Treatment/Exercise - 10/07/18 1020      Knee/Hip Exercises: Aerobic   Nustep  L5 x 8 min      Knee/Hip Exercises: Seated   Heel Slides  Left;10 reps   AA x 10 reps   Heel Slides Limitations  5 sec hold    Marching  Left;10 reps      Knee/Hip Exercises: Supine   Heel Slides  Left;AAROM;10 reps    Heel Slides Limitations  active followed by AA for ROM      Modalities   Modalities  Electrical Stimulation;Vasopneumatic      Electrical Stimulation   Electrical Stimulation Location  Lt knee    Electrical Stimulation Action  IFC    Electrical Stimulation Parameters  to tolerance    Electrical Stimulation Goals  Pain      Vasopneumatic   Number Minutes Vasopneumatic   15 minutes    Vasopnuematic Location   Knee    Vasopneumatic Pressure  Low    Vasopneumatic Temperature    34 deg      Manual Therapy   Manual Therapy  Soft tissue mobilization;Passive ROM    Soft tissue mobilization  Lt anterior tib    Passive ROM  Lt knee into flexion and extension               PT Short Term Goals - 09/30/18 0920      PT SHORT TERM GOAL #1   Title  Independent in initial HEP 11/11/2018    Time  6    Period  Weeks    Status  New      PT SHORT TERM GOAL #2   Title  Abulation with least assistive device with good gait pattern 11/11/2018    Time  6    Period  Weeks    Status  New        PT Long Term Goals - 09/30/18 0920      PT LONG TERM GOAL #1   Title  Increased AROM Rt knee 100 deg flexion to 0 deg exension 12/23/2018    Time  12    Period  Weeks    Status  New      PT LONG TERM GOAL #2   Title  Increased strength to 4+/5 to 5/5 Rt LE 12/23/2018    Time  12    Period  Weeks    Status  New      PT LONG TERM GOAL #3   Title  Ambulation for community distances without assistive device or SPC as indicated 12/23/2018    Time  12    Period  Weeks    Status  New      PT LONG TERM GOAL #4   Title  Independent in HEP 12/23/2018    Time  12    Status  New      PT LONG TERM GOAL #5   Title  Improve FOTO to </= 61% limitation 12/23/2018    Time  12    Period  Weeks    Status  New            Plan - 10/07/18 1113    Clinical Impression Statement  Pt reports some compliance with HEP, so session spent reviewing HEP as well as  manual therapy.  Pt with improvement in ROM in both flexion and extension.  Progressing well with PT.    Personal Factors and Comorbidities  Age;Comorbidity 1;Comorbidity 2;Comorbidity 3+    Comorbidities  Rt uni-knee replacement; lymphoma; HTN    Examination-Activity Limitations  Bathing;Bed Mobility;Locomotion Level;Stand;Bend;Carry    Examination-Participation Restrictions  Community Activity    Rehab Potential  Good    PT Frequency  2x / week    PT Duration  8 weeks    PT Treatment/Interventions  Patient/family  education;ADLs/Self Care Home Management;Cryotherapy;Moist Heat;Gait training;Therapeutic activities;Therapeutic exercise;Neuromuscular re-education;Dry needling;Manual techniques    PT Next Visit Plan  review HEP; progres with stretching and strengthening exercises     PT Home Exercise Plan  [email or web address]    Consulted and Agree with Plan of Care  Patient       Patient will benefit from skilled therapeutic intervention in order to improve the following deficits and impairments:  Postural dysfunction, Improper body mechanics, Pain, Increased fascial restricitons, Increased muscle spasms, Hypomobility, Decreased strength, Decreased range of motion, Decreased mobility, Decreased endurance  Visit Diagnosis: Acute pain of left knee  Other symptoms and signs involving the musculoskeletal system  Muscle weakness (generalized)  Other abnormalities of gait and mobility     Problem List Patient Active Problem List   Diagnosis Date Noted  . Primary localized osteoarthritis of left knee 09/24/2018  . S/P left unicompartmental knee replacement 09/24/2018  . Fatigue 09/03/2018  . Myalgia 09/03/2018  . Vertigo 09/03/2018  . Sebaceous cyst 09/03/2018  . Callus of foot 09/03/2018  . Primary localized osteoarthritis of right knee 06/18/2018  . Status post right partial knee replacement 06/18/2018  . Nasal polyposis 05/16/2018  . Class 1 obesity due to excess calories with serious comorbidity and body mass index (BMI) of 30.0 to 30.9 in adult 07/16/2017  . DDD (degenerative disc disease), cervical 07/16/2017  . GERD (gastroesophageal reflux disease) 07/16/2017  . Other hyperlipidemia 09/27/2016  . Paroxysmal atrial fibrillation ([place]) 09/27/2016  . Benign essential hypertension 09/15/2015  . Follicular lymphoma ([place]) 12/18/2014  . Hypothyroidism 01/14/2014  . Carpal tunnel syndrome on both sides 06/30/2013  . Dyspnea 12/25/2012      Laureen Abrahams, PT, DPT 10/07/18 11:15 AM    [facility] [place], [name], [address] Phone: [phone]   Fax:  [phone]  Name: [name] MRN: [id number] Date of Birth: 1935/01/07

## 2018-10-09 ENCOUNTER — Encounter: Payer: Self-pay | Admitting: Osteopathic Medicine

## 2018-10-09 ENCOUNTER — Telehealth: Payer: Self-pay

## 2018-10-09 ENCOUNTER — Other Ambulatory Visit: Payer: Self-pay

## 2018-10-09 DIAGNOSIS — E7212 Methylenetetrahydrofolate reductase deficiency: Secondary | ICD-10-CM | POA: Insufficient documentation

## 2018-10-09 DIAGNOSIS — Z1589 Genetic susceptibility to other disease: Secondary | ICD-10-CM | POA: Insufficient documentation

## 2018-10-09 DIAGNOSIS — M1712 Unilateral primary osteoarthritis, left knee: Secondary | ICD-10-CM | POA: Diagnosis not present

## 2018-10-09 MED ORDER — OMEPRAZOLE 40 MG PO CPDR: 40.0000 mg | DELAYED_RELEASE_CAPSULE | ORAL | 3 refills | Status: DC

## 2018-10-09 NOTE — Telephone Encounter (Signed)
Pt requesting med refill for meclinzine. Rx not listed in active med list. Pt wants rx sent to [facility] m/o pharmacy. Thanks.

## 2018-10-10 ENCOUNTER — Other Ambulatory Visit: Payer: Self-pay

## 2018-10-10 ENCOUNTER — Ambulatory Visit (INDEPENDENT_AMBULATORY_CARE_PROVIDER_SITE_OTHER): Payer: Medicare Other | Admitting: Physical Therapy

## 2018-10-10 ENCOUNTER — Encounter: Payer: Self-pay | Admitting: Physical Therapy

## 2018-10-10 DIAGNOSIS — M25562 Pain in left knee: Secondary | ICD-10-CM

## 2018-10-10 DIAGNOSIS — R2689 Other abnormalities of gait and mobility: Secondary | ICD-10-CM | POA: Diagnosis not present

## 2018-10-10 DIAGNOSIS — R29898 Other symptoms and signs involving the musculoskeletal system: Secondary | ICD-10-CM | POA: Diagnosis not present

## 2018-10-10 DIAGNOSIS — M6281 Muscle weakness (generalized): Secondary | ICD-10-CM | POA: Diagnosis not present

## 2018-10-10 MED ORDER — MECLIZINE HCL 25 MG PO TABS: 25.0000 mg | ORAL_TABLET | Freq: Three times a day (TID) | ORAL | 0 refills | Status: DC | PRN

## 2018-10-10 MED ORDER — MECLIZINE HCL 25 MG PO TABS: 25.0000 mg | ORAL_TABLET | Freq: Three times a day (TID) | ORAL | 0 refills | Status: AC | PRN

## 2018-10-10 NOTE — Therapy (Signed)
[place], [name], [address] Phone: [phone]   Fax:  [phone]  Physical Therapy Treatment  Patient Details  Name: [name] MRN: [id number] Date of Birth: [birth year] Referring Provider (PT): Dr Marchia Bond    Encounter Date: 10/10/2018  PT End of Session - 10/10/18 1320    Visit Number  3    Number of Visits  24    Date for PT Re-Evaluation  12/23/18    PT Start Time  0930    PT Stop Time  1032    PT Time Calculation (min)  62 min    Activity Tolerance  Patient tolerated treatment well    Behavior During Therapy  [facility] for tasks assessed/performed       Past Medical History:  Diagnosis Date  . Arthritis   . Asthma   . Atrial fibrillation (HCC)    paroxysmal , addressed  with metoprolol   . Chronic sinusitis   . Complication of anesthesia   . Dyspnea    sob on exertion, reports at her pre-op today 09-13-2018 that her cardiologist has planned for her to do a ECHO for evaluation   . Family history of adverse reaction to anesthesia    sister is hard to wake up from Anesthesia  . Follicular lymphoma ([place])    received Chemotherapy (2 years ago)  and radiation (last 12/2017)  . Hyperlipidemia   . Hypertension   . Hypothyroidism   . Meniere's disease   . PONV (postoperative nausea and vomiting)   . Primary localized osteoarthritis of left knee 09/24/2018  . Primary localized osteoarthritis of right knee 06/18/2018  . Reflux     Past Surgical History:  Procedure Laterality Date  . ABDOMINAL HYSTERECTOMY    . BONE MARROW BIOPSY  06/16/2014   at [place]     bilateral  . CHOLECYSTECTOMY    . KNEE ARTHROSCOPY    . PARTIAL KNEE ARTHROPLASTY Right 06/18/2018   Procedure: UNICOMPARTMENTAL KNEE;  Surgeon: Marchia Bond, MD;  Location: [facility];  Service: Orthopedics;  Laterality: Right;  . PARTIAL KNEE ARTHROPLASTY Left 09/24/2018   Procedure:  UNICOMPARTMENTAL KNEE;  Surgeon: Marchia Bond, MD;  Location: [facility];  Service: Orthopedics;  Laterality: Left;    There were no vitals filed for this visit.  Subjective Assessment - 10/10/18 0934    Subjective  went to MD yesterday; MD pleased with progress so far.     Pertinent History  Arthritis; Rt uni-knee replacement; eye surgery; sleep apnea; follicular lymphoma - remains under treatment; asthma    Patient Stated Goals  get the knee moving and walk normally     Currently in Pain?  Yes    Pain Score  6     Pain Location  Knee    Pain Orientation  Left    Pain Descriptors / Indicators  Dull;Sore    Pain Type  Surgical pain    Pain Onset  1 to 4 weeks ago    Pain Frequency  Constant    Aggravating Factors   bending, walking    Pain Relieving Factors  ice, medication         [facility] PT Assessment - 10/10/18 0935      Assessment   Medical Diagnosis  Lt uni-knee replacement     Referring Provider (PT)  Dr Marchia Bond     Onset Date/Surgical Date  09/24/18    Hand Dominance  Right    Next MD Visit  11/06/2018                   [facility] Adult PT Treatment/Exercise - 10/10/18 0935      Ambulation/Gait   Pre-Gait Activities  standing weight shifting    Gait Comments  amb 80' x 2 without device: cues for knee flexion and mechanics      Knee/Hip Exercises: Stretches   Other Knee/Hip Stretches  Lt ft on 2nd 4" step: knee flexion 10x10 sec; knee ext 10x10 sec      Knee/Hip Exercises: Aerobic   Nustep  L5 x 8 min      Knee/Hip Exercises: Standing   Knee Flexion  AROM;Left;20 reps    Knee Flexion Limitations  limited motion      Knee/Hip Exercises: Supine   Heel Slides  Left;AAROM;20 reps    Heel Slides Limitations  active followed by AA for ROM      Electrical Stimulation   Electrical Stimulation Location  Lt knee    Electrical Stimulation Action  IFC    Electrical Stimulation Parameters  to tolerance    Electrical Stimulation Goals  Pain       Vasopneumatic   Number Minutes Vasopneumatic   15 minutes    Vasopnuematic Location   Knee    Vasopneumatic Pressure  Low    Vasopneumatic Temperature   34 deg      Manual Therapy   Manual Therapy  Soft tissue mobilization;Passive ROM    Soft tissue mobilization  Lt anterior tib    Passive ROM  Lt knee into flexion and extension               PT Short Term Goals - 09/30/18 0920      PT SHORT TERM GOAL #1   Title  Independent in initial HEP 11/11/2018    Time  6    Period  Weeks    Status  New      PT SHORT TERM GOAL #2   Title  Abulation with least assistive device with good gait pattern 11/11/2018    Time  6    Period  Weeks    Status  New        PT Long Term Goals - 09/30/18 0920      PT LONG TERM GOAL #1   Title  Increased AROM Rt knee 100 deg flexion to 0 deg exension 12/23/2018    Time  12    Period  Weeks    Status  New      PT LONG TERM GOAL #2   Title  Increased strength to 4+/5 to 5/5 Rt LE 12/23/2018    Time  12    Period  Weeks    Status  New      PT LONG TERM GOAL #3   Title  Ambulation for community distances without assistive device or SPC as indicated 12/23/2018    Time  12    Period  Weeks    Status  New      PT LONG TERM GOAL #4   Title  Independent in HEP 12/23/2018    Time  12    Status  New      PT LONG TERM GOAL #5   Title  Improve FOTO to </= 61% limitation 12/23/2018    Time  12    Period  Weeks    Status  New  Plan - 10/10/18 1320    Clinical Impression Statement  Pt tolerated session well today with improved mechanics with amb without device.  Pt with minimal hip/knee flexion with ambulation which improved with cues and pre-gait activities.  Overall progressing well with PT.      Personal Factors and Comorbidities  Age;Comorbidity 1;Comorbidity 2;Comorbidity 3+    Comorbidities  Rt uni-knee replacement; lymphoma; HTN    Examination-Activity Limitations  Bathing;Bed Mobility;Locomotion Level;Stand;Bend;Carry     Examination-Participation Restrictions  Community Activity    Rehab Potential  Good    PT Frequency  2x / week    PT Duration  8 weeks    PT Treatment/Interventions  Patient/family education;ADLs/Self Care Home Management;Cryotherapy;Moist Heat;Gait training;Therapeutic activities;Therapeutic exercise;Neuromuscular re-education;Dry needling;Manual techniques    PT Next Visit Plan  review HEP; progres with stretching and strengthening exercises     PT Home Exercise Plan  [email or web address]    Consulted and Agree with Plan of Care  Patient       Patient will benefit from skilled therapeutic intervention in order to improve the following deficits and impairments:  Postural dysfunction, Improper body mechanics, Pain, Increased fascial restricitons, Increased muscle spasms, Hypomobility, Decreased strength, Decreased range of motion, Decreased mobility, Decreased endurance  Visit Diagnosis: Acute pain of left knee  Other symptoms and signs involving the musculoskeletal system  Muscle weakness (generalized)  Other abnormalities of gait and mobility     Problem List Patient Active Problem List   Diagnosis Date Noted  . MTHFR gene mutation ([place]) 10/09/2018  . Primary localized osteoarthritis of left knee 09/24/2018  . S/P left unicompartmental knee replacement 09/24/2018  . Fatigue 09/03/2018  . Myalgia 09/03/2018  . Vertigo 09/03/2018  . Sebaceous cyst 09/03/2018  . Callus of foot 09/03/2018  . Primary localized osteoarthritis of right knee 06/18/2018  . Status post right partial knee replacement 06/18/2018  . Nasal polyposis 05/16/2018  . Class 1 obesity due to excess calories with serious comorbidity and body mass index (BMI) of 30.0 to 30.9 in adult 07/16/2017  . DDD (degenerative disc disease), cervical 07/16/2017  . GERD (gastroesophageal reflux disease) 07/16/2017  . Other hyperlipidemia 09/27/2016  . Paroxysmal atrial fibrillation ([place]) 09/27/2016  . Benign essential hypertension  09/15/2015  . Follicular lymphoma ([place]) 12/18/2014  . Hypothyroidism 01/14/2014  . Carpal tunnel syndrome on both sides 06/30/2013  . Dyspnea 12/25/2012      Laureen Abrahams, PT, DPT 10/10/18 1:22 PM     [facility] [address] [place], [name], [address] Phone: [phone]   Fax:  [phone]  Name: [name] MRN: [id number] Date of Birth: 1935/05/04

## 2018-10-10 NOTE — Telephone Encounter (Signed)
Pt requesting for med to be sent to [facility] m/o service. I've called [facility] pharmacy and cancelled the rx. Pls re-send. Thanks.

## 2018-10-10 NOTE — Telephone Encounter (Signed)
Sent to [facility] on file

## 2018-10-14 ENCOUNTER — Ambulatory Visit (INDEPENDENT_AMBULATORY_CARE_PROVIDER_SITE_OTHER): Payer: Medicare Other | Admitting: Rehabilitative and Restorative Service Providers"

## 2018-10-14 ENCOUNTER — Other Ambulatory Visit: Payer: Self-pay

## 2018-10-14 ENCOUNTER — Encounter: Payer: Self-pay | Admitting: Rehabilitative and Restorative Service Providers"

## 2018-10-14 DIAGNOSIS — E039 Hypothyroidism, unspecified: Secondary | ICD-10-CM | POA: Diagnosis not present

## 2018-10-14 DIAGNOSIS — M25562 Pain in left knee: Secondary | ICD-10-CM

## 2018-10-14 DIAGNOSIS — M6281 Muscle weakness (generalized): Secondary | ICD-10-CM | POA: Diagnosis not present

## 2018-10-14 DIAGNOSIS — R29898 Other symptoms and signs involving the musculoskeletal system: Secondary | ICD-10-CM | POA: Diagnosis not present

## 2018-10-14 DIAGNOSIS — R2689 Other abnormalities of gait and mobility: Secondary | ICD-10-CM | POA: Diagnosis not present

## 2018-10-14 LAB — TSH: TSH: 5.07 mIU/L — ABNORMAL HIGH (ref 0.40–4.50)

## 2018-10-14 NOTE — Patient Instructions (Addendum)
Calf stretch, Sitting (Passive)    Sit with strap or towel around ball of foot. Gently pull toward body and in toward your big toe to feel stretch along the side of the calf.  Hold _15-20__ seconds.  Repeat _2-3__ times per session. Do _2-3__ sessions per dayas needed

## 2018-10-14 NOTE — Therapy (Signed)
[place], [name], [address] Phone: [phone]   Fax:  [phone]  Physical Therapy Treatment  Patient Details  Name: JENEVA SCHWEIZER MRN: [id number] Date of Birth: Oct 12, 1934 Referring Provider (PT): Dr Marchia Bond    Encounter Date: 10/14/2018  PT End of Session - 10/14/18 1017    Visit Number  4    Number of Visits  24    Date for PT Re-Evaluation  12/23/18    PT Start Time  1012    PT Stop Time  1116    PT Time Calculation (min)  64 min    Activity Tolerance  Patient tolerated treatment well       Past Medical History:  Diagnosis Date  . Arthritis   . Asthma   . Atrial fibrillation (HCC)    paroxysmal , addressed  with metoprolol   . Chronic sinusitis   . Complication of anesthesia   . Dyspnea    sob on exertion, reports at her pre-op today 09-13-2018 that her cardiologist has planned for her to do a ECHO for evaluation   . Family history of adverse reaction to anesthesia    sister is hard to wake up from Anesthesia  . Follicular lymphoma ([place])    received Chemotherapy (2 years ago)  and radiation (last 12/2017)  . Hyperlipidemia   . Hypertension   . Hypothyroidism   . Meniere's disease   . PONV (postoperative nausea and vomiting)   . Primary localized osteoarthritis of left knee 09/24/2018  . Primary localized osteoarthritis of right knee 06/18/2018  . Reflux     Past Surgical History:  Procedure Laterality Date  . ABDOMINAL HYSTERECTOMY    . BONE MARROW BIOPSY  06/16/2014   at [place]     bilateral  . CHOLECYSTECTOMY    . KNEE ARTHROSCOPY    . PARTIAL KNEE ARTHROPLASTY Right 06/18/2018   Procedure: UNICOMPARTMENTAL KNEE;  Surgeon: Marchia Bond, MD;  Location: [facility];  Service: Orthopedics;  Laterality: Right;  . PARTIAL KNEE ARTHROPLASTY Left 09/24/2018   Procedure: UNICOMPARTMENTAL KNEE;  Surgeon: Marchia Bond, MD;  Location: [facility];   Service: Orthopedics;  Laterality: Left;    There were no vitals filed for this visit.  Subjective Assessment - 10/14/18 1018    Subjective  Pt report that she had a bad day yesterday- took some mobic last night and that helped. Looking forward to that 6 week time.     Currently in Pain?  No/denies    Pain Score  0-No pain         [facility] PT Assessment - 10/14/18 0001      Assessment   Medical Diagnosis  Lt uni-knee replacement     Referring Provider (PT)  Dr Marchia Bond     Onset Date/Surgical Date  09/24/18    Hand Dominance  Right    Next MD Visit  11/06/2018      AROM   Right Knee Extension  0    Right Knee Flexion  121    Left Knee Extension  0    Left Knee Flexion  105                   [facility] Adult PT Treatment/Exercise - 10/14/18 0001      Knee/Hip Exercises: Stretches   Gastroc Stretch  Left;Right;3 reps;30 seconds   on slant board    Other Knee/Hip Stretches  Lt  foot on 14 inch step bending forward to work on knee flexion 20 sec hold x 5 reps       Knee/Hip Exercises: Aerobic   Nustep  L5 x 10 min      Knee/Hip Exercises: Standing   Knee Flexion  AROM;Left;Right   alternating tap to 14 inch step    Hip Abduction  AROM;Stengthening;10 reps;Left;Right;2 sets;Knee straight    Hip Extension  AROM;Stengthening;Left;Right;2 sets;10 reps;Knee straight    Forward Step Up  Left;2 sets;10 reps;Hand Hold: 2;Step Height: 4"      Knee/Hip Exercises: Seated   Heel Slides  AAROM;Left;10 reps   20 sec hold - scooting hips forward on surface      Knee/Hip Exercises: Supine   Quad Sets  AROM;Strengthening;Left;5 reps   10 sec hold    Heel Slides  AAROM;Left;5 reps   assist w/ stretch strap 10 sec hold      Vasopneumatic   Number Minutes Vasopneumatic   15 minutes    Vasopnuematic Location   Knee    Vasopneumatic Pressure  Low    Vasopneumatic Temperature   34 deg      Manual Therapy   Manual Therapy  Soft tissue mobilization;Passive ROM    Soft tissue  mobilization  IASTM and manual work Lt anterior lateral tib     Passive ROM  Lt knee into flexion and extension             PT Education - 10/14/18 1115    Education Details  HEP     Person(s) Educated  Patient    Methods  Explanation;Demonstration;Tactile cues;Verbal cues;Handout    Comprehension  Verbalized understanding;Returned demonstration;Verbal cues required;Tactile cues required       PT Short Term Goals - 09/30/18 0920      PT SHORT TERM GOAL #1   Title  Independent in initial HEP 11/11/2018    Time  6    Period  Weeks    Status  New      PT SHORT TERM GOAL #2   Title  Abulation with least assistive device with good gait pattern 11/11/2018    Time  6    Period  Weeks    Status  New        PT Long Term Goals - 09/30/18 0920      PT LONG TERM GOAL #1   Title  Increased AROM Rt knee 100 deg flexion to 0 deg exension 12/23/2018    Time  12    Period  Weeks    Status  New      PT LONG TERM GOAL #2   Title  Increased strength to 4+/5 to 5/5 Rt LE 12/23/2018    Time  12    Period  Weeks    Status  New      PT LONG TERM GOAL #3   Title  Ambulation for community distances without assistive device or SPC as indicated 12/23/2018    Time  12    Period  Weeks    Status  New      PT LONG TERM GOAL #4   Title  Independent in HEP 12/23/2018    Time  12    Status  New      PT LONG TERM GOAL #5   Title  Improve FOTO to </= 61% limitation 12/23/2018    Time  12    Period  Weeks    Status  New  Plan - 10/14/18 1017    Clinical Impression Statement  continued work on ROM; strength; function. Patient demonstrating improved gait pattern and is ambulaing in the gym without assistive device. Reports doing this some at home as well. Bettyann demonstrates increased ROM remains stiff in flexion but still has a post op dressing in place - she will remove the bandage later this week.    Personal Factors and Comorbidities  Age;Comorbidity 1;Comorbidity  2;Comorbidity 3+    Comorbidities  Rt uni-knee replacement; lymphoma; HTN    Examination-Activity Limitations  Bathing;Bed Mobility;Locomotion Level;Stand;Bend;Carry    Examination-Participation Restrictions  Community Activity    Rehab Potential  Good    PT Frequency  2x / week    PT Duration  8 weeks    PT Treatment/Interventions  Patient/family education;ADLs/Self Care Home Management;Cryotherapy;Moist Heat;Gait training;Therapeutic activities;Therapeutic exercise;Neuromuscular re-education;Dry needling;Manual techniques    PT Next Visit Plan  review HEP; progres with stretching and strengthening exercises     PT Home Exercise Plan  [email or web address]    Consulted and Agree with Plan of Care  Patient       Patient will benefit from skilled therapeutic intervention in order to improve the following deficits and impairments:  Postural dysfunction, Improper body mechanics, Pain, Increased fascial restricitons, Increased muscle spasms, Hypomobility, Decreased strength, Decreased range of motion, Decreased mobility, Decreased endurance  Visit Diagnosis: Acute pain of left knee  Other symptoms and signs involving the musculoskeletal system  Muscle weakness (generalized)  Other abnormalities of gait and mobility     Problem List Patient Active Problem List   Diagnosis Date Noted  . MTHFR gene mutation ([place]) 10/09/2018  . Primary localized osteoarthritis of left knee 09/24/2018  . S/P left unicompartmental knee replacement 09/24/2018  . Fatigue 09/03/2018  . Myalgia 09/03/2018  . Vertigo 09/03/2018  . Sebaceous cyst 09/03/2018  . Callus of foot 09/03/2018  . Primary localized osteoarthritis of right knee 06/18/2018  . Status post right partial knee replacement 06/18/2018  . Nasal polyposis 05/16/2018  . Class 1 obesity due to excess calories with serious comorbidity and body mass index (BMI) of 30.0 to 30.9 in adult 07/16/2017  . DDD (degenerative disc disease), cervical 07/16/2017  .  GERD (gastroesophageal reflux disease) 07/16/2017  . Other hyperlipidemia 09/27/2016  . Paroxysmal atrial fibrillation ([place]) 09/27/2016  . Benign essential hypertension 09/15/2015  . Follicular lymphoma ([place]) 12/18/2014  . Hypothyroidism 01/14/2014  . Carpal tunnel syndrome on both sides 06/30/2013  . Dyspnea 12/25/2012    Jakiah Bienaime Nilda Simmer PT, MPH  10/14/2018, 11:18 AM  [facility] [place], [name], [address] Phone: [phone]   Fax:  [phone]  Name: BECKEY POLKOWSKI MRN: [id number] Date of Birth: 12-01-1934

## 2018-10-15 ENCOUNTER — Other Ambulatory Visit: Payer: Self-pay | Admitting: Osteopathic Medicine

## 2018-10-15 DIAGNOSIS — E039 Hypothyroidism, unspecified: Secondary | ICD-10-CM

## 2018-10-15 MED ORDER — LEVOTHYROXINE SODIUM 112 MCG PO TABS: 112.0000 ug | ORAL_TABLET | Freq: Every day | ORAL | 0 refills | Status: DC

## 2018-10-17 ENCOUNTER — Encounter: Payer: Self-pay | Admitting: Rehabilitative and Restorative Service Providers"

## 2018-10-17 ENCOUNTER — Other Ambulatory Visit: Payer: Self-pay

## 2018-10-17 ENCOUNTER — Ambulatory Visit (INDEPENDENT_AMBULATORY_CARE_PROVIDER_SITE_OTHER): Payer: Medicare Other | Admitting: Rehabilitative and Restorative Service Providers"

## 2018-10-17 DIAGNOSIS — R2689 Other abnormalities of gait and mobility: Secondary | ICD-10-CM

## 2018-10-17 DIAGNOSIS — M25562 Pain in left knee: Secondary | ICD-10-CM

## 2018-10-17 DIAGNOSIS — R29898 Other symptoms and signs involving the musculoskeletal system: Secondary | ICD-10-CM | POA: Diagnosis not present

## 2018-10-17 DIAGNOSIS — M6281 Muscle weakness (generalized): Secondary | ICD-10-CM | POA: Diagnosis not present

## 2018-10-17 NOTE — Therapy (Signed)
[place], [name], [address] Phone: [phone]   Fax:  [phone]  Physical Therapy Treatment  Patient Details  Name: [name] MRN: [id number] Date of Birth: 1935/05/17 Referring Provider (PT): Dr Marchia Bond    Encounter Date: 10/17/2018  PT End of Session - 10/17/18 1220    Visit Number  5    Number of Visits  24    Date for PT Re-Evaluation  12/23/18    PT Start Time  1204    PT Stop Time  1300    PT Time Calculation (min)  56 min    Activity Tolerance  Patient tolerated treatment well       Past Medical History:  Diagnosis Date  . Arthritis   . Asthma   . Atrial fibrillation (HCC)    paroxysmal , addressed  with metoprolol   . Chronic sinusitis   . Complication of anesthesia   . Dyspnea    sob on exertion, reports at her pre-op today 09-13-2018 that her cardiologist has planned for her to do a ECHO for evaluation   . Family history of adverse reaction to anesthesia    sister is hard to wake up from Anesthesia  . Follicular lymphoma ([place])    received Chemotherapy (2 years ago)  and radiation (last 12/2017)  . Hyperlipidemia   . Hypertension   . Hypothyroidism   . Meniere's disease   . PONV (postoperative nausea and vomiting)   . Primary localized osteoarthritis of left knee 09/24/2018  . Primary localized osteoarthritis of right knee 06/18/2018  . Reflux     Past Surgical History:  Procedure Laterality Date  . ABDOMINAL HYSTERECTOMY    . BONE MARROW BIOPSY  06/16/2014   at [place]     bilateral  . CHOLECYSTECTOMY    . KNEE ARTHROSCOPY    . PARTIAL KNEE ARTHROPLASTY Right 06/18/2018   Procedure: UNICOMPARTMENTAL KNEE;  Surgeon: Marchia Bond, MD;  Location: [facility];  Service: Orthopedics;  Laterality: Right;  . PARTIAL KNEE ARTHROPLASTY Left 09/24/2018   Procedure: UNICOMPARTMENTAL KNEE;  Surgeon: Marchia Bond, MD;  Location: [facility];   Service: Orthopedics;  Laterality: Left;    There were no vitals filed for this visit.  Subjective Assessment - 10/17/18 1222    Subjective  Better day today and the past few days - still having difficulty sleeping. Pain along the outside of the lateral calf is better - no longer has that "deep hurt". Last treatment really helped.     Currently in Pain?  No/denies         [facility] PT Assessment - 10/17/18 0001      Assessment   Medical Diagnosis  Lt uni-knee replacement     Referring Provider (PT)  Dr Marchia Bond     Onset Date/Surgical Date  09/24/18    Hand Dominance  Right    Next MD Visit  11/06/2018    Prior Therapy  here for Rt knee       Precautions   Precautions  None      [place] residence    Living Arrangements  Children    Available Help at Discharge  Friend(s)    Type of [place] [facility] to enter    Entrance Stairs-Number of Steps  1    [place]  One level  Prior Function   Level of [place]  Independent    Vocation  Retired    Facilities manager retired 2012     Leisure  household chores       Observation/Other Assessments   Focus on Therapeutic Outcomes (FOTO)   90% limitation       Observation/Other Assessments-Edema    Edema  --   moderate edeam noted Lt knee      Sensation   Additional Comments  WFL's per pt report       AROM   Right Knee Extension  0    Right Knee Flexion  121    Left Knee Extension  0    Left Knee Flexion  113      Strength   Right Hip Flexion  5/5    Right Hip Extension  5/5    Right Hip ABduction  5/5    Left Hip Flexion  3+/5    Left Hip Extension  3+/5    Left Hip ABduction  3/5    Right Knee Flexion  4+/5    Right Knee Extension  5/5      Flexibility   Hamstrings  Rt ~ 75 deg; Lt ~ 60 deg     Quadriceps  tight Lt > Rt     ITB  tight Lt       Ambulation/Gait   Ambulation/Gait  Yes    Ambulation/Gait Assistance  7:  Independent    Ambulation Distance (Feet)  40 Feet    Assistive device  Rolling walker    Gait Pattern  Step-to pattern    Gait velocity  slowed     Gait Comments  amb 80' x 2 without device: cues for knee flexion and mechanics                   [facility] Adult PT Treatment/Exercise - 10/17/18 0001      Self-Care   Self-Care  --   instructed in sleeping positions      Knee/Hip Exercises: Stretches   Sports administrator  Left;5 reps;20 seconds   prone with strap    Knee: Self-Stretch to increase Flexion  Left;5 reps;10 seconds   supine with strap    Other Knee/Hip Stretches  Lt foot on 17 inch step bending forward to work on knee flexion 20 sec hold x 10 reps       Knee/Hip Exercises: Aerobic   Nustep  L5 x 10 min      Knee/Hip Exercises: Seated   Other Seated Knee/Hip Exercises  working on knee flexion seated on high low table using strap to assist with knee flexion 20 sec hold x 4       Knee/Hip Exercises: Supine   Heel Slides  AAROM;Left;5 reps   assist w/ stretch strap 10 sec hold      Vasopneumatic   Number Minutes Vasopneumatic   15 minutes    Vasopnuematic Location   Knee    Vasopneumatic Pressure  Low    Vasopneumatic Temperature   34 deg      Manual Therapy   Manual therapy comments  pt sitting     Soft tissue mobilization  STM through quad working through the distal quad in area of banded tightness     Passive ROM  Lt knee into flexion in supine and sitting                PT Short Term Goals - 09/30/18 5208  PT SHORT TERM GOAL #1   Title  Independent in initial HEP 11/11/2018    Time  6    Period  Weeks    Status  New      PT SHORT TERM GOAL #2   Title  Abulation with least assistive device with good gait pattern 11/11/2018    Time  6    Period  Weeks    Status  New        PT Long Term Goals - 09/30/18 0920      PT LONG TERM GOAL #1   Title  Increased AROM Rt knee 100 deg flexion to 0 deg exension 12/23/2018    Time  12    Period   Weeks    Status  New      PT LONG TERM GOAL #2   Title  Increased strength to 4+/5 to 5/5 Rt LE 12/23/2018    Time  12    Period  Weeks    Status  New      PT LONG TERM GOAL #3   Title  Ambulation for community distances without assistive device or SPC as indicated 12/23/2018    Time  12    Period  Weeks    Status  New      PT LONG TERM GOAL #4   Title  Independent in HEP 12/23/2018    Time  12    Status  New      PT LONG TERM GOAL #5   Title  Improve FOTO to </= 61% limitation 12/23/2018    Time  12    Period  Weeks    Status  New            Plan - 10/17/18 1222    Clinical Impression Statement  Focus on knee flexion today. Added prone quad stretch and continued to work on flexion in standing, supine and sitting. Patient requested we work on the bending and walking today. Progressing gradually toward goals of rehab.     Personal Factors and Comorbidities  Age;Comorbidity 1;Comorbidity 2;Comorbidity 3+    Comorbidities  Rt uni-knee replacement; lymphoma; HTN    Examination-Activity Limitations  Bathing;Bed Mobility;Locomotion Level;Stand;Bend;Carry    Examination-Participation Restrictions  Community Activity    Rehab Potential  Good    PT Frequency  2x / week    PT Duration  8 weeks    PT Treatment/Interventions  Patient/family education;ADLs/Self Care Home Management;Cryotherapy;Moist Heat;Gait training;Therapeutic activities;Therapeutic exercise;Neuromuscular re-education;Dry needling;Manual techniques    PT Next Visit Plan  review HEP; progres with stretching and strengthening exercises     PT Home Exercise Plan  [email or web address]    Consulted and Agree with Plan of Care  Patient       Patient will benefit from skilled therapeutic intervention in order to improve the following deficits and impairments:  Postural dysfunction, Improper body mechanics, Pain, Increased fascial restricitons, Increased muscle spasms, Hypomobility, Decreased strength, Decreased range of motion,  Decreased mobility, Decreased endurance  Visit Diagnosis: Acute pain of left knee  Other symptoms and signs involving the musculoskeletal system  Muscle weakness (generalized)  Other abnormalities of gait and mobility     Problem List Patient Active Problem List   Diagnosis Date Noted  . MTHFR gene mutation ([place]) 10/09/2018  . Primary localized osteoarthritis of left knee 09/24/2018  . S/P left unicompartmental knee replacement 09/24/2018  . Fatigue 09/03/2018  . Myalgia 09/03/2018  . Vertigo 09/03/2018  . Sebaceous cyst 09/03/2018  . Callus of  foot 09/03/2018  . Primary localized osteoarthritis of right knee 06/18/2018  . Status post right partial knee replacement 06/18/2018  . Nasal polyposis 05/16/2018  . Class 1 obesity due to excess calories with serious comorbidity and body mass index (BMI) of 30.0 to 30.9 in adult 07/16/2017  . DDD (degenerative disc disease), cervical 07/16/2017  . GERD (gastroesophageal reflux disease) 07/16/2017  . Other hyperlipidemia 09/27/2016  . Paroxysmal atrial fibrillation ([place]) 09/27/2016  . Benign essential hypertension 09/15/2015  . Follicular lymphoma ([place]) 12/18/2014  . Hypothyroidism 01/14/2014  . Carpal tunnel syndrome on both sides 06/30/2013  . Dyspnea 12/25/2012    Karlye Ihrig Nilda Simmer PT, MPH  10/17/2018, 1:08 PM  [facility] [place], [name], [address] Phone: [phone]   Fax:  [phone]  Name: [name] MRN: [id number] Date of Birth: 03/23/1935

## 2018-10-21 ENCOUNTER — Encounter: Payer: Self-pay | Admitting: Rehabilitative and Restorative Service Providers"

## 2018-10-21 ENCOUNTER — Other Ambulatory Visit: Payer: Self-pay

## 2018-10-21 ENCOUNTER — Ambulatory Visit (INDEPENDENT_AMBULATORY_CARE_PROVIDER_SITE_OTHER): Payer: Medicare Other | Admitting: Rehabilitative and Restorative Service Providers"

## 2018-10-21 DIAGNOSIS — R29898 Other symptoms and signs involving the musculoskeletal system: Secondary | ICD-10-CM | POA: Diagnosis not present

## 2018-10-21 DIAGNOSIS — M25562 Pain in left knee: Secondary | ICD-10-CM | POA: Diagnosis not present

## 2018-10-21 DIAGNOSIS — R2689 Other abnormalities of gait and mobility: Secondary | ICD-10-CM | POA: Diagnosis not present

## 2018-10-21 DIAGNOSIS — M6281 Muscle weakness (generalized): Secondary | ICD-10-CM | POA: Diagnosis not present

## 2018-10-21 NOTE — Therapy (Signed)
[place], [name], [address] Phone: [phone]   Fax:  [phone]  Physical Therapy Treatment  Patient Details  Name: [name] MRN: [id number] Date of Birth: 1934-10-16 Referring Provider (PT): Dr Marchia Bond    Encounter Date: 10/21/2018  PT End of Session - 10/21/18 1016    Visit Number  6    Number of Visits  24    Date for PT Re-Evaluation  12/23/18    PT Start Time  1008    PT Stop Time  1105    PT Time Calculation (min)  57 min    Activity Tolerance  Patient tolerated treatment well       Past Medical History:  Diagnosis Date  . Arthritis   . Asthma   . Atrial fibrillation (HCC)    paroxysmal , addressed  with metoprolol   . Chronic sinusitis   . Complication of anesthesia   . Dyspnea    sob on exertion, reports at her pre-op today 09-13-2018 that her cardiologist has planned for her to do a ECHO for evaluation   . Family history of adverse reaction to anesthesia    sister is hard to wake up from Anesthesia  . Follicular lymphoma ([place])    received Chemotherapy (2 years ago)  and radiation (last 12/2017)  . Hyperlipidemia   . Hypertension   . Hypothyroidism   . Meniere's disease   . PONV (postoperative nausea and vomiting)   . Primary localized osteoarthritis of left knee 09/24/2018  . Primary localized osteoarthritis of right knee 06/18/2018  . Reflux     Past Surgical History:  Procedure Laterality Date  . ABDOMINAL HYSTERECTOMY    . BONE MARROW BIOPSY  06/16/2014   at [place]     bilateral  . CHOLECYSTECTOMY    . KNEE ARTHROSCOPY    . PARTIAL KNEE ARTHROPLASTY Right 06/18/2018   Procedure: UNICOMPARTMENTAL KNEE;  Surgeon: Marchia Bond, MD;  Location: [facility];  Service: Orthopedics;  Laterality: Right;  . PARTIAL KNEE ARTHROPLASTY Left 09/24/2018   Procedure: UNICOMPARTMENTAL KNEE;  Surgeon: Marchia Bond, MD;  Location: [facility];   Service: Orthopedics;  Laterality: Left;    There were no vitals filed for this visit.  Subjective Assessment - 10/21/18 1017    Subjective  Really sore after last treatment. Still sore some over the weekend and today.     Currently in Pain?  No/denies         [facility] PT Assessment - 10/21/18 0001      Assessment   Medical Diagnosis  Lt uni-knee replacement     Referring Provider (PT)  Dr Marchia Bond     Onset Date/Surgical Date  09/24/18    Hand Dominance  Right    Next MD Visit  11/06/2018    Prior Therapy  here for Rt knee       AROM   Right Knee Extension  0    Right Knee Flexion  121    Left Knee Extension  0    Left Knee Flexion  116                   [facility] Adult PT Treatment/Exercise - 10/21/18 0001      Knee/Hip Exercises: Aerobic   Nustep  L5 x 10 min      Knee/Hip Exercises: Standing   Hip Abduction  AROM;Stengthening;10 reps;Left;Right;2 sets;Knee straight    Abduction  Limitations  green TB - leading out with heel     Lateral Step Up  Left;2 sets;10 reps;Hand Hold: 2;Step Height: 6"    Forward Step Up  Left;2 sets;10 reps;Hand Hold: 2;Step Height: 6"    Gait Training  working on gait pattern with release of Lt knee in swing phase and equal stride/wt bearing       Knee/Hip Exercises: Seated   Other Seated Knee/Hip Exercises  working on knee flexion seated on high low table using strap to assist with knee flexion 20-30 sec hold x 5      Knee/Hip Exercises: Supine   Heel Slides  AAROM;Left;5 reps   assist w/ stretch strap 10 sec hold    Other Supine Knee/Hip Exercises  holding hip into flexion using strap at posterior thigh for knee flexion assisted with gravity 3-4 reps 20-30 sec       Moist Heat Therapy   Number Minutes Moist Heat  10 Minutes    Moist Heat Location  --   anterior/posterior thigh per & during ROM knee flex sitting     Vasopneumatic   Number Minutes Vasopneumatic   15 minutes    Vasopnuematic Location   Knee    Vasopneumatic  Pressure  Low    Vasopneumatic Temperature   34 deg      Manual Therapy   Manual therapy comments  pt sitting     Soft tissue mobilization  STM through quad working through the distal quad in area of banded tightness     Passive ROM  Lt knee into flexion in supine and sitting                PT Short Term Goals - 09/30/18 0920      PT SHORT TERM GOAL #1   Title  Independent in initial HEP 11/11/2018    Time  6    Period  Weeks    Status  New      PT SHORT TERM GOAL #2   Title  Abulation with least assistive device with good gait pattern 11/11/2018    Time  6    Period  Weeks    Status  New        PT Long Term Goals - 09/30/18 0920      PT LONG TERM GOAL #1   Title  Increased AROM Rt knee 100 deg flexion to 0 deg exension 12/23/2018    Time  12    Period  Weeks    Status  New      PT LONG TERM GOAL #2   Title  Increased strength to 4+/5 to 5/5 Rt LE 12/23/2018    Time  12    Period  Weeks    Status  New      PT LONG TERM GOAL #3   Title  Ambulation for community distances without assistive device or SPC as indicated 12/23/2018    Time  12    Period  Weeks    Status  New      PT LONG TERM GOAL #4   Title  Independent in HEP 12/23/2018    Time  12    Status  New      PT LONG TERM GOAL #5   Title  Improve FOTO to </= 61% limitation 12/23/2018    Time  12    Period  Weeks    Status  New            Plan -  10/21/18 1017    Clinical Impression Statement  Some soreness following last tretment but tolerated exercise and PROM well and felt it really helped. Continued work in clinic with PROM and strengthening. Proressing well.     Personal Factors and Comorbidities  Age;Comorbidity 1;Comorbidity 2;Comorbidity 3+    Comorbidities  Rt uni-knee replacement; lymphoma; HTN    Examination-Activity Limitations  Bathing;Bed Mobility;Locomotion Level;Stand;Bend;Carry    Examination-Participation Restrictions  Community Activity    Rehab Potential  Good    PT  Frequency  2x / week    PT Duration  8 weeks    PT Treatment/Interventions  Patient/family education;ADLs/Self Care Home Management;Cryotherapy;Moist Heat;Gait training;Therapeutic activities;Therapeutic exercise;Neuromuscular re-education;Dry needling;Manual techniques    PT Next Visit Plan  review HEP; progres with stretching and strengthening exercises     PT Home Exercise Plan  [email or web address]    Consulted and Agree with Plan of Care  Patient       Patient will benefit from skilled therapeutic intervention in order to improve the following deficits and impairments:  Postural dysfunction, Improper body mechanics, Pain, Increased fascial restricitons, Increased muscle spasms, Hypomobility, Decreased strength, Decreased range of motion, Decreased mobility, Decreased endurance  Visit Diagnosis: Acute pain of left knee  Other symptoms and signs involving the musculoskeletal system  Muscle weakness (generalized)  Other abnormalities of gait and mobility     Problem List Patient Active Problem List   Diagnosis Date Noted  . MTHFR gene mutation ([place]) 10/09/2018  . Primary localized osteoarthritis of left knee 09/24/2018  . S/P left unicompartmental knee replacement 09/24/2018  . Fatigue 09/03/2018  . Myalgia 09/03/2018  . Vertigo 09/03/2018  . Sebaceous cyst 09/03/2018  . Callus of foot 09/03/2018  . Primary localized osteoarthritis of right knee 06/18/2018  . Status post right partial knee replacement 06/18/2018  . Nasal polyposis 05/16/2018  . Class 1 obesity due to excess calories with serious comorbidity and body mass index (BMI) of 30.0 to 30.9 in adult 07/16/2017  . DDD (degenerative disc disease), cervical 07/16/2017  . GERD (gastroesophageal reflux disease) 07/16/2017  . Other hyperlipidemia 09/27/2016  . Paroxysmal atrial fibrillation ([place]) 09/27/2016  . Benign essential hypertension 09/15/2015  . Follicular lymphoma ([place]) 12/18/2014  . Hypothyroidism 01/14/2014  . Carpal  tunnel syndrome on both sides 06/30/2013  . Dyspnea 12/25/2012    Brandun Pinn Nilda Simmer PT, MPH  10/21/2018, 10:59 AM  [facility] [place], [name], [address] Phone: [phone]   Fax:  [phone]  Name: NEIDY GUERRIERI MRN: [id number] Date of Birth: 10-23-1934

## 2018-10-24 ENCOUNTER — Other Ambulatory Visit: Payer: Self-pay

## 2018-10-24 ENCOUNTER — Encounter: Payer: Self-pay | Admitting: Rehabilitative and Restorative Service Providers"

## 2018-10-24 ENCOUNTER — Ambulatory Visit (INDEPENDENT_AMBULATORY_CARE_PROVIDER_SITE_OTHER): Payer: Medicare Other | Admitting: Rehabilitative and Restorative Service Providers"

## 2018-10-24 DIAGNOSIS — R2689 Other abnormalities of gait and mobility: Secondary | ICD-10-CM

## 2018-10-24 DIAGNOSIS — M6281 Muscle weakness (generalized): Secondary | ICD-10-CM

## 2018-10-24 DIAGNOSIS — R29898 Other symptoms and signs involving the musculoskeletal system: Secondary | ICD-10-CM

## 2018-10-24 DIAGNOSIS — M25562 Pain in left knee: Secondary | ICD-10-CM | POA: Diagnosis not present

## 2018-10-24 NOTE — Therapy (Signed)
[place], [name], [address] Phone: [phone]   Fax:  [phone]  Physical Therapy Treatment  Patient Details  Name: [name] MRN: [id number] Date of Birth: 1935/05/26 Referring Provider (PT): Dr Marchia Bond    Encounter Date: 10/24/2018  PT End of Session - 10/24/18 1119    Visit Number  7    Number of Visits  24    Date for PT Re-Evaluation  12/23/18    PT Start Time  1055    PT Stop Time  1154    PT Time Calculation (min)  59 min    Activity Tolerance  Patient tolerated treatment well       Past Medical History:  Diagnosis Date  . Arthritis   . Asthma   . Atrial fibrillation (HCC)    paroxysmal , addressed  with metoprolol   . Chronic sinusitis   . Complication of anesthesia   . Dyspnea    sob on exertion, reports at her pre-op today 09-13-2018 that her cardiologist has planned for her to do a ECHO for evaluation   . Family history of adverse reaction to anesthesia    sister is hard to wake up from Anesthesia  . Follicular lymphoma ([place])    received Chemotherapy (2 years ago)  and radiation (last 12/2017)  . Hyperlipidemia   . Hypertension   . Hypothyroidism   . Meniere's disease   . PONV (postoperative nausea and vomiting)   . Primary localized osteoarthritis of left knee 09/24/2018  . Primary localized osteoarthritis of right knee 06/18/2018  . Reflux     Past Surgical History:  Procedure Laterality Date  . ABDOMINAL HYSTERECTOMY    . BONE MARROW BIOPSY  06/16/2014   at [facility]     bilateral  . CHOLECYSTECTOMY    . KNEE ARTHROSCOPY    . PARTIAL KNEE ARTHROPLASTY Right 06/18/2018   Procedure: UNICOMPARTMENTAL KNEE;  Surgeon: Marchia Bond, MD;  Location: [facility];  Service: Orthopedics;  Laterality: Right;  . PARTIAL KNEE ARTHROPLASTY Left 09/24/2018   Procedure: UNICOMPARTMENTAL KNEE;  Surgeon: Marchia Bond, MD;  Location: [facility];   Service: Orthopedics;  Laterality: Left;    There were no vitals filed for this visit.  Subjective Assessment - 10/24/18 1120    Subjective  Sore today. Sleeping some on her side and that lets her sleep better     Currently in Pain?  Yes    Pain Score  3     Pain Location  Knee    Pain Orientation  Left    Pain Descriptors / Indicators  Sore    Pain Onset  More than a month ago    Pain Frequency  Intermittent                       [facility] Adult PT Treatment/Exercise - 10/24/18 0001      Knee/Hip Exercises: Stretches   Gastroc Stretch  Left;Right;3 reps;30 seconds    Gastroc Stretch Limitations  repeated on incline board x 3 reps on 2nd and 3rd level       Knee/Hip Exercises: Aerobic   Nustep  L5 x 10 min (UE 10)      Knee/Hip Exercises: Standing   Lateral Step Up  Left;2 sets;10 reps;Hand Hold: 2;Step Height: 6"    Forward Step Up  Left;2 sets;10 reps;Hand Hold: 2;Step Height: 6"    SLS  Lt/Rt  x 10-15 sec UE suipport as needed x 5 reps each side     Other Standing Knee Exercises  tandem walking x 10 ft x 5; backwards walking x 5; sidesteps each direction x 5       Knee/Hip Exercises: Seated   Other Seated Knee/Hip Exercises  working on knee flexion seated on high low table using strap to assist with knee flexion 20-30 sec hold x 5      Knee/Hip Exercises: Supine   Heel Slides  AAROM;Left;5 reps   assist w/ stretch strap 10 sec hold      Vasopneumatic   Number Minutes Vasopneumatic   15 minutes    Vasopnuematic Location   Knee    Vasopneumatic Pressure  Low    Vasopneumatic Temperature   34 deg      Manual Therapy   Manual therapy comments  pt sitting     Soft tissue mobilization  STM through quad working through the distal quad in area of banded tightness     Passive ROM  Lt knee into flexion in supine and sitting                PT Short Term Goals - 09/30/18 0920      PT SHORT TERM GOAL #1   Title  Independent in initial HEP 11/11/2018     Time  6    Period  Weeks    Status  New      PT SHORT TERM GOAL #2   Title  Abulation with least assistive device with good gait pattern 11/11/2018    Time  6    Period  Weeks    Status  New        PT Long Term Goals - 09/30/18 0920      PT LONG TERM GOAL #1   Title  Increased AROM Rt knee 100 deg flexion to 0 deg exension 12/23/2018    Time  12    Period  Weeks    Status  New      PT LONG TERM GOAL #2   Title  Increased strength to 4+/5 to 5/5 Rt LE 12/23/2018    Time  12    Period  Weeks    Status  New      PT LONG TERM GOAL #3   Title  Ambulation for community distances without assistive device or SPC as indicated 12/23/2018    Time  12    Period  Weeks    Status  New      PT LONG TERM GOAL #4   Title  Independent in HEP 12/23/2018    Time  12    Status  New      PT LONG TERM GOAL #5   Title  Improve FOTO to </= 61% limitation 12/23/2018    Time  12    Period  Weeks    Status  New            Plan - 10/24/18 1120    Clinical Impression Statement  Continued gradual porgress toward stated goals of therapy. Added exercises without difficulty. Responds well to passive stretch into flexion seated on high/low table.     Personal Factors and Comorbidities  Age;Comorbidity 1;Comorbidity 2;Comorbidity 3+    Comorbidities  Rt uni-knee replacement; lymphoma; HTN    Examination-Activity Limitations  Bathing;Bed Mobility;Locomotion Level;Stand;Bend;Carry    Examination-Participation Restrictions  Community Activity    Rehab Potential  Good    PT Frequency  2x / week    PT Duration  8 weeks    PT Treatment/Interventions  Patient/family education;ADLs/Self Care Home Management;Cryotherapy;Moist Heat;Gait training;Therapeutic activities;Therapeutic exercise;Neuromuscular re-education;Dry needling;Manual techniques    PT Next Visit Plan  review HEP; progres with stretching and strengthening exercises     PT Home Exercise Plan  [email or web address]    Consulted and Agree with Plan of Care   Patient       Patient will benefit from skilled therapeutic intervention in order to improve the following deficits and impairments:  Postural dysfunction, Improper body mechanics, Pain, Increased fascial restricitons, Increased muscle spasms, Hypomobility, Decreased strength, Decreased range of motion, Decreased mobility, Decreased endurance  Visit Diagnosis: Acute pain of left knee  Other symptoms and signs involving the musculoskeletal system  Muscle weakness (generalized)  Other abnormalities of gait and mobility     Problem List Patient Active Problem List   Diagnosis Date Noted  . MTHFR gene mutation ([place]) 10/09/2018  . Primary localized osteoarthritis of left knee 09/24/2018  . S/P left unicompartmental knee replacement 09/24/2018  . Fatigue 09/03/2018  . Myalgia 09/03/2018  . Vertigo 09/03/2018  . Sebaceous cyst 09/03/2018  . Callus of foot 09/03/2018  . Primary localized osteoarthritis of right knee 06/18/2018  . Status post right partial knee replacement 06/18/2018  . Nasal polyposis 05/16/2018  . Class 1 obesity due to excess calories with serious comorbidity and body mass index (BMI) of 30.0 to 30.9 in adult 07/16/2017  . DDD (degenerative disc disease), cervical 07/16/2017  . GERD (gastroesophageal reflux disease) 07/16/2017  . Other hyperlipidemia 09/27/2016  . Paroxysmal atrial fibrillation ([place]) 09/27/2016  . Benign essential hypertension 09/15/2015  . Follicular lymphoma ([place]) 12/18/2014  . Hypothyroidism 01/14/2014  . Carpal tunnel syndrome on both sides 06/30/2013  . Dyspnea 12/25/2012    Hannan Hutmacher Nilda Simmer PT, MPH  10/24/2018, 11:51 AM  [facility] [place], [name], [address] Phone: [phone]   Fax:  [phone]  Name: [name] MRN: [id number] Date of Birth: 06-15-1935

## 2018-10-29 ENCOUNTER — Ambulatory Visit (INDEPENDENT_AMBULATORY_CARE_PROVIDER_SITE_OTHER): Payer: Medicare Other | Admitting: Physical Therapy

## 2018-10-29 ENCOUNTER — Other Ambulatory Visit: Payer: Self-pay

## 2018-10-29 ENCOUNTER — Encounter: Payer: Self-pay | Admitting: Physical Therapy

## 2018-10-29 DIAGNOSIS — M6281 Muscle weakness (generalized): Secondary | ICD-10-CM

## 2018-10-29 DIAGNOSIS — R29898 Other symptoms and signs involving the musculoskeletal system: Secondary | ICD-10-CM | POA: Diagnosis not present

## 2018-10-29 DIAGNOSIS — M25562 Pain in left knee: Secondary | ICD-10-CM | POA: Diagnosis not present

## 2018-10-29 NOTE — Therapy (Signed)
[place], [name], [address] Phone: [phone]   Fax:  [phone]  Physical Therapy Treatment  Patient Details  Name: [name] MRN: [id number] Date of Birth: [birth year] Referring Provider (PT): Dr Marchia Bond    Encounter Date: 10/29/2018  PT End of Session - 10/29/18 1109    Visit Number  8    Number of Visits  24    Date for PT Re-Evaluation  12/23/18    PT Start Time  1003    PT Stop Time  1100    PT Time Calculation (min)  57 min    Activity Tolerance  Patient tolerated treatment well    Behavior During Therapy  [facility] for tasks assessed/performed       Past Medical History:  Diagnosis Date  . Arthritis   . Asthma   . Atrial fibrillation (HCC)    paroxysmal , addressed  with metoprolol   . Chronic sinusitis   . Complication of anesthesia   . Dyspnea    sob on exertion, reports at her pre-op today 09-13-2018 that her cardiologist has planned for her to do a ECHO for evaluation   . Family history of adverse reaction to anesthesia    sister is hard to wake up from Anesthesia  . Follicular lymphoma ([place])    received Chemotherapy (2 years ago)  and radiation (last 12/2017)  . Hyperlipidemia   . Hypertension   . Hypothyroidism   . Meniere's disease   . PONV (postoperative nausea and vomiting)   . Primary localized osteoarthritis of left knee 09/24/2018  . Primary localized osteoarthritis of right knee 06/18/2018  . Reflux     Past Surgical History:  Procedure Laterality Date  . ABDOMINAL HYSTERECTOMY    . BONE MARROW BIOPSY  06/16/2014   at [facility]     bilateral  . CHOLECYSTECTOMY    . KNEE ARTHROSCOPY    . PARTIAL KNEE ARTHROPLASTY Right 06/18/2018   Procedure: UNICOMPARTMENTAL KNEE;  Surgeon: Marchia Bond, MD;  [facility];  Service: Orthopedics;  Laterality: Right;  . PARTIAL KNEE ARTHROPLASTY Left 09/24/2018   Procedure:  UNICOMPARTMENTAL KNEE;  Surgeon: Marchia Bond, MD;  [facility];  Service: Orthopedics;  Laterality: Left;    There were no vitals filed for this visit.  Subjective Assessment - 10/29/18 1141    Subjective  "I'm just stiff"     Patient Stated Goals  get the knee moving and walk normally     Currently in Pain?  No/denies    Pain Score  0-No pain    Pain Location  Knee    Pain Orientation  Left         [facility] PT Assessment - 10/29/18 0001      Assessment   Medical Diagnosis  Lt uni-knee replacement     Referring Provider (PT)  Dr Marchia Bond     Onset Date/Surgical Date  09/24/18    Hand Dominance  Right    Next MD Visit  11/06/2018    Prior Therapy  here for Rt knee       AROM   Left Knee Extension  0    Left Knee Flexion  113      Strength   Left Hip Flexion  4+/5    Left Hip ABduction  4+/5      Flexibility   Quadriceps  Lt 80 deg  [facility] Adult PT Treatment/Exercise - 10/29/18 0001      Knee/Hip Exercises: Stretches   Passive Hamstring Stretch  Left;2 reps;20 seconds    Quad Stretch  Left;3 reps;30 seconds   prone with strap   Gastroc Stretch  Both;3 reps;30 seconds   incline board, level 2 and 3      Knee/Hip Exercises: Aerobic   Recumbent Bike  slow full revolutions then L1, total time of 7 min       Knee/Hip Exercises: Standing   Forward Step Up  Left;2 sets;10 reps;Hand Hold: 2;Step Height: 6"      Knee/Hip Exercises: Seated   Long Arc Quad  Left;1 set;10 reps;Weights   5 sec hold in ext   [employer] Weight  3 lbs.    Other Seated Knee/Hip Exercises  seated scoot, x 10 reps with 5-10 sec hold to increase ROM      Knee/Hip Exercises: Supine   Heel Slides Limitations  1 rep for measurement    Bridges  1 set x10     Knee/Hip Exercises: Sidelying   Hip ABduction  Strengthening;Left;10 reps      Vasopneumatic   Number Minutes Vasopneumatic   15 minutes    Vasopnuematic Location   Knee    Vasopneumatic Pressure  Low    Vasopneumatic  Temperature   34 deg      Manual Therapy   Manual Therapy  Taping    Kinesiotex  Create Space      Kinesiotix   Create Space  sensitive skin tape in zig zag pattern over incision to assist in scar managment               PT Short Term Goals - 10/29/18 1145      PT SHORT TERM GOAL #1   Title  Independent in initial HEP 11/11/2018    Time  6    Period  Weeks    Status  Achieved      PT SHORT TERM GOAL #2   Title  Abulation with least assistive device with good gait pattern 11/11/2018    Time  6    Period  Weeks    Status  Achieved        PT Long Term Goals - 10/29/18 1145      PT LONG TERM GOAL #1   Title  Increased AROM Rt knee 100 deg flexion to 0 deg exension 12/23/2018    Time  12    Period  Weeks    Status  Partially Met      PT LONG TERM GOAL #2   Title  Increased strength to 4+/5 to 5/5 Rt LE 12/23/2018    Time  12    Period  Weeks    Status  Partially Met      PT LONG TERM GOAL #3   Title  Ambulation for community distances without assistive device or SPC as indicated 12/23/2018    Time  12    Period  Weeks    Status  Partially Met      PT LONG TERM GOAL #4   Title  Independent in HEP 12/23/2018    Time  12    Period  Weeks    Status  On-going      PT LONG TERM GOAL #5   Title  Improve FOTO to </= 61% limitation 12/23/2018    Time  12    Period  Weeks    Status  On-going  Plan - 10/29/18 1137    Clinical Impression Statement  Continued gradual gains towards goals each week.  Her Lt knee ROM was slightly less than last visit.  Lt quad remains tight.  She tolerated all exercises well, with minimal increase in pain.  Trial of tape applied to incision to assist with scar management. She has partially met her goals and will benefit from continued PT intervention to max functional mob and safety.     Rehab Potential  Good    PT Frequency  2x / week    PT Duration  8 weeks    PT Treatment/Interventions  Patient/family education;ADLs/Self  Care Home Management;Cryotherapy;Moist Heat;Gait training;Therapeutic activities;Therapeutic exercise;Neuromuscular re-education;Dry needling;Manual techniques    PT Next Visit Plan  continue progressive Lt knee functional strengthening/ROM.  PRogress HEP as tolerated.     PT Home Exercise Plan  [email or web address]    Consulted and Agree with Plan of Care  Patient       Patient will benefit from skilled therapeutic intervention in order to improve the following deficits and impairments:  Postural dysfunction, Improper body mechanics, Pain, Increased fascial restricitons, Increased muscle spasms, Hypomobility, Decreased strength, Decreased range of motion, Decreased mobility, Decreased endurance  Visit Diagnosis: Acute pain of left knee  Other symptoms and signs involving the musculoskeletal system  Muscle weakness (generalized)     Problem List Patient Active Problem List   Diagnosis Date Noted  . MTHFR gene mutation ([place]) 10/09/2018  . Primary localized osteoarthritis of left knee 09/24/2018  . S/P left unicompartmental knee replacement 09/24/2018  . Fatigue 09/03/2018  . Myalgia 09/03/2018  . Vertigo 09/03/2018  . Sebaceous cyst 09/03/2018  . Callus of foot 09/03/2018  . Primary localized osteoarthritis of right knee 06/18/2018  . Status post right partial knee replacement 06/18/2018  . Nasal polyposis 05/16/2018  . Class 1 obesity due to excess calories with serious comorbidity and body mass index (BMI) of 30.0 to 30.9 in adult 07/16/2017  . DDD (degenerative disc disease), cervical 07/16/2017  . GERD (gastroesophageal reflux disease) 07/16/2017  . Other hyperlipidemia 09/27/2016  . Paroxysmal atrial fibrillation ([place]) 09/27/2016  . Benign essential hypertension 09/15/2015  . Follicular lymphoma ([place]) 12/18/2014  . Hypothyroidism 01/14/2014  . Carpal tunnel syndrome on both sides 06/30/2013  . Dyspnea 12/25/2012   Kerin Perna, PTA 10/29/18 11:48 AM  [place], [name], [address] Phone: [phone]   Fax:  [phone]  Name: KRITHIKA TOME MRN: [id number] Date of Birth: Nov 30, 1934

## 2018-10-31 ENCOUNTER — Encounter: Payer: Self-pay | Admitting: Physical Therapy

## 2018-10-31 ENCOUNTER — Other Ambulatory Visit: Payer: Self-pay

## 2018-10-31 ENCOUNTER — Ambulatory Visit (INDEPENDENT_AMBULATORY_CARE_PROVIDER_SITE_OTHER): Payer: Medicare Other | Admitting: Physical Therapy

## 2018-10-31 DIAGNOSIS — R29898 Other symptoms and signs involving the musculoskeletal system: Secondary | ICD-10-CM

## 2018-10-31 DIAGNOSIS — M6281 Muscle weakness (generalized): Secondary | ICD-10-CM | POA: Diagnosis not present

## 2018-10-31 DIAGNOSIS — M25562 Pain in left knee: Secondary | ICD-10-CM

## 2018-10-31 NOTE — Therapy (Addendum)
[place], [name], [address] Phone: [phone]   Fax:  [phone]  Physical Therapy Treatment  Patient Details  Name: [name] MRN: [id number] Date of Birth: April 22, 1935 Referring Provider (PT): Dr Marchia Bond    Encounter Date: 10/31/2018  PT End of Session - 10/31/18 1015    Visit Number  9    Number of Visits  24    Date for PT Re-Evaluation  12/23/18    PT Start Time  1004    PT Stop Time  1101    PT Time Calculation (min)  57 min       Past Medical History:  Diagnosis Date  . Arthritis   . Asthma   . Atrial fibrillation (HCC)    paroxysmal , addressed  with metoprolol   . Chronic sinusitis   . Complication of anesthesia   . Dyspnea    sob on exertion, reports at her pre-op today 09-13-2018 that her cardiologist has planned for her to do a ECHO for evaluation   . Family history of adverse reaction to anesthesia    sister is hard to wake up from Anesthesia  . Follicular lymphoma ([place])    received Chemotherapy (2 years ago)  and radiation (last 12/2017)  . Hyperlipidemia   . Hypertension   . Hypothyroidism   . Meniere's disease   . PONV (postoperative nausea and vomiting)   . Primary localized osteoarthritis of left knee 09/24/2018  . Primary localized osteoarthritis of right knee 06/18/2018  . Reflux     Past Surgical History:  Procedure Laterality Date  . ABDOMINAL HYSTERECTOMY    . BONE MARROW BIOPSY  06/16/2014   at [facility]     bilateral  . CHOLECYSTECTOMY    . KNEE ARTHROSCOPY    . PARTIAL KNEE ARTHROPLASTY Right 06/18/2018   Procedure: UNICOMPARTMENTAL KNEE;  Surgeon: Marchia Bond, MD;  [facility];  Service: Orthopedics;  Laterality: Right;  . PARTIAL KNEE ARTHROPLASTY Left 09/24/2018   Procedure: UNICOMPARTMENTAL KNEE;  Surgeon: Marchia Bond, MD;  [facility];  Service: Orthopedics;  Laterality: Left;    There were no  vitals filed for this visit.  Subjective Assessment - 10/31/18 1018    Subjective  Pt reports she was up all night, couldn't sleep.  Both her knees are painful today.  She took tylenol prior to arrival.     Patient Stated Goals  get the knee moving and walk normally     Currently in Pain?  Yes    Pain Score  6     Pain Location  Knee    Pain Orientation  Left;Right    Pain Descriptors / Indicators  Sore    Aggravating Factors   bending knee    Pain Relieving Factors  ice; medication          [facility] PT Assessment - 10/31/18 0001      Assessment   Medical Diagnosis  Lt uni-knee replacement     Referring Provider (PT)  Dr Marchia Bond     Onset Date/Surgical Date  09/24/18    Hand Dominance  Right    Next MD Visit  11/06/2018    Prior Therapy  here for Rt knee       Observation/Other Assessments   Focus on Therapeutic Outcomes (FOTO)   49% limitation  (improved from 90% limitation)      AROM   Left Knee Extension  0    Left Knee Flexion  116      Strength   Left Hip Flexion  --   5-/5   Left Hip Extension  3+/5    Left Hip ABduction  --   5-/5   Right/Left Knee  Left    Right Knee Flexion  5/5    Right Knee Extension  5/5        [facility] Adult PT Treatment/Exercise - 10/31/18 0001      Knee/Hip Exercises: Stretches   Passive Hamstring Stretch  Left;Right;2 reps;30 seconds    Quad Stretch  Right;Left;30 seconds;3 reps   seated with foot under chair.    Gastroc Stretch  Both;3 reps;30 seconds   incline board, level 2 and 3    Other Knee/Hip Stretches  standing hip adductor stretch x 15 sec x 2 reps each leg (pt reported increased LBP despite modification)    Other Knee/Hip Stretches  Lt foot on 24" step with forward lunge to increase Lt knee flexion ROM, held 30 sec x 3 reps       Knee/Hip Exercises: Aerobic   Recumbent Bike  slow full revolutions then L1, total time of 8 min for warm up and ROM    Other Aerobic  walking single laps between exercises to decrease  stiffness (80 ft laps)      Knee/Hip Exercises: Standing   Lateral Step Up  Left;1 set;15 reps   crossover - step ups with BUE support on rail   Step Down  Right;1 set;15 reps      Knee/Hip Exercises: Seated   Other Seated Knee/Hip Exercises  seated scoot, x 10 reps with 5-10 sec hold to increase ROM    Sit to Sand  10 reps   eccentric lowering to low mat table     Vasopneumatic   Number Minutes Vasopneumatic   15 minutes    Vasopnuematic Location   Knee    Vasopneumatic Pressure  Low    Vasopneumatic Temperature   34 deg               PT Short Term Goals - 10/29/18 1145      PT SHORT TERM GOAL #1   Title  Independent in initial HEP 11/11/2018    Time  6    Period  Weeks    Status  Achieved      PT SHORT TERM GOAL #2   Title  Abulation with least assistive device with good gait pattern 11/11/2018    Time  6    Period  Weeks    Status  Achieved        PT Long Term Goals - 10/31/18 1131      PT LONG TERM GOAL #1   Title  Increased AROM Rt knee 100 deg flexion to 0 deg exension 12/23/2018    Time  12    Period  Weeks    Status  Partially Met      PT LONG TERM GOAL #2   Title  Increased strength to 4+/5 to 5/5 Rt LE 12/23/2018    Time  12    Period  Weeks    Status  Partially Met   continue weakness in Lt hip ext     PT LONG TERM GOAL #3   Title  Ambulation for community distances without assistive device or SPC as indicated 12/23/2018    Time  12    Period  Weeks    Status  Achieved  PT LONG TERM GOAL #4   Title  Independent in HEP 12/23/2018    Time  12    Period  Weeks    Status  On-going      PT LONG TERM GOAL #5   Title  Improve FOTO to </= 61% limitation 12/23/2018    Time  12    Period  Weeks    Status  Achieved            Plan - 10/31/18 1122    Clinical Impression Statement  Pt has had gradual progress each visit towards therapy goals.  She participated well throughout session, despite elevated pain level.  She reported mild  increase in pain with sit to stand exercise as well as ROM exercises. She has met her FOTO goal.   Pt has partially met her goals and will benefit from continued PT intervention to maximize functional mobility and safety.  Pt will be moving next week, so will be unable to attend therapy sessions.      Rehab Potential  Good    PT Frequency  2x / week    PT Duration  8 weeks    PT Treatment/Interventions  Patient/family education;ADLs/Self Care Home Management;Cryotherapy;Moist Heat;Gait training;Therapeutic activities;Therapeutic exercise;Neuromuscular re-education;Dry needling;Manual techniques    PT Next Visit Plan  will hold therapy until Pt returns to MD per pt request.      PT Home Exercise Plan  [email or web address]    Consulted and Agree with Plan of Care  Patient       Patient will benefit from skilled therapeutic intervention in order to improve the following deficits and impairments:  Postural dysfunction, Improper body mechanics, Pain, Increased fascial restricitons, Increased muscle spasms, Hypomobility, Decreased strength, Decreased range of motion, Decreased mobility, Decreased endurance  Visit Diagnosis: Acute pain of left knee  Other symptoms and signs involving the musculoskeletal system  Muscle weakness (generalized)     Problem List Patient Active Problem List   Diagnosis Date Noted  . MTHFR gene mutation ([place]) 10/09/2018  . Primary localized osteoarthritis of left knee 09/24/2018  . S/P left unicompartmental knee replacement 09/24/2018  . Fatigue 09/03/2018  . Myalgia 09/03/2018  . Vertigo 09/03/2018  . Sebaceous cyst 09/03/2018  . Callus of foot 09/03/2018  . Primary localized osteoarthritis of right knee 06/18/2018  . Status post right partial knee replacement 06/18/2018  . Nasal polyposis 05/16/2018  . Class 1 obesity due to excess calories with serious comorbidity and body mass index (BMI) of 30.0 to 30.9 in adult 07/16/2017  . DDD (degenerative disc disease),  cervical 07/16/2017  . GERD (gastroesophageal reflux disease) 07/16/2017  . Other hyperlipidemia 09/27/2016  . Paroxysmal atrial fibrillation ([place]) 09/27/2016  . Benign essential hypertension 09/15/2015  . Follicular lymphoma ([place]) 12/18/2014  . Hypothyroidism 01/14/2014  . Carpal tunnel syndrome on both sides 06/30/2013  . Dyspnea 12/25/2012   Kerin Perna, PTA 10/31/18 11:32 AM  [place], [name], [address] Phone: [phone]   Fax:  [phone]  Name: [name] MRN: [id number] Date of Birth: 1934-09-01  PHYSICAL THERAPY DISCHARGE SUMMARY  Visits from Start of Care: 9  Current functional level related to goals / functional outcomes: See last progress note for discharge status    Remaining deficits: Needs to continue with HEP to work on ROM and strength    Education / Equipment: HEP  Plan: Patient agrees to discharge.  Patient goals were met. Patient is being discharged due  to meeting the stated rehab goals.  ?????     Celyn P. Helene Kelp PT, MPH 12/11/18 4:58 PM

## 2018-11-06 DIAGNOSIS — M1712 Unilateral primary osteoarthritis, left knee: Secondary | ICD-10-CM | POA: Diagnosis not present

## 2018-11-13 DIAGNOSIS — H40013 Open angle with borderline findings, low risk, bilateral: Secondary | ICD-10-CM | POA: Diagnosis not present

## 2018-11-13 DIAGNOSIS — H04123 Dry eye syndrome of bilateral lacrimal glands: Secondary | ICD-10-CM | POA: Diagnosis not present

## 2018-11-13 DIAGNOSIS — H26492 Other secondary cataract, left eye: Secondary | ICD-10-CM | POA: Diagnosis not present

## 2018-11-14 DIAGNOSIS — C8232 Follicular lymphoma grade IIIa, intrathoracic lymph nodes: Secondary | ICD-10-CM | POA: Diagnosis not present

## 2018-11-14 DIAGNOSIS — C8290 Follicular lymphoma, unspecified, unspecified site: Secondary | ICD-10-CM | POA: Diagnosis not present

## 2018-11-14 DIAGNOSIS — R0600 Dyspnea, unspecified: Secondary | ICD-10-CM | POA: Diagnosis not present

## 2018-11-14 DIAGNOSIS — I4891 Unspecified atrial fibrillation: Secondary | ICD-10-CM | POA: Diagnosis not present

## 2018-11-14 DIAGNOSIS — I10 Essential (primary) hypertension: Secondary | ICD-10-CM | POA: Diagnosis not present

## 2018-11-15 ENCOUNTER — Other Ambulatory Visit: Payer: Self-pay | Admitting: Physician Assistant

## 2018-11-15 MED ORDER — OMEPRAZOLE 40 MG PO CPDR: 40.0000 mg | DELAYED_RELEASE_CAPSULE | ORAL | 1 refills | Status: DC

## 2018-11-22 ENCOUNTER — Other Ambulatory Visit: Payer: Self-pay

## 2018-11-22 MED ORDER — OMEPRAZOLE 40 MG PO CPDR: 40.0000 mg | DELAYED_RELEASE_CAPSULE | ORAL | 1 refills | Status: DC

## 2018-11-26 ENCOUNTER — Other Ambulatory Visit: Payer: Self-pay

## 2018-11-26 MED ORDER — OMEPRAZOLE 40 MG PO CPDR: 40.0000 mg | DELAYED_RELEASE_CAPSULE | ORAL | 1 refills | Status: DC

## 2018-12-04 DIAGNOSIS — M1712 Unilateral primary osteoarthritis, left knee: Secondary | ICD-10-CM | POA: Diagnosis not present

## 2018-12-10 DIAGNOSIS — E039 Hypothyroidism, unspecified: Secondary | ICD-10-CM | POA: Diagnosis not present

## 2018-12-11 LAB — TSH: TSH: 1.81 mIU/L (ref 0.40–4.50)

## 2018-12-12 ENCOUNTER — Other Ambulatory Visit: Payer: Self-pay | Admitting: Osteopathic Medicine

## 2018-12-12 DIAGNOSIS — Z8709 Personal history of other diseases of the respiratory system: Secondary | ICD-10-CM | POA: Diagnosis not present

## 2018-12-12 DIAGNOSIS — Z9221 Personal history of antineoplastic chemotherapy: Secondary | ICD-10-CM | POA: Diagnosis not present

## 2018-12-12 DIAGNOSIS — Z79899 Other long term (current) drug therapy: Secondary | ICD-10-CM | POA: Diagnosis not present

## 2018-12-12 DIAGNOSIS — Z7951 Long term (current) use of inhaled steroids: Secondary | ICD-10-CM | POA: Diagnosis not present

## 2018-12-12 DIAGNOSIS — J302 Other seasonal allergic rhinitis: Secondary | ICD-10-CM | POA: Diagnosis not present

## 2018-12-12 DIAGNOSIS — Z8572 Personal history of non-Hodgkin lymphomas: Secondary | ICD-10-CM | POA: Diagnosis not present

## 2018-12-12 DIAGNOSIS — R0609 Other forms of dyspnea: Secondary | ICD-10-CM | POA: Diagnosis not present

## 2018-12-12 DIAGNOSIS — Z923 Personal history of irradiation: Secondary | ICD-10-CM | POA: Diagnosis not present

## 2018-12-12 MED ORDER — LEVOTHYROXINE SODIUM 112 MCG PO TABS: 112.0000 ug | ORAL_TABLET | Freq: Every day | ORAL | 3 refills | Status: DC

## 2018-12-16 ENCOUNTER — Telehealth: Payer: Self-pay

## 2018-12-16 ENCOUNTER — Other Ambulatory Visit: Payer: Self-pay

## 2018-12-16 MED ORDER — LEVOTHYROXINE SODIUM 112 MCG PO TABS: 112.0000 ug | ORAL_TABLET | Freq: Every day | ORAL | 3 refills | Status: DC

## 2018-12-16 NOTE — Telephone Encounter (Signed)
Called pt - requests for thyroid med to be sent to mail order pharmacy not [facility]. Task completed. Rx cancelled at [facility] and resent to mail order pharmacy. Pt is requesting a rx for severe itching on her bottom to be sent to [facility] on file. Pls advise, thanks.

## 2018-12-16 NOTE — Telephone Encounter (Signed)
Pt called requesting a rx given to her for dry feet. Tried calling back pt for clarification on med request, no answer. Unable to leave a msg, vm box is full.

## 2018-12-16 NOTE — Telephone Encounter (Signed)
Patient called back during lunch and left msg with answering service. Requesting call back.

## 2018-12-17 NOTE — Telephone Encounter (Signed)
Patient states that she is going to continue to take what she is taking now and if she needs to make an appointment she will call back. No further questions at this time.

## 2018-12-17 NOTE — Telephone Encounter (Signed)
Patient needs a virtual visit for new symptom, I would not know what I am treating and I am not going to send something if I do not know what is wrong with her.  She would like to come into the office for me to physically take a look, that is also fine.

## 2019-01-09 DIAGNOSIS — J300 Vasomotor rhinitis: Secondary | ICD-10-CM | POA: Diagnosis not present

## 2019-01-09 DIAGNOSIS — J339 Nasal polyp, unspecified: Secondary | ICD-10-CM | POA: Diagnosis not present

## 2019-01-09 DIAGNOSIS — R0982 Postnasal drip: Secondary | ICD-10-CM | POA: Diagnosis not present

## 2019-01-09 DIAGNOSIS — J328 Other chronic sinusitis: Secondary | ICD-10-CM | POA: Diagnosis not present

## 2019-01-19 ENCOUNTER — Emergency Department (INDEPENDENT_AMBULATORY_CARE_PROVIDER_SITE_OTHER)
Admission: EM | Admit: 2019-01-19 | Discharge: 2019-01-19 | Disposition: A | Discharge status: Home / Self Care | Payer: Medicare Other | Source: Home / Self Care

## 2019-01-19 ENCOUNTER — Other Ambulatory Visit: Payer: Self-pay

## 2019-01-19 ENCOUNTER — Encounter: Payer: Self-pay | Admitting: Emergency Medicine

## 2019-01-19 DIAGNOSIS — M545 Low back pain, unspecified: Secondary | ICD-10-CM

## 2019-01-19 DIAGNOSIS — N3001 Acute cystitis with hematuria: Secondary | ICD-10-CM

## 2019-01-19 LAB — POCT URINALYSIS DIP (MANUAL ENTRY)
Blood, UA: NEGATIVE
Glucose, UA: NEGATIVE mg/dL
Nitrite, UA: NEGATIVE
Protein Ur, POC: NEGATIVE mg/dL
Spec Grav, UA: 1.025 (ref 1.010–1.025)
Urobilinogen, UA: 0.2 E.U./dL
pH, UA: 5.5 (ref 5.0–8.0)

## 2019-01-19 MED ORDER — CEPHALEXIN 500 MG PO CAPS: 500.0000 mg | ORAL_CAPSULE | Freq: Two times a day (BID) | ORAL | 0 refills | Status: DC

## 2019-01-19 NOTE — Discharge Instructions (Signed)
°  Please take your antibiotic as prescribed. A urine culture has been sent to check the severity of your urinary infection and to determine if you are on the most appropriate antibiotic. The results should come back within 2-3 days and you will be notified even if no medication change is needed.  Please stay well hydrated and follow up with your family doctor in 1 week if not improving, sooner if worsening.  You may take your meloxicam (Mobic) and baclofen as prescribed as well as acetaminophen (Tylenol) to help with your back pain.

## 2019-01-19 NOTE — ED Triage Notes (Signed)
Patient twisted getting into a car 2 days ago and since then has had a "catch" in her posterior hip/buttocks area. She is walking with no need for wheelchair for triage. She has taken a prn baclofen today. She has not travelled past 4 weeks.

## 2019-01-19 NOTE — ED Provider Notes (Signed)
[name]    CSN: [id number] Arrival date & time: 01/19/19  1259     History   Chief Complaint Chief Complaint  Patient presents with  . Hip Pain    HPI [name] is a 84 y.o. female.   HPI [name] is a 84 y.o. female presenting to UC with c/o Right lower back and hip pain that started about 2 days ago after she twisted while getting out of a car.  She felt a "catch" in her lower back/hip.  Hx of chronic low back pain and arthritis but this pain is more severe than usual and a little higher up.  She is able to walk w/o assistance. She took baclofen to help with pain today. Pain is 8/10 right now. She Mobic at home but has not taken any for her current back pain.  Denies pain with urination or hematuria but notes she has had mild urgency and is unsure if that is due to the back spasms.  She has had UTIs in the past. Denies fever, chills, n/v/d.    Past Medical History:  Diagnosis Date  . Arthritis   . Asthma   . Atrial fibrillation (HCC)    paroxysmal , addressed  with metoprolol   . Chronic sinusitis   . Complication of anesthesia   . Dyspnea    sob on exertion, reports at her pre-op today 09-13-2018 that her cardiologist has planned for her to do a ECHO for evaluation   . Family history of adverse reaction to anesthesia    sister is hard to wake up from Anesthesia  . Follicular lymphoma ([place])    received Chemotherapy (2 years ago)  and radiation (last 12/2017)  . Hyperlipidemia   . Hypertension   . Hypothyroidism   . Meniere's disease   . PONV (postoperative nausea and vomiting)   . Primary localized osteoarthritis of left knee 09/24/2018  . Primary localized osteoarthritis of right knee 06/18/2018  . Reflux     Patient Active Problem List   Diagnosis Date Noted  . MTHFR gene mutation ([place]) 10/09/2018  . Primary localized osteoarthritis of left knee 09/24/2018  . S/P left unicompartmental knee replacement 09/24/2018  . Fatigue 09/03/2018  .  Myalgia 09/03/2018  . Vertigo 09/03/2018  . Sebaceous cyst 09/03/2018  . Callus of foot 09/03/2018  . Primary localized osteoarthritis of right knee 06/18/2018  . Status post right partial knee replacement 06/18/2018  . Nasal polyposis 05/16/2018  . Class 1 obesity due to excess calories with serious comorbidity and body mass index (BMI) of 30.0 to 30.9 in adult 07/16/2017  . DDD (degenerative disc disease), cervical 07/16/2017  . GERD (gastroesophageal reflux disease) 07/16/2017  . Other hyperlipidemia 09/27/2016  . Paroxysmal atrial fibrillation ([place]) 09/27/2016  . Benign essential hypertension 09/15/2015  . Follicular lymphoma ([place]) 12/18/2014  . Hypothyroidism 01/14/2014  . Carpal tunnel syndrome on both sides 06/30/2013  . Dyspnea 12/25/2012    Past Surgical History:  Procedure Laterality Date  . ABDOMINAL HYSTERECTOMY    . BONE MARROW BIOPSY  06/16/2014   at [facility]     bilateral  . CHOLECYSTECTOMY    . KNEE ARTHROSCOPY    . PARTIAL KNEE ARTHROPLASTY Right 06/18/2018   Procedure: UNICOMPARTMENTAL KNEE;  Surgeon: Marchia Bond, MD;  Location: [facility];  Service: Orthopedics;  Laterality: Right;  . PARTIAL KNEE ARTHROPLASTY Left 09/24/2018   Procedure: UNICOMPARTMENTAL KNEE;  Surgeon: Marchia Bond, MD;  Location: [facility];  Service: Orthopedics;  Laterality: Left;    OB History   No obstetric history on file.      Home Medications    Prior to Admission medications   Medication Sig Start Date End Date Taking? Authorizing Provider  aspirin EC 81 MG tablet Take 81 mg by mouth daily.   Yes [provider]  acyclovir ointment (ZOVIRAX) 5 % Apply 1 application topically daily as needed (for cold sores). 09/03/18   [name], DO  amLODipine (NORVASC) 5 MG tablet Take 1 tablet (5 mg total) by mouth daily. Patient taking differently: Take 5 mg by mouth every evening.  09/03/18   [name], DO  aspirin EC 325 MG tablet Take  1 tablet (325 mg total) by mouth 2 (two) times daily. 09/24/18   Marchia Bond, MD  baclofen (LIORESAL) 10 MG tablet Take 1 tablet (10 mg total) by mouth 3 (three) times daily as needed (muscle spasms). 09/24/18   Marchia Bond, MD  Calcium Citrate-Vitamin D (CALCIUM CITRATE+D3 PO) Take 1 tablet by mouth every evening.    [provider]  cephALEXin (KEFLEX) 500 MG capsule Take 1 capsule (500 mg total) by mouth 2 (two) times daily. 01/19/19   Noe Gens, PA-C  fluticasone (FLOVENT HFA) 110 MCG/ACT inhaler Inhale 2 puffs into the lungs 2 (two) times daily as needed (for shortness of breath or wheezing). 09/03/18   [name], DO  hydrochlorothiazide (HYDRODIURIL) 25 MG tablet Take 1 tablet (25 mg total) by mouth daily. 09/03/18   [name], DO  HYDROcodone-acetaminophen [facility]) 10-325 MG tablet Take 1 tablet by mouth every 6 (six) hours as needed. 09/24/18   Marchia Bond, MD  isosorbide mononitrate (IMDUR) 30 MG 24 hr tablet Take 1 tablet (30 mg total) by mouth daily. 09/03/18   [name], DO  levothyroxine (SYNTHROID) 112 MCG tablet Take 1 tablet (112 mcg total) by mouth daily before breakfast. 12/16/18   [name], DO  losartan (COZAAR) 100 MG tablet Take 1 tablet (100 mg total) by mouth at bedtime. 09/03/18   [name], DO  meclizine (ANTIVERT) 25 MG tablet Take 1 tablet (25 mg total) by mouth 3 (three) times daily as needed for dizziness. 10/10/18   [name], DO  meloxicam (MOBIC) 15 MG tablet Take 15 mg by mouth daily as needed for pain.    [provider]  metoprolol tartrate (LOPRESSOR) 100 MG tablet Take 1 tablet (100 mg total) by mouth 2 (two) times daily. 09/03/18   [name], DO  omeprazole (PRILOSEC) 40 MG capsule Take 1 capsule (40 mg total) by mouth See admin instructions. Take 1 capsule (40 mg) by mouth scheduled in the morning, may repeat dose in the evening as needed for acid reflux/indigestion. 11/26/18    [name], DO  ondansetron (ZOFRAN) 4 MG tablet Take 1 tablet (4 mg total) by mouth every 8 (eight) hours as needed for nausea or vomiting. 09/03/18   [name], DO  pravastatin (PRAVACHOL) 80 MG tablet Take 1 tablet (80 mg total) by mouth at bedtime. 09/03/18   [name], DO  sennosides-docusate sodium (SENOKOT-S) 8.6-50 MG tablet Take 2 tablets by mouth daily. Patient taking differently: Take 2 tablets by mouth daily as needed (constipation.).  06/18/18   Marchia Bond, MD  tetrahydrozoline (VISINE) 0.05 % ophthalmic solution Place 2 drops into both eyes daily as needed (for dry eyes).    [provider]  triamcinolone (NASACORT ALLERGY 24HR) 55 MCG/ACT AERO nasal inhaler  Place 2 sprays into the nose daily. Patient taking differently: Place 2 sprays into the nose daily as needed (allergies.).  09/03/18   [name], DO    Family History Family History  Problem Relation Age of Onset  . Heart disease Other   . Skin cancer Mother   . Stroke Mother   . Prostate cancer Father   . High blood pressure Father   . Heart attack Father     Social History Social History   Tobacco Use  . Smoking status: Never Smoker  . Smokeless tobacco: Never Used  Substance Use Topics  . Alcohol use: No  . Drug use: No     Allergies   Patient has no known allergies.   Review of Systems Review of Systems  Constitutional: Negative for chills and fever.  Gastrointestinal: Negative for nausea and vomiting.  Genitourinary: Positive for urgency. Negative for dysuria, frequency, hematuria and pelvic pain.  Musculoskeletal: Positive for arthralgias, back pain and myalgias. Negative for gait problem and joint swelling.  Neurological: Negative for weakness and numbness.     Physical Exam Triage Vital Signs ED Triage Vitals  Enc Vitals Group     BP 01/19/19 1440 131/68     Pulse Rate 01/19/19 1440 63     Resp 01/19/19 1440 18     Temp 01/19/19 1440 98.4 F  (36.9 C)     Temp Source 01/19/19 1440 Oral     SpO2 01/19/19 1440 96 %     Weight 01/19/19 1441 184 lb (83.5 kg)     Height 01/19/19 1441 _0  (1.651 m)     Head Circumference --      Peak Flow --      Pain Score 01/19/19 1441 8     Pain Loc --      Pain Edu? --      Excl. in [place]? --    No data found.  Updated Vital Signs BP 131/68 (BP Location: Right Arm)   Pulse 63   Temp 98.4 F (36.9 C) (Oral)   Resp 18   Ht _1  (1.651 m)   Wt 184 lb (83.5 kg)   SpO2 96%   BMI 30.62 kg/m   Visual Acuity Right Eye Distance:   Left Eye Distance:   Bilateral Distance:    Right Eye Near:   Left Eye Near:    Bilateral Near:     Physical Exam Vitals signs and nursing note reviewed.  Constitutional:      Appearance: Normal appearance. She is well-developed.  HENT:     Head: Normocephalic and atraumatic.  Neck:     Musculoskeletal: Normal range of motion.  Cardiovascular:     Rate and Rhythm: Normal rate and regular rhythm.  Pulmonary:     Effort: Pulmonary effort is normal. No respiratory distress.  Abdominal:     Palpations: Abdomen is soft.     Tenderness: There is no abdominal tenderness. There is no right CVA tenderness or left CVA tenderness.  Musculoskeletal: Normal range of motion.        General: Tenderness present.       Back:  Skin:    General: Skin is warm and dry.  Neurological:     Mental Status: She is alert and oriented to person, place, and time.  Psychiatric:        Behavior: Behavior normal.      UC Treatments / Results  Labs (all labs ordered are listed, but only abnormal results  are displayed) Labs Reviewed  POCT URINALYSIS DIP (MANUAL ENTRY) - Abnormal; Notable for the following components:      Result Value   Bilirubin, UA small (*)    Ketones, POC UA trace (5) (*)    Leukocytes, UA Small (1+) (*)    All other components within normal limits  URINE CULTURE    EKG   Radiology No results found.  Procedures Procedures (including  critical care time)  Medications Ordered in UC Medications - No data to display  Initial Impression / Assessment and Plan / UC Course  I have reviewed the triage vital signs and the nursing notes.  Pertinent labs & imaging results that were available during my care of the patient were reviewed by me and considered in my medical decision making (see chart for details).     UA c/w UTI Will start pt on keflex, culture pending Home care info provided.  Final Clinical Impressions(s) / UC Diagnoses   Final diagnoses:  Acute right-sided low back pain without sciatica  Acute cystitis with hematuria     Discharge Instructions      Please take your antibiotic as prescribed. A urine culture has been sent to check the severity of your urinary infection and to determine if you are on the most appropriate antibiotic. The results should come back within 2-3 days and you will be notified even if no medication change is needed.  Please stay well hydrated and follow up with your family doctor in 1 week if not improving, sooner if worsening.  You may take your meloxicam (Mobic) and baclofen as prescribed as well as acetaminophen (Tylenol) to help with your back pain.     ED Prescriptions    Medication Sig Dispense Auth. Provider   cephALEXin (KEFLEX) 500 MG capsule Take 1 capsule (500 mg total) by mouth 2 (two) times daily. 14 capsule Noe Gens, PA-C     Controlled Substance Prescriptions [place] Controlled Substance Registry consulted? Not Applicable   Tyrell Antonio 01/19/19 6440

## 2019-01-21 ENCOUNTER — Telehealth: Payer: Self-pay

## 2019-01-21 LAB — URINE CULTURE
MICRO NUMBER:: 659521
SPECIMEN QUALITY:: ADEQUATE

## 2019-01-21 NOTE — Telephone Encounter (Signed)
Left message on VM with contact information and lab results

## 2019-01-29 DIAGNOSIS — M545 Low back pain: Secondary | ICD-10-CM | POA: Diagnosis not present

## 2019-01-29 DIAGNOSIS — M47816 Spondylosis without myelopathy or radiculopathy, lumbar region: Secondary | ICD-10-CM | POA: Diagnosis not present

## 2019-02-05 DIAGNOSIS — Z96652 Presence of left artificial knee joint: Secondary | ICD-10-CM | POA: Diagnosis not present

## 2019-02-07 DIAGNOSIS — M48061 Spinal stenosis, lumbar region without neurogenic claudication: Secondary | ICD-10-CM | POA: Diagnosis not present

## 2019-02-19 ENCOUNTER — Encounter: Payer: Self-pay | Admitting: Osteopathic Medicine

## 2019-02-19 ENCOUNTER — Other Ambulatory Visit: Payer: Self-pay

## 2019-02-19 ENCOUNTER — Ambulatory Visit (INDEPENDENT_AMBULATORY_CARE_PROVIDER_SITE_OTHER): Payer: Medicare Other | Admitting: Osteopathic Medicine

## 2019-02-19 VITALS — BP 132/69 | HR 81 | Temp 98.3°F | Wt 187.4 lb

## 2019-02-19 DIAGNOSIS — M791 Myalgia, unspecified site: Secondary | ICD-10-CM | POA: Diagnosis not present

## 2019-02-19 DIAGNOSIS — I10 Essential (primary) hypertension: Secondary | ICD-10-CM | POA: Diagnosis not present

## 2019-02-19 DIAGNOSIS — E039 Hypothyroidism, unspecified: Secondary | ICD-10-CM

## 2019-02-19 DIAGNOSIS — M545 Low back pain: Secondary | ICD-10-CM | POA: Diagnosis not present

## 2019-02-19 DIAGNOSIS — H9190 Unspecified hearing loss, unspecified ear: Secondary | ICD-10-CM | POA: Diagnosis not present

## 2019-02-19 DIAGNOSIS — Z6830 Body mass index (BMI) 30.0-30.9, adult: Secondary | ICD-10-CM | POA: Diagnosis not present

## 2019-02-19 DIAGNOSIS — M47816 Spondylosis without myelopathy or radiculopathy, lumbar region: Secondary | ICD-10-CM | POA: Diagnosis not present

## 2019-02-19 MED ORDER — PREDNISONE 10 MG PO TABS: 10.0000 mg | ORAL_TABLET | Freq: Every day | ORAL | 0 refills | Status: AC

## 2019-02-19 NOTE — Patient Instructions (Addendum)
Plan:  Symptoms are suspicious for Polymyalgia Rheumatica.  Let's get some blood work and see what this reveals.    Let's try steroid treatment again. Most people will not require steroids permanently but some people might need them for a year or two. Will discuss this further based on test results and symptoms!    I sent Rx for steroids for 2 weeks, please start this regimen after getting blood work done today. Let's plan a virtual visit to follow up around then, as this Rx is running low, can evaluate symptoms at that time. Will of course call sooner with lab results.

## 2019-02-19 NOTE — Progress Notes (Signed)
HPI: [name] is a 84 y.o. female who  has a past medical history of Arthritis, Asthma, Atrial fibrillation ([place]), Chronic sinusitis, Complication of anesthesia, Dyspnea, Family history of adverse reaction to anesthesia, Follicular lymphoma ([place]), Hyperlipidemia, Hypertension, Hypothyroidism, Meniere's disease, PONV (postoperative nausea and vomiting), Primary localized osteoarthritis of left knee (09/24/2018), Primary localized osteoarthritis of right knee (06/18/2018), and Reflux.  she presents to [facility] today, 02/19/19,  for chief complaint of:  Pain  Joint and bone pain  . Context: brought up these concerns to "back specialist." Daughter is worried about autoimmune condition.   . Location: widespread, particularly in legs/hips and lower back, arms  . Quality: soreness, stiffness  . Duration: several months  . Timing: Worse in AM and during longer periods of sitting.   . Modifying factors: toradol and prednisone for back injury helped.  . Assoc signs/symptoms:   Patient is accompanied by daughter who assists with history-taking.    At today's visit 02/19/19 ... PMH, PSH, FH reviewed and updated as needed.  Current medication list and allergy/intolerance hx reviewed and updated as needed. (See remainder of HPI, ROS, Phys Exam below)   No results found.  No results found for this or any previous visit (from the past 72 hour(s)).        ASSESSMENT/PLAN: The primary encounter diagnosis was Myalgia. Diagnoses of Hypothyroidism, unspecified type and Hearing loss, unspecified hearing loss type, unspecified laterality were also pertinent to this visit.   Concerning for polymyalgia rheumatica given the distribution of pain.  Patient would also like referral to [facility] for ENT to discuss possible implant for hearing loss.  Orders Placed This Encounter  Procedures  . Sedimentation rate  . High sensitivity CRP  . ANA  . Rheumatoid  factor  . Cyclic citrul peptide antibody, IgG  . CK  . CBC  . COMPLETE METABOLIC PANEL WITH GFR  . TSH  . Ambulatory referral to ENT     Meds ordered this encounter  Medications  . predniSONE (DELTASONE) 10 MG tablet    Sig: Take 1 tablet (10 mg total) by mouth daily with breakfast for 14 days.    Dispense:  14 tablet    Refill:  0    Patient Instructions  Plan:  Symptoms are suspicious for Polymyalgia Rheumatica.  Let's get some blood work and see what this reveals.    Let's try steroid treatment again. Most people will not require steroids permanently but some people might need them for a year or two. Will discuss this further based on test results and symptoms!    I sent Rx for steroids for 2 weeks, please start this regimen after getting blood work done today. Let's plan a virtual visit to follow up around then, as this Rx is running low, can evaluate symptoms at that time. Will of course call sooner with lab results.       Follow-up plan: Return in 12 days (on 03/03/2019) for RECHECK ON MEDICATION, REVIEW LABS IN DETAIL .                                                 ################################################# ################################################# ################################################# #################################################    Current Meds  Medication Sig  . acyclovir ointment (ZOVIRAX) 5 % Apply 1 application topically daily as needed (for cold sores).  Marland Kitchen amLODipine (  NORVASC) 5 MG tablet Take 1 tablet (5 mg total) by mouth daily. (Patient taking differently: Take 5 mg by mouth every evening. )  . aspirin EC 325 MG tablet Take 1 tablet (325 mg total) by mouth 2 (two) times daily.  Marland Kitchen aspirin EC 81 MG tablet Take 81 mg by mouth daily.  . baclofen (LIORESAL) 10 MG tablet Take 1 tablet (10 mg total) by mouth 3 (three) times daily as needed (muscle spasms).  . Calcium  Citrate-Vitamin D (CALCIUM CITRATE+D3 PO) Take 1 tablet by mouth every evening.  . cephALEXin (KEFLEX) 500 MG capsule Take 1 capsule (500 mg total) by mouth 2 (two) times daily.  . fluticasone (FLOVENT HFA) 110 MCG/ACT inhaler Inhale 2 puffs into the lungs 2 (two) times daily as needed (for shortness of breath or wheezing).  . hydrochlorothiazide (HYDRODIURIL) 25 MG tablet Take 1 tablet (25 mg total) by mouth daily.  Marland Kitchen HYDROcodone-acetaminophen (NORCO) 10-325 MG tablet Take 1 tablet by mouth every 6 (six) hours as needed.  . isosorbide mononitrate (IMDUR) 30 MG 24 hr tablet Take 1 tablet (30 mg total) by mouth daily.  Marland Kitchen levothyroxine (SYNTHROID) 112 MCG tablet Take 1 tablet (112 mcg total) by mouth daily before breakfast.  . losartan (COZAAR) 100 MG tablet Take 1 tablet (100 mg total) by mouth at bedtime.  . meclizine (ANTIVERT) 25 MG tablet Take 1 tablet (25 mg total) by mouth 3 (three) times daily as needed for dizziness.  . meloxicam (MOBIC) 15 MG tablet Take 15 mg by mouth daily as needed for pain.  . metoprolol tartrate (LOPRESSOR) 100 MG tablet Take 1 tablet (100 mg total) by mouth 2 (two) times daily.  Marland Kitchen omeprazole (PRILOSEC) 40 MG capsule Take 1 capsule (40 mg total) by mouth See admin instructions. Take 1 capsule (40 mg) by mouth scheduled in the morning, may repeat dose in the evening as needed for acid reflux/indigestion.  . ondansetron (ZOFRAN) 4 MG tablet Take 1 tablet (4 mg total) by mouth every 8 (eight) hours as needed for nausea or vomiting.  . pravastatin (PRAVACHOL) 80 MG tablet Take 1 tablet (80 mg total) by mouth at bedtime.  . sennosides-docusate sodium (SENOKOT-S) 8.6-50 MG tablet Take 2 tablets by mouth daily. (Patient taking differently: Take 2 tablets by mouth daily as needed (constipation.). )  . tetrahydrozoline (VISINE) 0.05 % ophthalmic solution Place 2 drops into both eyes daily as needed (for dry eyes).  . triamcinolone (NASACORT ALLERGY 24HR) 55 MCG/ACT AERO nasal  inhaler Place 2 sprays into the nose daily. (Patient taking differently: Place 2 sprays into the nose daily as needed (allergies.). )    No Known Allergies     Review of Systems:  Constitutional: No recent illness  HEENT: No  headache, no vision change  Cardiac: No  chest pain, No  pressure, No palpitations  Respiratory:  No  shortness of breath. No  Cough  Gastrointestinal: No  abdominal pain, no change on bowel habits  Musculoskeletal: +new myalgia/arthralgia per HPI  Skin: No  Rash  Hem/Onc: No  easy bruising/bleeding, No  abnormal lumps/bumps  Neurologic: +easily fatigued but no focal weakness, No  Dizziness  Psychiatric: No  concerns with depression, No  concerns with anxiety  Exam:  BP 132/69 (BP Location: Left Arm, Patient Position: Sitting, Cuff Size: Normal)   Pulse 81   Temp 98.3 F (36.8 C) (Oral)   Wt 187 lb 6.4 oz (85 kg)   BMI 31.18 kg/m   Constitutional: VS see above.  General Appearance: alert, well-developed, well-nourished, NAD  Eyes: Normal lids and conjunctive, non-icteric sclera  Neck: No masses, trachea midline.   Respiratory: Normal respiratory effort. no wheeze, no rhonchi, no rales  Cardiovascular: S1/S2 normal, faint systolic murmur, no rub/gallop auscultated. RRR.   Musculoskeletal: Gait normal. Symmetric and independent movement of all extremities.  Normal resisted abduction of arms, hip flexion.  Neurological: Normal balance/coordination. No tremor.  Skin: warm, dry, intact.   Psychiatric: Normal judgment/insight. Normal mood and affect. Oriented x3.       Visit summary with medication list and pertinent instructions was printed for patient to review, patient was advised to alert [place] if any updates are needed. All questions at time of visit were answered - patient instructed to contact office with any additional concerns. ER/RTC precautions were reviewed with the patient and understanding verbalized.     Please note: voice  recognition software was used to produce this document, and typos may escape review. Please contact Dr. Sheppard Coil for any needed clarifications.    Follow up plan: Return in 12 days (on 03/03/2019) for RECHECK ON MEDICATION, REVIEW LABS IN DETAIL .

## 2019-02-21 LAB — COMPLETE METABOLIC PANEL WITH GFR
AG Ratio: 1.7 (calc) (ref 1.0–2.5)
ALT: 20 U/L (ref 6–29)
AST: 18 U/L (ref 10–35)
Albumin: 4 g/dL (ref 3.6–5.1)
Alkaline phosphatase (APISO): 60 U/L (ref 37–153)
BUN/Creatinine Ratio: 13 (calc) (ref 6–22)
BUN: 15 mg/dL (ref 7–25)
CO2: 30 mmol/L (ref 20–32)
Calcium: 9.8 mg/dL (ref 8.6–10.4)
Chloride: 101 mmol/L (ref 98–110)
Creat: 1.14 mg/dL — ABNORMAL HIGH (ref 0.60–0.88)
GFR, Est African American: 52 mL/min/{1.73_m2} — ABNORMAL LOW (ref >=60)
GFR, Est Non African American: 45 mL/min/{1.73_m2} — ABNORMAL LOW (ref >=60)
Globulin: 2.4 g/dL (calc) (ref 1.9–3.7)
Glucose, Bld: 108 mg/dL — ABNORMAL HIGH (ref 65–99)
Potassium: 4.4 mmol/L (ref 3.5–5.3)
Sodium: 139 mmol/L (ref 135–146)
Total Bilirubin: 0.6 mg/dL (ref 0.2–1.2)
Total Protein: 6.4 g/dL (ref 6.1–8.1)

## 2019-02-21 LAB — SEDIMENTATION RATE: Sed Rate: 9 mm/h (ref 0–30)

## 2019-02-21 LAB — HIGH SENSITIVITY CRP: hs-CRP: 1.9 mg/L

## 2019-02-21 LAB — CBC
HCT: 38.5 % (ref 35.0–45.0)
Hemoglobin: 12.9 g/dL (ref 11.7–15.5)
MCH: 29.2 pg (ref 27.0–33.0)
MCHC: 33.5 g/dL (ref 32.0–36.0)
MCV: 87.1 fL (ref 80.0–100.0)
MPV: 10.3 fL (ref 7.5–12.5)
Platelets: 271 10*3/uL (ref 140–400)
RBC: 4.42 10*6/uL (ref 3.80–5.10)
RDW: 12.8 % (ref 11.0–15.0)
WBC: 5.4 10*3/uL (ref 3.8–10.8)

## 2019-02-21 LAB — ANA: Anti Nuclear Antibody (ANA): POSITIVE — AB

## 2019-02-21 LAB — ANTI-NUCLEAR AB-TITER (ANA TITER): ANA Titer 1: 1:40 {titer} — ABNORMAL HIGH

## 2019-02-21 LAB — TSH: TSH: 0.76 mIU/L (ref 0.40–4.50)

## 2019-02-21 LAB — CK: Total CK: 99 U/L (ref 29–143)

## 2019-02-21 LAB — CYCLIC CITRUL PEPTIDE ANTIBODY, IGG: Cyclic Citrullin Peptide Ab: <16 UNITS

## 2019-02-21 LAB — RHEUMATOID FACTOR: Rheumatoid fact SerPl-aCnc: <14 IU/mL (ref <14)

## 2019-02-26 DIAGNOSIS — Z7901 Long term (current) use of anticoagulants: Secondary | ICD-10-CM | POA: Diagnosis not present

## 2019-02-26 DIAGNOSIS — I480 Paroxysmal atrial fibrillation: Secondary | ICD-10-CM | POA: Diagnosis not present

## 2019-02-26 DIAGNOSIS — I4510 Unspecified right bundle-branch block: Secondary | ICD-10-CM | POA: Diagnosis not present

## 2019-02-26 DIAGNOSIS — I454 Nonspecific intraventricular block: Secondary | ICD-10-CM | POA: Diagnosis not present

## 2019-02-26 DIAGNOSIS — I493 Ventricular premature depolarization: Secondary | ICD-10-CM | POA: Diagnosis not present

## 2019-02-26 DIAGNOSIS — R9431 Abnormal electrocardiogram [ECG] [EKG]: Secondary | ICD-10-CM | POA: Diagnosis not present

## 2019-02-27 DIAGNOSIS — I480 Paroxysmal atrial fibrillation: Secondary | ICD-10-CM | POA: Diagnosis not present

## 2019-03-03 ENCOUNTER — Encounter: Payer: Self-pay | Admitting: Osteopathic Medicine

## 2019-03-03 ENCOUNTER — Ambulatory Visit (INDEPENDENT_AMBULATORY_CARE_PROVIDER_SITE_OTHER): Payer: Medicare Other | Admitting: Osteopathic Medicine

## 2019-03-03 ENCOUNTER — Other Ambulatory Visit: Payer: Self-pay

## 2019-03-03 ENCOUNTER — Ambulatory Visit (INDEPENDENT_AMBULATORY_CARE_PROVIDER_SITE_OTHER): Payer: Medicare Other

## 2019-03-03 VITALS — BP 149/60 | HR 74 | Temp 98.0°F | Wt 188.5 lb

## 2019-03-03 DIAGNOSIS — R768 Other specified abnormal immunological findings in serum: Secondary | ICD-10-CM

## 2019-03-03 DIAGNOSIS — M791 Myalgia, unspecified site: Secondary | ICD-10-CM | POA: Diagnosis not present

## 2019-03-03 DIAGNOSIS — R0781 Pleurodynia: Secondary | ICD-10-CM

## 2019-03-03 NOTE — Patient Instructions (Signed)
Will refer to rheumatology Will get Xray today Start taking 1/2 dose steroids (5 mg) until you're out then can stop If feeling fine a week of steroids, can cancel the rheumatology appt If still having pain or if worse , will get back on steroids and see rheumatology

## 2019-03-03 NOTE — Progress Notes (Signed)
HPI: [name] is a 84 y.o. female who  has a past medical history of Arthritis, Asthma, Atrial fibrillation ([place]), Chronic sinusitis, Complication of anesthesia, Dyspnea, Family history of adverse reaction to anesthesia, Follicular lymphoma ([place]), Hyperlipidemia, Hypertension, Hypothyroidism, Meniere's disease, PONV (postoperative nausea and vomiting), Primary localized osteoarthritis of left knee (09/24/2018), Primary localized osteoarthritis of right knee (06/18/2018), and Reflux.  she presents to [facility] today, 03/03/19,  for chief complaint of:  Follow up myalgia.  Problem: Rib pain  Feels she is doing a bit better on the steroids, we reviewed labs nothing is really meeting diagnosis for polymyalgia rheumatica, inflammatory markers are all negative, though patient was recently on steroids prior to blood draw, she had been off of them for at least a week or so before blood was drawn and was experiencing symptoms of muscle pain and fatigue in upper arms/shoulders, bilateral hips.  ANA was positive but not dramatically so.  Reports some pain/tenderness on the right posterior lower rib cage, tender to the touch, hurts when she lies down on this area.  No injury.  Occasional dry cough, she cannot really connect it with this.  Area seems to hurt worse with deep breaths in.  No substernal chest pain, no abdominal pain, no midline back pain   At today's visit 03/03/19 ... PMH, PSH, FH reviewed and updated as needed.  Current medication list and allergy/intolerance hx reviewed and updated as needed. (See remainder of HPI, ROS, Phys Exam below)  Recent Results (from the past 2160 hour(s))  TSH     Status: None   Collection Time: 12/10/18  1:46 PM  Result Value Ref Range   TSH 1.81 0.40 - 4.50 mIU/L  POCT urinalysis dipstick     Status: Abnormal   Collection Time: 01/19/19  3:17 PM  Result Value Ref Range   Color, UA yellow yellow   Clarity, UA clear  clear   Glucose, UA negative negative mg/dL   Bilirubin, UA small (A) negative   Ketones, POC UA trace (5) (A) negative mg/dL   Spec Grav, UA 1.025 1.010 - 1.025   Blood, UA negative negative   pH, UA 5.5 5.0 - 8.0   Protein Ur, POC negative negative mg/dL   Urobilinogen, UA 0.2 0.2 or 1.0 E.U./dL   Nitrite, UA Negative Negative   Leukocytes, UA Small (1+) (A) Negative  Urine culture     Status: None   Collection Time: 01/19/19  3:18 PM   Specimen: Urine, Clean Catch  Result Value Ref Range   MICRO NUMBER: [id number]    SPECIMEN QUALITY: Adequate    Sample Source NOT GIVEN    STATUS: FINAL    Result:      Three or more organisms present, each greater than 10,000 CFU/mL. May represent normal flora contamination from external genitalia. No further testing is required.  Sedimentation rate     Status: None   Collection Time: 02/19/19  3:20 PM  Result Value Ref Range   Sed Rate 9 0 - 30 mm/h  High sensitivity CRP     Status: None   Collection Time: 02/19/19  3:20 PM  Result Value Ref Range   hs-CRP 1.9 mg/L    Comment: Reference Range Optimal <1.0 Jellinger PS et al. [facility] [place] Pract.2017;23(Suppl 2):1-87. . For ages >59 Years: hs-CRP mg/L  Risk According to AHA/CDC Guidelines <1.0         Lower relative cardiovascular risk. 1.0-3.0      Average  relative cardiovascular risk. 3.1-10.0     Higher relative cardiovascular risk.              Consider retesting in 1 to 2 weeks to              exclude a benign transient elevation              in the baseline CRP value secondary              to infection or inflammation. >10.0        Persistent elevation, upon retesting,              may be associated with infection and              inflammation. .   ANA     Status: Abnormal   Collection Time: 02/19/19  3:20 PM  Result Value Ref Range   Anti Nuclear Antibody (ANA) POSITIVE (A) NEGATIVE    Comment: ANA IFA is a first line screen for detecting the presence of up to approximately 150  autoantibodies in various autoimmune diseases. A positive ANA IFA result is suggestive of autoimmune disease and reflexes to titer and pattern. Further laboratory testing may be considered if clinically indicated. . For additional information, please refer to http://education.[facility].com/faq/FAQ177 (This link is being provided for informational/ educational purposes only.) .   Rheumatoid factor     Status: None   Collection Time: 02/19/19  3:20 PM  Result Value Ref Range   Rhuematoid fact SerPl-aCnc 99991111 [id number] IU/mL  Cyclic citrul peptide antibody, IgG     Status: None   Collection Time: 02/19/19  3:20 PM  Result Value Ref Range   Cyclic Citrullin Peptide Ab <16 UNITS    Comment: Reference Range Negative:            <20 Weak Positive:       20-39 Moderate Positive:   40-59 Strong Positive:     >59 .   CK     Status: None   Collection Time: 02/19/19  3:20 PM  Result Value Ref Range   Total CK 99 29 - 143 U/L  CBC     Status: None   Collection Time: 02/19/19  3:20 PM  Result Value Ref Range   WBC 5.4 3.8 - 10.8 Thousand/uL   RBC 4.42 3.80 - 5.10 Million/uL   Hemoglobin 12.9 11.7 - 15.5 g/dL   HCT 38.5 35.0 - 45.0 %   MCV 87.1 80.0 - 100.0 fL   MCH 29.2 27.0 - 33.0 pg   MCHC 33.5 32.0 - 36.0 g/dL   RDW 12.8 11.0 - 15.0 %   Platelets 271 140 - 400 Thousand/uL   MPV 10.3 7.5 - 12.5 fL  COMPLETE METABOLIC PANEL WITH GFR     Status: Abnormal   Collection Time: 02/19/19  3:20 PM  Result Value Ref Range   Glucose, Bld 108 (H) 65 - 99 mg/dL    Comment: .            Fasting reference interval . For someone without known diabetes, a glucose value between 100 and 125 mg/dL is consistent with prediabetes and should be confirmed with a follow-up test. .    BUN 15 7 - 25 mg/dL   Creat 1.14 (H) 0.60 - 0.88 mg/dL    Comment: For patients >69 years of age, the reference limit for Creatinine is approximately 13% higher for people identified as African-American. .     GFR,  Est Non African American 45 (L) > OR = 60 mL/min/1.26m2   GFR, Est African American 52 (L) > OR = 60 mL/min/1.77m2   BUN/Creatinine Ratio 13 6 - 22 (calc)   Sodium 139 135 - 146 mmol/L   Potassium 4.4 3.5 - 5.3 mmol/L   Chloride 101 98 - 110 mmol/L   CO2 30 20 - 32 mmol/L   Calcium 9.8 8.6 - 10.4 mg/dL   Total Protein 6.4 6.1 - 8.1 g/dL   Albumin 4.0 3.6 - 5.1 g/dL   Globulin 2.4 1.9 - 3.7 g/dL (calc)   AG Ratio 1.7 1.0 - 2.5 (calc)   Total Bilirubin 0.6 0.2 - 1.2 mg/dL   Alkaline phosphatase (APISO) 60 37 - 153 U/L   AST 18 10 - 35 U/L   ALT 20 6 - 29 U/L  TSH     Status: None   Collection Time: 02/19/19  3:20 PM  Result Value Ref Range   TSH 0.76 0.40 - 4.50 mIU/L  Anti-nuclear ab-titer (ANA titer)     Status: Abnormal   Collection Time: 02/19/19  3:20 PM  Result Value Ref Range   ANA Titer 1 1:40 (H) titer    Comment: A low level ANA titer may be present in pre-clinical autoimmune diseases and normal individuals.                 Reference Range                 <1:40        Negative                 1:40-1:80    Low Antibody Level                 >1:80        Elevated Antibody Level .    ANA Pattern 1 Nuclear, Speckled (A)     Comment: Speckled pattern is associated with mixed connective tissue disease (MCTD), systemic lupus erythematosus (SLE), Sjogren's syndrome, dermatomyositis, and  systemic sclerosis/polymyositis overlap. . AC-2,4,5,29: Speckled . International Consensus on ANA Patterns (https://www.hernandez-brewer.[email or web address]/)         ASSESSMENT/PLAN: The primary encounter diagnosis was Rib pain on right side. Diagnoses of Myalgia and Positive ANA (antinuclear antibody) were also pertinent to this visit.   Will refer to rheumatology for second opinion.  Patient would like to come off of the steroids and see if symptoms recur.  If she feels fine off the steroids she says she will go ahead and cancel the rheumatology appointment, I think this is  reasonable.  Orders Placed This Encounter  Procedures  . DG Chest 2 View  . DG Ribs Unilateral Right  . Ambulatory referral to Rheumatology     No orders of the defined types were placed in this encounter.   Patient Instructions  Will refer to rheumatology Will get Xray today Start taking 1/2 dose steroids (5 mg) until you're out then can stop If feeling fine a week of steroids, can cancel the rheumatology appt If still having pain or if worse , will get back on steroids and see rheumatology        Follow-up plan: Return for RECHECK PENDING RESULTS / IF WORSE OR CHANGE.                                                 ################################################# ################################################# ################################################# #################################################  Current Meds  Medication Sig  . acyclovir ointment (ZOVIRAX) 5 % Apply 1 application topically daily as needed (for cold sores).  Marland Kitchen amLODipine (NORVASC) 5 MG tablet Take 1 tablet (5 mg total) by mouth daily. (Patient taking differently: Take 5 mg by mouth every evening. )  . aspirin EC 325 MG tablet Take 1 tablet (325 mg total) by mouth 2 (two) times daily.  Marland Kitchen aspirin EC 81 MG tablet Take 81 mg by mouth daily.  . baclofen (LIORESAL) 10 MG tablet Take 1 tablet (10 mg total) by mouth 3 (three) times daily as needed (muscle spasms).  . Calcium Citrate-Vitamin D (CALCIUM CITRATE+D3 PO) Take 1 tablet by mouth every evening.  . cephALEXin (KEFLEX) 500 MG capsule Take 1 capsule (500 mg total) by mouth 2 (two) times daily.  . fluticasone (FLOVENT HFA) 110 MCG/ACT inhaler Inhale 2 puffs into the lungs 2 (two) times daily as needed (for shortness of breath or wheezing).  . hydrochlorothiazide (HYDRODIURIL) 25 MG tablet Take 1 tablet (25 mg total) by mouth daily.  Marland Kitchen HYDROcodone-acetaminophen (NORCO) 10-325 MG tablet Take 1  tablet by mouth every 6 (six) hours as needed.  . isosorbide mononitrate (IMDUR) 30 MG 24 hr tablet Take 1 tablet (30 mg total) by mouth daily.  Marland Kitchen levothyroxine (SYNTHROID) 112 MCG tablet Take 1 tablet (112 mcg total) by mouth daily before breakfast.  . losartan (COZAAR) 100 MG tablet Take 1 tablet (100 mg total) by mouth at bedtime.  . meclizine (ANTIVERT) 25 MG tablet Take 1 tablet (25 mg total) by mouth 3 (three) times daily as needed for dizziness.  . meloxicam (MOBIC) 15 MG tablet Take 15 mg by mouth daily as needed for pain.  . metoprolol tartrate (LOPRESSOR) 100 MG tablet Take 1 tablet (100 mg total) by mouth 2 (two) times daily.  Marland Kitchen omeprazole (PRILOSEC) 40 MG capsule Take 1 capsule (40 mg total) by mouth See admin instructions. Take 1 capsule (40 mg) by mouth scheduled in the morning, may repeat dose in the evening as needed for acid reflux/indigestion.  . ondansetron (ZOFRAN) 4 MG tablet Take 1 tablet (4 mg total) by mouth every 8 (eight) hours as needed for nausea or vomiting.  . pravastatin (PRAVACHOL) 80 MG tablet Take 1 tablet (80 mg total) by mouth at bedtime.  . predniSONE (DELTASONE) 10 MG tablet Take 1 tablet (10 mg total) by mouth daily with breakfast for 14 days.  . sennosides-docusate sodium (SENOKOT-S) 8.6-50 MG tablet Take 2 tablets by mouth daily. (Patient taking differently: Take 2 tablets by mouth daily as needed (constipation.). )  . tetrahydrozoline (VISINE) 0.05 % ophthalmic solution Place 2 drops into both eyes daily as needed (for dry eyes).  . triamcinolone (NASACORT ALLERGY 24HR) 55 MCG/ACT AERO nasal inhaler Place 2 sprays into the nose daily. (Patient taking differently: Place 2 sprays into the nose daily as needed (allergies.). )    No Known Allergies     Review of Systems:  Constitutional: No recent illness  HEENT: No  headache, no vision change  Cardiac: No  chest pain, No  pressure, No palpitations  Respiratory:  No  shortness of breath. +occasional  Cough  Gastrointestinal: No  abdominal pain, no change on bowel habits  Musculoskeletal: +new myalgia/arthralgia  Skin: No  Rash  Neurologic: No  weakness, No  Dizziness  Psychiatric: No  concerns with depression, No  concerns with anxiety  Exam:  BP (!) 149/60 (BP Location: Left Arm, Patient Position: Sitting, Cuff Size: Normal)  Pulse 74   Temp 98 F (36.7 C) (Oral)   Wt 188 lb 8 oz (85.5 kg)   BMI 31.37 kg/m   Constitutional: VS see above. General Appearance: alert, well-developed, well-nourished, NAD  Eyes: Normal lids and conjunctive, non-icteric sclera  Neck: No masses, trachea midline.   Respiratory: Normal respiratory effort. no wheeze, no rhonchi, no rales  Cardiovascular: S1/S2 normal, no murmur, no rub/gallop auscultated. RRR.   Musculoskeletal: Gait normal. Symmetric and independent movement of all extremities, tender point posterior ribs around level of T10 on the right  Abdominal: non-tender, non-distended, no appreciable organomegaly, neg Murphy's, BS [id number]  Neurological: Normal balance/coordination. No tremor.  Skin: warm, dry, intact.   Psychiatric: Normal judgment/insight. Normal mood and affect. Oriented x3.       Visit summary with medication list and pertinent instructions was printed for patient to review, patient was advised to alert [place] if any updates are needed. All questions at time of visit were answered - patient instructed to contact office with any additional concerns. ER/RTC precautions were reviewed with the patient and understanding verbalized.   Note: Total time spent 25 minutes, greater than 50% of the visit was spent face-to-face counseling and coordinating care for the following: The primary encounter diagnosis was Rib pain on right side. Diagnoses of Myalgia and Positive ANA (antinuclear antibody) were also pertinent to this visit.Marland Kitchen  Please note: voice recognition software was used to produce this document, and typos may escape  review. Please contact Dr. Sheppard Coil for any needed clarifications.    Follow up plan: Return for RECHECK PENDING RESULTS / IF WORSE OR CHANGE.

## 2019-03-10 ENCOUNTER — Other Ambulatory Visit: Payer: Self-pay

## 2019-03-10 ENCOUNTER — Ambulatory Visit (INDEPENDENT_AMBULATORY_CARE_PROVIDER_SITE_OTHER): Payer: Medicare Other | Admitting: *Deleted

## 2019-03-10 VITALS — BP 139/57 | HR 68 | Temp 98.8°F | Ht 65.0 in | Wt 188.0 lb

## 2019-03-10 DIAGNOSIS — Z780 Asymptomatic menopausal state: Secondary | ICD-10-CM

## 2019-03-10 DIAGNOSIS — Z13820 Encounter for screening for osteoporosis: Secondary | ICD-10-CM | POA: Diagnosis not present

## 2019-03-10 DIAGNOSIS — Z0000 Encounter for general adult medical examination without abnormal findings: Secondary | ICD-10-CM

## 2019-03-10 MED ORDER — BACLOFEN 10 MG PO TABS: 10.0000 mg | ORAL_TABLET | Freq: Three times a day (TID) | ORAL | 0 refills | Status: DC | PRN

## 2019-03-10 NOTE — Progress Notes (Signed)
Subjective:   [name] is a 84 y.o. female who presents for an Initial Medicare Annual Wellness Visit.  Review of Systems    No ROS.  Medicare Wellness Virtual Visit.  Visual/audio telehealth visit, UTA vital signs.   See social history for additional risk factors.     Cardiac Risk Factors include: advanced age (>56mn, >>3women);hypertension;sedentary lifestyle Sleep patterns:  Getting 5 hours of sleep a night.. Wakes up 2-3 times a night to void. Wakes up feels sluggish.  Home Safety/Smoke Alarms: Feels safe in home. Smoke alarms in place.  Living environment; Lives with daughter in a 2 story home. Stairs have handrails on them. Shower walk in shower with grab bars in place. Seat Belt Safety/Bike Helmet: Wears seat belt.   Female:   Pap-  Aged out     Mammo-  Aged out     Dexa scan-    ordered    CCS- aged out     Objective:    Today's Vitals   03/10/19 0858  BP: (!) 139/57  Pulse: 68  Temp: 98.8 F (37.1 C)  TempSrc: Oral  SpO2: 98%  Weight: 188 lb (85.3 kg)  PainSc: 6    Body mass index is 31.28 kg/m.  Advanced Directives 03/10/2019 09/30/2018 09/24/2018 09/13/2018 06/24/2018 06/18/2018 06/18/2018  Does Patient Have a Medical Advance Directive? No No No No No No No  Would patient like information on creating a medical advance directive? No - Patient declined No - Patient declined No - Patient declined No - Patient declined No - Patient declined No - Patient declined No - Patient declined    Current Medications (verified) Outpatient Encounter Medications as of 03/10/2019  Medication Sig  . acyclovir ointment (ZOVIRAX) 5 % Apply 1 application topically daily as needed (for cold sores).  .Marland KitchenamLODipine (NORVASC) 5 MG tablet Take 1 tablet (5 mg total) by mouth daily. (Patient taking differently: Take 5 mg by mouth every evening. )  . aspirin EC 81 MG tablet Take 81 mg by mouth daily.  . baclofen (LIORESAL) 10 MG tablet Take 1 tablet (10 mg total) by mouth 3 (three)  times daily as needed (muscle spasms).  . Calcium Citrate-Vitamin D (CALCIUM CITRATE+D3 PO) Take 1 tablet by mouth every evening.  . fluticasone (FLOVENT HFA) 110 MCG/ACT inhaler Inhale 2 puffs into the lungs 2 (two) times daily as needed (for shortness of breath or wheezing).  . hydrochlorothiazide (HYDRODIURIL) 25 MG tablet Take 1 tablet (25 mg total) by mouth daily.  .Marland KitchenHYDROcodone-acetaminophen (NORCO) 10-325 MG tablet Take 1 tablet by mouth every 6 (six) hours as needed.  . isosorbide mononitrate (IMDUR) 30 MG 24 hr tablet Take 1 tablet (30 mg total) by mouth daily.  .Marland Kitchenlevothyroxine (SYNTHROID) 112 MCG tablet Take 1 tablet (112 mcg total) by mouth daily before breakfast.  . losartan (COZAAR) 100 MG tablet Take 1 tablet (100 mg total) by mouth at bedtime.  . meclizine (ANTIVERT) 25 MG tablet Take 1 tablet (25 mg total) by mouth 3 (three) times daily as needed for dizziness.  . meloxicam (MOBIC) 15 MG tablet Take 15 mg by mouth daily as needed for pain.  . metoprolol tartrate (LOPRESSOR) 100 MG tablet Take 1 tablet (100 mg total) by mouth 2 (two) times daily.  .Marland Kitchenomeprazole (PRILOSEC) 40 MG capsule Take 1 capsule (40 mg total) by mouth See admin instructions. Take 1 capsule (40 mg) by mouth scheduled in the morning, may repeat dose in the evening as  needed for acid reflux/indigestion.  . ondansetron (ZOFRAN) 4 MG tablet Take 1 tablet (4 mg total) by mouth every 8 (eight) hours as needed for nausea or vomiting.  . pravastatin (PRAVACHOL) 80 MG tablet Take 1 tablet (80 mg total) by mouth at bedtime.  . sennosides-docusate sodium (SENOKOT-S) 8.6-50 MG tablet Take 2 tablets by mouth daily. (Patient taking differently: Take 2 tablets by mouth daily as needed (constipation.). )  . tetrahydrozoline (VISINE) 0.05 % ophthalmic solution Place 2 drops into both eyes daily as needed (for dry eyes).  . triamcinolone (NASACORT ALLERGY 24HR) 55 MCG/ACT AERO nasal inhaler Place 2 sprays into the nose daily.  (Patient taking differently: Place 2 sprays into the nose daily as needed (allergies.). )  . aspirin EC 325 MG tablet Take 1 tablet (325 mg total) by mouth 2 (two) times daily. (Patient not taking: Reported on 03/10/2019)  . [DISCONTINUED] cephALEXin (KEFLEX) 500 MG capsule Take 1 capsule (500 mg total) by mouth 2 (two) times daily.   No facility-administered encounter medications on file as of 03/10/2019.     Allergies (verified) Patient has no known allergies.   History: Past Medical History:  Diagnosis Date  . Arthritis   . Asthma   . Atrial fibrillation (HCC)    paroxysmal , addressed  with metoprolol   . Chronic sinusitis   . Complication of anesthesia   . Dyspnea    sob on exertion, reports at her pre-op today 09-13-2018 that her cardiologist has planned for her to do a ECHO for evaluation   . Family history of adverse reaction to anesthesia    sister is hard to wake up from Anesthesia  . Follicular lymphoma ([place])    received Chemotherapy (2 years ago)  and radiation (last 12/2017)  . Hyperlipidemia   . Hypertension   . Hypothyroidism   . Meniere's disease   . PONV (postoperative nausea and vomiting)   . Primary localized osteoarthritis of left knee 09/24/2018  . Primary localized osteoarthritis of right knee 06/18/2018  . Reflux    Past Surgical History:  Procedure Laterality Date  . ABDOMINAL HYSTERECTOMY    . BONE MARROW BIOPSY  06/16/2014   at [facility]     bilateral  . CHOLECYSTECTOMY    . KNEE ARTHROSCOPY    . PARTIAL KNEE ARTHROPLASTY Right 06/18/2018   Procedure: UNICOMPARTMENTAL KNEE;  Surgeon: Marchia Bond, MD;  Location: [facility];  Service: Orthopedics;  Laterality: Right;  . PARTIAL KNEE ARTHROPLASTY Left 09/24/2018   Procedure: UNICOMPARTMENTAL KNEE;  Surgeon: Marchia Bond, MD;  Location: [facility];  Service: Orthopedics;  Laterality: Left;   Family History  Problem Relation Age of Onset  . Heart disease Other   . Skin cancer  Mother   . Stroke Mother   . Prostate cancer Father   . High blood pressure Father   . Heart attack Father    Social History   Socioeconomic History  . Marital status: Widowed    Spouse name: Not on file  . Number of children: 1  . Years of education: 24  . Highest education level: 12th grade  Occupational History  . 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 Financial resource strain: Not hard at all  . Food insecurity    Worry: Never true    Inability: Never true  . Transportation needs    Medical: No    Non-medical: No  Tobacco Use  . Smoking  status: Never Smoker  . Smokeless tobacco: Never Used  Substance and Sexual Activity  . Alcohol use: No  . Drug use: No  . Sexual activity: Not Currently    Partners: Male  Lifestyle  . Physical activity    Days per week: 0 days    Minutes per session: 0 min  . Stress: Not at all  Relationships  . Social Herbalist on phone: Once a week    Gets together: Once a week    Attends religious service: 1 to 4 times per year    Active member of club or organization: No    Attends meetings of clubs or organizations: Never    Relationship status: Widowed  Other Topics Concern  . Not on file  Social History Narrative  . Not on file    Tobacco Counseling Counseling given: Not Answered   Clinical Intake:  Pre-visit preparation completed: Yes  Pain : 0-10 Pain Score: 6  Pain Type: Chronic pain Pain Location: Back Pain Orientation: Posterior Pain Descriptors / Indicators: Aching, Throbbing Pain Onset: 1 to 4 weeks ago Pain Frequency: Intermittent Pain Relieving Factors: pain meds Effect of Pain on Daily Activities: none  Pain Relieving Factors: pain meds  Nutritional Risks: None Diabetes: No  How often do you need to have someone help you when you read instructions, pamphlets, or other written materials from your doctor or pharmacy?: 1 - Never What is the last grade level you completed  in school?: 12  Interpreter Needed?: No  Information entered by :: Orlie Dakin, LPN   Activities of Daily Living In your present state of health, do you have any difficulty performing the following activities: 03/10/2019 09/24/2018  Hearing? Tempie Donning  Comment has appointment today with hearing doctor -  Vision? N N  Difficulty concentrating or making decisions? N N  Walking or climbing stairs? N N  Dressing or bathing? N N  Doing errands, shopping? N N  Preparing Food and eating ? N -  Using the Toilet? N -  In the past six months, have you accidently leaked urine? N -  Comment has bladder sling in place -  Do you have problems with loss of bowel control? N -  Managing your Medications? N -  Managing your Finances? N -  Housekeeping or managing your Housekeeping? N -  Some recent data might be hidden     Immunizations and Health Maintenance Immunization History  Administered Date(s) Administered  . Influenza-Unspecified 04/23/2017, 05/10/2018, 08/04/2018   Health Maintenance Due  Topic Date Due  . DEXA SCAN  12/22/2001  . PNA vac Low Risk Adult (1 of 2 - PCV13) 12/22/2001    Patient Care Team: [name], DO as PCP - General (Osteopathic Medicine)  Indicate any recent Medical Services you may have received from other than [facility] providers in the past year (date may be approximate).     Assessment:   This is a routine wellness examination for Lester.Physical assessment deferred to PCP.   Hearing/Vision screen  Hearing Screening   '125Hz'  '250Hz'  '500Hz'  '1000Hz'  '2000Hz'  '3000Hz'  '4000Hz'  '6000Hz'  '8000Hz'   Right ear:           Left ear:           Comments: Hearing test not done due to mask on and patient could not here through the mask. KG LPN   Visual Acuity Screening   Right eye Left eye Both eyes  Without correction:     With correction:  '20/40 20/30 20/20 '    Dietary issues and exercise activities discussed: Current Exercise Habits: The patient does not participate in  regular exercise at present, Exercise limited by: None identified Diet eats pretty healthy with fruits and vegetables. Does state she will eat junk food at times Breakfast: toast and coffee. Yogurt Lunch: salad Dinner:  Meat and vegetables  Drinks water daily   Goals    . Weight (lb) < 200 lb (90.7 kg)     Would like to loose 20 pounds      Depression Screen PHQ 2/9 Scores 03/10/2019 03/03/2019 09/03/2018  PHQ - 2 Score 0 - 0  PHQ- 9 Score - - 6  Exception Documentation - Patient refusal -    Fall Risk Fall Risk  03/10/2019  Falls in the past year? 0  Follow up Falls prevention discussed    Is the patient's home free of loose throw rugs in walkways, pet beds, electrical cords, etc?   yes      Grab bars in the bathroom? yes      Handrails on the stairs?   yes      Adequate lighting?   yes  Cognitive Function:     6CIT Screen 03/10/2019  What Year? 0 points  What month? 0 points  What time? 0 points  Count back from 20 0 points  Months in reverse 0 points  Repeat phrase 0 points  Total Score 0    Screening Tests Health Maintenance  Topic Date Due  . DEXA SCAN  12/22/2001  . PNA vac Low Risk Adult (1 of 2 - PCV13) 12/22/2001  . INFLUENZA VACCINE  04/02/2019 (Originally 02/08/2019)  . TETANUS/TDAP  09/04/2019 (Originally 12/23/1953)        Plan:      Ms. Ripp , Thank you for taking time to come for your Medicare Wellness Visit. I appreciate your ongoing commitment to your health goals. Please review the following plan we discussed and let me know if I can assist you in the future. Please schedule your next medicare wellness visit with me in 1 yr. Continue doing brain stimulating activities (puzzles, reading, adult coloring books, staying active) to keep memory sharp.     These are the goals we discussed: Goals    . Weight (lb) < 200 lb (90.7 kg)     Would like to loose 20 pounds       This is a list of the screening recommended for you and due dates:  Health  Maintenance  Topic Date Due  . DEXA scan (bone density measurement)  12/22/2001  . Pneumonia vaccines (1 of 2 - PCV13) 12/22/2001  . Flu Shot  04/02/2019*  . Tetanus Vaccine  09/04/2019*  *Topic was postponed. The date shown is not the original due date.     I have personally reviewed and noted the following in the patient's chart:   . Medical and social history . Use of alcohol, tobacco or illicit drugs  . Current medications and supplements . Functional ability and status . Nutritional status . Physical activity . Advanced directives . List of other physicians . Hospitalizations, surgeries, and ER visits in previous 12 months . Vitals . Screenings to include cognitive, depression, and falls . Referrals and appointments  In addition, I have reviewed and discussed with patient certain preventive protocols, quality metrics, and best practice recommendations. A written personalized care plan for preventive services as well as general preventive health recommendations were provided to patient.  Joanne Chars, LPN   [id number]

## 2019-03-10 NOTE — Patient Instructions (Addendum)
[name] , Thank you for taking time to come for your Medicare Wellness Visit. I appreciate your ongoing commitment to your health goals. Please review the following plan we discussed and let me know if I can assist you in the future. Please schedule your next medicare wellness visit with me in 1 yr. Continue doing brain stimulating activities (puzzles, reading, adult coloring books, staying active) to keep memory sharp.  These are the goals we discussed: Goals    . Weight (lb) < 200 lb (90.7 kg)     Would like to loose 20 pounds

## 2019-03-13 DIAGNOSIS — C8290 Follicular lymphoma, unspecified, unspecified site: Secondary | ICD-10-CM | POA: Diagnosis not present

## 2019-03-13 DIAGNOSIS — C8232 Follicular lymphoma grade IIIa, intrathoracic lymph nodes: Secondary | ICD-10-CM | POA: Diagnosis not present

## 2019-03-20 DIAGNOSIS — I480 Paroxysmal atrial fibrillation: Secondary | ICD-10-CM | POA: Diagnosis not present

## 2019-03-22 DIAGNOSIS — I440 Atrioventricular block, first degree: Secondary | ICD-10-CM | POA: Diagnosis not present

## 2019-03-22 DIAGNOSIS — I472 Ventricular tachycardia: Secondary | ICD-10-CM | POA: Diagnosis not present

## 2019-03-22 DIAGNOSIS — I493 Ventricular premature depolarization: Secondary | ICD-10-CM | POA: Diagnosis not present

## 2019-03-22 DIAGNOSIS — I471 Supraventricular tachycardia: Secondary | ICD-10-CM | POA: Diagnosis not present

## 2019-03-24 DIAGNOSIS — H903 Sensorineural hearing loss, bilateral: Secondary | ICD-10-CM | POA: Diagnosis not present

## 2019-03-24 DIAGNOSIS — H838X3 Other specified diseases of inner ear, bilateral: Secondary | ICD-10-CM | POA: Diagnosis not present

## 2019-04-02 DIAGNOSIS — Z96652 Presence of left artificial knee joint: Secondary | ICD-10-CM | POA: Diagnosis not present

## 2019-04-07 DIAGNOSIS — H903 Sensorineural hearing loss, bilateral: Secondary | ICD-10-CM | POA: Diagnosis not present

## 2019-04-08 ENCOUNTER — Other Ambulatory Visit: Payer: Self-pay | Admitting: Osteopathic Medicine

## 2019-04-10 DIAGNOSIS — E669 Obesity, unspecified: Secondary | ICD-10-CM | POA: Diagnosis not present

## 2019-04-10 DIAGNOSIS — M2550 Pain in unspecified joint: Secondary | ICD-10-CM | POA: Diagnosis not present

## 2019-04-10 DIAGNOSIS — R768 Other specified abnormal immunological findings in serum: Secondary | ICD-10-CM | POA: Diagnosis not present

## 2019-04-10 DIAGNOSIS — M150 Primary generalized (osteo)arthritis: Secondary | ICD-10-CM | POA: Diagnosis not present

## 2019-04-10 DIAGNOSIS — R5383 Other fatigue: Secondary | ICD-10-CM | POA: Diagnosis not present

## 2019-04-10 DIAGNOSIS — M791 Myalgia, unspecified site: Secondary | ICD-10-CM | POA: Diagnosis not present

## 2019-04-10 DIAGNOSIS — Z6831 Body mass index (BMI) 31.0-31.9, adult: Secondary | ICD-10-CM | POA: Diagnosis not present

## 2019-04-18 ENCOUNTER — Telehealth: Payer: Self-pay

## 2019-04-18 NOTE — Telephone Encounter (Signed)
Can try adding over-the-counter antihistamine such as Allegra, Claritin, Zyrtec.  Or any of these with the Sudafed decongestant.  I would probably not make any further recommendations without evaluation/virtual visit.  If patient gets worse, would recommend urgent care over the weekend if needed.

## 2019-04-18 NOTE — Telephone Encounter (Signed)
Pt called stating she is having a bad case of severe allergies she can't "shake off". Having nasal congestion with drainage, red itchy eyes and throat. Denies fever, body aches or chills. As per pt, she has been using Nasacort but it gives her temporary relief. Requesting an [facility] [address].

## 2019-04-18 NOTE — Telephone Encounter (Signed)
Patient advised.

## 2019-05-05 ENCOUNTER — Other Ambulatory Visit: Payer: Self-pay

## 2019-05-05 MED ORDER — LOSARTAN POTASSIUM 100 MG PO TABS: 100.0000 mg | ORAL_TABLET | Freq: Every day | ORAL | 3 refills | Status: DC

## 2019-05-05 MED ORDER — HYDROCHLOROTHIAZIDE 25 MG PO TABS: 25.0000 mg | ORAL_TABLET | Freq: Every day | ORAL | 3 refills | Status: DC

## 2019-05-12 DIAGNOSIS — M150 Primary generalized (osteo)arthritis: Secondary | ICD-10-CM | POA: Diagnosis not present

## 2019-05-12 DIAGNOSIS — R768 Other specified abnormal immunological findings in serum: Secondary | ICD-10-CM | POA: Diagnosis not present

## 2019-05-12 DIAGNOSIS — R5383 Other fatigue: Secondary | ICD-10-CM | POA: Diagnosis not present

## 2019-05-12 DIAGNOSIS — M2550 Pain in unspecified joint: Secondary | ICD-10-CM | POA: Diagnosis not present

## 2019-05-12 DIAGNOSIS — M791 Myalgia, unspecified site: Secondary | ICD-10-CM | POA: Diagnosis not present

## 2019-06-18 DIAGNOSIS — Z79899 Other long term (current) drug therapy: Secondary | ICD-10-CM | POA: Diagnosis not present

## 2019-06-18 DIAGNOSIS — I480 Paroxysmal atrial fibrillation: Secondary | ICD-10-CM | POA: Diagnosis not present

## 2019-06-26 ENCOUNTER — Ambulatory Visit (INDEPENDENT_AMBULATORY_CARE_PROVIDER_SITE_OTHER): Payer: Medicare Other | Admitting: Osteopathic Medicine

## 2019-06-26 ENCOUNTER — Encounter: Payer: Self-pay | Admitting: Osteopathic Medicine

## 2019-06-26 ENCOUNTER — Other Ambulatory Visit: Payer: Self-pay

## 2019-06-26 VITALS — BP 160/73 | HR 70 | Temp 97.7°F | Wt 188.1 lb

## 2019-06-26 DIAGNOSIS — N3000 Acute cystitis without hematuria: Secondary | ICD-10-CM | POA: Diagnosis not present

## 2019-06-26 DIAGNOSIS — B3731 Acute candidiasis of vulva and vagina: Secondary | ICD-10-CM

## 2019-06-26 DIAGNOSIS — B373 Candidiasis of vulva and vagina: Secondary | ICD-10-CM

## 2019-06-26 DIAGNOSIS — K644 Residual hemorrhoidal skin tags: Secondary | ICD-10-CM | POA: Diagnosis not present

## 2019-06-26 DIAGNOSIS — N898 Other specified noninflammatory disorders of vagina: Secondary | ICD-10-CM | POA: Diagnosis not present

## 2019-06-26 LAB — POCT URINALYSIS DIP (CLINITEK)
Bilirubin, UA: NEGATIVE
Blood, UA: NEGATIVE
Glucose, UA: NEGATIVE mg/dL
Ketones, POC UA: NEGATIVE mg/dL
Nitrite, UA: POSITIVE — AB
POC PROTEIN,UA: NEGATIVE
Spec Grav, UA: 1.025 (ref 1.010–1.025)
Urobilinogen, UA: 0.2 E.U./dL
pH, UA: 6 (ref 5.0–8.0)

## 2019-06-26 MED ORDER — FLUCONAZOLE 150 MG PO TABS: 150.0000 mg | ORAL_TABLET | Freq: Once | ORAL | 1 refills | Status: AC

## 2019-06-26 MED ORDER — NITROFURANTOIN MONOHYD MACRO 100 MG PO CAPS: 100.0000 mg | ORAL_CAPSULE | Freq: Two times a day (BID) | ORAL | 0 refills | Status: DC

## 2019-06-26 MED ORDER — AMLODIPINE BESYLATE 10 MG PO TABS: 10.0000 mg | ORAL_TABLET | Freq: Every day | ORAL | 0 refills | Status: DC

## 2019-06-26 MED ORDER — METOPROLOL TARTRATE 50 MG PO TABS: 50.0000 mg | ORAL_TABLET | Freq: Two times a day (BID) | ORAL | Status: DC

## 2019-06-26 NOTE — Patient Instructions (Signed)
Plan:  For yeast infections/urinary tract infection, I have sent a few medications into the pharmacy.  Diflucan will treat yeast infection, take 1 pill now and another pill in 2 to 3 days if itching persists.  Nitrofurantoin will treat UTI.  You are sending the urine out for culture, if we need to change the UTI antibiotics based on culture results, I will let you know.  Otherwise, assume no news is good news!  For blood pressure, let us try increasing amlodipine from 5 mg daily to 10 mg daily.  Since there are a few discrepancies between your lists from this office and from cardiology office, can you please check your medications at home and confirm whether you are also taking hydroxychloroquine, and trazodone?

## 2019-06-26 NOTE — Progress Notes (Signed)
HPI: [name] is a 84 y.o. female who  has a past medical history of Arthritis, Asthma, Atrial fibrillation ([place]), Chronic sinusitis, Complication of anesthesia, Dyspnea, Family history of adverse reaction to anesthesia, Follicular lymphoma ([place]), Hyperlipidemia, Hypertension, Hypothyroidism, Meniere's disease, PONV (postoperative nausea and vomiting), Primary localized osteoarthritis of left knee (09/24/2018), Primary localized osteoarthritis of right knee (06/18/2018), and Reflux.  she presents to [facility] today, 06/26/19,  for chief complaint of:  Rash BP  Rash . Location: vaginal / rectal area  . Quality: itching . Duration: few weeks . Assoc signs/symptoms: dysuria  BP Home BP above goal, 170/80+ on occasion No CP/SOB Flecainide added by Cardio EP Med list reviewed:  For antihypertensives patient is taking Norvasc 5 mg, Cozaar 100 mg, Lopressor 50 mg twice daily.   Cardiology med list also has that patient is taking hydroxychloroquine, Atrovent, trazodone?  Patient cannot confirm.      At today's visit 06/26/19 ... PMH, PSH, FH reviewed and updated as needed.  Current medication list and allergy/intolerance hx reviewed and updated as needed. (See remainder of HPI, ROS, Phys Exam below)   No results found.  No results found for this or any previous visit (from the past 72 hour(s)).        ASSESSMENT/PLAN: The primary encounter diagnosis was Itching in the vaginal area. Diagnoses of Yeast infection involving the vagina and surrounding area, External hemorrhoid, and Acute cystitis without hematuria were also pertinent to this visit.  Orders Placed This Encounter  Procedures  . Urine Culture  . Urinalysis, microscopic only  . POCT URINALYSIS DIP (CLINITEK)     Meds ordered this encounter  Medications  . fluconazole (DIFLUCAN) 150 MG tablet    Sig: Take 1 tablet (150 mg total) by mouth once for 1 dose. Repeat dose 72  hours if yeast infection persists    Dispense:  2 tablet    Refill:  1  . nitrofurantoin, macrocrystal-monohydrate, (MACROBID) 100 MG capsule    Sig: Take 1 capsule (100 mg total) by mouth 2 (two) times daily.    Dispense:  10 capsule    Refill:  0  . metoprolol tartrate (LOPRESSOR) 50 MG tablet    Sig: Take 1 tablet (50 mg total) by mouth 2 (two) times daily.  Marland Kitchen amLODipine (NORVASC) 10 MG tablet    Sig: Take 1 tablet (10 mg total) by mouth daily.    Dispense:  90 tablet    Refill:  0    Replaces amlodipine 5 mg    Patient Instructions  Plan:  For yeast infections/urinary tract infection, I have sent a few medications into the pharmacy.  Diflucan will treat yeast infection, take 1 pill now and another pill in 2 to 3 days if itching persists.  Nitrofurantoin will treat UTI.  You are sending the urine out for culture, if we need to change the UTI antibiotics based on culture results, I will let you know.  Otherwise, assume no news is good news!  For blood pressure, let us try increasing amlodipine from 5 mg daily to 10 mg daily.  Since there are a few discrepancies between your lists from this office and from cardiology office, can you please check your medications at home and confirm whether you are also taking hydroxychloroquine, and trazodone?        Follow-up plan: Return if symptoms worsen or fail to improve.                                                 ################################################# ################################################# ################################################# #################################################  No outpatient medications have been marked as taking for the 06/26/19 encounter (Office Visit) with [name], DO.    No Known Allergies     Review of Systems:  Constitutional: No recent illness  Cardiac: No  chest pain, No  pressure, No  palpitations  Respiratory:  No  shortness of breath  Gastrointestinal: No  abdominal pain  Musculoskeletal: No new myalgia/arthralgia  Skin: +Rash  Neurologic: No  weakness, No  Dizziness   Exam:  BP (!) 160/73 (BP Location: Left Arm, Patient Position: Sitting, Cuff Size: Normal)   Pulse 70   Temp 97.7 F (36.5 C) (Oral)   Wt 188 lb 1.3 oz (85.3 kg)   BMI 31.30 kg/m   Constitutional: VS see above. General Appearance: alert, well-developed, well-nourished, NAD  Neck: No masses, trachea midline.   Respiratory: Normal respiratory effort.   Musculoskeletal: Gait normal. Symmetric and independent movement of all extremities  Neurological: Normal balance/coordination. No tremor.  Skin: warm, dry, intact. (+)erythematous scaling rash in gluteal cleft   Psychiatric: Normal judgment/insight. Normal mood and affect. Oriented x3.       Visit summary with medication list and pertinent instructions was printed for patient to review, patient was advised to alert [place] if any updates are needed. All questions at time of visit were answered - patient instructed to contact office with any additional concerns. ER/RTC precautions were reviewed with the patient and understanding verbalized.    Please note: voice recognition software was used to produce this document, and typos may escape review. Please contact Dr. Sheppard Coil for any needed clarifications.    Follow up plan: Return if symptoms worsen or fail to improve.

## 2019-06-28 LAB — URINE CULTURE
MICRO NUMBER:: 1208371
SPECIMEN QUALITY:: ADEQUATE

## 2019-06-28 LAB — URINALYSIS, MICROSCOPIC ONLY
Hyaline Cast: NONE SEEN /LPF
Squamous Epithelial / HPF: NONE SEEN /HPF (ref <=5)
WBC, UA: >=60 /HPF — AB (ref 0–5)

## 2019-06-30 MED ORDER — CIPROFLOXACIN HCL 500 MG PO TABS: 500.0000 mg | ORAL_TABLET | Freq: Two times a day (BID) | ORAL | 0 refills | Status: AC

## 2019-06-30 NOTE — Addendum Note (Signed)
Addended by: Maryla Morrow on: 06/30/2019 06:03 PM   Modules accepted: Orders

## 2019-07-09 ENCOUNTER — Telehealth: Payer: Self-pay

## 2019-07-09 NOTE — Telephone Encounter (Signed)
Would have patient scheudle appointment for recollection of urine specimen given previous UTI - can see Joy or Laretta Bolster or whoever is available

## 2019-07-09 NOTE — Telephone Encounter (Signed)
Pt called stating she continues to have vaginal itching. She mentioned that macrobid and diflucan was given at last office visit. Pt is requesting if provider can send in [facility]. Pls advise, thanks.

## 2019-07-10 DIAGNOSIS — I10 Essential (primary) hypertension: Secondary | ICD-10-CM | POA: Diagnosis not present

## 2019-07-10 DIAGNOSIS — R0600 Dyspnea, unspecified: Secondary | ICD-10-CM | POA: Diagnosis not present

## 2019-07-10 DIAGNOSIS — I4891 Unspecified atrial fibrillation: Secondary | ICD-10-CM | POA: Diagnosis not present

## 2019-07-10 DIAGNOSIS — C8232 Follicular lymphoma grade IIIa, intrathoracic lymph nodes: Secondary | ICD-10-CM | POA: Diagnosis not present

## 2019-07-10 MED ORDER — SULFAMETHOXAZOLE-TRIMETHOPRIM 800-160 MG PO TABS: 1.0000 | ORAL_TABLET | Freq: Two times a day (BID) | ORAL | 0 refills | Status: DC

## 2019-07-10 MED ORDER — FLUCONAZOLE 150 MG PO TABS: 150.0000 mg | ORAL_TABLET | Freq: Once | ORAL | 0 refills | Status: AC

## 2019-07-10 NOTE — Telephone Encounter (Signed)
Unable to leave a message, no voicemail.  

## 2019-07-10 NOTE — Telephone Encounter (Signed)
UTI on 12/17. abx switched due to sensitivity report. Pt still having symptoms. Needs appt to check urine for clearance. Holiday weekend. Sent 7 days of bactrim and diflucan.

## 2019-07-15 NOTE — Telephone Encounter (Signed)
Left pt msg asking for update and advised to schedule OV if still having ANY SX

## 2019-07-23 ENCOUNTER — Other Ambulatory Visit: Payer: Self-pay

## 2019-07-23 DIAGNOSIS — M545 Low back pain: Secondary | ICD-10-CM | POA: Diagnosis not present

## 2019-07-23 DIAGNOSIS — M25551 Pain in right hip: Secondary | ICD-10-CM | POA: Diagnosis not present

## 2019-07-23 DIAGNOSIS — M25561 Pain in right knee: Secondary | ICD-10-CM | POA: Diagnosis not present

## 2019-07-23 MED ORDER — PRAVASTATIN SODIUM 80 MG PO TABS: 80.0000 mg | ORAL_TABLET | Freq: Every day | ORAL | 1 refills | Status: DC

## 2019-07-23 MED ORDER — METOPROLOL TARTRATE 50 MG PO TABS: 50.0000 mg | ORAL_TABLET | Freq: Two times a day (BID) | ORAL | 1 refills | Status: DC

## 2019-07-24 ENCOUNTER — Other Ambulatory Visit: Payer: Self-pay

## 2019-07-24 MED ORDER — AMLODIPINE BESYLATE 10 MG PO TABS: 10.0000 mg | ORAL_TABLET | Freq: Every day | ORAL | 1 refills | Status: DC

## 2019-07-24 MED ORDER — PRAVASTATIN SODIUM 80 MG PO TABS: 80.0000 mg | ORAL_TABLET | Freq: Every day | ORAL | 1 refills | Status: DC

## 2019-07-30 DIAGNOSIS — G5603 Carpal tunnel syndrome, bilateral upper limbs: Secondary | ICD-10-CM | POA: Diagnosis not present

## 2019-08-01 DIAGNOSIS — C8232 Follicular lymphoma grade IIIa, intrathoracic lymph nodes: Secondary | ICD-10-CM | POA: Diagnosis not present

## 2019-08-12 DIAGNOSIS — R768 Other specified abnormal immunological findings in serum: Secondary | ICD-10-CM | POA: Diagnosis not present

## 2019-08-12 DIAGNOSIS — Z79899 Other long term (current) drug therapy: Secondary | ICD-10-CM | POA: Diagnosis not present

## 2019-08-12 DIAGNOSIS — M791 Myalgia, unspecified site: Secondary | ICD-10-CM | POA: Diagnosis not present

## 2019-08-12 DIAGNOSIS — M150 Primary generalized (osteo)arthritis: Secondary | ICD-10-CM | POA: Diagnosis not present

## 2019-08-12 DIAGNOSIS — M2550 Pain in unspecified joint: Secondary | ICD-10-CM | POA: Diagnosis not present

## 2019-08-12 DIAGNOSIS — E669 Obesity, unspecified: Secondary | ICD-10-CM | POA: Diagnosis not present

## 2019-08-12 DIAGNOSIS — R5383 Other fatigue: Secondary | ICD-10-CM | POA: Diagnosis not present

## 2019-08-12 DIAGNOSIS — Z6832 Body mass index (BMI) 32.0-32.9, adult: Secondary | ICD-10-CM | POA: Diagnosis not present

## 2019-08-22 DIAGNOSIS — R0600 Dyspnea, unspecified: Secondary | ICD-10-CM | POA: Diagnosis not present

## 2019-08-22 DIAGNOSIS — R5383 Other fatigue: Secondary | ICD-10-CM | POA: Diagnosis not present

## 2019-08-22 DIAGNOSIS — I480 Paroxysmal atrial fibrillation: Secondary | ICD-10-CM | POA: Diagnosis not present

## 2019-08-22 DIAGNOSIS — I10 Essential (primary) hypertension: Secondary | ICD-10-CM | POA: Diagnosis not present

## 2019-08-22 DIAGNOSIS — I493 Ventricular premature depolarization: Secondary | ICD-10-CM | POA: Diagnosis not present

## 2019-08-22 DIAGNOSIS — Z79899 Other long term (current) drug therapy: Secondary | ICD-10-CM | POA: Diagnosis not present

## 2019-08-23 DIAGNOSIS — I4510 Unspecified right bundle-branch block: Secondary | ICD-10-CM | POA: Diagnosis not present

## 2019-08-23 DIAGNOSIS — I498 Other specified cardiac arrhythmias: Secondary | ICD-10-CM | POA: Diagnosis not present

## 2019-08-23 DIAGNOSIS — I219 Acute myocardial infarction, unspecified: Secondary | ICD-10-CM | POA: Diagnosis not present

## 2019-08-23 DIAGNOSIS — I252 Old myocardial infarction: Secondary | ICD-10-CM | POA: Diagnosis not present

## 2019-08-23 DIAGNOSIS — I440 Atrioventricular block, first degree: Secondary | ICD-10-CM | POA: Diagnosis not present

## 2019-08-23 DIAGNOSIS — R9431 Abnormal electrocardiogram [ECG] [EKG]: Secondary | ICD-10-CM | POA: Diagnosis not present

## 2019-08-27 DIAGNOSIS — Z79899 Other long term (current) drug therapy: Secondary | ICD-10-CM | POA: Diagnosis not present

## 2019-08-27 DIAGNOSIS — H40013 Open angle with borderline findings, low risk, bilateral: Secondary | ICD-10-CM | POA: Diagnosis not present

## 2019-08-27 DIAGNOSIS — H26492 Other secondary cataract, left eye: Secondary | ICD-10-CM | POA: Diagnosis not present

## 2019-08-27 IMAGING — DX DG KNEE 1-2V PORT*R*
2 series · 2 of 2 positions shown · non-contrast
Comparison: None.

CLINICAL DATA: Osteoarthritis of the medial compartment of the
right knee. Status post unicompartmental knee arthroplasty.

EXAM:
PORTABLE RIGHT KNEE - 1-2 VIEW

[knee ap]
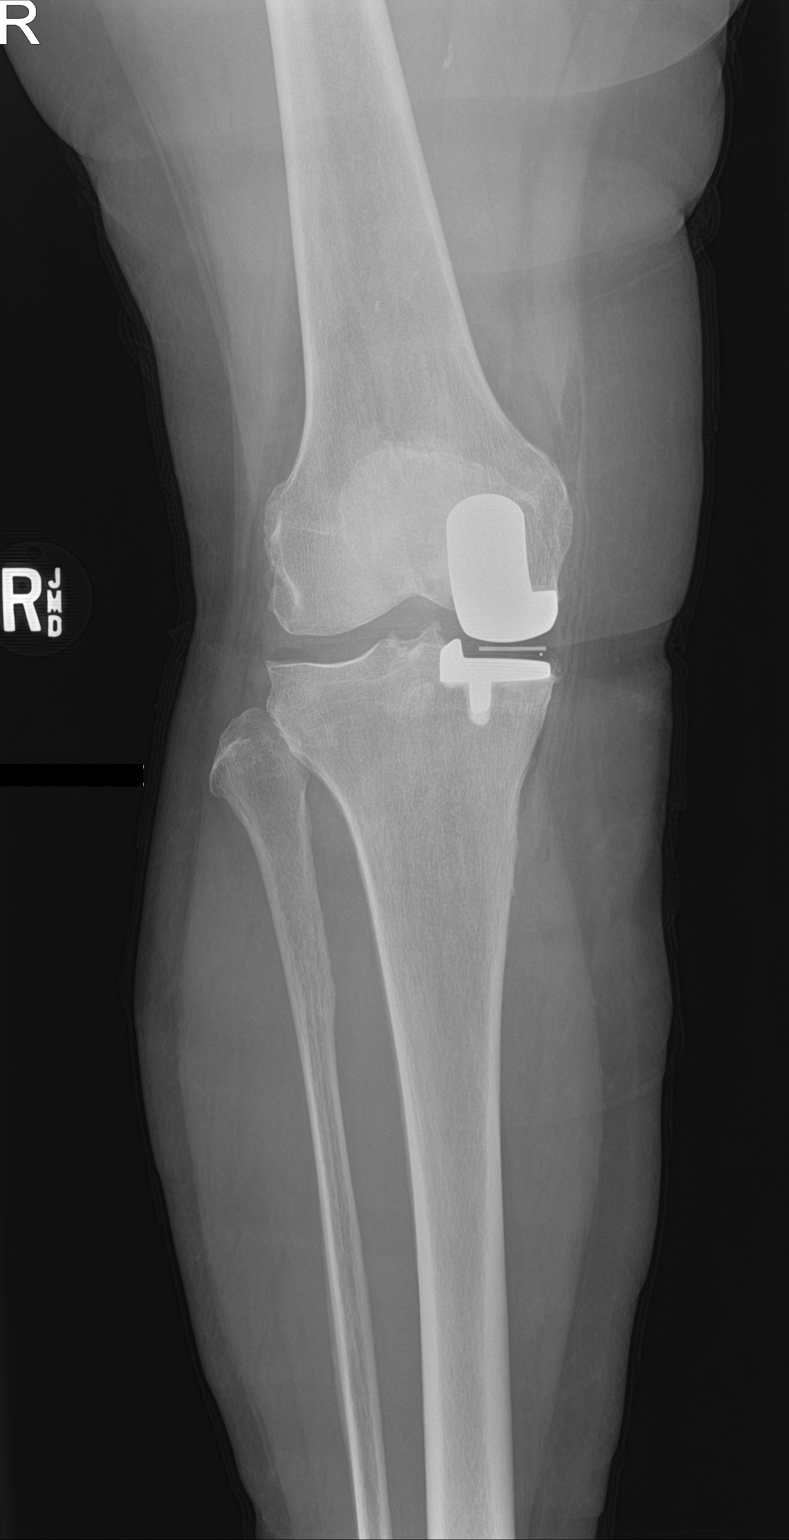

[knee lat]
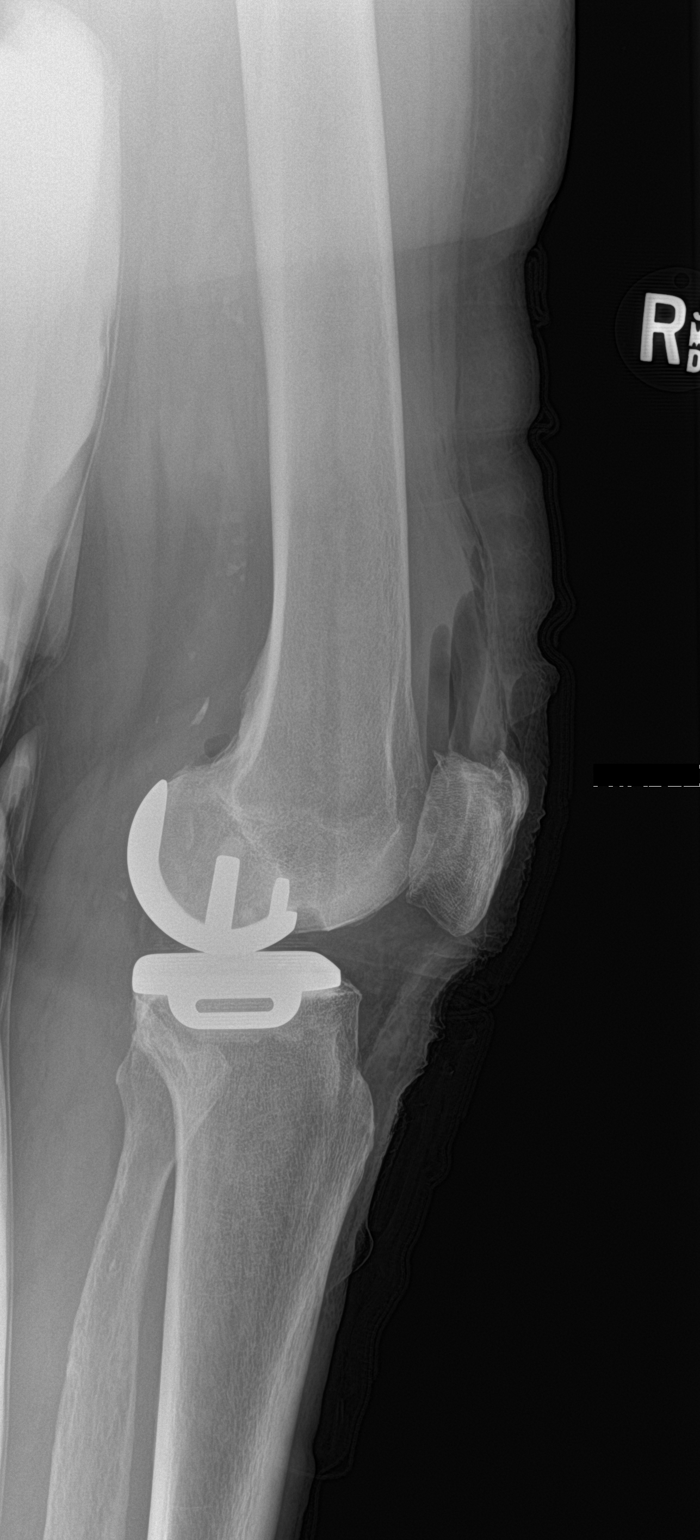

[2 of 2 positions shown; findings below may reference images not displayed]

FINDINGS: The components of the right medial compartment arthroplasty appear
in good position. No fractures. Fluid and air in the joint to the
expected degree after surgery.
IMPRESSION: Satisfactory appearance of the right knee after medial compartment
arthroplasty.

## 2019-09-03 DIAGNOSIS — M545 Low back pain: Secondary | ICD-10-CM | POA: Diagnosis not present

## 2019-09-03 DIAGNOSIS — Z96652 Presence of left artificial knee joint: Secondary | ICD-10-CM | POA: Diagnosis not present

## 2019-09-04 DIAGNOSIS — G5602 Carpal tunnel syndrome, left upper limb: Secondary | ICD-10-CM | POA: Diagnosis not present

## 2019-09-05 DIAGNOSIS — G5602 Carpal tunnel syndrome, left upper limb: Secondary | ICD-10-CM | POA: Diagnosis not present

## 2019-09-12 ENCOUNTER — Other Ambulatory Visit: Payer: Self-pay

## 2019-09-12 MED ORDER — HYDROCHLOROTHIAZIDE 25 MG PO TABS: 25.0000 mg | ORAL_TABLET | Freq: Every day | ORAL | 1 refills | Status: DC

## 2019-09-12 MED ORDER — AMLODIPINE BESYLATE 10 MG PO TABS: 10.0000 mg | ORAL_TABLET | Freq: Every day | ORAL | 1 refills | Status: DC

## 2019-09-16 ENCOUNTER — Other Ambulatory Visit: Payer: Self-pay

## 2019-09-16 MED ORDER — AMLODIPINE BESYLATE 10 MG PO TABS: 10.0000 mg | ORAL_TABLET | Freq: Every day | ORAL | 1 refills | Status: DC

## 2019-09-16 MED ORDER — HYDROCHLOROTHIAZIDE 25 MG PO TABS: 25.0000 mg | ORAL_TABLET | Freq: Every day | ORAL | 1 refills | Status: DC

## 2019-09-22 ENCOUNTER — Other Ambulatory Visit: Payer: Self-pay | Admitting: Osteopathic Medicine

## 2019-09-29 DIAGNOSIS — M545 Low back pain: Secondary | ICD-10-CM | POA: Diagnosis not present

## 2019-09-30 DIAGNOSIS — I083 Combined rheumatic disorders of mitral, aortic and tricuspid valves: Secondary | ICD-10-CM | POA: Diagnosis not present

## 2019-10-24 ENCOUNTER — Other Ambulatory Visit: Payer: Self-pay

## 2019-10-24 ENCOUNTER — Ambulatory Visit (INDEPENDENT_AMBULATORY_CARE_PROVIDER_SITE_OTHER): Payer: Medicare Other

## 2019-10-24 ENCOUNTER — Ambulatory Visit (INDEPENDENT_AMBULATORY_CARE_PROVIDER_SITE_OTHER): Payer: Medicare Other | Admitting: Osteopathic Medicine

## 2019-10-24 ENCOUNTER — Encounter: Payer: Self-pay | Admitting: Osteopathic Medicine

## 2019-10-24 ENCOUNTER — Other Ambulatory Visit: Payer: Self-pay | Admitting: Osteopathic Medicine

## 2019-10-24 VITALS — BP 134/69 | HR 64 | Wt 189.0 lb

## 2019-10-24 DIAGNOSIS — R1310 Dysphagia, unspecified: Secondary | ICD-10-CM

## 2019-10-24 DIAGNOSIS — E039 Hypothyroidism, unspecified: Secondary | ICD-10-CM | POA: Diagnosis not present

## 2019-10-24 DIAGNOSIS — R252 Cramp and spasm: Secondary | ICD-10-CM

## 2019-10-24 DIAGNOSIS — R6889 Other general symptoms and signs: Secondary | ICD-10-CM

## 2019-10-24 DIAGNOSIS — R5382 Chronic fatigue, unspecified: Secondary | ICD-10-CM

## 2019-10-24 DIAGNOSIS — B029 Zoster without complications: Secondary | ICD-10-CM

## 2019-10-24 DIAGNOSIS — R232 Flushing: Secondary | ICD-10-CM | POA: Diagnosis not present

## 2019-10-24 HISTORY — DX: Zoster without complications: B02.9

## 2019-10-24 MED ORDER — AMLODIPINE BESYLATE 5 MG PO TABS: 5.0000 mg | ORAL_TABLET | Freq: Every day | ORAL | 1 refills | Status: DC

## 2019-10-24 MED ORDER — METOPROLOL TARTRATE 75 MG PO TABS: 75.0000 mg | ORAL_TABLET | Freq: Two times a day (BID) | ORAL | 1 refills | Status: DC

## 2019-10-24 NOTE — Telephone Encounter (Signed)
Routing to provider and coordinator. Prior authorization is required.

## 2019-10-24 NOTE — Patient Instructions (Addendum)
Labs today  Ultrasound thyroid  May consider GI referral for swallowing issue Refilled Rx Will be in touch w/ results once I have results!  Keep an eye on BP at home: goal around 140/90

## 2019-10-24 NOTE — Progress Notes (Signed)
[name] is a 85 y.o. female who presents to  [place] at [facility]  today, 10/24/19, seeking care for the following: . Fatigue - concern about thyroid function, feeling fatigue, heat/cold intolerance, legs cramping.  . Amlodipine & metoprolol refill . Dysphagia/choking on occasion, Hx GERD but no current heartburn      ASSESSMENT & PLAN with other pertinent history/findings:  The primary encounter diagnosis was Hypothyroidism, unspecified type. Diagnoses of Muscle cramp, Chronic fatigue, Hot flashes, Cold intolerance, and Dysphagia, unspecified type were also pertinent to this visit.  Patient Instructions  Labs today  Ultrasound thyroid  May consider GI referral for swallowing issue Refilled Rx Will be in touch w/ results once I have results!  Keep an eye on BP at home: goal around 140/90        Orders Placed This Encounter  Procedures  . US THYROID  . Urinalysis, Routine w reflex microscopic  . CBC with Differential/Platelet  . COMPLETE METABOLIC PANEL WITH GFR  . TSH  . T4, free  . Magnesium    Meds ordered this encounter  Medications  . amLODipine (NORVASC) 5 MG tablet    Sig: Take 1 tablet (5 mg total) by mouth daily.    Dispense:  90 tablet    Refill:  1    Replaces amlodipine 5 mg  . metoprolol tartrate 75 MG TABS    Sig: Take 75 mg by mouth 2 (two) times daily.    Dispense:  180 tablet    Refill:  1       Follow-up instructions: Return for RECHECK PENDING RESULTS / IF WORSE OR CHANGE. CALL/MESSAGE W/ BP NUMBERS NEXT WEEK .                                         BP (!) 187/72 (BP Location: Right Arm, Patient Position: Sitting, Cuff Size: Normal)   Pulse 64   Wt 189 lb (85.7 kg)   SpO2 96%   BMI 31.45 kg/m  Recheck 134/69 BP    Constitutional:  . VSS, see nurse notes . General Appearance: alert, well-developed, well-nourished, NAD Eyes: Marland Kitchen Normal  lids and conjunctive, non-icteric sclera . PERRLA Neck: . No masses, trachea midline . No thyroid enlargement/tenderness/mass appreciated Respiratory: . Normal respiratory effort . Breath sounds normal, no wheeze/rhonchi/rales Cardiovascular: . S1/S2 normal, no murmur/rub/gallop auscultated . No lower extremity edema Gastrointestinal: . no masses . No hepatomegaly, no splenomegaly . No hernia appreciated Musculoskeletal:  . Gait normal Neurological: . No cranial nerve deficit on limited exam . Motor and sensation intact and symmetric Psychiatric: . Normal judgment/insight . Normal mood and affect  Current Meds  Medication Sig  . acyclovir ointment (ZOVIRAX) 5 % Apply 1 application topically daily as needed (for cold sores).  Marland Kitchen amLODipine (NORVASC) 5 MG tablet Take 1 tablet (5 mg total) by mouth daily.  Marland Kitchen aspirin EC 81 MG tablet Take 81 mg by mouth daily.  . baclofen (LIORESAL) 10 MG tablet Take 1 tablet (10 mg total) by mouth 3 (three) times daily as needed (muscle spasms).  . Calcium Citrate-Vitamin D (CALCIUM CITRATE+D3 PO) Take 1 tablet by mouth every evening.  . flecainide (TAMBOCOR) 50 MG tablet Take by mouth.  . fluconazole (DIFLUCAN) 150 MG tablet Take 150 mg by mouth once.  . fluticasone (FLOVENT HFA) 110 MCG/ACT inhaler Inhale 2 puffs into  the lungs 2 (two) times daily as needed (for shortness of breath or wheezing).  . gabapentin (NEURONTIN) 100 MG capsule   . hydrochlorothiazide (HYDRODIURIL) 25 MG tablet Take 1 tablet (25 mg total) by mouth daily.  Marland Kitchen HYDROcodone-acetaminophen (NORCO) 10-325 MG tablet Take 1 tablet by mouth every 6 (six) hours as needed.  . hydroxychloroquine (PLAQUENIL) 200 MG tablet   . ipratropium (ATROVENT) 0.03 % nasal spray Place into the nose.  . isosorbide mononitrate (IMDUR) 30 MG 24 hr tablet Take 1 tablet (30 mg total) by mouth daily.  Marland Kitchen levothyroxine (SYNTHROID) 112 MCG tablet Take 1 tablet (112 mcg total) by mouth daily before  breakfast.  . losartan (COZAAR) 100 MG tablet Take 1 tablet (100 mg total) by mouth at bedtime.  . Magnesium (V-R MAGNESIUM) 250 MG TABS Take by mouth.  . meclizine (ANTIVERT) 25 MG tablet Take 1 tablet (25 mg total) by mouth 3 (three) times daily as needed for dizziness.  . meloxicam (MOBIC) 15 MG tablet Take 15 mg by mouth daily as needed for pain.  . methylPREDNISolone (MEDROL) 4 MG tablet Take by mouth as directed.  . methylPREDNISolone acetate (DEPO-MEDROL) 40 MG/ML injection SMARTSIG:2 Milliliter(s) IM Once PRN  . metoprolol tartrate 75 MG TABS Take 75 mg by mouth 2 (two) times daily.  Marland Kitchen omeprazole (PRILOSEC) 40 MG capsule TAKE 1 CAPSULE BY MOUTH IN  THE MORNING AND MAY REPEAT  DOSE IN THE EVENING AS  NEEDED FOR ACID REFLUX /  INDIGESTION  . ondansetron (ZOFRAN) 4 MG tablet Take 1 tablet (4 mg total) by mouth every 8 (eight) hours as needed for nausea or vomiting.  . potassium chloride (KLOR-CON) 10 MEQ tablet Take by mouth.  . pravastatin (PRAVACHOL) 80 MG tablet Take 1 tablet (80 mg total) by mouth at bedtime.  . sennosides-docusate sodium (SENOKOT-S) 8.6-50 MG tablet Take 2 tablets by mouth daily. (Patient taking differently: Take 2 tablets by mouth daily as needed (constipation.). )  . sulfamethoxazole-trimethoprim (BACTRIM DS) 800-160 MG tablet Take 1 tablet by mouth 2 (two) times daily.  Marland Kitchen tetrahydrozoline (VISINE) 0.05 % ophthalmic solution Place 2 drops into both eyes daily as needed (for dry eyes).  . triamcinolone (NASACORT ALLERGY 24HR) 55 MCG/ACT AERO nasal inhaler Place 2 sprays into the nose daily. (Patient taking differently: Place 2 sprays into the nose daily as needed (allergies.). )  . [DISCONTINUED] amLODipine (NORVASC) 10 MG tablet Take 1 tablet (10 mg total) by mouth daily.  . [DISCONTINUED] metoprolol tartrate (LOPRESSOR) 50 MG tablet Take 1 tablet (50 mg total) by mouth 2 (two) times daily.    No results found for this or any previous visit (from the past 72  hour(s)).  No results found.  Depression screen [facility] 2/9 03/10/2019 09/03/2018  Decreased Interest 0 0  Down, Depressed, Hopeless 0 0  PHQ - 2 Score 0 0  Altered sleeping - 3  Tired, decreased energy - 3  Change in appetite - 0  Feeling bad or failure about yourself  - 0  Trouble concentrating - 0  Moving slowly or fidgety/restless - 0  Suicidal thoughts - 0  PHQ-9 Score - 6  Difficult doing work/chores - Very difficult    GAD 7 : Generalized Anxiety Score 09/03/2018  Nervous, Anxious, on Edge 0  Control/stop worrying 0  Worry too much - different things 0  Trouble relaxing 0  Restless 0  Easily annoyed or irritable 0  Afraid - awful might happen 0  Total GAD 7 Score 0  All questions at time of visit were answered - patient instructed to contact office with any additional concerns or updates.  ER/RTC precautions were reviewed with the patient.  Please note: voice recognition software was used to produce this document, and typos may escape review. Please contact Dr. Sheppard Coil for any needed clarifications.

## 2019-10-25 LAB — CBC WITH DIFFERENTIAL/PLATELET
Absolute Monocytes: 543 cells/uL (ref 200–950)
Basophils Absolute: 28 cells/uL (ref 0–200)
Basophils Relative: 0.5 %
Eosinophils Absolute: 101 cells/uL (ref 15–500)
Eosinophils Relative: 1.8 %
HCT: 41 % (ref 35.0–45.0)
Hemoglobin: 13.7 g/dL (ref 11.7–15.5)
Lymphs Abs: 1943 cells/uL (ref 850–3900)
MCH: 29.2 pg (ref 27.0–33.0)
MCHC: 33.4 g/dL (ref 32.0–36.0)
MCV: 87.4 fL (ref 80.0–100.0)
MPV: 9.9 fL (ref 7.5–12.5)
Monocytes Relative: 9.7 %
Neutro Abs: 2985 cells/uL (ref 1500–7800)
Neutrophils Relative %: 53.3 %
Platelets: 243 10*3/uL (ref 140–400)
RBC: 4.69 10*6/uL (ref 3.80–5.10)
RDW: 12.9 % (ref 11.0–15.0)
Total Lymphocyte: 34.7 %
WBC: 5.6 10*3/uL (ref 3.8–10.8)

## 2019-10-25 LAB — URINALYSIS, ROUTINE W REFLEX MICROSCOPIC
Bilirubin Urine: NEGATIVE
Glucose, UA: NEGATIVE
Hgb urine dipstick: NEGATIVE
Ketones, ur: NEGATIVE
Leukocytes,Ua: NEGATIVE
Nitrite: NEGATIVE
Protein, ur: NEGATIVE
Specific Gravity, Urine: 1.016 (ref 1.001–1.03)
pH: 5.5 (ref 5.0–8.0)

## 2019-10-25 LAB — COMPLETE METABOLIC PANEL WITH GFR
AG Ratio: 1.6 (calc) (ref 1.0–2.5)
ALT: 18 U/L (ref 6–29)
AST: 22 U/L (ref 10–35)
Albumin: 4.5 g/dL (ref 3.6–5.1)
Alkaline phosphatase (APISO): 43 U/L (ref 37–153)
BUN/Creatinine Ratio: 16 (calc) (ref 6–22)
BUN: 19 mg/dL (ref 7–25)
CO2: 29 mmol/L (ref 20–32)
Calcium: 9.9 mg/dL (ref 8.6–10.4)
Chloride: 97 mmol/L — ABNORMAL LOW (ref 98–110)
Creat: 1.16 mg/dL — ABNORMAL HIGH (ref 0.60–0.88)
GFR, Est African American: 51 mL/min/{1.73_m2} — ABNORMAL LOW (ref >=60)
GFR, Est Non African American: 44 mL/min/{1.73_m2} — ABNORMAL LOW (ref >=60)
Globulin: 2.8 g/dL (calc) (ref 1.9–3.7)
Glucose, Bld: 95 mg/dL (ref 65–139)
Potassium: 4.5 mmol/L (ref 3.5–5.3)
Sodium: 135 mmol/L (ref 135–146)
Total Bilirubin: 0.8 mg/dL (ref 0.2–1.2)
Total Protein: 7.3 g/dL (ref 6.1–8.1)

## 2019-10-25 LAB — MAGNESIUM: Magnesium: 2.2 mg/dL (ref 1.5–2.5)

## 2019-10-25 LAB — TSH: TSH: 1.41 mIU/L (ref 0.40–4.50)

## 2019-10-25 LAB — T4, FREE: Free T4: 1.5 ng/dL (ref 0.8–1.8)

## 2019-10-28 ENCOUNTER — Telehealth: Payer: Self-pay

## 2019-10-28 NOTE — Telephone Encounter (Signed)
Per [facility] - " metoprolol tart 75 mg is not covered by pt's insurance. Prior authorization required. Requesting an alternative rx. Covered alternatives are:  Metoprolol tart 50 mg  Carvedilol 25 mg Atenolol 50 mg   Pls send updated rx to [facility].

## 2019-10-28 NOTE — Addendum Note (Signed)
Addended by: Maryla Morrow on: 10/28/2019 04:32 PM   Modules accepted: Orders

## 2019-10-29 DIAGNOSIS — M545 Low back pain: Secondary | ICD-10-CM | POA: Diagnosis not present

## 2019-10-30 NOTE — Telephone Encounter (Signed)
Received fax for PA on Metoprolol Tartrate 75 sent through cover my meds waiting on determination. - CF

## 2019-11-04 ENCOUNTER — Other Ambulatory Visit: Payer: Self-pay

## 2019-11-04 MED ORDER — AMLODIPINE BESYLATE 5 MG PO TABS: 5.0000 mg | ORAL_TABLET | Freq: Every day | ORAL | 1 refills | Status: DC

## 2019-11-04 NOTE — Telephone Encounter (Signed)
Received fax from [facility] and they denied coverage on Metoprolol 75 due to its not on the plan's drug list and patient needs to try a covered drug first. Placing in providers box for further review. - CF

## 2019-11-11 DIAGNOSIS — R768 Other specified abnormal immunological findings in serum: Secondary | ICD-10-CM | POA: Diagnosis not present

## 2019-11-11 DIAGNOSIS — E669 Obesity, unspecified: Secondary | ICD-10-CM | POA: Diagnosis not present

## 2019-11-11 DIAGNOSIS — M791 Myalgia, unspecified site: Secondary | ICD-10-CM | POA: Diagnosis not present

## 2019-11-11 DIAGNOSIS — M2550 Pain in unspecified joint: Secondary | ICD-10-CM | POA: Diagnosis not present

## 2019-11-11 DIAGNOSIS — Z6831 Body mass index (BMI) 31.0-31.9, adult: Secondary | ICD-10-CM | POA: Diagnosis not present

## 2019-11-11 DIAGNOSIS — M150 Primary generalized (osteo)arthritis: Secondary | ICD-10-CM | POA: Diagnosis not present

## 2019-11-13 DIAGNOSIS — Z923 Personal history of irradiation: Secondary | ICD-10-CM | POA: Diagnosis not present

## 2019-11-13 DIAGNOSIS — R0609 Other forms of dyspnea: Secondary | ICD-10-CM | POA: Diagnosis not present

## 2019-11-13 DIAGNOSIS — R0600 Dyspnea, unspecified: Secondary | ICD-10-CM | POA: Diagnosis not present

## 2019-11-13 DIAGNOSIS — C8232 Follicular lymphoma grade IIIa, intrathoracic lymph nodes: Secondary | ICD-10-CM | POA: Diagnosis not present

## 2019-11-13 DIAGNOSIS — Z9221 Personal history of antineoplastic chemotherapy: Secondary | ICD-10-CM | POA: Diagnosis not present

## 2019-11-13 DIAGNOSIS — I10 Essential (primary) hypertension: Secondary | ICD-10-CM | POA: Diagnosis not present

## 2019-11-13 DIAGNOSIS — Z79899 Other long term (current) drug therapy: Secondary | ICD-10-CM | POA: Diagnosis not present

## 2019-11-13 DIAGNOSIS — C8234 Follicular lymphoma grade IIIa, lymph nodes of axilla and upper limb: Secondary | ICD-10-CM | POA: Diagnosis not present

## 2019-11-13 DIAGNOSIS — I4891 Unspecified atrial fibrillation: Secondary | ICD-10-CM | POA: Diagnosis not present

## 2019-11-15 ENCOUNTER — Other Ambulatory Visit: Payer: Self-pay | Admitting: Osteopathic Medicine

## 2019-11-24 DIAGNOSIS — R197 Diarrhea, unspecified: Secondary | ICD-10-CM | POA: Diagnosis not present

## 2019-11-24 DIAGNOSIS — R1310 Dysphagia, unspecified: Secondary | ICD-10-CM | POA: Diagnosis not present

## 2019-11-24 DIAGNOSIS — K219 Gastro-esophageal reflux disease without esophagitis: Secondary | ICD-10-CM | POA: Diagnosis not present

## 2019-11-27 ENCOUNTER — Other Ambulatory Visit: Payer: Self-pay

## 2019-11-27 MED ORDER — AMLODIPINE BESYLATE 5 MG PO TABS: 5.0000 mg | ORAL_TABLET | Freq: Every day | ORAL | 1 refills | Status: DC

## 2019-12-03 DIAGNOSIS — M545 Low back pain: Secondary | ICD-10-CM | POA: Diagnosis not present

## 2019-12-03 IMAGING — DX PORTABLE LEFT KNEE - 1-2 VIEW
2 series · 2 of 2 positions shown · non-contrast
Comparison: None.

CLINICAL DATA: 83-year-old female post left knee replacement.
Initial encounter.

EXAM:
PORTABLE LEFT KNEE - 1-2 VIEW

[knee ap]
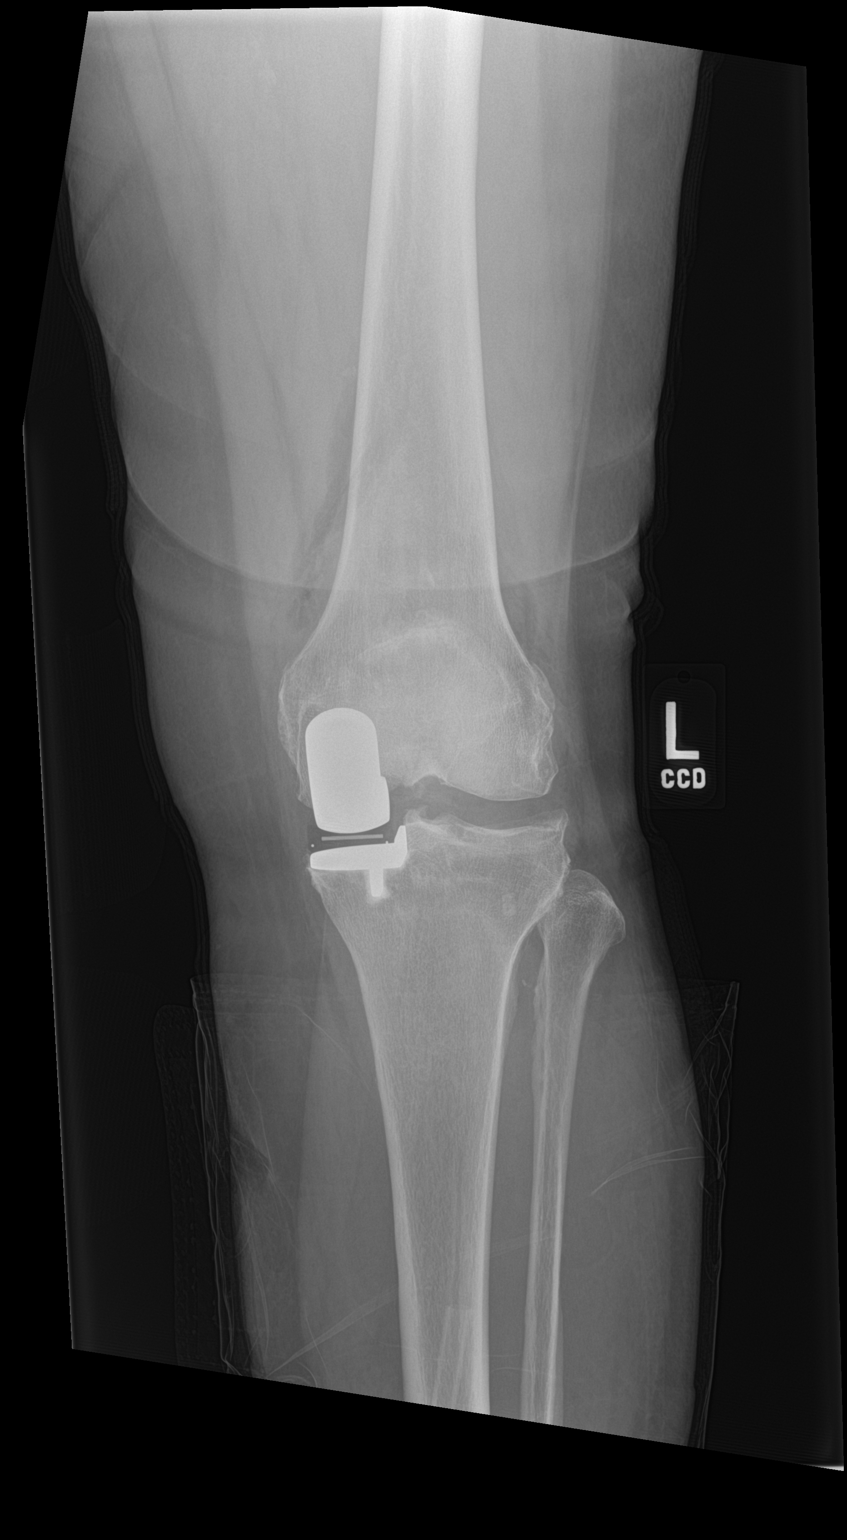

[knee lat]
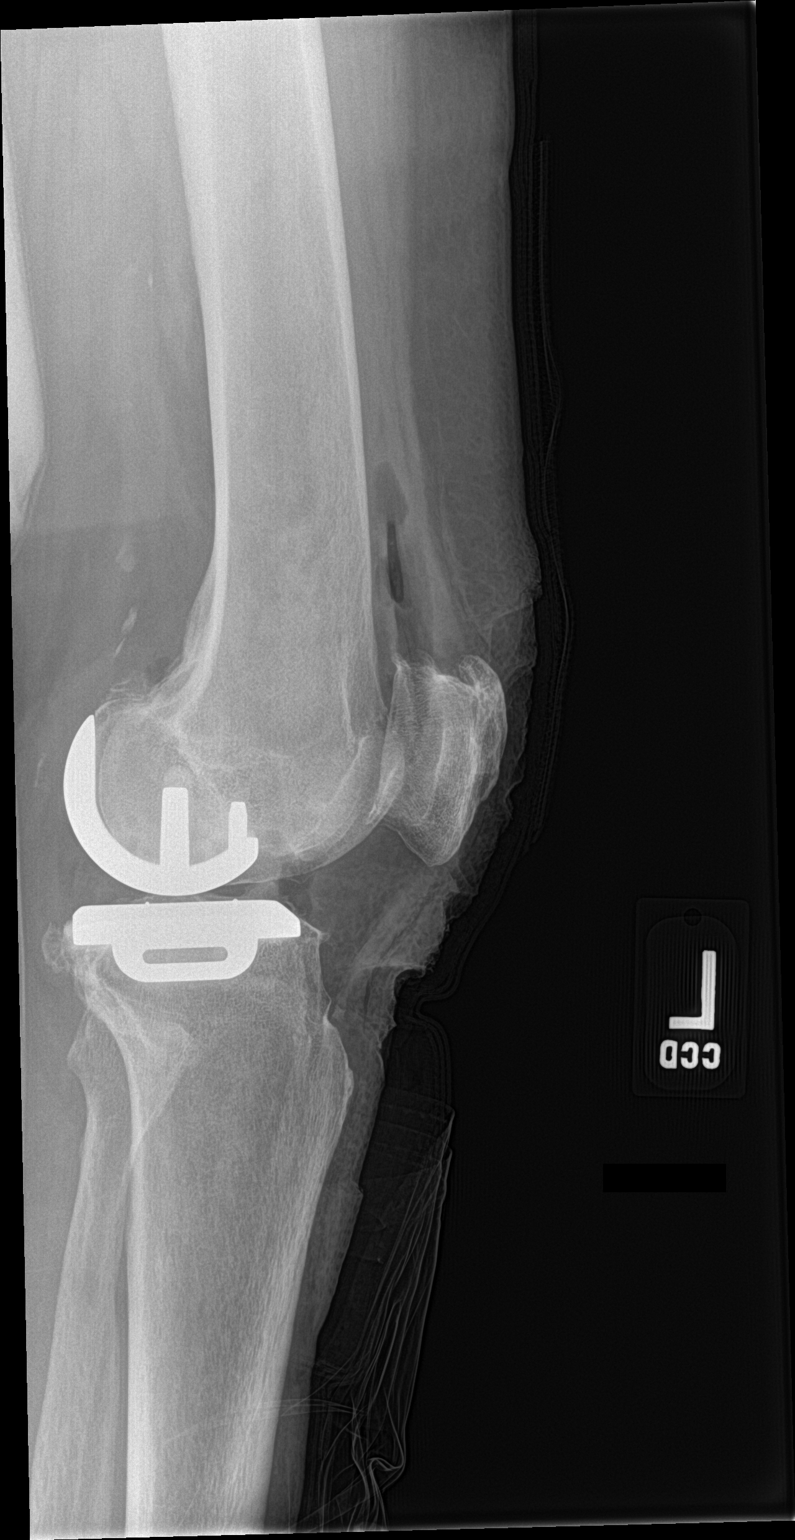

[2 of 2 positions shown; findings below may reference images not displayed]

FINDINGS: Post left medial compartment hemiarthroplasty which appears in
satisfactory position without complication noted.

Moderate patellofemoral joint degenerative changes. Mild lateral
tibiofemoral joint degenerative changes. Vascular calcifications
suspected.
IMPRESSION: 1. Post left medial compartment hemiarthroplasty which appears in
satisfactory position without complication noted.
2. Moderate patellofemoral joint degenerative changes.
3. Mild lateral tibiofemoral joint degenerative changes.

## 2019-12-04 ENCOUNTER — Encounter: Payer: Self-pay | Admitting: Osteopathic Medicine

## 2019-12-04 DIAGNOSIS — R1310 Dysphagia, unspecified: Secondary | ICD-10-CM | POA: Diagnosis not present

## 2019-12-04 DIAGNOSIS — R197 Diarrhea, unspecified: Secondary | ICD-10-CM | POA: Diagnosis not present

## 2019-12-04 DIAGNOSIS — K5730 Diverticulosis of large intestine without perforation or abscess without bleeding: Secondary | ICD-10-CM | POA: Diagnosis not present

## 2019-12-04 LAB — HM COLONOSCOPY

## 2019-12-15 ENCOUNTER — Encounter: Payer: Self-pay | Admitting: Osteopathic Medicine

## 2019-12-16 ENCOUNTER — Encounter: Payer: Self-pay | Admitting: Osteopathic Medicine

## 2019-12-17 ENCOUNTER — Encounter: Payer: Self-pay | Admitting: Osteopathic Medicine

## 2020-01-11 ENCOUNTER — Other Ambulatory Visit: Payer: Self-pay | Admitting: Osteopathic Medicine

## 2020-01-21 DIAGNOSIS — J300 Vasomotor rhinitis: Secondary | ICD-10-CM | POA: Diagnosis not present

## 2020-01-21 DIAGNOSIS — J329 Chronic sinusitis, unspecified: Secondary | ICD-10-CM | POA: Diagnosis not present

## 2020-01-21 DIAGNOSIS — Z8719 Personal history of other diseases of the digestive system: Secondary | ICD-10-CM | POA: Diagnosis not present

## 2020-01-21 DIAGNOSIS — J385 Laryngeal spasm: Secondary | ICD-10-CM | POA: Insufficient documentation

## 2020-01-21 DIAGNOSIS — Z79899 Other long term (current) drug therapy: Secondary | ICD-10-CM | POA: Diagnosis not present

## 2020-01-21 DIAGNOSIS — R09A2 Foreign body sensation, throat: Secondary | ICD-10-CM | POA: Insufficient documentation

## 2020-01-21 DIAGNOSIS — R1310 Dysphagia, unspecified: Secondary | ICD-10-CM | POA: Diagnosis not present

## 2020-01-21 DIAGNOSIS — J339 Nasal polyp, unspecified: Secondary | ICD-10-CM | POA: Diagnosis not present

## 2020-01-21 DIAGNOSIS — K219 Gastro-esophageal reflux disease without esophagitis: Secondary | ICD-10-CM | POA: Diagnosis not present

## 2020-01-21 DIAGNOSIS — R1312 Dysphagia, oropharyngeal phase: Secondary | ICD-10-CM | POA: Diagnosis not present

## 2020-01-28 DIAGNOSIS — I493 Ventricular premature depolarization: Secondary | ICD-10-CM | POA: Diagnosis not present

## 2020-02-05 DIAGNOSIS — R1310 Dysphagia, unspecified: Secondary | ICD-10-CM | POA: Diagnosis not present

## 2020-02-05 DIAGNOSIS — K224 Dyskinesia of esophagus: Secondary | ICD-10-CM | POA: Diagnosis not present

## 2020-02-05 DIAGNOSIS — K219 Gastro-esophageal reflux disease without esophagitis: Secondary | ICD-10-CM | POA: Diagnosis not present

## 2020-02-05 DIAGNOSIS — K449 Diaphragmatic hernia without obstruction or gangrene: Secondary | ICD-10-CM | POA: Diagnosis not present

## 2020-03-02 ENCOUNTER — Other Ambulatory Visit: Payer: Self-pay | Admitting: Osteopathic Medicine

## 2020-03-05 DIAGNOSIS — Z23 Encounter for immunization: Secondary | ICD-10-CM | POA: Diagnosis not present

## 2020-03-10 ENCOUNTER — Ambulatory Visit: Payer: Medicare Other

## 2020-03-26 ENCOUNTER — Emergency Department (HOSPITAL_BASED_OUTPATIENT_CLINIC_OR_DEPARTMENT_OTHER): Payer: Medicare Other

## 2020-03-26 ENCOUNTER — Emergency Department (HOSPITAL_BASED_OUTPATIENT_CLINIC_OR_DEPARTMENT_OTHER)
Admission: EM | Admit: 2020-03-26 | Discharge: 2020-03-26 | Disposition: A | Discharge status: Home / Self Care | Payer: Medicare Other | Source: Home / Self Care | Attending: Emergency Medicine | Admitting: Emergency Medicine

## 2020-03-26 ENCOUNTER — Other Ambulatory Visit: Payer: Self-pay

## 2020-03-26 ENCOUNTER — Encounter (HOSPITAL_BASED_OUTPATIENT_CLINIC_OR_DEPARTMENT_OTHER): Payer: Self-pay | Admitting: *Deleted

## 2020-03-26 DIAGNOSIS — Z7982 Long term (current) use of aspirin: Secondary | ICD-10-CM | POA: Insufficient documentation

## 2020-03-26 DIAGNOSIS — Z79899 Other long term (current) drug therapy: Secondary | ICD-10-CM | POA: Insufficient documentation

## 2020-03-26 DIAGNOSIS — N390 Urinary tract infection, site not specified: Secondary | ICD-10-CM

## 2020-03-26 DIAGNOSIS — I4891 Unspecified atrial fibrillation: Secondary | ICD-10-CM | POA: Insufficient documentation

## 2020-03-26 DIAGNOSIS — I10 Essential (primary) hypertension: Secondary | ICD-10-CM | POA: Insufficient documentation

## 2020-03-26 DIAGNOSIS — Z79890 Hormone replacement therapy (postmenopausal): Secondary | ICD-10-CM | POA: Insufficient documentation

## 2020-03-26 DIAGNOSIS — J3489 Other specified disorders of nose and nasal sinuses: Secondary | ICD-10-CM | POA: Diagnosis not present

## 2020-03-26 DIAGNOSIS — E039 Hypothyroidism, unspecified: Secondary | ICD-10-CM | POA: Insufficient documentation

## 2020-03-26 DIAGNOSIS — R531 Weakness: Secondary | ICD-10-CM | POA: Diagnosis not present

## 2020-03-26 DIAGNOSIS — J45909 Unspecified asthma, uncomplicated: Secondary | ICD-10-CM | POA: Insufficient documentation

## 2020-03-26 DIAGNOSIS — R55 Syncope and collapse: Secondary | ICD-10-CM

## 2020-03-26 DIAGNOSIS — Z96653 Presence of artificial knee joint, bilateral: Secondary | ICD-10-CM | POA: Insufficient documentation

## 2020-03-26 DIAGNOSIS — J9811 Atelectasis: Secondary | ICD-10-CM | POA: Diagnosis not present

## 2020-03-26 DIAGNOSIS — Z7951 Long term (current) use of inhaled steroids: Secondary | ICD-10-CM | POA: Insufficient documentation

## 2020-03-26 DIAGNOSIS — J80 Acute respiratory distress syndrome: Secondary | ICD-10-CM | POA: Diagnosis not present

## 2020-03-26 DIAGNOSIS — R5383 Other fatigue: Secondary | ICD-10-CM | POA: Insufficient documentation

## 2020-03-26 DIAGNOSIS — Z7952 Long term (current) use of systemic steroids: Secondary | ICD-10-CM | POA: Insufficient documentation

## 2020-03-26 LAB — COMPREHENSIVE METABOLIC PANEL
ALT: 16 U/L (ref 0–44)
AST: 22 U/L (ref 15–41)
Albumin: 3.7 g/dL (ref 3.5–5.0)
Alkaline Phosphatase: 38 U/L (ref 38–126)
Anion gap: 8 (ref 5–15)
BUN: 32 mg/dL — ABNORMAL HIGH (ref 8–23)
CO2: 25 mmol/L (ref 22–32)
Calcium: 8.9 mg/dL (ref 8.9–10.3)
Chloride: 103 mmol/L (ref 98–111)
Creatinine, Ser: 1.34 mg/dL — ABNORMAL HIGH (ref 0.44–1.00)
GFR calc Af Amer: 42 mL/min — ABNORMAL LOW (ref >60)
GFR calc non Af Amer: 37 mL/min — ABNORMAL LOW (ref >60)
Glucose, Bld: 106 mg/dL — ABNORMAL HIGH (ref 70–99)
Potassium: 3.9 mmol/L (ref 3.5–5.1)
Sodium: 136 mmol/L (ref 135–145)
Total Bilirubin: 1 mg/dL (ref 0.3–1.2)
Total Protein: 6.6 g/dL (ref 6.5–8.1)

## 2020-03-26 LAB — URINALYSIS, ROUTINE W REFLEX MICROSCOPIC
Bilirubin Urine: NEGATIVE
Glucose, UA: NEGATIVE mg/dL
Hgb urine dipstick: NEGATIVE
Ketones, ur: NEGATIVE mg/dL
Leukocytes,Ua: NEGATIVE
Nitrite: POSITIVE — AB
Protein, ur: NEGATIVE mg/dL
Specific Gravity, Urine: 1.025 (ref 1.005–1.030)
pH: 5 (ref 5.0–8.0)

## 2020-03-26 LAB — CBC WITH DIFFERENTIAL/PLATELET
Abs Immature Granulocytes: 0.01 10*3/uL (ref 0.00–0.07)
Basophils Absolute: 0.1 10*3/uL (ref 0.0–0.1)
Basophils Relative: 1 %
Eosinophils Absolute: 0.2 10*3/uL (ref 0.0–0.5)
Eosinophils Relative: 3 %
HCT: 36 % (ref 36.0–46.0)
Hemoglobin: 11.7 g/dL — ABNORMAL LOW (ref 12.0–15.0)
Immature Granulocytes: 0 %
Lymphocytes Relative: 23 %
Lymphs Abs: 1.3 10*3/uL (ref 0.7–4.0)
MCH: 28.9 pg (ref 26.0–34.0)
MCHC: 32.5 g/dL (ref 30.0–36.0)
MCV: 88.9 fL (ref 80.0–100.0)
Monocytes Absolute: 0.5 10*3/uL (ref 0.1–1.0)
Monocytes Relative: 8 %
Neutro Abs: 3.7 10*3/uL (ref 1.7–7.7)
Neutrophils Relative %: 65 %
Platelets: 214 10*3/uL (ref 150–400)
RBC: 4.05 MIL/uL (ref 3.87–5.11)
RDW: 12.4 % (ref 11.5–15.5)
WBC: 5.7 10*3/uL (ref 4.0–10.5)
nRBC: 0 % (ref 0.0–0.2)

## 2020-03-26 LAB — URINALYSIS, MICROSCOPIC (REFLEX): RBC / HPF: NONE SEEN RBC/hpf (ref 0–5)

## 2020-03-26 LAB — CBG MONITORING, ED: Glucose-Capillary: 110 mg/dL — ABNORMAL HIGH (ref 70–99)

## 2020-03-26 LAB — TROPONIN I (HIGH SENSITIVITY)
Troponin I (High Sensitivity): 10 ng/L
Troponin I (High Sensitivity): 10 ng/L (ref <18)

## 2020-03-26 MED ORDER — SODIUM CHLORIDE 0.9 % IV BOLUS
500.0000 mL | Freq: Once | INTRAVENOUS | Status: AC
  Administered 2020-03-26: 500 mL via INTRAVENOUS

## 2020-03-26 MED ORDER — CEPHALEXIN 500 MG PO CAPS: 500.0000 mg | ORAL_CAPSULE | Freq: Two times a day (BID) | ORAL | 0 refills | Status: DC

## 2020-03-26 MED ORDER — SODIUM CHLORIDE 0.9 % IV SOLN
1.0000 g | Freq: Once | INTRAVENOUS | Status: AC
  Administered 2020-03-26: 1 g via INTRAVENOUS
  Filled 2020-03-26: qty 10

## 2020-03-26 MED ORDER — SODIUM CHLORIDE 0.9 % IV SOLN
INTRAVENOUS | Status: DC | PRN
  Administered 2020-03-26: 250 mL via INTRAVENOUS

## 2020-03-26 NOTE — ED Triage Notes (Signed)
She passed out at home this afternoon. She feels fine on arrival. Fatigue.

## 2020-03-26 NOTE — ED Notes (Signed)
ED Provider at bedside. 

## 2020-03-26 NOTE — ED Provider Notes (Signed)
Medical screening examination/treatment/procedure(s) were conducted as a shared visit with non-physician practitioner(s) and myself.  I personally evaluated the patient during the encounter. Briefly, the patient is a 85 y.o. female with history of hypertension, high cholesterol, atrial fibrillation, Mnire's disease who presents to the ED with generalized weakness.  Patient with unremarkable vitals.  No fever.  Has had several episodes of weakness, difficulty focusing.  Had another episode this morning that resolved on its own.  Then had another episode where she felt difficult to focus and, lightheadedness while sitting in and fell back maybe lost consciousness for 30 seconds.  Did not appear to hit her head.  Patient felt like she was going to pass out.  Overall she is asymptomatic now.  No chest pain, no shortness of breath.  Has not been sleeping well.  Overall neurologically she is intact.  She is well-appearing.  Could be vasovagal or orthostatic event.  Seems less likely that this was due to an arrhythmia problem given prodrome.  EKG shows sinus rhythm.  Unchanged from prior EKG.  Will get head CT, labs including urinalysis and chest x-ray.  Will evaluate for electrolyte abnormality, anemia.  Assuming that patient is symptom-free and lab work unremarkable believe she would be okay for discharge.  This chart was dictated using voice recognition software.  Despite best efforts to proofread,  errors can occur which can change the documentation meaning.     EKG Interpretation  Date/Time:  Friday March 26 2020 14:20:26 EDT Ventricular Rate:  75 PR Interval:    QRS Duration: 152 QT Interval:  459 QTC Calculation: 513 R Axis:   -11 Text Interpretation: Sinus rhythm Prolonged PR interval Right bundle branch block Anterior infarct, age indeterminate Confirmed by Lennice Sites [phone]) on 03/26/2020 2:23:45 PM           Lennice Sites, DO 03/26/20 1451

## 2020-03-26 NOTE — ED Notes (Signed)
Pt. Has walked to restroom several times with  No difficulty.  Pt. Reports no dizziness when walking.

## 2020-03-26 NOTE — ED Notes (Signed)
Pt. Reports at around 12:30 today she felt a blank feeling and she was jerking per her daughter.  Pt. Daughter said the Pt. Was "out" for about 30 seconds and then she came to and put her clothes on and they came to ED.  Pt. Ate on way to ED 1/2 a taco.   Pt. Did eat breakfast today 2 fried eggs and rice.

## 2020-03-26 NOTE — ED Provider Notes (Signed)
[facility] EMERGENCY DEPARTMENT Provider Note   CSN: [id number] Arrival date & time: 03/26/20  1341     History No chief complaint on file.   [name] is a 85 y.o. female possible history of paroxysmal A. fib, hypertension, hypothyroidism who presents for evaluation of syncopal episode that occurred about 1 PM this afternoon.  Patient reports that she was at home and started getting "a feeling -like losing touch with reality."  She states she feels lightheaded like she was going to pass out.  She laid down in the feeling seem to pass.  She went downstairs to hold some close and she had an episode again when she laid back and laid her head back.  Daughter witnessed the episode and and stated that patient cast and fell back against the couch.  She reports that she had positive LOC for about 30 seconds.  After 30 seconds, she came back around.  She was tired but was alert.  Daughter brought her to the emergency department.  On ED arrival, patient states she feels tired but denies any other symptoms.  She states she has had this sensation for about last 6 weeks.  It is intermittently occurred.  She states she notices it more when she is standing from a sitting position.  She states that she has never passed out from it before.  She had no associated chest pain.  She has not been sick recently with fever, cough, chills.  She did eat breakfast this morning.  She denies any difficulty breathing, abdominal pain, numbness/weakness of her arms or legs.  The history is provided by the patient.       Past Medical History:  Diagnosis Date  . Arthritis   . Asthma   . Atrial fibrillation (HCC)    paroxysmal , addressed  with metoprolol   . Chronic sinusitis   . Complication of anesthesia   . Dyspnea    sob on exertion, reports at her pre-op today 09-13-2018 that her cardiologist has planned for her to do a ECHO for evaluation   . Family history of adverse reaction to anesthesia    sister  is hard to wake up from Anesthesia  . Follicular lymphoma ([place])    received Chemotherapy (2 years ago)  and radiation (last 12/2017)  . Hyperlipidemia   . Hypertension   . Hypothyroidism   . Meniere's disease   . PONV (postoperative nausea and vomiting)   . Primary localized osteoarthritis of left knee 09/24/2018  . Primary localized osteoarthritis of right knee 06/18/2018  . Reflux     Patient Active Problem List   Diagnosis Date Noted  . Shingles 10/24/2019  . MTHFR gene mutation ([place]) 10/09/2018  . Primary localized osteoarthritis of left knee 09/24/2018  . S/P left unicompartmental knee replacement 09/24/2018  . Fatigue 09/03/2018  . Myalgia 09/03/2018  . Vertigo 09/03/2018  . Sebaceous cyst 09/03/2018  . Callus of foot 09/03/2018  . Primary localized osteoarthritis of right knee 06/18/2018  . Status post right partial knee replacement 06/18/2018  . Nasal polyposis 05/16/2018  . Class 1 obesity due to excess calories with serious comorbidity and body mass index (BMI) of 30.0 to 30.9 in adult 07/16/2017  . DDD (degenerative disc disease), cervical 07/16/2017  . GERD (gastroesophageal reflux disease) 07/16/2017  . Other hyperlipidemia 09/27/2016  . Paroxysmal atrial fibrillation ([place]) 09/27/2016  . Benign essential hypertension 09/15/2015  . Follicular lymphoma ([place]) 12/18/2014  . Lymphoma ([place]) 07/28/2014  . Hypothyroidism  01/14/2014  . Acquired trigger finger 08/05/2013  . Carpal tunnel syndrome on both sides 06/30/2013  . Dyspnea 12/25/2012    Past Surgical History:  Procedure Laterality Date  . ABDOMINAL HYSTERECTOMY    . BONE MARROW BIOPSY  06/16/2014   at [place]     bilateral  . CHOLECYSTECTOMY    . KNEE ARTHROSCOPY    . PARTIAL KNEE ARTHROPLASTY Right 06/18/2018   Procedure: UNICOMPARTMENTAL KNEE;  Surgeon: Marchia Bond, MD;  Location: [facility] ORS;  Service: Orthopedics;  Laterality: Right;  . PARTIAL KNEE ARTHROPLASTY Left  09/24/2018   Procedure: UNICOMPARTMENTAL KNEE;  Surgeon: Marchia Bond, MD;  Location: [facility];  Service: Orthopedics;  Laterality: Left;     OB History   No obstetric history on file.     Family History  Problem Relation Age of Onset  . Heart disease Other   . Skin cancer Mother   . Stroke Mother   . Prostate cancer Father   . High blood pressure Father   . Heart attack Father     Social History   Tobacco Use  . Smoking status: Never Smoker  . Smokeless tobacco: Never Used  Vaping Use  . Vaping Use: Never used  Substance Use Topics  . Alcohol use: No  . Drug use: No    Home Medications Prior to Admission medications   Medication Sig Start Date End Date Taking? Authorizing Provider  acyclovir ointment (ZOVIRAX) 5 % Apply 1 application topically daily as needed (for cold sores). 09/03/18   [name], DO  amLODipine (NORVASC) 5 MG tablet Take 1 tablet (5 mg total) by mouth daily. 11/27/19   [name], DO  aspirin EC 81 MG tablet Take 81 mg by mouth daily.    [provider]  baclofen (LIORESAL) 10 MG tablet Take 1 tablet (10 mg total) by mouth 3 (three) times daily as needed (muscle spasms). 03/10/19   [name], DO  Calcium Citrate-Vitamin D (CALCIUM CITRATE+D3 PO) Take 1 tablet by mouth every evening.    [provider]  cephALEXin (KEFLEX) 500 MG capsule Take 1 capsule (500 mg total) by mouth 2 (two) times daily for 7 days. 03/26/20 04/02/20  Volanda Napoleon, PA-C  flecainide (TAMBOCOR) 50 MG tablet Take by mouth. 06/18/19   [provider]  fluconazole (DIFLUCAN) 150 MG tablet Take 150 mg by mouth once. 07/10/19   [provider]  fluticasone (FLOVENT HFA) 110 MCG/ACT inhaler Inhale 2 puffs into the lungs 2 (two) times daily as needed (for shortness of breath or wheezing). 09/03/18   [name], DO  gabapentin (NEURONTIN) 100 MG capsule  03/11/19   [provider]  hydrochlorothiazide (HYDRODIURIL)  25 MG tablet Take 1 tablet (25 mg total) by mouth daily. 09/16/19   [name], DO  HYDROcodone-acetaminophen (NORCO) 10-325 MG tablet Take 1 tablet by mouth every 6 (six) hours as needed. 09/24/18   Marchia Bond, MD  hydroxychloroquine (PLAQUENIL) 200 MG tablet  05/25/19   [provider]  ipratropium (ATROVENT) 0.03 % nasal spray Place into the nose. 07/08/19 07/07/20  [provider]  isosorbide mononitrate (IMDUR) 30 MG 24 hr tablet Take 1 tablet (30 mg total) by mouth daily. 09/03/18   [name], DO  levothyroxine (SYNTHROID) 112 MCG tablet TAKE 1 TABLET BY MOUTH  DAILY BEFORE BREAKFAST 01/13/20   [name], DO  losartan (COZAAR) 100 MG tablet Take 1 tablet (100 mg total) by mouth at bedtime. 05/05/19  [name], DO  Magnesium (V-R MAGNESIUM) 250 MG TABS Take by mouth.    [provider]  meclizine (ANTIVERT) 25 MG tablet Take 1 tablet (25 mg total) by mouth 3 (three) times daily as needed for dizziness. 10/10/18   [name], DO  meloxicam (MOBIC) 15 MG tablet Take 15 mg by mouth daily as needed for pain.    [provider]  methylPREDNISolone (MEDROL) 4 MG tablet Take by mouth as directed. 07/23/19   [provider]  methylPREDNISolone acetate (DEPO-MEDROL) 40 MG/ML injection SMARTSIG:2 Milliliter(s) IM Once PRN 07/23/19   [provider]  Metoprolol Tartrate 75 MG TABS Take 75 mg by mouth 2 (two) times daily. 10/24/19   [name], DO  omeprazole (PRILOSEC) 40 MG capsule TAKE 1 CAPSULE BY MOUTH IN  THE MORNING AND MAY REPEAT  DOSE IN THE EVENING AS  NEEDED FOR ACID REFLUX /  INDIGESTION 03/02/20   [name], DO  ondansetron (ZOFRAN) 4 MG tablet Take 1 tablet (4 mg total) by mouth every 8 (eight) hours as needed for nausea or vomiting. 09/03/18   [name], DO  potassium chloride (KLOR-CON) 10 MEQ tablet Take by mouth. 07/29/14   [provider]  pravastatin (PRAVACHOL) 80  MG tablet TAKE 1 TABLET BY MOUTH AT  BEDTIME 11/17/19   [name], DO  sennosides-docusate sodium (SENOKOT-S) 8.6-50 MG tablet Take 2 tablets by mouth daily. Patient taking differently: Take 2 tablets by mouth daily as needed (constipation.).  06/18/18   Marchia Bond, MD  sulfamethoxazole-trimethoprim (BACTRIM DS) 800-160 MG tablet Take 1 tablet by mouth 2 (two) times daily. 07/10/19   Breeback, Royetta Car, PA-C  tetrahydrozoline (VISINE) 0.05 % ophthalmic solution Place 2 drops into both eyes daily as needed (for dry eyes).    [provider]  triamcinolone (NASACORT ALLERGY 24HR) 55 MCG/ACT AERO nasal inhaler Place 2 sprays into the nose daily. Patient taking differently: Place 2 sprays into the nose daily as needed (allergies.).  09/03/18   [name], DO    Allergies    Patient has no known allergies.  Review of Systems   Review of Systems  Constitutional: Positive for fatigue. Negative for fever.  Respiratory: Negative for cough and shortness of breath.   Cardiovascular: Negative for chest pain.  Gastrointestinal: Negative for abdominal pain, nausea and vomiting.  Genitourinary: Negative for dysuria and hematuria.  Neurological: Positive for syncope. Negative for headaches.  All other systems reviewed and are negative.   Physical Exam Updated Vital Signs BP (!) 184/75 (BP Location: Right Arm)   Pulse 66   Temp 98 F (36.7 C) (Oral)   Resp 20   Ht '5\' 5"'  (1.651 m)   Wt 81.6 kg   SpO2 100%   BMI 29.95 kg/m   Physical Exam Vitals and nursing note reviewed.  Constitutional:      Appearance: Normal appearance. She is well-developed.  HENT:     Head: Normocephalic and atraumatic.  Eyes:     General: Lids are normal.     Conjunctiva/sclera: Conjunctivae normal.     Pupils: Pupils are equal, round, and reactive to light.     Comments: PERRL. EOMs intact. No nystagmus. No neglect.   Cardiovascular:     Rate and Rhythm: Normal rate and regular rhythm.      Pulses: Normal pulses.     Heart sounds: Normal heart sounds. No murmur heard.  No friction rub. No gallop.   Pulmonary:     Effort: Pulmonary effort  is normal.     Breath sounds: Normal breath sounds.     Comments: Lungs clear to auscultation bilaterally.  Symmetric chest rise.  No wheezing, rales, rhonchi. Abdominal:     Palpations: Abdomen is soft. Abdomen is not rigid.     Tenderness: There is no abdominal tenderness. There is no guarding.     Comments: Abdomen is soft, non-distended, non-tender. No rigidity, No guarding. No peritoneal signs.  Musculoskeletal:        General: Normal range of motion.     Cervical back: Full passive range of motion without pain.  Skin:    General: Skin is warm and dry.     Capillary Refill: Capillary refill takes less than 2 seconds.  Neurological:     Mental Status: She is alert and oriented to person, place, and time.     Comments: Cranial nerves III-XII intact Follows commands, Moves all extremities  5/5 strength to BUE and BLE  Sensation intact throughout all major nerve distributions No pronator drift. No slurred speech. No facial droop.   Psychiatric:        Speech: Speech normal.     ED Results / Procedures / Treatments   Labs (all labs ordered are listed, but only abnormal results are displayed) Labs Reviewed  COMPREHENSIVE METABOLIC PANEL - Abnormal; Notable for the following components:      Result Value   Glucose, Bld 106 (*)    BUN 32 (*)    Creatinine, Ser 1.34 (*)    GFR calc non Af Amer 37 (*)    GFR calc Af Amer 42 (*)    All other components within normal limits  CBC WITH DIFFERENTIAL/PLATELET - Abnormal; Notable for the following components:   Hemoglobin 11.7 (*)    All other components within normal limits  URINALYSIS, ROUTINE W REFLEX MICROSCOPIC - Abnormal; Notable for the following components:   Nitrite POSITIVE (*)    All other components within normal limits  URINALYSIS, MICROSCOPIC (REFLEX) - Abnormal;  Notable for the following components:   Bacteria, UA MANY (*)    All other components within normal limits  CBG MONITORING, ED - Abnormal; Notable for the following components:   Glucose-Capillary 110 (*)    All other components within normal limits  CBG MONITORING, ED  TROPONIN I (HIGH SENSITIVITY)  TROPONIN I (HIGH SENSITIVITY)    EKG EKG Interpretation  Date/Time:  Friday March 26 2020 14:20:26 EDT Ventricular Rate:  75 PR Interval:    QRS Duration: 152 QT Interval:  459 QTC Calculation: 513 R Axis:   -11 Text Interpretation: Sinus rhythm Prolonged PR interval Right bundle branch block Anterior infarct, age indeterminate Confirmed by Lennice Sites [phone]) on 03/26/2020 2:23:45 PM   Radiology CT Head Wo Contrast  Result Date: 03/26/2020 CLINICAL DATA:  Syncope at the EXAM: CT HEAD WITHOUT CONTRAST TECHNIQUE: Contiguous axial images were obtained from the base of the skull through the vertex without intravenous contrast. COMPARISON:  None. FINDINGS: Brain: No evidence of acute territorial infarction, hemorrhage, hydrocephalus,extra-axial collection or mass lesion/mass effect. The ventricles are normal in size and contour. There is mild low-attenuation changes in the deep white matter consistent with small vessel ischemia. Vascular: No hyperdense vessel or unexpected calcification. Skull: The skull is intact. No fracture or focal lesion identified. Sinuses/Orbits: Mucosal thickening seen within the ethmoid air cells and maxillary sinuses. The the the orbits and globes intact. Other: None IMPRESSION: No acute intracranial abnormality. Electronically Signed   By: Ebony Cargo.D.  On: 03/26/2020 15:19   DG Chest Portable 1 View  Result Date: 03/26/2020 CLINICAL DATA:  Weakness EXAM: PORTABLE CHEST 1 VIEW COMPARISON:  Radiograph 03/03/2019 FINDINGS: Some coarsened interstitial and bronchitic features are similar to priors. Some hazy opacity towards the lung bases likely reflecting  some atelectatic change and overlying tissue. Mild central vascular congestion without features of frank edema. The aorta is calcified. The remaining cardiomediastinal contours are unremarkable. No acute osseous or soft tissue abnormality. Degenerative changes are present in the imaged spine and shoulders. Telemetry leads overlie the chest. IMPRESSION: 1. Basilar atelectasis. 2. Chronic bronchitic features. 3. Mild central vascular congestion without features of frank edema. 4.  Aortic Atherosclerosis (ICD10-I70.0). Electronically Signed   By: Lovena Le M.D.   On: 03/26/2020 16:21    Procedures Procedures (including critical care time)  Medications Ordered in ED Medications  0.9 %  sodium chloride infusion ( Intravenous Stopped 03/26/20 1841)  sodium chloride 0.9 % bolus 500 mL ( Intravenous Stopped 03/26/20 1733)  cefTRIAXone (ROCEPHIN) 1 g in sodium chloride 0.9 % 100 mL IVPB (0 g Intravenous Stopped 03/26/20 1836)    ED Course  I have reviewed the triage vital signs and the nursing notes.  Pertinent labs & imaging results that were available during my care of the patient were reviewed by me and considered in my medical decision making (see chart for details).    MDM Rules/Calculators/A&P                          85 year old female who presents for evaluation of syncopal episode that occurred this morning.  She reports feeling lightheaded or having an estimated 32nd syncopal episode.  No chest pain.  She reports that this has been occurring over the last 6 weeks but she has never passed out from it.  Currently denies any symptoms other than feeling tired.  On initial arrival, she is afebrile, nontoxic-appearing.  Signs are stable.  On exam, no neuro deficits.  Benign abdominal and lung exam.  Plan to check orthostatics, basic labs.  POC CBG is 110. CBC shows no evidence of leukocytosis. Hgb stable at 11.7.  CMP shows BUN of 32, creatinine of 1.34.  Initial troponin is  negative.  Orthostatic VS for the past 24 hrs:  BP- Lying Pulse- Lying BP- Sitting Pulse- Sitting BP- Standing at 0 minutes Pulse- Standing at 0 minutes  03/26/20 1537 140/65 64 175/77 65 163/71 67    She does follow with an EP (Dr. Remus Blake) who she has seen for PVCs.  She had a ZIO monitor that revealed 10% burden of PVCs.  She has previously been on Flecanide.   Delta troponin is negative.  UTI does show nitrites, bacteria.  She has not had any dysuria but has had increased urinary frequency.  Patient has ambulated to the bathroom multiple times in the ED without any dizziness.  Patient states she has not had any more symptoms here in the ED.  I discussed with her following up regarding her symptoms with her cardiologist.  At this time, given reassuring EKG, 2 - troponins as well as reassuring exam, feel that patient is stable for discharge.  We will plan to treat her UTI.  Patient given dose of Rocephin here in the ED.  We will plan to send her home with antibiotics. At this time, patient exhibits no emergent life-threatening condition that require further evaluation in ED or admission. Patient had ample opportunity for questions  and discussion. All patient's questions were answered with full understanding. Strict return precautions discussed. Patient expresses understanding and agreement to plan.   Portions of this note were generated with [employer]. Dictation errors may occur despite best attempts at proofreading.  Final Clinical Impression(s) / ED Diagnoses Final diagnoses:  Syncope, unspecified syncope type  Urinary tract infection without hematuria, site unspecified    Rx / DC Orders ED Discharge Orders         Ordered    cephALEXin (KEFLEX) 500 MG capsule  2 times daily        03/26/20 1915           Desma Mcgregor 03/26/20 [place], [place], DO 03/27/20 [phone]

## 2020-03-26 NOTE — Discharge Instructions (Signed)
As we discussed, your work-up today looked reassuring.  It did show signs of a urinary tract infection which we will plan to treat with antibiotics.  As we discussed, you are mildly dehydrated.  Your kidney function needs to be rechecked by your primary care doctor in 1 to 2 weeks.  Call your cardiology doctor and tell him about this visit.  Please follow-up with him.  Return the emergency department for any episodes of loss of consciousness, chest pain, difficulty breathing, vomiting or any other worsening or concerning symptoms.

## 2020-03-28 ENCOUNTER — Emergency Department (HOSPITAL_BASED_OUTPATIENT_CLINIC_OR_DEPARTMENT_OTHER): Payer: Medicare Other

## 2020-03-28 ENCOUNTER — Other Ambulatory Visit: Payer: Self-pay

## 2020-03-28 ENCOUNTER — Encounter (HOSPITAL_BASED_OUTPATIENT_CLINIC_OR_DEPARTMENT_OTHER): Payer: Self-pay | Admitting: Emergency Medicine

## 2020-03-28 ENCOUNTER — Inpatient Hospital Stay (HOSPITAL_BASED_OUTPATIENT_CLINIC_OR_DEPARTMENT_OTHER)
Admission: EM | Admit: 2020-03-28 | Discharge: 2020-03-31 | DRG: 244 | Disposition: A | Discharge status: Home / Self Care | Payer: Medicare Other | Attending: Internal Medicine | Admitting: Internal Medicine

## 2020-03-28 DIAGNOSIS — E785 Hyperlipidemia, unspecified: Secondary | ICD-10-CM | POA: Diagnosis present

## 2020-03-28 DIAGNOSIS — E876 Hypokalemia: Secondary | ICD-10-CM | POA: Diagnosis present

## 2020-03-28 DIAGNOSIS — I10 Essential (primary) hypertension: Secondary | ICD-10-CM | POA: Diagnosis not present

## 2020-03-28 DIAGNOSIS — Z7982 Long term (current) use of aspirin: Secondary | ICD-10-CM

## 2020-03-28 DIAGNOSIS — E669 Obesity, unspecified: Secondary | ICD-10-CM | POA: Diagnosis present

## 2020-03-28 DIAGNOSIS — E6609 Other obesity due to excess calories: Secondary | ICD-10-CM

## 2020-03-28 DIAGNOSIS — H8109 Meniere's disease, unspecified ear: Secondary | ICD-10-CM | POA: Diagnosis present

## 2020-03-28 DIAGNOSIS — Z20822 Contact with and (suspected) exposure to covid-19: Secondary | ICD-10-CM | POA: Diagnosis not present

## 2020-03-28 DIAGNOSIS — I495 Sick sinus syndrome: Secondary | ICD-10-CM | POA: Diagnosis not present

## 2020-03-28 DIAGNOSIS — I499 Cardiac arrhythmia, unspecified: Secondary | ICD-10-CM

## 2020-03-28 DIAGNOSIS — E7849 Other hyperlipidemia: Secondary | ICD-10-CM | POA: Diagnosis present

## 2020-03-28 DIAGNOSIS — I351 Nonrheumatic aortic (valve) insufficiency: Secondary | ICD-10-CM | POA: Diagnosis not present

## 2020-03-28 DIAGNOSIS — Z79899 Other long term (current) drug therapy: Secondary | ICD-10-CM

## 2020-03-28 DIAGNOSIS — I442 Atrioventricular block, complete: Secondary | ICD-10-CM | POA: Diagnosis present

## 2020-03-28 DIAGNOSIS — R4182 Altered mental status, unspecified: Secondary | ICD-10-CM | POA: Diagnosis not present

## 2020-03-28 DIAGNOSIS — Z8572 Personal history of non-Hodgkin lymphomas: Secondary | ICD-10-CM

## 2020-03-28 DIAGNOSIS — K219 Gastro-esophageal reflux disease without esophagitis: Secondary | ICD-10-CM | POA: Diagnosis present

## 2020-03-28 DIAGNOSIS — M1991 Primary osteoarthritis, unspecified site: Secondary | ICD-10-CM | POA: Diagnosis not present

## 2020-03-28 DIAGNOSIS — R55 Syncope and collapse: Secondary | ICD-10-CM | POA: Diagnosis not present

## 2020-03-28 DIAGNOSIS — Z6830 Body mass index (BMI) 30.0-30.9, adult: Secondary | ICD-10-CM

## 2020-03-28 DIAGNOSIS — Z79890 Hormone replacement therapy (postmenopausal): Secondary | ICD-10-CM

## 2020-03-28 DIAGNOSIS — I493 Ventricular premature depolarization: Secondary | ICD-10-CM | POA: Diagnosis present

## 2020-03-28 DIAGNOSIS — C8290 Follicular lymphoma, unspecified, unspecified site: Secondary | ICD-10-CM | POA: Diagnosis present

## 2020-03-28 DIAGNOSIS — Z959 Presence of cardiac and vascular implant and graft, unspecified: Secondary | ICD-10-CM

## 2020-03-28 DIAGNOSIS — J9811 Atelectasis: Secondary | ICD-10-CM | POA: Diagnosis not present

## 2020-03-28 DIAGNOSIS — I455 Other specified heart block: Secondary | ICD-10-CM

## 2020-03-28 DIAGNOSIS — I340 Nonrheumatic mitral (valve) insufficiency: Secondary | ICD-10-CM | POA: Diagnosis not present

## 2020-03-28 DIAGNOSIS — E039 Hypothyroidism, unspecified: Secondary | ICD-10-CM | POA: Diagnosis present

## 2020-03-28 DIAGNOSIS — I441 Atrioventricular block, second degree: Secondary | ICD-10-CM | POA: Diagnosis present

## 2020-03-28 DIAGNOSIS — J90 Pleural effusion, not elsewhere classified: Secondary | ICD-10-CM | POA: Diagnosis not present

## 2020-03-28 DIAGNOSIS — J45909 Unspecified asthma, uncomplicated: Secondary | ICD-10-CM | POA: Diagnosis not present

## 2020-03-28 DIAGNOSIS — I480 Paroxysmal atrial fibrillation: Secondary | ICD-10-CM | POA: Diagnosis not present

## 2020-03-28 DIAGNOSIS — Z96653 Presence of artificial knee joint, bilateral: Secondary | ICD-10-CM | POA: Diagnosis present

## 2020-03-28 LAB — BASIC METABOLIC PANEL
Anion gap: 10 (ref 5–15)
BUN: 14 mg/dL (ref 8–23)
CO2: 28 mmol/L (ref 22–32)
Calcium: 8.9 mg/dL (ref 8.9–10.3)
Chloride: 97 mmol/L — ABNORMAL LOW (ref 98–111)
Creatinine, Ser: 1.02 mg/dL — ABNORMAL HIGH (ref 0.44–1.00)
GFR calc Af Amer: 59 mL/min — ABNORMAL LOW (ref >60)
GFR calc non Af Amer: 51 mL/min — ABNORMAL LOW (ref >60)
Glucose, Bld: 107 mg/dL — ABNORMAL HIGH (ref 70–99)
Potassium: 3.6 mmol/L (ref 3.5–5.1)
Sodium: 135 mmol/L (ref 135–145)

## 2020-03-28 LAB — HEPATIC FUNCTION PANEL
ALT: 17 U/L (ref 0–44)
AST: 19 U/L (ref 15–41)
Albumin: 3.8 g/dL (ref 3.5–5.0)
Alkaline Phosphatase: 35 U/L — ABNORMAL LOW (ref 38–126)
Bilirubin, Direct: 0.2 mg/dL (ref 0.0–0.2)
Indirect Bilirubin: 0.7 mg/dL (ref 0.3–0.9)
Total Bilirubin: 0.9 mg/dL (ref 0.3–1.2)
Total Protein: 6.9 g/dL (ref 6.5–8.1)

## 2020-03-28 LAB — CBC WITH DIFFERENTIAL/PLATELET
Abs Immature Granulocytes: 0.04 10*3/uL (ref 0.00–0.07)
Basophils Absolute: 0 10*3/uL (ref 0.0–0.1)
Basophils Relative: 0 %
Eosinophils Absolute: 0.1 10*3/uL (ref 0.0–0.5)
Eosinophils Relative: 2 %
HCT: 37.8 % (ref 36.0–46.0)
Hemoglobin: 12.5 g/dL (ref 12.0–15.0)
Immature Granulocytes: 1 %
Lymphocytes Relative: 15 %
Lymphs Abs: 1 10*3/uL (ref 0.7–4.0)
MCH: 28.9 pg (ref 26.0–34.0)
MCHC: 33.1 g/dL (ref 30.0–36.0)
MCV: 87.5 fL (ref 80.0–100.0)
Monocytes Absolute: 0.4 10*3/uL (ref 0.1–1.0)
Monocytes Relative: 7 %
Neutro Abs: 5 10*3/uL (ref 1.7–7.7)
Neutrophils Relative %: 75 %
Platelets: 220 10*3/uL (ref 150–400)
RBC: 4.32 MIL/uL (ref 3.87–5.11)
RDW: 12.2 % (ref 11.5–15.5)
WBC: 6.7 10*3/uL (ref 4.0–10.5)
nRBC: 0 % (ref 0.0–0.2)

## 2020-03-28 LAB — MAGNESIUM: Magnesium: 2.1 mg/dL (ref 1.7–2.4)

## 2020-03-28 LAB — TROPONIN I (HIGH SENSITIVITY)
Troponin I (High Sensitivity): 10 ng/L (ref <18)
Troponin I (High Sensitivity): 9 ng/L (ref <18)

## 2020-03-28 LAB — LIPASE, BLOOD: Lipase: 41 U/L (ref 11–51)

## 2020-03-28 LAB — URINALYSIS, ROUTINE W REFLEX MICROSCOPIC
Bilirubin Urine: NEGATIVE
Glucose, UA: NEGATIVE mg/dL
Hgb urine dipstick: NEGATIVE
Ketones, ur: NEGATIVE mg/dL
Leukocytes,Ua: NEGATIVE
Nitrite: NEGATIVE
Protein, ur: NEGATIVE mg/dL
Specific Gravity, Urine: 1.011 (ref 1.005–1.030)
pH: 5 (ref 5.0–8.0)

## 2020-03-28 LAB — MRSA PCR SCREENING: MRSA by PCR: NEGATIVE

## 2020-03-28 LAB — TSH: TSH: 1.162 u[IU]/mL (ref 0.350–4.500)

## 2020-03-28 LAB — SARS CORONAVIRUS 2 BY RT PCR (HOSPITAL ORDER, PERFORMED IN ~~LOC~~ HOSPITAL LAB): SARS Coronavirus 2: NEGATIVE

## 2020-03-28 MED ORDER — SODIUM CHLORIDE 0.9% FLUSH
3.0000 mL | Freq: Two times a day (BID) | INTRAVENOUS | Status: DC
  Administered 2020-03-29 – 2020-03-30 (×3): 3 mL via INTRAVENOUS

## 2020-03-28 MED ORDER — ONDANSETRON HCL 4 MG PO TABS: 4.0000 mg | ORAL_TABLET | Freq: Four times a day (QID) | ORAL | Status: DC | PRN

## 2020-03-28 MED ORDER — ONDANSETRON HCL 4 MG/2ML IJ SOLN
4.0000 mg | Freq: Once | INTRAMUSCULAR | Status: AC
  Administered 2020-03-28: 4 mg via INTRAVENOUS
  Filled 2020-03-28: qty 2

## 2020-03-28 MED ORDER — ACETAMINOPHEN 650 MG RE SUPP: 650.0000 mg | Freq: Four times a day (QID) | RECTAL | Status: DC | PRN

## 2020-03-28 MED ORDER — ONDANSETRON HCL 4 MG/2ML IJ SOLN: 4.0000 mg | Freq: Four times a day (QID) | INTRAMUSCULAR | Status: DC | PRN

## 2020-03-28 MED ORDER — ACETAMINOPHEN 325 MG PO TABS
650.0000 mg | ORAL_TABLET | Freq: Four times a day (QID) | ORAL | Status: DC | PRN
  Administered 2020-03-29: 650 mg via ORAL
  Filled 2020-03-28: qty 2

## 2020-03-28 MED ORDER — ENOXAPARIN SODIUM 40 MG/0.4ML ~~LOC~~ SOLN
40.0000 mg | Freq: Every day | SUBCUTANEOUS | Status: DC
  Administered 2020-03-29: 40 mg via SUBCUTANEOUS
  Filled 2020-03-28 (×2): qty 0.4

## 2020-03-28 NOTE — Progress Notes (Addendum)
Patient discussed with ER staff, admitted with syncopal episodes. During symptoms in the ER telemetry was captured (see media section photo dated 03/28/20 13:39) showing an episode of what looks to be transient complete heart block, followed by a ventricular escape rhythm then resumption of SR. Patient is being transferred to medicine team at [facility], cardiology will follow. WIll need to hold beta blocker and flecanide, see how rhythm does with washout. CHeck flecanide level   Carlyle Dolly MD

## 2020-03-28 NOTE — H&P (Addendum)
History and Physical    [name] GYF:[id number] DOB: 1934/11/16 DOA: 03/28/2020  PCP: [name], DO  Patient coming from: [facility]  I have personally briefly reviewed patient's old medical records in [place]  Chief Complaint: Syncope  HPI: [name] is a 85 y.o. female with medical history significant of hypertension, hyperlipidemia, hypothyroidism, GERD, Mnire's disease, A. fib/PVCs, obesity presents to [place] with recurrent syncopal episodes.  Patient tells me that she has multiple passing out episodes recently which is getting worse, associated with dizziness, nausea, bowel urgency.  Reports that each episode last for 10 to 20 seconds, associated with jerks and tinnitus.  She went to ER 2 days ago, work-up was unremarkable including CT head therefore she discharged home and this morning she went to [facility] for evaluation of worsening syncopal episodes. She had 5 syncopal episodes this am.  Daughter states that patient was sitting down when all of a sudden her eyes rolled to the side and she was unresponsive.  Patient denies headache, change in vision, head trauma, slurred speech, facial droop, chest pain, shortness of breath, palpitation, leg swelling, orthopnea, PND, abdominal pain, fever, chills, cough, congestion, urinary changes.  No history of smoking, alcohol, illicit drug use.  She never officially diagnosed with A. fib in the past and her cardiologist placed her on flecainide which she could not tolerate therefore flecainide was discontinued couple of months ago.  ED Course: Upon arrival to ED patient blood pressure elevated, other vital signs within normal limit, afebrile with no leukocytosis, maintaining oxygen saturation on room air, initial labs such as CBC, BMP, troponin, magnesium, TSH: WNL.  Chest x-ray and CT head also came back negative for acute findings.  EKG shows sinus rhythm, prolonged PR interval and right bundle branch block. While  patient was in ED she had another syncopal episode during which telemetry captured an episode of transient complete heart block followed by ventricular escape rhythm and then resumption of sinus rhythm.  EDP consulted cardiology who recommended to hold beta-blocker and transfer patient to [facility] for further evaluation and management.  Review of Systems: As per HPI otherwise negative.    Past Medical History:  Diagnosis Date  . Arthritis   . Asthma   . Atrial fibrillation (HCC)    paroxysmal , addressed  with metoprolol   . Chronic sinusitis   . Complication of anesthesia   . Dyspnea    sob on exertion, reports at her pre-op today 09-13-2018 that her cardiologist has planned for her to do a ECHO for evaluation   . Family history of adverse reaction to anesthesia    sister is hard to wake up from Anesthesia  . Follicular lymphoma ([place])    received Chemotherapy (2 years ago)  and radiation (last 12/2017)  . Hyperlipidemia   . Hypertension   . Hypothyroidism   . Meniere's disease   . PONV (postoperative nausea and vomiting)   . Primary localized osteoarthritis of left knee 09/24/2018  . Primary localized osteoarthritis of right knee 06/18/2018  . Reflux     Past Surgical History:  Procedure Laterality Date  . ABDOMINAL HYSTERECTOMY    . BONE MARROW BIOPSY  06/16/2014   at [place]     bilateral  . CHOLECYSTECTOMY    . KNEE ARTHROSCOPY    . PARTIAL KNEE ARTHROPLASTY Right 06/18/2018   Procedure: UNICOMPARTMENTAL KNEE;  Surgeon: Marchia Bond, MD;  Location: [facility] ORS;  Service:  Orthopedics;  Laterality: Right;  . PARTIAL KNEE ARTHROPLASTY Left 09/24/2018   Procedure: UNICOMPARTMENTAL KNEE;  Surgeon: Marchia Bond, MD;  Location: [facility] ORS;  Service: Orthopedics;  Laterality: Left;     reports that she has never smoked. She has never used smokeless tobacco. She reports that she does not drink alcohol and does not use drugs.  No Known Allergies  Family  History  Problem Relation Age of Onset  . Heart disease Other   . Skin cancer Mother   . Stroke Mother   . Prostate cancer Father   . High blood pressure Father   . Heart attack Father     Prior to Admission medications   Medication Sig Start Date End Date Taking? Authorizing Provider  acyclovir ointment (ZOVIRAX) 5 % Apply 1 application topically daily as needed (for cold sores). 09/03/18   [name], DO  amLODipine (NORVASC) 5 MG tablet Take 1 tablet (5 mg total) by mouth daily. 11/27/19   [name], DO  aspirin EC 81 MG tablet Take 81 mg by mouth daily.    [provider]  baclofen (LIORESAL) 10 MG tablet Take 1 tablet (10 mg total) by mouth 3 (three) times daily as needed (muscle spasms). 03/10/19   [name], DO  Calcium Citrate-Vitamin D (CALCIUM CITRATE+D3 PO) Take 1 tablet by mouth every evening.    [provider]  cephALEXin (KEFLEX) 500 MG capsule Take 1 capsule (500 mg total) by mouth 2 (two) times daily for 7 days. 03/26/20 04/02/20  Volanda Napoleon, PA-C  fluconazole (DIFLUCAN) 150 MG tablet Take 150 mg by mouth once. 07/10/19   [provider]  fluticasone (FLOVENT HFA) 110 MCG/ACT inhaler Inhale 2 puffs into the lungs 2 (two) times daily as needed (for shortness of breath or wheezing). 09/03/18   [name], DO  gabapentin (NEURONTIN) 100 MG capsule  03/11/19   [provider]  hydrochlorothiazide (HYDRODIURIL) 25 MG tablet Take 1 tablet (25 mg total) by mouth daily. 09/16/19   [name], DO  hydroxychloroquine (PLAQUENIL) 200 MG tablet  05/25/19   [provider]  ipratropium (ATROVENT) 0.03 % nasal spray Place into the nose. 07/08/19 07/07/20  [provider]  isosorbide mononitrate (IMDUR) 30 MG 24 hr tablet Take 1 tablet (30 mg total) by mouth daily. 09/03/18   [name], DO  levothyroxine (SYNTHROID) 112 MCG tablet TAKE 1 TABLET BY MOUTH  DAILY BEFORE BREAKFAST 01/13/20    [name], DO  losartan (COZAAR) 100 MG tablet Take 1 tablet (100 mg total) by mouth at bedtime. 05/05/19   [name], DO  meclizine (ANTIVERT) 25 MG tablet Take 1 tablet (25 mg total) by mouth 3 (three) times daily as needed for dizziness. 10/10/18   [name], DO  meloxicam (MOBIC) 15 MG tablet Take 15 mg by mouth daily as needed for pain.    [provider]  Metoprolol Tartrate 75 MG TABS Take 75 mg by mouth 2 (two) times daily. 10/24/19   [name], DO  omeprazole (PRILOSEC) 40 MG capsule TAKE 1 CAPSULE BY MOUTH IN  THE MORNING AND MAY REPEAT  DOSE IN THE EVENING AS  NEEDED FOR ACID REFLUX /  INDIGESTION 03/02/20   [name], DO  ondansetron (ZOFRAN) 4 MG tablet Take 1 tablet (4 mg total) by mouth every 8 (eight) hours as needed for nausea or vomiting. 09/03/18   [name], DO  pravastatin (PRAVACHOL) 80 MG tablet TAKE 1 TABLET BY MOUTH AT  BEDTIME 11/17/19  [name], DO  sennosides-docusate sodium (SENOKOT-S) 8.6-50 MG tablet Take 2 tablets by mouth daily. Patient taking differently: Take 2 tablets by mouth daily as needed (constipation.).  06/18/18   Marchia Bond, MD  tetrahydrozoline (VISINE) 0.05 % ophthalmic solution Place 2 drops into both eyes daily as needed (for dry eyes).    [provider]  triamcinolone (NASACORT ALLERGY 24HR) 55 MCG/ACT AERO nasal inhaler Place 2 sprays into the nose daily. Patient taking differently: Place 2 sprays into the nose daily as needed (allergies.).  09/03/18   [name], DO    Physical Exam: Vitals:   03/28/20 1630 03/28/20 1700 03/28/20 1751 03/28/20 1751  BP: (!) 157/64 (!) 155/65  (!) 163/81  Pulse: 64 66  70  Resp: 18 (!) 21  16  Temp:    98 F (36.7 C)  TempSrc:    Oral  SpO2: 98% 96%  98%  Weight:   84.1 kg   Height:   _0  (1.651 m)     Constitutional: NAD, calm, comfortable, on room air, communicating well, not in acute distress. Eyes: PERRL, lids  and conjunctivae normal ENMT: Mucous membranes are moist. Posterior pharynx clear of any exudate or lesions.Normal dentition.  Neck: normal, supple, no masses, no thyromegaly Respiratory: clear to auscultation bilaterally, no wheezing, no crackles. Normal respiratory effort. No accessory muscle use.  Cardiovascular: Regular rate and rhythm, no murmurs / rubs / gallops. No extremity edema. 2+ pedal pulses. No carotid bruits.  Abdomen: no tenderness, no masses palpated. No hepatosplenomegaly. Bowel sounds positive.  Musculoskeletal: no clubbing / cyanosis. No joint deformity upper and lower extremities. Good ROM, no contractures. Normal muscle tone.  Skin: no rashes, lesions, ulcers. No induration Neurologic: CN 2-12 grossly intact. Sensation intact, DTR normal. Strength 5/5 in all 4.  Psychiatric: Normal judgment and insight. Alert and oriented x 3. Normal mood.    Labs on Admission: I have personally reviewed following labs and imaging studies  CBC: Recent Labs  Lab 03/26/20 1450 03/28/20 1325  WBC 5.7 6.7  NEUTROABS 3.7 5.0  HGB 11.7* 12.5  HCT 36.0 37.8  MCV 88.9 87.5  PLT 214 867   Basic Metabolic Panel: Recent Labs  Lab 03/26/20 1450 03/28/20 1325  NA 136 135  K 3.9 3.6  CL 103 97*  CO2 25 28  GLUCOSE 106* 107*  BUN 32* 14  CREATININE 1.34* 1.02*  CALCIUM 8.9 8.9  MG  --  2.1   GFR: Estimated Creatinine Clearance: 44.7 mL/min (A) (by C-G formula based on SCr of 1.02 mg/dL (H)). Liver Function Tests: Recent Labs  Lab 03/26/20 1450 03/28/20 1325  AST 22 19  ALT 16 17  ALKPHOS 38 35*  BILITOT 1.0 0.9  PROT 6.6 6.9  ALBUMIN 3.7 3.8   Recent Labs  Lab 03/28/20 1325  LIPASE 41   No results for input(s): AMMONIA in the last 168 hours. Coagulation Profile: No results for input(s): INR, PROTIME in the last 168 hours. Cardiac Enzymes: No results for input(s): CKTOTAL, CKMB, CKMBINDEX, TROPONINI in the last 168 hours. BNP (last 3 results) No results for  input(s): PROBNP in the last 8760 hours. HbA1C: No results for input(s): HGBA1C in the last 72 hours. CBG: Recent Labs  Lab 03/26/20 1451  GLUCAP 110*   Lipid Profile: No results for input(s): CHOL, HDL, LDLCALC, TRIG, CHOLHDL, LDLDIRECT in the last 72 hours. Thyroid Function Tests: No results for input(s): TSH, T4TOTAL, FREET4, T3FREE, THYROIDAB in the last 72 hours. Anemia Panel:  No results for input(s): VITAMINB12, FOLATE, FERRITIN, TIBC, IRON, RETICCTPCT in the last 72 hours. Urine analysis:    Component Value Date/Time   COLORURINE YELLOW 03/26/2020 1620   APPEARANCEUR CLEAR 03/26/2020 1620   LABSPEC 1.025 03/26/2020 1620   PHURINE 5.0 03/26/2020 1620   GLUCOSEU NEGATIVE 03/26/2020 1620   HGBUR NEGATIVE 03/26/2020 1620   BILIRUBINUR NEGATIVE 03/26/2020 1620   BILIRUBINUR negative 06/26/2019 1051   KETONESUR NEGATIVE 03/26/2020 1620   PROTEINUR NEGATIVE 03/26/2020 1620   UROBILINOGEN 0.2 06/26/2019 1051   NITRITE POSITIVE (A) 03/26/2020 1620   LEUKOCYTESUR NEGATIVE 03/26/2020 1620    Radiological Exams on Admission: CT Head Wo Contrast  Result Date: 03/28/2020 CLINICAL DATA:  Minor head trauma. Multiple recent syncopal episodes. EXAM: CT HEAD WITHOUT CONTRAST TECHNIQUE: Contiguous axial images were obtained from the base of the skull through the vertex without intravenous contrast. COMPARISON:  March 26, 2020 FINDINGS: Brain: No evidence of acute infarction, hemorrhage, hydrocephalus, extra-axial collection or mass lesion/mass effect. Vascular: No hyperdense vessel or unexpected calcification. Skull: Normal. Negative for fracture or focal lesion. Sinuses/Orbits: Scattered opacification in the frontal and ethmoid sinuses. There is a small amount of fluid in maxillary sinuses. Previous sinus surgery is identified. Mastoid air cells and middle ears are well aerated. Other: None. IMPRESSION: 1. Sinus disease as above. 2. No acute intracranial abnormalities. Electronically  Signed   By: Dorise Bullion III M.D   On: 03/28/2020 14:43   DG Chest Portable 1 View  Result Date: 03/28/2020 CLINICAL DATA:  Syncope. EXAM: PORTABLE CHEST 1 VIEW COMPARISON:  March 26, 2020 FINDINGS: A transcutaneous pacer partially overlies and obscures the left medial upper chest. No pneumothorax. The heart, hila, mediastinum are unchanged. The lungs remain clear. Minimal atelectasis in the left base. IMPRESSION: No active disease. Electronically Signed   By: Dorise Bullion III M.D   On: 03/28/2020 14:44    EKG: Independently reviewed.  Sinus rhythm, prolonged PR interval, right bundle branch block.  Assessment/Plan Principal Problem:   Syncope Active Problems:   Hypothyroidism   Benign essential hypertension   Class 1 obesity due to excess calories with serious comorbidity and body mass index (BMI) of 30.0 to 44.8 in adult   Follicular lymphoma (HCC)   GERD (gastroesophageal reflux disease)   Other hyperlipidemia    Syncope: -Likely cardiogenic.  Patient presented with multiple syncopal episodes-got worse recently.  Blood pressure elevated upon arrival, other vital signs within normal limits. -Initial labs such as CBC, BMP, magnesium level troponin: WNL.  TSH is pending.   -Chest x-ray and CT head negative for acute findings. -Admit patient on telemetry bed. -Monitor heart rate closely.  Reviewed EKG. -Consult cardiology. -We will get transthoracic echo and carotid Doppler ultrasound -  Hold beta-blocker for now. -Consult PT/OT. -On fall precautions.  Hypertension: Blood pressure elevated upon arrival -Continue amlodipine, HCTZ, and losartan.  Hold metoprolol -Monitor blood pressure closely  Hypothyroidism: Check TSH -Continue levothyroxine  CKD stage IIIa: At baseline -Continue to monitor.  Hyperlipidemia: Continue statin  GERD/diffuse esophageal dysmotility: Continue PPI  History of follicular lymphoma: In remission.  Obesity with BMI of 30: -Diet  modification/exercise recommended.  Unable to safely start patient's home medication as med reconciliation is pending by pharmacy.  DVT prophylaxis: Lovenox/SCD Code Status: Full code Family Communication: Patient's daughter present at bedside.  Plan of care discussed with patient in length and she verbalized understanding and agreed with it. Disposition Plan: Likely home in 1 to 2 days Consults called: Cardiology Admission  status: Inpatient   Mckinley Jewel MD [facility]  If 7PM-7AM, please contact night-coverage [email or web address] Password [facility]  03/28/2020, 6:41 PM

## 2020-03-28 NOTE — ED Provider Notes (Signed)
[facility] EMERGENCY DEPARTMENT Provider Note   CSN: [id number] Arrival date & time: 03/28/20  1216     History Chief Complaint  Patient presents with  . Loss of Consciousness  . Emesis    [name] is a 85 y.o. female.  Patient with history of A. fib, Mnire's, hypertension who presents to the ED after multiple syncopal episodes.  Unremarkable vitals.  No fever.  Seen here for something similar 2 days ago.  Had multiple episodes of loss of consciousness today.  Slightly different every time it seems like.  Daughter states that patient was sitting down when all of a sudden her eyes rolled to the side she was nonresponsive for 20 to 30 seconds and then was may be confused afterwards.  Has maybe 4 or 5 of these episodes today.  Patient states that she has had some ringing in the ears.  Hard to say if she felt that she was going to pass out.  Sometimes she does sometimes she does not.  She got nauseous and had some diarrhea after one episode.  She does not have any abdominal pain.  No weakness.  Overall feels better while laying rest.  Not sure if movement makes it worse or better.  The history is provided by the patient and a caregiver.  Loss of Consciousness Episode history:  Multiple Most recent episode:  Today Timing:  Intermittent Progression:  Waxing and waning Chronicity:  New Context: sitting down   Witnessed: yes   Relieved by:  Bed rest Worsened by:  Nothing Associated symptoms: dizziness, nausea and vomiting   Associated symptoms: no chest pain, no fever, no palpitations, no seizures, no shortness of breath and no weakness        Past Medical History:  Diagnosis Date  . Arthritis   . Asthma   . Atrial fibrillation (HCC)    paroxysmal , addressed  with metoprolol   . Chronic sinusitis   . Complication of anesthesia   . Dyspnea    sob on exertion, reports at her pre-op today 09-13-2018 that her cardiologist has planned for her to do a ECHO for  evaluation   . Family history of adverse reaction to anesthesia    sister is hard to wake up from Anesthesia  . Follicular lymphoma ([place])    received Chemotherapy (2 years ago)  and radiation (last 12/2017)  . Hyperlipidemia   . Hypertension   . Hypothyroidism   . Meniere's disease   . PONV (postoperative nausea and vomiting)   . Primary localized osteoarthritis of left knee 09/24/2018  . Primary localized osteoarthritis of right knee 06/18/2018  . Reflux     Patient Active Problem List   Diagnosis Date Noted  . Syncope 03/28/2020  . Shingles 10/24/2019  . MTHFR gene mutation ([place]) 10/09/2018  . Primary localized osteoarthritis of left knee 09/24/2018  . S/P left unicompartmental knee replacement 09/24/2018  . Fatigue 09/03/2018  . Myalgia 09/03/2018  . Vertigo 09/03/2018  . Sebaceous cyst 09/03/2018  . Callus of foot 09/03/2018  . Primary localized osteoarthritis of right knee 06/18/2018  . Status post right partial knee replacement 06/18/2018  . Nasal polyposis 05/16/2018  . Class 1 obesity due to excess calories with serious comorbidity and body mass index (BMI) of 30.0 to 30.9 in adult 07/16/2017  . DDD (degenerative disc disease), cervical 07/16/2017  . GERD (gastroesophageal reflux disease) 07/16/2017  . Other hyperlipidemia 09/27/2016  . Paroxysmal atrial fibrillation ([place]) 09/27/2016  . Benign  essential hypertension 09/15/2015  . Follicular lymphoma ([place]) 12/18/2014  . Lymphoma ([place]) 07/28/2014  . Hypothyroidism 01/14/2014  . Acquired trigger finger 08/05/2013  . Carpal tunnel syndrome on both sides 06/30/2013  . Dyspnea 12/25/2012    Past Surgical History:  Procedure Laterality Date  . ABDOMINAL HYSTERECTOMY    . BONE MARROW BIOPSY  06/16/2014   at [place]     bilateral  . CHOLECYSTECTOMY    . KNEE ARTHROSCOPY    . PARTIAL KNEE ARTHROPLASTY Right 06/18/2018   Procedure: UNICOMPARTMENTAL KNEE;  Surgeon: Marchia Bond, MD;   Location: [facility] OR[facility];  Service: Orthopedics;  Laterality: Right;  . PARTIAL KNEE ARTHROPLASTY Left 09/24/2018   Procedure: UNICOMPARTMENTAL KNEE;  Surgeon: Marchia Bond, MD;  Location: [facility];  Service: Orthopedics;  Laterality: Left;     OB History   No obstetric history on file.     Family History  Problem Relation Age of Onset  . Heart disease Other   . Skin cancer Mother   . Stroke Mother   . Prostate cancer Father   . High blood pressure Father   . Heart attack Father     Social History   Tobacco Use  . Smoking status: Never Smoker  . Smokeless tobacco: Never Used  Vaping Use  . Vaping Use: Never used  Substance Use Topics  . Alcohol use: No  . Drug use: No    Home Medications Prior to Admission medications   Medication Sig Start Date End Date Taking? Authorizing Provider  acyclovir ointment (ZOVIRAX) 5 % Apply 1 application topically daily as needed (for cold sores). 09/03/18   [name], DO  amLODipine (NORVASC) 5 MG tablet Take 1 tablet (5 mg total) by mouth daily. 11/27/19   [name], DO  aspirin EC 81 MG tablet Take 81 mg by mouth daily.    [provider]  baclofen (LIORESAL) 10 MG tablet Take 1 tablet (10 mg total) by mouth 3 (three) times daily as needed (muscle spasms). 03/10/19   [name], DO  Calcium Citrate-Vitamin D (CALCIUM CITRATE+D3 PO) Take 1 tablet by mouth every evening.    [provider]  cephALEXin (KEFLEX) 500 MG capsule Take 1 capsule (500 mg total) by mouth 2 (two) times daily for 7 days. 03/26/20 04/02/20  Volanda Napoleon, PA-C  fluconazole (DIFLUCAN) 150 MG tablet Take 150 mg by mouth once. 07/10/19   [provider]  fluticasone (FLOVENT HFA) 110 MCG/ACT inhaler Inhale 2 puffs into the lungs 2 (two) times daily as needed (for shortness of breath or wheezing). 09/03/18   [name], DO  gabapentin (NEURONTIN) 100 MG capsule  03/11/19   [provider]  hydrochlorothiazide  (HYDRODIURIL) 25 MG tablet Take 1 tablet (25 mg total) by mouth daily. 09/16/19   [name], DO  hydroxychloroquine (PLAQUENIL) 200 MG tablet  05/25/19   [provider]  ipratropium (ATROVENT) 0.03 % nasal spray Place into the nose. 07/08/19 07/07/20  [provider]  isosorbide mononitrate (IMDUR) 30 MG 24 hr tablet Take 1 tablet (30 mg total) by mouth daily. 09/03/18   [name], DO  levothyroxine (SYNTHROID) 112 MCG tablet TAKE 1 TABLET BY MOUTH  DAILY BEFORE BREAKFAST 01/13/20   [name], DO  losartan (COZAAR) 100 MG tablet Take 1 tablet (100 mg total) by mouth at bedtime. 05/05/19   [name], DO  meclizine (ANTIVERT) 25 MG tablet Take 1 tablet (25 mg total) by mouth 3 (three)  times daily as needed for dizziness. 10/10/18   [name], DO  meloxicam (MOBIC) 15 MG tablet Take 15 mg by mouth daily as needed for pain.    [provider]  Metoprolol Tartrate 75 MG TABS Take 75 mg by mouth 2 (two) times daily. 10/24/19   [name], DO  omeprazole (PRILOSEC) 40 MG capsule TAKE 1 CAPSULE BY MOUTH IN  THE MORNING AND MAY REPEAT  DOSE IN THE EVENING AS  NEEDED FOR ACID REFLUX /  INDIGESTION 03/02/20   [name], DO  ondansetron (ZOFRAN) 4 MG tablet Take 1 tablet (4 mg total) by mouth every 8 (eight) hours as needed for nausea or vomiting. 09/03/18   [name], DO  pravastatin (PRAVACHOL) 80 MG tablet TAKE 1 TABLET BY MOUTH AT  BEDTIME 11/17/19   [name], DO  sennosides-docusate sodium (SENOKOT-S) 8.6-50 MG tablet Take 2 tablets by mouth daily. Patient taking differently: Take 2 tablets by mouth daily as needed (constipation.).  06/18/18   Marchia Bond, MD  tetrahydrozoline (VISINE) 0.05 % ophthalmic solution Place 2 drops into both eyes daily as needed (for dry eyes).    [provider]  triamcinolone (NASACORT ALLERGY 24HR) 55 MCG/ACT AERO nasal inhaler Place 2 sprays into the nose  daily. Patient taking differently: Place 2 sprays into the nose daily as needed (allergies.).  09/03/18   [name], DO    Allergies    Patient has no known allergies.  Review of Systems   Review of Systems  Constitutional: Negative for chills and fever.  HENT: Negative for ear pain and sore throat.   Eyes: Negative for pain and visual disturbance.  Respiratory: Negative for cough and shortness of breath.   Cardiovascular: Positive for syncope. Negative for chest pain and palpitations.  Gastrointestinal: Positive for nausea and vomiting. Negative for abdominal pain.  Genitourinary: Negative for dysuria and hematuria.  Musculoskeletal: Negative for arthralgias and back pain.  Skin: Negative for color change and rash.  Neurological: Positive for dizziness and light-headedness. Negative for tremors, seizures, syncope, facial asymmetry and weakness.  All other systems reviewed and are negative.   Physical Exam Updated Vital Signs  ED Triage Vitals  Enc Vitals Group     BP 03/28/20 1224 (!) 178/108     Pulse Rate 03/28/20 1224 72     Resp 03/28/20 1224 (!) 24     Temp 03/28/20 1226 98 F (36.7 C)     Temp Source 03/28/20 1226 Oral     SpO2 03/28/20 1224 97 %     Weight --      Height --      Head Circumference --      Peak Flow --      Pain Score 03/28/20 1222 0     Pain Loc --      Pain Edu? --      Excl. in [place]? --     Physical Exam Vitals and nursing note reviewed.  Constitutional:      General: She is not in acute distress.    Appearance: She is well-developed. She is not ill-appearing.  HENT:     Head: Normocephalic and atraumatic.     Nose: Nose normal.     Mouth/Throat:     Mouth: Mucous membranes are moist.  Eyes:     Extraocular Movements: Extraocular movements intact.     Conjunctiva/sclera: Conjunctivae normal.     Pupils: Pupils are equal, round, and reactive to light.  Cardiovascular:  Rate and Rhythm: Normal rate and regular rhythm.      Pulses: Normal pulses.     Heart sounds: Normal heart sounds. No murmur heard.   Pulmonary:     Effort: Pulmonary effort is normal. No respiratory distress.     Breath sounds: Normal breath sounds.  Abdominal:     Palpations: Abdomen is soft.     Tenderness: There is no abdominal tenderness.  Musculoskeletal:        General: No tenderness. Normal range of motion.     Cervical back: Normal range of motion and neck supple.  Skin:    General: Skin is warm and dry.     Capillary Refill: Capillary refill takes less than 2 seconds.  Neurological:     General: No focal deficit present.     Mental Status: She is alert and oriented to person, place, and time.     Cranial Nerves: No cranial nerve deficit.     Sensory: No sensory deficit.     Motor: No weakness.     Coordination: Coordination normal.     Comments: 5+/5 strength, normal sensation, normal speech, no drift, normal finger to nose finger, no visual field cut, equivocal d[place]      ED Results / Procedures / Treatments   Labs (all labs ordered are listed, but only abnormal results are displayed) Labs Reviewed  BASIC METABOLIC PANEL - Abnormal; Notable for the following components:      Result Value   Chloride 97 (*)    Glucose, Bld 107 (*)    Creatinine, Ser 1.02 (*)    GFR calc non Af Amer 51 (*)    GFR calc Af Amer 59 (*)    All other components within normal limits  HEPATIC FUNCTION PANEL - Abnormal; Notable for the following components:   Alkaline Phosphatase 35 (*)    All other components within normal limits  SARS CORONAVIRUS 2 BY RT PCR (HOSPITAL ORDER, [facility])  CBC WITH DIFFERENTIAL/PLATELET  LIPASE, BLOOD  MAGNESIUM  URINALYSIS, ROUTINE W REFLEX MICROSCOPIC  FLECAINIDE LEVEL  TSH  TROPONIN I (HIGH SENSITIVITY)  TROPONIN I (HIGH SENSITIVITY)    EKG EKG Interpretation  Date/Time:  Sunday March 28 2020 12:57:13 EDT Ventricular Rate:  61 PR Interval:    QRS  Duration: 158 QT Interval:  511 QTC Calculation: 515 R Axis:   -15 Text Interpretation: Sinus rhythm Prolonged PR interval Right bundle branch block Confirmed by Lennice Sites [id number]) on 03/28/2020 1:29:50 PM   Radiology CT Head Wo Contrast  Result Date: 03/28/2020 CLINICAL DATA:  Minor head trauma. Multiple recent syncopal episodes. EXAM: CT HEAD WITHOUT CONTRAST TECHNIQUE: Contiguous axial images were obtained from the base of the skull through the vertex without intravenous contrast. COMPARISON:  March 26, 2020 FINDINGS: Brain: No evidence of acute infarction, hemorrhage, hydrocephalus, extra-axial collection or mass lesion/mass effect. Vascular: No hyperdense vessel or unexpected calcification. Skull: Normal. Negative for fracture or focal lesion. Sinuses/Orbits: Scattered opacification in the frontal and ethmoid sinuses. There is a small amount of fluid in maxillary sinuses. Previous sinus surgery is identified. Mastoid air cells and middle ears are well aerated. Other: None. IMPRESSION: 1. Sinus disease as above. 2. No acute intracranial abnormalities. Electronically Signed   By: Dorise Bullion III M.D   On: 03/28/2020 14:43   DG Chest Portable 1 View  Result Date: 03/28/2020 CLINICAL DATA:  Syncope. EXAM: PORTABLE CHEST 1 VIEW COMPARISON:  March 26, 2020 FINDINGS: A  transcutaneous pacer partially overlies and obscures the left medial upper chest. No pneumothorax. The heart, hila, mediastinum are unchanged. The lungs remain clear. Minimal atelectasis in the left base. IMPRESSION: No active disease. Electronically Signed   By: Dorise Bullion III M.D   On: 03/28/2020 14:44   DG Chest Portable 1 View  Result Date: 03/26/2020 CLINICAL DATA:  Weakness EXAM: PORTABLE CHEST 1 VIEW COMPARISON:  Radiograph 03/03/2019 FINDINGS: Some coarsened interstitial and bronchitic features are similar to priors. Some hazy opacity towards the lung bases likely reflecting some atelectatic change and  overlying tissue. Mild central vascular congestion without features of frank edema. The aorta is calcified. The remaining cardiomediastinal contours are unremarkable. No acute osseous or soft tissue abnormality. Degenerative changes are present in the imaged spine and shoulders. Telemetry leads overlie the chest. IMPRESSION: 1. Basilar atelectasis. 2. Chronic bronchitic features. 3. Mild central vascular congestion without features of frank edema. 4.  Aortic Atherosclerosis (ICD10-I70.0). Electronically Signed   By: Lovena Le M.D.   On: 03/26/2020 16:21    Procedures Procedures (including critical care time)  Medications Ordered in ED Medications  ondansetron (ZOFRAN) injection 4 mg (4 mg Intravenous Given 03/28/20 1332)    ED Course  I have reviewed the triage vital signs and the nursing notes.  Pertinent labs & imaging results that were available during my care of the patient were reviewed by me and considered in my medical decision making (see chart for details).    MDM Rules/Calculators/A&P                          [name] is an 85 year old female with history of high cholesterol, hypertension, Mnire's who presents to the ED after syncopal events.  Unremarkable vitals except for mild hypertension.  Otherwise normal vitals, no fever.  Has had multiple episodes of syncope over the last several days.  I saw patient several days ago for the same and seen more vasovagal orthostatic in her ear.  However today she had about 4 or 5 episodes where she is sitting in the eyes rolled to the back of her head and she does not appear to be conversational during that time.  There is no diffuse body shaking but patient does not remember that these occur.  May be some period of confusion afterwards.  Does not have a history of seizures.  She does state there is been some ringing in her ears.  Feels off balance at times.  Denies any chest pain or shortness of breath.  She did get nausea and vomiting  diarrhea after one of the episodes.  EKG shows sinus rhythm.  No obvious ischemic changes.  Unchanged from prior.  Overall multiple syncopal events.  Seems less likely cardiac but could be arrhythmia.  No obvious heart block on EKGs.  Possibly seizures, possibly inner ear related, possibly vertigo, possibly stroke related.  Will get CTA of head and neck.  We will get basic labs.  Anticipate admission as she would likely benefit from MRI, possibly EEG, echocardiogram.  While patient was awaiting work-up she did have a sinus pause for several seconds.  Suspect that this is the cause of her syncope episodes.  She was symptomatic during this event.  Talk to Dr. Harl Bowie cardiology about this.  Could be medication related as she is on Lopressor.  Patient otherwise had unremarkable work-up.  Decided against CTA of head and neck because do not believe this is stroke related.  Head CT was normal.  No significant anemia, lateral abnormality, kidney injury.  Overall suspect cardiac arrhythmia.  To be admitted to medicine for further care.  This chart was dictated using voice recognition software.  Despite best efforts to proofread,  errors can occur which can change the documentation meaning.      Final Clinical Impression(s) / ED Diagnoses Final diagnoses:  Syncope and collapse  Sinus pause  Cardiac arrhythmia, unspecified cardiac arrhythmia type    Rx / DC Orders ED Discharge Orders    None       Lennice Sites, DO 03/28/20 1539

## 2020-03-28 NOTE — ED Notes (Signed)
Pt had a brief episode of syncope, cardiac monitor demonstrated a HR of 12/min, R to R very wide, pt turned pale in color, with tactile stimulation and verbal stimulation pt returned to HR of 60's. EDP informed, SR x 2 up, pacer pads applied

## 2020-03-28 NOTE — ED Notes (Signed)
Patient symptomatic when her heart rate drops, patient able to tell family at bedside when episode will occur.

## 2020-03-28 NOTE — ED Notes (Signed)
ED Provider at bedside. 

## 2020-03-28 NOTE — Consult Note (Signed)
Referring Physician: Dr. Doristine Bosworth Primary Physician: [name], DO  Primary Cardiologist: Reason for Consultation: Dizziness   HPI:85 yr old with PAF transferred for dizziness. She as on BB and flecainide for PAF. Currently discontinued. Patient has history of A. fib, Mnire's, hypertension who presents to the ED after multiple syncopal episodes.  She states that she has been having episodes of syncope since last Friday.  Episodes last about 20 to 30 seconds with loss of consciousness and comes back quickly.  She feels weak following this.  Her daughter as visit witnessed these episodes and states that there was no seizures or any postictal state.  She had an episode while in the ER at [facility] where it was noted that she had sinus pause with extreme bradycardia.  Patient has not been on flecainide for months but has been on metoprolol which has since been stopped.  Here in the hospital, she has had mild episodes but nothing significant.  She states that she has Mnire's disease which is associated with dyspnea and nausea and nothing similar to the symptoms that she is currently having.   Review of Systems:     Cardiac Review of Systems: {Y] = yes [ ]  = no  Chest Pain [    ]  Resting SOB [   ] Exertional SOB  [  ]  Orthopnea [  ]   Pedal Edema [   ]    Palpitations [  ] Syncope  Blue.Reese  ]   Presyncope Blue.Reese   ]  General Review of Systems: [Y] = yes [  ]=no Constitional: recent weight change [  ]; anorexia [  ]; fatigue [  ]; nausea [  ]; night sweats [  ]; fever [  ]; or chills [  ];                                                                     Eyes : blurred vision [  ]; diplopia [   ]; vision changes [  ];  Amaurosis fugax[  ]; Resp: cough [  ];  wheezing[  ];  hemoptysis[  ];  PND [  ];  GI:  gallstones[  ], vomiting[  ];  dysphagia[  ]; melena[  ];  hematochezia [  ]; heartburn[  ];   GU: kidney stones [  ]; hematuria[  ];   dysuria [  ];  nocturia[  ]; incontinence [  ];              Skin: rash, swelling[  ];, hair loss[  ];  peripheral edema[  ];  or itching[  ]; Musculosketetal: myalgias[  ];  joint swelling[  ];  joint erythema[  ];  joint pain[  ];  back pain[  ];  Heme/Lymph: bruising[  ];  bleeding[  ];  anemia[  ];  Neuro: TIA[  ];  headaches[  ];  stroke[  ];  vertigo[  ];  seizures[  ];   paresthesias[  ];  difficulty walking[  ];  Psych:depression[  ]; anxiety[  y];  Endocrine: diabetes[  ];  thyroid dysfunction[  ];  Other:  Past Medical History:  Diagnosis Date  . Arthritis   . Asthma   .  Atrial fibrillation (HCC)    paroxysmal , addressed  with metoprolol   . Chronic sinusitis   . Complication of anesthesia   . Dyspnea    sob on exertion, reports at her pre-op today 09-13-2018 that her cardiologist has planned for her to do a ECHO for evaluation   . Family history of adverse reaction to anesthesia    sister is hard to wake up from Anesthesia  . Follicular lymphoma ([place])    received Chemotherapy (2 years ago)  and radiation (last 12/2017)  . Hyperlipidemia   . Hypertension   . Hypothyroidism   . Meniere's disease   . PONV (postoperative nausea and vomiting)   . Primary localized osteoarthritis of left knee 09/24/2018  . Primary localized osteoarthritis of right knee 06/18/2018  . Reflux     Medications Prior to Admission  Medication Sig Dispense Refill  . acyclovir ointment (ZOVIRAX) 5 % Apply 1 application topically daily as needed (for cold sores). 15 g 3  . amLODipine (NORVASC) 5 MG tablet Take 1 tablet (5 mg total) by mouth daily. 90 tablet 1  . aspirin EC 81 MG tablet Take 81 mg by mouth daily.    . baclofen (LIORESAL) 10 MG tablet Take 1 tablet (10 mg total) by mouth 3 (three) times daily as needed (muscle spasms). 30 tablet 0  . Calcium Citrate-Vitamin D (CALCIUM CITRATE+D3 PO) Take 1 tablet by mouth every evening.    . cephALEXin (KEFLEX) 500 MG capsule Take 1 capsule (500 mg total) by mouth 2 (two) times daily for 7 days. 14  capsule 0  . fluconazole (DIFLUCAN) 150 MG tablet Take 150 mg by mouth once.    . fluticasone (FLOVENT HFA) 110 MCG/ACT inhaler Inhale 2 puffs into the lungs 2 (two) times daily as needed (for shortness of breath or wheezing). 3 Inhaler 3  . gabapentin (NEURONTIN) 100 MG capsule     . hydrochlorothiazide (HYDRODIURIL) 25 MG tablet Take 1 tablet (25 mg total) by mouth daily. 90 tablet 1  . hydroxychloroquine (PLAQUENIL) 200 MG tablet     . ipratropium (ATROVENT) 0.03 % nasal spray Place into the nose.    . isosorbide mononitrate (IMDUR) 30 MG 24 hr tablet Take 1 tablet (30 mg total) by mouth daily. 90 tablet 3  . levothyroxine (SYNTHROID) 112 MCG tablet TAKE 1 TABLET BY MOUTH  DAILY BEFORE BREAKFAST 90 tablet 3  . losartan (COZAAR) 100 MG tablet Take 1 tablet (100 mg total) by mouth at bedtime. 90 tablet 3  . meclizine (ANTIVERT) 25 MG tablet Take 1 tablet (25 mg total) by mouth 3 (three) times daily as needed for dizziness. 30 tablet 0  . meloxicam (MOBIC) 15 MG tablet Take 15 mg by mouth daily as needed for pain.    . Metoprolol Tartrate 75 MG TABS Take 75 mg by mouth 2 (two) times daily. 180 tablet 1  . omeprazole (PRILOSEC) 40 MG capsule TAKE 1 CAPSULE BY MOUTH IN  THE MORNING AND MAY REPEAT  DOSE IN THE EVENING AS  NEEDED FOR ACID REFLUX /  INDIGESTION 180 capsule 3  . ondansetron (ZOFRAN) 4 MG tablet Take 1 tablet (4 mg total) by mouth every 8 (eight) hours as needed for nausea or vomiting. 10 tablet 0  . pravastatin (PRAVACHOL) 80 MG tablet TAKE 1 TABLET BY MOUTH AT  BEDTIME 90 tablet 3  . sennosides-docusate sodium (SENOKOT-S) 8.6-50 MG tablet Take 2 tablets by mouth daily. (Patient taking differently: Take 2 tablets by mouth daily  as needed (constipation.). ) 30 tablet 1  . tetrahydrozoline (VISINE) 0.05 % ophthalmic solution Place 2 drops into both eyes daily as needed (for dry eyes).    . triamcinolone (NASACORT ALLERGY 24HR) 55 MCG/ACT AERO nasal inhaler Place 2 sprays into the nose  daily. (Patient taking differently: Place 2 sprays into the nose daily as needed (allergies.). ) 3 Inhaler 3     . enoxaparin (LOVENOX) injection  40 mg Subcutaneous Q24H  . sodium chloride flush  3 mL Intravenous Q12H    Infusions:   No Known Allergies  Social History   Socioeconomic History  . Marital status: Widowed    Spouse name: Not on file  . Number of children: 1  . Years of education: 25  . Highest education level: 12th grade  Occupational History  . Occupation: Theatre manager    Comment: retired  Tobacco Use  . Smoking status: Never Smoker  . Smokeless tobacco: Never Used  Vaping Use  . Vaping Use: Never used  Substance and Sexual Activity  . Alcohol use: No  . Drug use: No  . Sexual activity: Not Currently    Partners: Male  Other Topics Concern  . Not on file  Social History Narrative  . Not on file   Social Determinants of Health   Financial Resource Strain:   . Difficulty of Paying Living Expenses: Not on file  Food Insecurity:   . Worried About Charity fundraiser in the Last Year: Not on file  . Ran Out of Food in the Last Year: Not on file  Transportation Needs:   . Lack of Transportation (Medical): Not on file  . Lack of Transportation (Non-Medical): Not on file  Physical Activity:   . Days of Exercise per Week: Not on file  . Minutes of Exercise per Session: Not on file  Stress:   . Feeling of Stress : Not on file  Social Connections:   . Frequency of Communication with Friends and Family: Not on file  . Frequency of Social Gatherings with Friends and Family: Not on file  . Attends Religious Services: Not on file  . Active Member of Clubs or Organizations: Not on file  . Attends Archivist Meetings: Not on file  . Marital Status: Not on file  Intimate Partner Violence:   . Fear of Current or Ex-Partner: Not on file  . Emotionally Abused: Not on file  . Physically Abused: Not on file  . Sexually Abused: Not on file     Family History  Problem Relation Age of Onset  . Heart disease Other   . Skin cancer Mother   . Stroke Mother   . Prostate cancer Father   . High blood pressure Father   . Heart attack Father     PHYSICAL EXAM: Vitals:   03/28/20 1700 03/28/20 1751  BP: (!) 155/65 (!) 163/81  Pulse: 66 70  Resp: (!) 21 16  Temp:  98 F (36.7 C)  SpO2: 96% 98%    No intake or output data in the 24 hours ending 03/28/20 1853  General:  Well appearing. No respiratory difficulty HEENT: normal Neck: supple. no JVD. Carotids 2+ bilat; no bruits. No lymphadenopathy or thryomegaly appreciated. Cor: PMI nondisplaced. Regular rate & rhythm. No rubs, gallops or murmurs. Lungs: clear Abdomen: soft, nontender, nondistended. No hepatosplenomegaly. No bruits or masses. Good bowel sounds. Extremities: no cyanosis, clubbing, rash, edema Neuro: alert & oriented x 3, cranial nerves grossly intact. moves all  4 extremities w/o difficulty. Affect pleasant.  ECG: Sinus rhythm with 1st degree AVB and RBBB  Results for orders placed or performed during the hospital encounter of 03/28/20 (from the past 24 hour(s))  CBC with Differential     Status: None   Collection Time: 03/28/20  1:25 PM  Result Value Ref Range   WBC 6.7 4.0 - 10.5 K/uL   RBC 4.32 3.87 - 5.11 MIL/uL   Hemoglobin 12.5 12.0 - 15.0 g/dL   HCT 37.8 36 - 46 %   MCV 87.5 80.0 - 100.0 fL   MCH 28.9 26.0 - 34.0 pg   MCHC 33.1 30.0 - 36.0 g/dL   RDW 12.2 11.5 - 15.5 %   Platelets 220 150 - 400 K/uL   nRBC 0.0 0.0 - 0.2 %   Neutrophils Relative % 75 %   Neutro Abs 5.0 1.7 - 7.7 K/uL   Lymphocytes Relative 15 %   Lymphs Abs 1.0 0.7 - 4.0 K/uL   Monocytes Relative 7 %   Monocytes Absolute 0.4 0 - 1 K/uL   Eosinophils Relative 2 %   Eosinophils Absolute 0.1 0 - 0 K/uL   Basophils Relative 0 %   Basophils Absolute 0.0 0 - 0 K/uL   Immature Granulocytes 1 %   Abs Immature Granulocytes 0.04 0.00 - 0.07 K/uL  Basic metabolic panel      Status: Abnormal   Collection Time: 03/28/20  1:25 PM  Result Value Ref Range   Sodium 135 135 - 145 mmol/L   Potassium 3.6 3.5 - 5.1 mmol/L   Chloride 97 (L) 98 - 111 mmol/L   CO2 28 22 - 32 mmol/L   Glucose, Bld 107 (H) 70 - 99 mg/dL   BUN 14 8 - 23 mg/dL   Creatinine, Ser 1.02 (H) 0.44 - 1.00 mg/dL   Calcium 8.9 8.9 - 10.3 mg/dL   GFR calc non Af Amer 51 (L) >60 mL/min   GFR calc Af Amer 59 (L) >60 mL/min   Anion gap 10 5 - 15  Hepatic function panel     Status: Abnormal   Collection Time: 03/28/20  1:25 PM  Result Value Ref Range   Total Protein 6.9 6.5 - 8.1 g/dL   Albumin 3.8 3.5 - 5.0 g/dL   AST 19 15 - 41 U/L   ALT 17 0 - 44 U/L   Alkaline Phosphatase 35 (L) 38 - 126 U/L   Total Bilirubin 0.9 0.3 - 1.2 mg/dL   Bilirubin, Direct 0.2 0.0 - 0.2 mg/dL   Indirect Bilirubin 0.7 0.3 - 0.9 mg/dL  Lipase, blood     Status: None   Collection Time: 03/28/20  1:25 PM  Result Value Ref Range   Lipase 41 11 - 51 U/L  Troponin I (High Sensitivity)     Status: None   Collection Time: 03/28/20  1:25 PM  Result Value Ref Range   Troponin I (High Sensitivity) 10 <18 ng/L  SARS Coronavirus 2 by RT PCR (hospital order, performed in [place] hospital lab) Nasopharyngeal Nasopharyngeal Swab     Status: None   Collection Time: 03/28/20  1:25 PM   Specimen: Nasopharyngeal Swab  Result Value Ref Range   SARS Coronavirus 2 NEGATIVE NEGATIVE  Magnesium     Status: None   Collection Time: 03/28/20  1:25 PM  Result Value Ref Range   Magnesium 2.1 1.7 - 2.4 mg/dL  Troponin I (High Sensitivity)     Status: None   Collection Time:  03/28/20  2:48 PM  Result Value Ref Range   Troponin I (High Sensitivity) 9 <18 ng/L   CT Head Wo Contrast  Result Date: 03/28/2020 CLINICAL DATA:  Minor head trauma. Multiple recent syncopal episodes. EXAM: CT HEAD WITHOUT CONTRAST TECHNIQUE: Contiguous axial images were obtained from the base of the skull through the vertex without intravenous contrast.  COMPARISON:  March 26, 2020 FINDINGS: Brain: No evidence of acute infarction, hemorrhage, hydrocephalus, extra-axial collection or mass lesion/mass effect. Vascular: No hyperdense vessel or unexpected calcification. Skull: Normal. Negative for fracture or focal lesion. Sinuses/Orbits: Scattered opacification in the frontal and ethmoid sinuses. There is a small amount of fluid in maxillary sinuses. Previous sinus surgery is identified. Mastoid air cells and middle ears are well aerated. Other: None. IMPRESSION: 1. Sinus disease as above. 2. No acute intracranial abnormalities. Electronically Signed   By: Dorise Bullion III M.D   On: 03/28/2020 14:43   DG Chest Portable 1 View  Result Date: 03/28/2020 CLINICAL DATA:  Syncope. EXAM: PORTABLE CHEST 1 VIEW COMPARISON:  March 26, 2020 FINDINGS: A transcutaneous pacer partially overlies and obscures the left medial upper chest. No pneumothorax. The heart, hila, mediastinum are unchanged. The lungs remain clear. Minimal atelectasis in the left base. IMPRESSION: No active disease. Electronically Signed   By: Dorise Bullion III M.D   On: 03/28/2020 14:44     ASSESSMENT and PLAN/DISCUSSION: Sick sinus syndrome: Appears to have sick sinus syndrome with sinus pause that is symptomatic.  Patient is off the beta-blockers.  Will monitor and plan on pacemaker placement in the morning.  Hypertension: On amlodipine.  Will not shoot for a strict control because of her unstable heart rhythm.

## 2020-03-28 NOTE — ED Triage Notes (Addendum)
Daughter states pt with 5 syncopal episodes this morning with vomiting and bowel urgency. Was seen here 9/17 for 2 syncopal episodes. Pt dry heaving in triage

## 2020-03-29 ENCOUNTER — Inpatient Hospital Stay (HOSPITAL_COMMUNITY): Payer: Medicare Other

## 2020-03-29 DIAGNOSIS — I340 Nonrheumatic mitral (valve) insufficiency: Secondary | ICD-10-CM

## 2020-03-29 DIAGNOSIS — R55 Syncope and collapse: Secondary | ICD-10-CM

## 2020-03-29 DIAGNOSIS — I351 Nonrheumatic aortic (valve) insufficiency: Secondary | ICD-10-CM

## 2020-03-29 DIAGNOSIS — I499 Cardiac arrhythmia, unspecified: Secondary | ICD-10-CM

## 2020-03-29 DIAGNOSIS — E039 Hypothyroidism, unspecified: Secondary | ICD-10-CM

## 2020-03-29 LAB — ECHOCARDIOGRAM COMPLETE
Area-P 1/2: 2.54 cm2
Calc EF: 62.5 %
Height: 65 in
P 1/2 time: 435 msec
S' Lateral: 3.7 cm
Single Plane A2C EF: 49.2 %
Single Plane A4C EF: 70.7 %
Weight: 2947.2 oz

## 2020-03-29 LAB — COMPREHENSIVE METABOLIC PANEL
ALT: 16 U/L (ref 0–44)
AST: 19 U/L (ref 15–41)
Albumin: 3.3 g/dL — ABNORMAL LOW (ref 3.5–5.0)
Alkaline Phosphatase: 36 U/L — ABNORMAL LOW (ref 38–126)
Anion gap: 9 (ref 5–15)
BUN: 13 mg/dL (ref 8–23)
CO2: 29 mmol/L (ref 22–32)
Calcium: 9.2 mg/dL (ref 8.9–10.3)
Chloride: 99 mmol/L (ref 98–111)
Creatinine, Ser: 1.37 mg/dL — ABNORMAL HIGH (ref 0.44–1.00)
GFR calc Af Amer: 41 mL/min — ABNORMAL LOW (ref >60)
GFR calc non Af Amer: 36 mL/min — ABNORMAL LOW (ref >60)
Glucose, Bld: 107 mg/dL — ABNORMAL HIGH (ref 70–99)
Potassium: 3.4 mmol/L — ABNORMAL LOW (ref 3.5–5.1)
Sodium: 137 mmol/L (ref 135–145)
Total Bilirubin: 1.1 mg/dL (ref 0.3–1.2)
Total Protein: 6.1 g/dL — ABNORMAL LOW (ref 6.5–8.1)

## 2020-03-29 LAB — CBC
HCT: 37 % (ref 36.0–46.0)
Hemoglobin: 12.3 g/dL (ref 12.0–15.0)
MCH: 29.2 pg (ref 26.0–34.0)
MCHC: 33.2 g/dL (ref 30.0–36.0)
MCV: 87.9 fL (ref 80.0–100.0)
Platelets: 202 10*3/uL (ref 150–400)
RBC: 4.21 MIL/uL (ref 3.87–5.11)
RDW: 12.3 % (ref 11.5–15.5)
WBC: 5.5 10*3/uL (ref 4.0–10.5)
nRBC: 0 % (ref 0.0–0.2)

## 2020-03-29 LAB — GLUCOSE, CAPILLARY: Glucose-Capillary: 94 mg/dL (ref 70–99)

## 2020-03-29 MED ORDER — LOSARTAN POTASSIUM 50 MG PO TABS
100.0000 mg | ORAL_TABLET | Freq: Every day | ORAL | Status: DC
  Administered 2020-03-29 – 2020-03-31 (×3): 100 mg via ORAL
  Filled 2020-03-29 (×3): qty 2

## 2020-03-29 MED ORDER — PANTOPRAZOLE SODIUM 40 MG PO TBEC
40.0000 mg | DELAYED_RELEASE_TABLET | Freq: Every day | ORAL | Status: DC
  Administered 2020-03-29 – 2020-03-31 (×3): 40 mg via ORAL
  Filled 2020-03-29 (×3): qty 1

## 2020-03-29 MED ORDER — PRAVASTATIN SODIUM 40 MG PO TABS
80.0000 mg | ORAL_TABLET | Freq: Every day | ORAL | Status: DC
  Administered 2020-03-29 – 2020-03-30 (×2): 80 mg via ORAL
  Filled 2020-03-29 (×2): qty 2

## 2020-03-29 MED ORDER — POTASSIUM CHLORIDE CRYS ER 20 MEQ PO TBCR
30.0000 meq | EXTENDED_RELEASE_TABLET | Freq: Once | ORAL | Status: AC
  Administered 2020-03-29: 30 meq via ORAL
  Filled 2020-03-29: qty 1

## 2020-03-29 MED ORDER — LEVOTHYROXINE SODIUM 112 MCG PO TABS
112.0000 ug | ORAL_TABLET | Freq: Every day | ORAL | Status: DC
  Administered 2020-03-30 – 2020-03-31 (×2): 112 ug via ORAL
  Filled 2020-03-29 (×2): qty 1

## 2020-03-29 MED ORDER — HYDROXYCHLOROQUINE SULFATE 200 MG PO TABS
200.0000 mg | ORAL_TABLET | Freq: Two times a day (BID) | ORAL | Status: DC
  Administered 2020-03-29 – 2020-03-31 (×4): 200 mg via ORAL
  Filled 2020-03-29 (×5): qty 1

## 2020-03-29 MED ORDER — IPRATROPIUM BROMIDE 0.06 % NA SOLN
2.0000 | Freq: Every day | NASAL | Status: DC | PRN
  Filled 2020-03-29: qty 15

## 2020-03-29 MED ORDER — HYDROCHLOROTHIAZIDE 12.5 MG PO CAPS
12.5000 mg | ORAL_CAPSULE | Freq: Every day | ORAL | Status: DC
  Administered 2020-03-29 – 2020-03-31 (×3): 12.5 mg via ORAL
  Filled 2020-03-29 (×3): qty 1

## 2020-03-29 MED ORDER — GABAPENTIN 100 MG PO CAPS
100.0000 mg | ORAL_CAPSULE | Freq: Two times a day (BID) | ORAL | Status: DC
  Administered 2020-03-29 – 2020-03-31 (×4): 100 mg via ORAL
  Filled 2020-03-29 (×4): qty 1

## 2020-03-29 NOTE — Plan of Care (Signed)
  Problem: Coping: Goal: Level of anxiety will decrease Outcome: Completed/Met   Problem: Elimination: Goal: Will not experience complications related to urinary retention Outcome: Completed/Met   Problem: Pain Managment: Goal: General experience of comfort will improve Outcome: Completed/Met   Problem: Skin Integrity: Goal: Risk for impaired skin integrity will decrease Outcome: Completed/Met   

## 2020-03-29 NOTE — Assessment & Plan Note (Addendum)
--  TSH WNL; continue levothyroxine 

## 2020-03-29 NOTE — Assessment & Plan Note (Addendum)
--  stable; continue HCTZ and losartan

## 2020-03-29 NOTE — Progress Notes (Signed)
Carotid study completed.   See CVProc for preliminary results.   [name], RDMS, RVT 

## 2020-03-29 NOTE — Progress Notes (Signed)
This Rn was called in the pts room, pt had near syncopal episode, pt describe it as coming and going, I'm ok now. daughter at bedside.  No call from CCMD at this time. I called CCMD and the tech said " I did not catch it". Pt had 3.49 sec paused. Dr. Sarajane Jews on the floor ,saw strips.No new order.

## 2020-03-29 NOTE — Hospital Course (Addendum)
85 year old woman PMH paroxysmal atrial fibrillation presenting with recurrent syncope.  Had a syncopal episode in the emergency department telemetry was consistent with transient complete heart block. Admitted for SSS.  Complete heart block intermittently despite being off beta-blocker.  Seen by cardiology and electrophysiology, status post pacemaker placement 9/21.  Anticipate discharge home 9/22.

## 2020-03-29 NOTE — Assessment & Plan Note (Addendum)
--  cardiogenic; episode of CHB in ED and several on floor --was on BB, stopped on admit but still having epsidoes --echo LVEF 66-59%, grad I diastolic dysfunction, normal RN, moderate AV regurg --EKG SR RBBB (old) --carotid u/s w/o sig stenosis --EP > PPM today

## 2020-03-29 NOTE — Progress Notes (Signed)
PT Cancellation Note  Patient Details Name: NIKETA TURNER MRN: [id number] DOB: 1934-08-23   Cancelled Treatment:     pt going down to vascular. Will attempt later in PM, time permitting  Lyanne Co, DPT Acute Rehabilitation Services [phone]   [place] 03/29/2020, 9:35 AM

## 2020-03-29 NOTE — Progress Notes (Signed)
  Echocardiogram 2D Echocardiogram has been performed.  Bobbye Charleston 03/29/2020, 10:26 AM

## 2020-03-29 NOTE — Progress Notes (Signed)
PROGRESS NOTE  [name] OIN:[id number] DOB: 11-13-1934 DOA: 03/28/2020 PCP: [name], DO  Brief History   85 year old woman PMH paroxysmal atrial fibrillation presenting with recurrent syncope.  Had a syncopal episode in the emergency department telemetry was consistent with transient complete heart block. Admitted for SSS.    A & P  Syncope --cardiogenic; episode of CHB in ED; presyncope 9/20 w/ near 6s unconducted p waves c/w Mobitz II. --was on BB, stopped on admit --echo LVEF 70-96%, grad I diastolic dysfunction, normal RN, moderate AV regurg --EKG SR RBBB (old) --carotid u/s w/o sig stenosis --cardiology following and managing dysrhythmia   Benign essential hypertension --continue HCTZ and losartan  Hypothyroidism --TSH WNL --resume levothyroxine   Disposition Plan:  Discussion: continue to monitor rhythm off BB  Status is: Inpatient  Remains inpatient appropriate because:Inpatient level of care appropriate due to severity of illness   Dispo: The patient is from: Home              Anticipated d/c is to: Home              Anticipated d/c date is: 1 day              Patient currently is not medically stable to d/c.  DVT prophylaxis: enoxaparin (LOVENOX) injection 40 mg Start: 03/29/20 1000 SCDs Start: 03/28/20 1841   Code Status: Full Code Family Communication: daughter at bedside  Murray Hodgkins, MD  [facility] Direct contact: see [email or web address] (further directions at bottom of note if needed) 7PM-7AM contact night coverage as at bottom of note 03/29/2020, 3:29 PM  LOS: 1 day   Significant Hospital Events   .    Consults:  .    Procedures:  .   Significant Diagnostic Tests:  Marland Kitchen    Micro Data:  .    Antimicrobials:  .   Interval History/Subjective  CC: f/u syncope  Feels ok, had another episode of near syncope while in u/s. No pain. Breathing ok.  Review of Systems  Gastrointestinal: Negative for nausea and vomiting.     Objective   Vitals:  Vitals:   03/29/20 1144 03/29/20 1158  BP: (!) 184/69 (!) 189/74  Pulse: 68 75  Resp: 18   Temp: 97.9 F (36.6 C)   SpO2: 96% 97%    Exam:  Physical Exam Vitals reviewed.  Constitutional:      General: She is not in acute distress.    Appearance: Normal appearance. She is not ill-appearing.  Cardiovascular:     Rate and Rhythm: Normal rate and regular rhythm.     Comments: Telemetry w/ 5.75s ventricular pause w/ multiple unconducted p waves consistent w/ transient Mobitz II Musculoskeletal:     Right lower leg: No edema.     Left lower leg: No edema.  Neurological:     Mental Status: She is alert.  Psychiatric:        Mood and Affect: Mood normal.        Behavior: Behavior normal.    I have personally reviewed the following:   Today's Data  . K+ 3.4 . Creatinine 1.02 > 1.37, not far off . CBC WNL . TSH WNL  Scheduled Meds: . enoxaparin (LOVENOX) injection  40 mg Subcutaneous Daily  . gabapentin  100 mg Oral BID  . hydrochlorothiazide  12.5 mg Oral Daily  . hydroxychloroquine  200 mg Oral BID  . [START ON 03/30/2020] levothyroxine  112 mcg Oral QAC breakfast  . losartan  100  mg Oral Daily  . pantoprazole  40 mg Oral Daily  . sodium chloride flush  3 mL Intravenous Q12H   Continuous Infusions:  Principal Problem:   Syncope Active Problems:   Benign essential hypertension   Hypothyroidism   Class 1 obesity due to excess calories with serious comorbidity and body mass index (BMI) of 30.0 to 00.8 in adult   Follicular lymphoma (HCC)   GERD (gastroesophageal reflux disease)   Other hyperlipidemia   LOS: 1 day   How to contact the [facility] Attending or Consulting provider 7A - 7P or covering provider during after hours [place], for this patient?  1. Check the care team in [facility] and look for a) attending/consulting [facility] provider listed and b) the [facility] team listed 2. Log into [email or web address] and use [employer]'s universal password to access. If  you do not have the password, please contact the hospital operator. 3. Locate the [facility] provider you are looking for under [facility] and page to a number that you can be directly reached. 4. If you still have difficulty reaching the provider, please page the [facility] (Director on Call) for the Hospitalists listed on amion for assistance.

## 2020-03-29 NOTE — Progress Notes (Signed)
Progress Note  Patient Name: [name] Date of Encounter: 03/29/2020  [facility] Cardiologist: [place]  Subjective   No CP or dyspnea  Inpatient Medications    Scheduled Meds: . enoxaparin (LOVENOX) injection  40 mg Subcutaneous Daily  . sodium chloride flush  3 mL Intravenous Q12H   Continuous Infusions:  PRN Meds: acetaminophen **OR** acetaminophen, ondansetron **OR** ondansetron (ZOFRAN) IV   Vital Signs    Vitals:   03/29/20 0052 03/29/20 0218 03/29/20 0542 03/29/20 0736  BP:  (!) 156/64 (!) 150/56 (!) 159/58  Pulse:  66 71 66  Resp:  18 18 19   Temp:  97.8 F (36.6 C) 98.1 F (36.7 C) 97.8 F (36.6 C)  TempSrc:  Oral Oral Oral  SpO2:  99% 96% 96%  Weight: 84.5 kg  83.6 kg   Height:        Intake/Output Summary (Last 24 hours) at 03/29/2020 0835 Last data filed at 03/29/2020 0540 Gross per 24 hour  Intake 220 ml  Output 525 ml  Net -305 ml   Last 3 Weights 03/29/2020 03/29/2020 03/28/2020  Weight (lbs) 184 lb 3.2 oz 186 lb 3.2 oz 185 lb 6.5 oz  Weight (kg) 83.553 kg 84.46 kg 84.1 kg      Telemetry    Sinus- Personally Reviewed  Telemetry from emergency room reviewed and showed episode of complete heart block with 3-second pause.   Physical Exam   GEN: No acute distress.   Neck: No JVD Cardiac: RRR, no murmurs, rubs, or gallops.  Respiratory: Clear to auscultation bilaterally. GI: Soft, nontender, non-distended  MS: No edema Neuro:  Nonfocal  Psych: Normal affect   Labs    High Sensitivity Troponin:   Recent Labs  Lab 03/26/20 1450 03/26/20 1737 03/28/20 1325 03/28/20 1448  TROPONINIHS 10 10 10 9       Chemistry Recent Labs  Lab 03/26/20 1450 03/26/20 1450 03/28/20 1325 03/28/20 1902 03/29/20 0422  NA 136  --  135  --  137  K 3.9  --  3.6  --  3.4*  CL 103  --  97*  --  99  CO2 25  --  28  --  29  GLUCOSE 106*  --  107*  --  107*  BUN 32*  --  14  --  13  CREATININE 1.34*   < > 1.02* 1.10*  1.37*  CALCIUM 8.9  --  8.9  --  9.2  PROT 6.6  --  6.9  --  6.1*  ALBUMIN 3.7  --  3.8  --  3.3*  AST 22  --  19  --  19  ALT 16  --  17  --  16  ALKPHOS 38  --  35*  --  36*  BILITOT 1.0  --  0.9  --  1.1  GFRNONAA 37*   < > 51* 46* 36*  GFRAA 42*   < > 59* 54* 41*  ANIONGAP 8  --  10  --  9   < > = values in this interval not displayed.     Hematology Recent Labs  Lab 03/28/20 1325 03/28/20 1902 03/29/20 0422  WBC 6.7 6.0 5.5  RBC 4.32 4.27 4.21  HGB 12.5 12.3 12.3  HCT 37.8 36.9 37.0  MCV 87.5 86.4 87.9  MCH 28.9 28.8 29.2  MCHC 33.1 33.3 33.2  RDW 12.2 12.3 12.3  PLT 220 225 202    Radiology  CT Head Wo Contrast  Result Date: 03/28/2020 CLINICAL DATA:  Minor head trauma. Multiple recent syncopal episodes. EXAM: CT HEAD WITHOUT CONTRAST TECHNIQUE: Contiguous axial images were obtained from the base of the skull through the vertex without intravenous contrast. COMPARISON:  March 26, 2020 FINDINGS: Brain: No evidence of acute infarction, hemorrhage, hydrocephalus, extra-axial collection or mass lesion/mass effect. Vascular: No hyperdense vessel or unexpected calcification. Skull: Normal. Negative for fracture or focal lesion. Sinuses/Orbits: Scattered opacification in the frontal and ethmoid sinuses. There is a small amount of fluid in maxillary sinuses. Previous sinus surgery is identified. Mastoid air cells and middle ears are well aerated. Other: None. IMPRESSION: 1. Sinus disease as above. 2. No acute intracranial abnormalities. Electronically Signed   By: Dorise Bullion III M.D   On: 03/28/2020 14:43   DG Chest Portable 1 View  Result Date: 03/28/2020 CLINICAL DATA:  Syncope. EXAM: PORTABLE CHEST 1 VIEW COMPARISON:  March 26, 2020 FINDINGS: A transcutaneous pacer partially overlies and obscures the left medial upper chest. No pneumothorax. The heart, hila, mediastinum are unchanged. The lungs remain clear. Minimal atelectasis in the left base. IMPRESSION: No  active disease. Electronically Signed   By: Dorise Bullion III M.D   On: 03/28/2020 14:44    Patient Profile     85 y.o. female with past medical history hypertension and PVCs admitted with syncope.  Most recent echocardiogram March 2021 at [facility] showed normal LV function, mild aortic, mitral and tricuspid insufficiency.  In the [facility] emergency room she apparently had an episode and was noted to have a sinus pause with extreme bradycardia by report ("HR 12").  Beta-blocker has been discontinued.  Assessment & Plan    1 syncope-bradycardia mediated.  However patient is taking metoprolol 75 mg twice daily.  This has been held with no recurrences thus far.  Continue telemetry today.  Echocardiogram to assess LV function.  If no further episodes will likely plan outpatient monitor with close follow-up.  If she has episodes despite holding metoprolol would need pacemaker.  2 hypertension-blood pressure elevated.  Will resume losartan 100 mg daily and hydrochlorothiazide 12.5 mg daily.  Follow blood pressure and adjust regimen as needed.  For questions or updates, please contact [place] Please consult [email or web address] for contact info under        Signed, Kirk Ruths, MD  03/29/2020, 8:35 AM

## 2020-03-29 NOTE — Progress Notes (Signed)
OT Cancellation Note  Patient Details Name: NATTIE LAZENBY MRN: [id number] DOB: 1934-11-26   Cancelled Treatment:    Reason Eval/Treat Not Completed: Medical issues which prohibited therapy.  BP remains elevated:  185/76.  Continue efforts in the acute setting.    Sherlyne Crownover D Shaynna Husby 03/29/2020, 2:46 PM  03/29/2020  Denice Paradise, OTR/L  Acute Rehabilitation Services  Office:  [phone]

## 2020-03-29 NOTE — Evaluation (Signed)
Physical Therapy Evaluation Patient Details Name: [name] MRN: [id number] DOB: [birth year] Today's Date: 03/29/2020   History of Present Illness  85 yr old with PAF transferred for dizziness and syncopal episodes.  Patient has history of A. fib, Mnire's, hypertension who presents to the ED after multiple syncopal episodes.  She states that she has been having episodes of syncope since last Friday.  Episodes last about 20 to 30 seconds with loss of consciousness and comes back quickly.  She feels weak following this.  Her daughter as visit witnessed these episodes and states that there was no seizures or any postictal state.  She had an episode while in the ER at [facility] where it was noted that she had sinus pause with extreme bradycardia. Pt states she can tell before the episode is going to happen.  Clinical Impression   Pt is seen in bed with daughter present; pt limited to bed level activity due to high BP supine (189/74) (RN present at end of session to give meds); pt I with bed mobility; pt and daughter state she has no weakness or balance deficits prior to admission; pt has 14 stairs in the house she does perform to do laundry; pt will benefit from additional skilled PT to perform OOB activities and assess high level balance activities to maximize independence with functional mobility prior to discharge.     Follow Up Recommendations No PT follow up    Equipment Recommendations  None recommended by PT    Recommendations for Other Services       Precautions / Restrictions Precautions Precautions: Fall Restrictions Weight Bearing Restrictions: No Other Position/Activity Restrictions: monitor BP and HR      Mobility  Bed Mobility Overal bed mobility: Independent             General bed mobility comments: performed rolling and scooting up in bed, pt I  Transfers                 General transfer comment: unable to assess due to high BP  Ambulation/Gait              General Gait Details: unable to assess due to high BP  Stairs            Wheelchair Mobility    Modified Rankin (Stroke Patients Only)       Balance                                             Pertinent Vitals/Pain Pain Assessment: No/denies pain    Home Living Family/patient expects to be discharged to:: Private residence Living Arrangements: Children Available Help at Discharge: Family Type of Home: House Home Access: Stairs to enter   Technical brewer of Steps: 1 Home Layout: Two level;Able to live on main level with bedroom/bathroom Home Equipment: Walker - 4 wheels;Shower seat;Grab bars - tub/shower;Grab bars - toilet      Prior Function Level of Independence: Independent               Hand Dominance   Dominant Hand: Right    Extremity/Trunk Assessment   Upper Extremity Assessment Upper Extremity Assessment: Defer to OT evaluation    Lower Extremity Assessment Lower Extremity Assessment: Overall WFL for tasks assessed (performed movement in bed; LE [facility] for bed mobility)       Communication  Communication: No difficulties  Cognition Arousal/Alertness: Awake/alert Behavior During Therapy: WFL for tasks assessed/performed                                          General Comments General comments (skin integrity, edema, etc.): unable to perform sitting EOB activities due to high BP; pt states she has been getting up with nursing to go to the bathroom and doesn't require any assist    Exercises     Assessment/Plan    PT Assessment Patient needs continued PT services  PT Problem List Decreased balance       PT Treatment Interventions Therapeutic activities    PT Goals (Current goals can be found in the Care Plan section)  Acute Rehab PT Goals Patient Stated Goal: to go home PT Goal Formulation: With patient/family Time For Goal Achievement: 04/12/20 Potential to Achieve  Goals: Good    Frequency Min 3X/week   Barriers to discharge        Co-evaluation               AM-PAC PT "6 Clicks" Mobility  Outcome Measure Help needed turning from your back to your side while in a flat bed without using bedrails?: None Help needed moving from lying on your back to sitting on the side of a flat bed without using bedrails?: None Help needed moving to and from a bed to a chair (including a wheelchair)?: A Little Help needed standing up from a chair using your arms (e.g., wheelchair or bedside chair)?: A Little Help needed to walk in hospital room?: A Little Help needed climbing 3-5 steps with a railing? : A Little 6 Click Score: 20    End of Session   Activity Tolerance: Treatment limited secondary to medical complications (Comment) Patient left: in bed;with call bell/phone within reach;with bed alarm set;with family/visitor present Nurse Communication: Mobility status PT Visit Diagnosis: Difficulty in walking, not elsewhere classified (R26.2)    Time: 1136-1150 PT Time Calculation (min) (ACUTE ONLY): 14 min   Charges:   PT Evaluation $PT Eval Low Complexity: 1 Low          Lyanne Co, DPT Acute Rehabilitation Services [phone]  [place] 03/29/2020, 12:05 PM

## 2020-03-30 ENCOUNTER — Inpatient Hospital Stay (HOSPITAL_COMMUNITY)
Admission: EM | Disposition: A | Discharge status: Home / Self Care | Payer: Self-pay | Source: Home / Self Care | Attending: Family Medicine

## 2020-03-30 ENCOUNTER — Encounter (HOSPITAL_COMMUNITY): Payer: Self-pay | Admitting: Internal Medicine

## 2020-03-30 DIAGNOSIS — I442 Atrioventricular block, complete: Secondary | ICD-10-CM

## 2020-03-30 HISTORY — PX: PACEMAKER IMPLANT: EP1218

## 2020-03-30 LAB — BASIC METABOLIC PANEL
Anion gap: 6 (ref 5–15)
BUN: 14 mg/dL (ref 8–23)
CO2: 31 mmol/L (ref 22–32)
Calcium: 9 mg/dL (ref 8.9–10.3)
Chloride: 98 mmol/L (ref 98–111)
Creatinine, Ser: 1.2 mg/dL — ABNORMAL HIGH (ref 0.44–1.00)
GFR calc Af Amer: 48 mL/min — ABNORMAL LOW (ref >60)
GFR calc non Af Amer: 42 mL/min — ABNORMAL LOW (ref >60)
Glucose, Bld: 107 mg/dL — ABNORMAL HIGH (ref 70–99)
Potassium: 3.3 mmol/L — ABNORMAL LOW (ref 3.5–5.1)
Sodium: 135 mmol/L (ref 135–145)

## 2020-03-30 LAB — SURGICAL PCR SCREEN
MRSA, PCR: NEGATIVE
Staphylococcus aureus: POSITIVE — AB

## 2020-03-30 LAB — FLECAINIDE LEVEL: Flecainide: <0.1 ug/mL — ABNORMAL LOW (ref 0.20–1.00)

## 2020-03-30 LAB — GLUCOSE, CAPILLARY: Glucose-Capillary: 98 mg/dL (ref 70–99)

## 2020-03-30 SURGERY — PACEMAKER IMPLANT

## 2020-03-30 MED ORDER — SODIUM CHLORIDE 0.9 % IV SOLN: 250.0000 mL | INTRAVENOUS | Status: DC | PRN

## 2020-03-30 MED ORDER — CEFAZOLIN SODIUM-DEXTROSE 1-4 GM/50ML-% IV SOLN
1.0000 g | Freq: Four times a day (QID) | INTRAVENOUS | Status: AC
  Administered 2020-03-30 – 2020-03-31 (×3): 1 g via INTRAVENOUS
  Filled 2020-03-30 (×3): qty 50

## 2020-03-30 MED ORDER — SODIUM CHLORIDE 0.9 % IV SOLN: INTRAVENOUS | Status: DC

## 2020-03-30 MED ORDER — POTASSIUM CHLORIDE CRYS ER 20 MEQ PO TBCR
40.0000 meq | EXTENDED_RELEASE_TABLET | Freq: Two times a day (BID) | ORAL | Status: AC
  Administered 2020-03-30 (×2): 40 meq via ORAL
  Filled 2020-03-30 (×2): qty 2

## 2020-03-30 MED ORDER — SODIUM CHLORIDE 0.9 % IV SOLN
INTRAVENOUS | Status: AC
  Filled 2020-03-30: qty 2

## 2020-03-30 MED ORDER — POLYVINYL ALCOHOL 1.4 % OP SOLN
1.0000 [drp] | OPHTHALMIC | Status: DC | PRN
  Filled 2020-03-30: qty 15

## 2020-03-30 MED ORDER — LIDOCAINE HCL 1 % IJ SOLN
INTRAMUSCULAR | Status: AC
  Filled 2020-03-30: qty 60

## 2020-03-30 MED ORDER — CHLORHEXIDINE GLUCONATE 4 % EX LIQD
60.0000 mL | Freq: Once | CUTANEOUS | Status: AC
  Administered 2020-03-30: 4 via TOPICAL
  Filled 2020-03-30: qty 60

## 2020-03-30 MED ORDER — CEFAZOLIN SODIUM-DEXTROSE 2-4 GM/100ML-% IV SOLN
INTRAVENOUS | Status: AC
  Filled 2020-03-30: qty 100

## 2020-03-30 MED ORDER — ACETAMINOPHEN 325 MG PO TABS
325.0000 mg | ORAL_TABLET | ORAL | Status: DC | PRN
  Administered 2020-03-30 – 2020-03-31 (×2): 650 mg via ORAL
  Filled 2020-03-30 (×2): qty 2

## 2020-03-30 MED ORDER — FENTANYL CITRATE (PF) 100 MCG/2ML IJ SOLN
INTRAMUSCULAR | Status: DC | PRN
Start: 2020-03-30 — End: 2020-03-30
  Administered 2020-03-30: 25 ug via INTRAVENOUS
  Administered 2020-03-30: 12.5 ug via INTRAVENOUS

## 2020-03-30 MED ORDER — HEPARIN (PORCINE) IN NACL 1000-0.9 UT/500ML-% IV SOLN
INTRAVENOUS | Status: DC | PRN
  Administered 2020-03-30: 500 mL

## 2020-03-30 MED ORDER — ONDANSETRON HCL 4 MG/2ML IJ SOLN: 4.0000 mg | Freq: Four times a day (QID) | INTRAMUSCULAR | Status: DC | PRN

## 2020-03-30 MED ORDER — CEFAZOLIN SODIUM-DEXTROSE 2-3 GM-%(50ML) IV SOLR
INTRAVENOUS | Status: AC | PRN
  Administered 2020-03-30: 2 g via INTRAVENOUS

## 2020-03-30 MED ORDER — SODIUM CHLORIDE 0.9% FLUSH: 3.0000 mL | INTRAVENOUS | Status: DC | PRN

## 2020-03-30 MED ORDER — SODIUM CHLORIDE 0.9% FLUSH
3.0000 mL | Freq: Two times a day (BID) | INTRAVENOUS | Status: DC
  Administered 2020-03-30 – 2020-03-31 (×2): 3 mL via INTRAVENOUS

## 2020-03-30 MED ORDER — MIDAZOLAM HCL 5 MG/5ML IJ SOLN
INTRAMUSCULAR | Status: AC
  Filled 2020-03-30: qty 5

## 2020-03-30 MED ORDER — LIDOCAINE HCL (PF) 1 % IJ SOLN
INTRAMUSCULAR | Status: DC | PRN
  Administered 2020-03-30: 60 mL

## 2020-03-30 MED ORDER — IOHEXOL 350 MG/ML SOLN
INTRAVENOUS | Status: DC | PRN
  Administered 2020-03-30: 15 mL

## 2020-03-30 MED ORDER — AMLODIPINE BESYLATE 5 MG PO TABS
5.0000 mg | ORAL_TABLET | Freq: Every day | ORAL | Status: DC
  Administered 2020-03-30 – 2020-03-31 (×2): 5 mg via ORAL
  Filled 2020-03-30 (×2): qty 1

## 2020-03-30 MED ORDER — HEPARIN (PORCINE) IN NACL 1000-0.9 UT/500ML-% IV SOLN
INTRAVENOUS | Status: AC
  Filled 2020-03-30: qty 500

## 2020-03-30 MED ORDER — HYDROCODONE-ACETAMINOPHEN 5-325 MG PO TABS: 1.0000 | ORAL_TABLET | ORAL | Status: DC | PRN

## 2020-03-30 MED ORDER — FENTANYL CITRATE (PF) 100 MCG/2ML IJ SOLN
INTRAMUSCULAR | Status: AC
  Filled 2020-03-30: qty 2

## 2020-03-30 MED ORDER — MIDAZOLAM HCL 5 MG/5ML IJ SOLN
INTRAMUSCULAR | Status: DC | PRN
  Administered 2020-03-30 (×3): 1 mg via INTRAVENOUS

## 2020-03-30 MED ORDER — CEFAZOLIN SODIUM-DEXTROSE 2-4 GM/100ML-% IV SOLN
2.0000 g | INTRAVENOUS | Status: AC
  Administered 2020-03-30: 2 g via INTRAVENOUS

## 2020-03-30 MED ORDER — SODIUM CHLORIDE 0.9 % IV SOLN
80.0000 mg | INTRAVENOUS | Status: AC
  Administered 2020-03-30: 80 mg

## 2020-03-30 SURGICAL SUPPLY — 12 items

## 2020-03-30 NOTE — H&P (View-Only) (Signed)
ELECTROPHYSIOLOGY CONSULT NOTE    Patient ID: [name] MRN: [id number], DOB/AGE: 1935/05/24 85 y.o.  Admit date: 03/28/2020 Date of Consult: 03/30/2020  Primary Physician: [name], DO Primary Cardiologist: [facility])  Electrophysiologist: New  Referring Provider: Dr. Stanford Breed  Patient Profile: [name] is a 85 y.o. female with a history of PAF, HTN, and Menieres disease who is being seen today for the evaluation of symptomatic bradycardia at the request of Dr. Stanford Breed.  HPI:  [name] is a 85 y.o. female with medical history above. Pt has been followed by Dr. Remus Blake on Metoprolol for PAF (previously on flecainide.    Pt reports episodes of syncope since Friday, with LOC of about 20-30 seconds but she "comes back quickly" and feels weak for a short period following this.  Her daughter has witnessed multiple of these episodes and denies seizure like activity or postictal state.  Pt reportedly had an episode in the ER at [facility]. Reports state she had a sinus pause with extreme bradycardia as low as "12 bpm". Mild 3+ second pauses noted on tele here. She was transferred to [place] for further evaluation.   She states she has been having symptoms for approximately six weeks, culminating in syncope this previous Friday. She was seen in ED and cleared. Had a non-eventful day Saturday, with only one episode of near syncope.  Sunday am she had 5 episodes first thing in the morning and was taken to [place]. Transient CHB noted on tele and transferred to [facility] as above.  She is feeling OK this am. Fatigued. She is having increasing palpitations, and has noted her PVCs have increased as she has washed out from metoprolol. She denies chest pain. She has PVC ablation planned with Dr. Remus Blake, not currently scheduled. She had a stress test planned for tomorrow to see if this would incite further PVCs for better mapping, per pt and daughter.   Past Medical  History:  Diagnosis Date  . Arthritis   . Asthma   . Atrial fibrillation (HCC)    paroxysmal , addressed  with metoprolol   . Chronic sinusitis   . Complication of anesthesia   . Dyspnea    sob on exertion, reports at her pre-op today 09-13-2018 that her cardiologist has planned for her to do a ECHO for evaluation   . Family history of adverse reaction to anesthesia    sister is hard to wake up from Anesthesia  . Follicular lymphoma ([place])    received Chemotherapy (2 years ago)  and radiation (last 12/2017)  . Hyperlipidemia   . Hypertension   . Hypothyroidism   . Meniere's disease   . PONV (postoperative nausea and vomiting)   . Primary localized osteoarthritis of left knee 09/24/2018  . Primary localized osteoarthritis of right knee 06/18/2018  . Reflux      Surgical History:  Past Surgical History:  Procedure Laterality Date  . ABDOMINAL HYSTERECTOMY    . BONE MARROW BIOPSY  06/16/2014   at [place]     bilateral  . CHOLECYSTECTOMY    . KNEE ARTHROSCOPY    . PARTIAL KNEE ARTHROPLASTY Right 06/18/2018   Procedure: UNICOMPARTMENTAL KNEE;  Surgeon: Marchia Bond, MD;  Location: [facility] ORS;  Service: Orthopedics;  Laterality: Right;  . PARTIAL KNEE ARTHROPLASTY Left 09/24/2018   Procedure: UNICOMPARTMENTAL KNEE;  Surgeon: Marchia Bond, MD;  Location: [facility] ORS;  Service: Orthopedics;  Laterality: Left;  Medications Prior to Admission  Medication Sig Dispense Refill Last Dose  . acyclovir ointment (ZOVIRAX) 5 % Apply 1 application topically daily as needed (for cold sores). 15 g 3 UNK  . amLODipine (NORVASC) 5 MG tablet Take 1 tablet (5 mg total) by mouth daily. 90 tablet 1 03/28/2020 at Unknown time  . aspirin EC 81 MG tablet Take 81 mg by mouth daily.   03/28/2020 at Unknown time  . baclofen (LIORESAL) 10 MG tablet Take 1 tablet (10 mg total) by mouth 3 (three) times daily as needed (muscle spasms). 30 tablet 0 Past Week at Unknown time  . cephALEXin  (KEFLEX) 500 MG capsule Take 1 capsule (500 mg total) by mouth 2 (two) times daily for 7 days. 14 capsule 0 03/28/2020 at Unknown time  . fluticasone (FLOVENT HFA) 110 MCG/ACT inhaler Inhale 2 puffs into the lungs 2 (two) times daily as needed (for shortness of breath or wheezing). 3 Inhaler 3 Past Week at Unknown time  . gabapentin (NEURONTIN) 100 MG capsule Take 100 mg by mouth 2 (two) times daily.    03/28/2020 at Unknown time  . hydrochlorothiazide (HYDRODIURIL) 25 MG tablet Take 1 tablet (25 mg total) by mouth daily. 90 tablet 1 03/28/2020 at Unknown time  . hydroxychloroquine (PLAQUENIL) 200 MG tablet Take 200 mg by mouth 2 (two) times daily.    03/28/2020 at Unknown time  . ipratropium (ATROVENT) 0.03 % nasal spray Place 2 sprays into both nostrils daily as needed for rhinitis (severe nasal drip).    Past Month at Unknown time  . isosorbide mononitrate (IMDUR) 30 MG 24 hr tablet Take 1 tablet (30 mg total) by mouth daily. 90 tablet 3 03/28/2020 at Unknown time  . levothyroxine (SYNTHROID) 112 MCG tablet TAKE 1 TABLET BY MOUTH  DAILY BEFORE BREAKFAST (Patient taking differently: Take 112 mcg by mouth daily before breakfast. ) 90 tablet 3 03/28/2020 at Unknown time  . losartan (COZAAR) 100 MG tablet Take 1 tablet (100 mg total) by mouth at bedtime. 90 tablet 3 03/27/2020  . meclizine (ANTIVERT) 25 MG tablet Take 1 tablet (25 mg total) by mouth 3 (three) times daily as needed for dizziness. 30 tablet 0 Past Month at Unknown time  . meloxicam (MOBIC) 15 MG tablet Take 15 mg by mouth daily as needed for pain.   UNK  . metoprolol tartrate (LOPRESSOR) 50 MG tablet Take 75 mg by mouth daily.   03/28/2020 at 0800  . omeprazole (PRILOSEC) 40 MG capsule TAKE 1 CAPSULE BY MOUTH IN  THE MORNING AND MAY REPEAT  DOSE IN THE EVENING AS  NEEDED FOR ACID REFLUX /  INDIGESTION (Patient taking differently: Take 40 mg by mouth See admin instructions. Take 67m by mouth once daily, may take an additional 467min the evening as  needed for acid reflux/indigestion.) 180 capsule 3 03/28/2020 at Unknown time  . ondansetron (ZOFRAN) 4 MG tablet Take 1 tablet (4 mg total) by mouth every 8 (eight) hours as needed for nausea or vomiting. 10 tablet 0 Past Week at Unknown time  . pravastatin (PRAVACHOL) 80 MG tablet TAKE 1 TABLET BY MOUTH AT  BEDTIME (Patient taking differently: Take 80 mg by mouth at bedtime. ) 90 tablet 3 03/27/2020  . sennosides-docusate sodium (SENOKOT-S) 8.6-50 MG tablet Take 2 tablets by mouth daily. (Patient taking differently: Take 2 tablets by mouth daily as needed (constipation.). ) 30 tablet 1 UNK  . tetrahydrozoline (VISINE) 0.05 % ophthalmic solution Place 2 drops into both eyes daily  as needed (for dry eyes).   Past Month at Unknown time  . triamcinolone (NASACORT ALLERGY 24HR) 55 MCG/ACT AERO nasal inhaler Place 2 sprays into the nose daily. (Patient taking differently: Place 2 sprays into the nose daily as needed (allergies.). ) 3 Inhaler 3 03/28/2020 at Unknown time    Inpatient Medications:  . amLODipine  5 mg Oral Daily  . enoxaparin (LOVENOX) injection  40 mg Subcutaneous Daily  . gabapentin  100 mg Oral BID  . hydrochlorothiazide  12.5 mg Oral Daily  . hydroxychloroquine  200 mg Oral BID  . levothyroxine  112 mcg Oral Q0600  . losartan  100 mg Oral Daily  . pantoprazole  40 mg Oral Daily  . potassium chloride  40 mEq Oral BID  . pravastatin  80 mg Oral QHS  . sodium chloride flush  3 mL Intravenous Q12H    Allergies: No Known Allergies  Social History   Socioeconomic History  . Marital status: Widowed    Spouse name: Not on file  . Number of children: 1  . Years of education: 92  . Highest education level: 12th grade  Occupational History  . Occupation: Theatre manager    Comment: retired  Tobacco Use  . Smoking status: Never Smoker  . Smokeless tobacco: Never Used  Vaping Use  . Vaping Use: Never used  Substance and Sexual Activity  . Alcohol use: No  . Drug use: No  .  Sexual activity: Not Currently    Partners: Male  Other Topics Concern  . Not on file  Social History Narrative  . Not on file   Social Determinants of Health   Financial Resource Strain:   . Difficulty of Paying Living Expenses: Not on file  Food Insecurity:   . Worried About Charity fundraiser in the Last Year: Not on file  . Ran Out of Food in the Last Year: Not on file  Transportation Needs:   . Lack of Transportation (Medical): Not on file  . Lack of Transportation (Non-Medical): Not on file  Physical Activity:   . Days of Exercise per Week: Not on file  . Minutes of Exercise per Session: Not on file  Stress:   . Feeling of Stress : Not on file  Social Connections:   . Frequency of Communication with Friends and Family: Not on file  . Frequency of Social Gatherings with Friends and Family: Not on file  . Attends Religious Services: Not on file  . Active Member of Clubs or Organizations: Not on file  . Attends Archivist Meetings: Not on file  . Marital Status: Not on file  Intimate Partner Violence:   . Fear of Current or Ex-Partner: Not on file  . Emotionally Abused: Not on file  . Physically Abused: Not on file  . Sexually Abused: Not on file     Family History  Problem Relation Age of Onset  . Heart disease Other   . Skin cancer Mother   . Stroke Mother   . Prostate cancer Father   . High blood pressure Father   . Heart attack Father      Review of Systems: All other systems reviewed and are otherwise negative except as noted above.  Physical Exam: Vitals:   03/29/20 1955 03/29/20 2356 03/30/20 0159 03/30/20 0520  BP: (!) 159/74 (!) 149/56  (!) 167/69  Pulse: 74 65  79  Resp: '18 20  20  ' Temp: 98 F (36.7 C) 98.1 F (  36.7 C)  97.8 F (36.6 C)  TempSrc: Oral Oral  Oral  SpO2: 98% 96%  97%  Weight:   84.8 kg   Height:        GEN- The patient is well appearing, alert and oriented x 3 today.   HEENT: normocephalic, atraumatic; sclera  clear, conjunctiva pink; hearing intact; oropharynx clear; neck supple Lungs- Clear to ausculation bilaterally, normal work of breathing.  No wheezes, rales, rhonchi Heart- Regular rate and rhythm, no murmurs, rubs or gallops GI- soft, non-tender, non-distended, bowel sounds present Extremities- no clubbing, cyanosis, or edema; DP/PT/radial pulses 2+ bilaterally MS- no significant deformity or atrophy Skin- warm and dry, no rash or lesion Psych- euthymic mood, full affect Neuro- strength and sensation are intact  Labs:   Lab Results  Component Value Date   WBC 5.5 03/29/2020   HGB 12.3 03/29/2020   HCT 37.0 03/29/2020   MCV 87.9 03/29/2020   PLT 202 03/29/2020    Recent Labs  Lab 03/29/20 0422 03/29/20 0422 03/30/20 0343  NA 137   < > 135  K 3.4*   < > 3.3*  CL 99   < > 98  CO2 29   < > 31  BUN 13   < > 14  CREATININE 1.37*   < > 1.20*  CALCIUM 9.2   < > 9.0  PROT 6.1*  --   --   BILITOT 1.1  --   --   ALKPHOS 36*  --   --   ALT 16  --   --   AST 19  --   --   GLUCOSE 107*   < > 107*   < > = values in this interval not displayed.      Radiology/Studies: CT Head Wo Contrast  Result Date: 03/28/2020 CLINICAL DATA:  Minor head trauma. Multiple recent syncopal episodes. EXAM: CT HEAD WITHOUT CONTRAST TECHNIQUE: Contiguous axial images were obtained from the base of the skull through the vertex without intravenous contrast. COMPARISON:  March 26, 2020 FINDINGS: Brain: No evidence of acute infarction, hemorrhage, hydrocephalus, extra-axial collection or mass lesion/mass effect. Vascular: No hyperdense vessel or unexpected calcification. Skull: Normal. Negative for fracture or focal lesion. Sinuses/Orbits: Scattered opacification in the frontal and ethmoid sinuses. There is a small amount of fluid in maxillary sinuses. Previous sinus surgery is identified. Mastoid air cells and middle ears are well aerated. Other: None. IMPRESSION: 1. Sinus disease as above. 2. No acute  intracranial abnormalities. Electronically Signed   By: Dorise Bullion III M.D   On: 03/28/2020 14:43   CT Head Wo Contrast  Result Date: 03/26/2020 CLINICAL DATA:  Syncope at the EXAM: CT HEAD WITHOUT CONTRAST TECHNIQUE: Contiguous axial images were obtained from the base of the skull through the vertex without intravenous contrast. COMPARISON:  None. FINDINGS: Brain: No evidence of acute territorial infarction, hemorrhage, hydrocephalus,extra-axial collection or mass lesion/mass effect. The ventricles are normal in size and contour. There is mild low-attenuation changes in the deep white matter consistent with small vessel ischemia. Vascular: No hyperdense vessel or unexpected calcification. Skull: The skull is intact. No fracture or focal lesion identified. Sinuses/Orbits: Mucosal thickening seen within the ethmoid air cells and maxillary sinuses. The the the orbits and globes intact. Other: None IMPRESSION: No acute intracranial abnormality. Electronically Signed   By: Prudencio Pair M.D.   On: 03/26/2020 15:19   DG Chest Portable 1 View  Result Date: 03/28/2020 CLINICAL DATA:  Syncope. EXAM: PORTABLE CHEST 1 VIEW COMPARISON:  March 26, 2020 FINDINGS: A transcutaneous pacer partially overlies and obscures the left medial upper chest. No pneumothorax. The heart, hila, mediastinum are unchanged. The lungs remain clear. Minimal atelectasis in the left base. IMPRESSION: No active disease. Electronically Signed   By: Dorise Bullion III M.D   On: 03/28/2020 14:44   DG Chest Portable 1 View  Result Date: 03/26/2020 CLINICAL DATA:  Weakness EXAM: PORTABLE CHEST 1 VIEW COMPARISON:  Radiograph 03/03/2019 FINDINGS: Some coarsened interstitial and bronchitic features are similar to priors. Some hazy opacity towards the lung bases likely reflecting some atelectatic change and overlying tissue. Mild central vascular congestion without features of frank edema. The aorta is calcified. The remaining  cardiomediastinal contours are unremarkable. No acute osseous or soft tissue abnormality. Degenerative changes are present in the imaged spine and shoulders. Telemetry leads overlie the chest. IMPRESSION: 1. Basilar atelectasis. 2. Chronic bronchitic features. 3. Mild central vascular congestion without features of frank edema. 4.  Aortic Atherosclerosis (ICD10-I70.0). Electronically Signed   By: Lovena Le M.D.   On: 03/26/2020 16:21   ECHOCARDIOGRAM COMPLETE  Result Date: 03/29/2020    ECHOCARDIOGRAM REPORT   Patient Name:   JASLYNE BEECK Date of Exam: 03/29/2020 Medical Rec #:  [id number]     Height:       65.0 in Accession #:    [id number]    Weight:       184.2 lb Date of Birth:  Dec 10, 1934     BSA:          1.910 m Patient Age:    70 years      BP:           159/98 mmHg Patient Gender: F             HR:           63 bpm. Exam Location:  Inpatient Procedure: 2D Echo, 3D Echo, Cardiac Doppler, Color Doppler and Strain Analysis Indications:    R55 Syncope  History:        Patient has no prior history of Echocardiogram examinations.                 Abnormal ECG, Arrythmias:Atrial Fibrillation,                 Signs/Symptoms:Dyspnea and Syncope; Risk Factors:Hypertension.                 Lymphoma.  Sonographer:    Roseanna Rainbow RDCS Referring Phys: [id number] [place]  Sonographer Comments: Technically difficult study due to poor echo windows. Image acquisition challenging due to respiratory motion and Image acquisition challenging due to patient body habitus. Global longitudinal strain was attempted. IMPRESSIONS  1. Left ventricular ejection fraction, by estimation, is 60 to 65%. The left ventricle has normal function. The left ventricle has no regional wall motion abnormalities. There is mild asymmetric left ventricular hypertrophy of the septal segment. Left ventricular diastolic parameters are consistent with Grade I diastolic dysfunction (impaired relaxation). Elevated left ventricular end-diastolic  pressure. The average left ventricular global longitudinal strain is -18.1 %. The global longitudinal strain  is normal.  2. Right ventricular systolic function is normal. The right ventricular size is normal.  3. The mitral valve is normal in structure. Mild mitral valve regurgitation. No evidence of mitral stenosis.  4. The aortic valve was not well visualized. There is mild calcification of the aortic valve. There is moderate thickening of the aortic valve. Aortic valve regurgitation is moderate. Mild to  moderate aortic valve sclerosis/calcification is present, without any evidence of aortic stenosis. Aortic regurgitation PHT measures 435 msec.  5. Aortic dilatation noted. There is mild dilatation of the ascending aorta, measuring 38 mm.  6. The inferior vena cava is normal in size with greater than 50% respiratory variability, suggesting right atrial pressure of 3 mmHg. FINDINGS  Left Ventricle: Left ventricular ejection fraction, by estimation, is 60 to 65%. The left ventricle has normal function. The left ventricle has no regional wall motion abnormalities. The average left ventricular global longitudinal strain is -18.1 %. The global longitudinal strain is normal. The left ventricular internal cavity size was normal in size. There is mild asymmetric left ventricular hypertrophy of the septal segment. Left ventricular diastolic parameters are consistent with Grade I diastolic dysfunction (impaired relaxation). Elevated left ventricular end-diastolic pressure. Right Ventricle: The right ventricular size is normal. No increase in right ventricular wall thickness. Right ventricular systolic function is normal. Left Atrium: Left atrial size was normal in size. Right Atrium: Right atrial size was normal in size. Pericardium: There is no evidence of pericardial effusion. Mitral Valve: The mitral valve is normal in structure. Mild mitral valve regurgitation. No evidence of mitral valve stenosis. Tricuspid Valve: The  tricuspid valve is normal in structure. Tricuspid valve regurgitation is not demonstrated. No evidence of tricuspid stenosis. Aortic Valve: The aortic valve was not well visualized. There is mild calcification of the aortic valve. There is moderate thickening of the aortic valve. There is moderate aortic valve annular calcification. Aortic valve regurgitation is moderate. Aortic regurgitation PHT measures 435 msec. Mild to moderate aortic valve sclerosis/calcification is present, without any evidence of aortic stenosis. Pulmonic Valve: The pulmonic valve was normal in structure. Pulmonic valve regurgitation is not visualized. No evidence of pulmonic stenosis. Aorta: Aortic dilatation noted. There is mild dilatation of the ascending aorta, measuring 38 mm. Venous: The inferior vena cava is normal in size with greater than 50% respiratory variability, suggesting right atrial pressure of 3 mmHg. IAS/Shunts: No atrial level shunt detected by color flow Doppler.  LEFT VENTRICLE PLAX 2D LVIDd:         5.40 cm     Diastology LVIDs:         3.70 cm     LV e' medial:    3.68 cm/s LV PW:         1.00 cm     LV E/e' medial:  18.9 LV IVS:        1.20 cm     LV e' lateral:   8.76 cm/s LVOT diam:     2.10 cm     LV E/e' lateral: 7.9 LV SV:         99 LV SV Index:   52          2D Longitudinal Strain LVOT Area:     3.46 cm    2D Strain GLS (A2C):   -14.4 %                            2D Strain GLS (A3C):   -20.2 %                            2D Strain GLS (A4C):   -19.6 % LV Volumes (MOD)           2D Strain GLS Avg:     -18.1 % LV vol d, MOD  A2C: 86.8 ml LV vol d, MOD A4C: 85.6 ml LV vol s, MOD A2C: 44.1 ml LV vol s, MOD A4C: 25.1 ml LV SV MOD A2C:     42.7 ml LV SV MOD A4C:     85.6 ml LV SV MOD BP:      55.9 ml RIGHT VENTRICLE            IVC RV S prime:     9.99 cm/s  IVC diam: 2.50 cm TAPSE (M-mode): 2.6 cm LEFT ATRIUM             Index       RIGHT ATRIUM           Index LA diam:        3.80 cm 1.99 cm/m  RA Area:     18.70  cm LA Vol (A2C):   40.3 ml 21.10 ml/m RA Volume:   50.50 ml  26.44 ml/m LA Vol (A4C):   36.4 ml 19.06 ml/m LA Biplane Vol: 39.1 ml 20.47 ml/m  AORTIC VALVE LVOT Vmax:   127.00 cm/s LVOT Vmean:  78.800 cm/s LVOT VTI:    0.287 m AI PHT:      435 msec  AORTA Ao Root diam: 3.10 cm Ao Asc diam:  3.80 cm MITRAL VALVE MV Area (PHT): 2.54 cm    SHUNTS MV Decel Time: 299 msec    Systemic VTI:  0.29 m MV E velocity: 69.40 cm/s  Systemic Diam: 2.10 cm MV A velocity: 92.50 cm/s MV E/A ratio:  0.75 Fransico Him MD Electronically signed by Fransico Him MD Signature Date/Time: 03/29/2020/10:30:56 AM    Final    VAS US CAROTID  Result Date: 03/29/2020 Carotid Arterial Duplex Study Indications:  Syncope and Weakness. Risk Factors: Hypertension, hyperlipidemia. Performing Technologist: Griffin Basil RCT RDMS  Examination Guidelines: A complete evaluation includes B-mode imaging, spectral Doppler, color Doppler, and power Doppler as needed of all accessible portions of each vessel. Bilateral testing is considered an integral part of a complete examination. Limited examinations for reoccurring indications may be performed as noted.  Right Carotid Findings: +----------+--------+--------+--------+------------------+------------------+           PSV cm/sEDV cm/sStenosisPlaque DescriptionComments           +----------+--------+--------+--------+------------------+------------------+ CCA Prox  77      13                                                   +----------+--------+--------+--------+------------------+------------------+ CCA Distal71      10                                intimal thickening +----------+--------+--------+--------+------------------+------------------+ ICA Prox  86      15      1-39%                     intimal thickening +----------+--------+--------+--------+------------------+------------------+ ICA Distal61      15                                                    +----------+--------+--------+--------+------------------+------------------+ ECA       75  1                                                    +----------+--------+--------+--------+------------------+------------------+ +----------+--------+-------+--------+-------------------+           PSV cm/sEDV cmsDescribeArm Pressure (mmHG) +----------+--------+-------+--------+-------------------+ [id number]     1                                  +----------+--------+-------+--------+-------------------+ +---------+--------+--+--------+--+---------+ VertebralPSV cm/s43EDV cm/s12Antegrade +---------+--------+--+--------+--+---------+  Left Carotid Findings: +----------+--------+--------+--------+------------------+------------------+           PSV cm/sEDV cm/sStenosisPlaque DescriptionComments           +----------+--------+--------+--------+------------------+------------------+ CCA Prox  99      3                                                    +----------+--------+--------+--------+------------------+------------------+ CCA Distal79      16                                intimal thickening +----------+--------+--------+--------+------------------+------------------+ ICA Prox  72      18      1-39%                     intimal thickening +----------+--------+--------+--------+------------------+------------------+ ICA Distal96      25                                                   +----------+--------+--------+--------+------------------+------------------+ ECA       78      1                                                    +----------+--------+--------+--------+------------------+------------------+ +----------+--------+--------+--------+-------------------+           PSV cm/sEDV cm/sDescribeArm Pressure (mmHG) +----------+--------+--------+--------+-------------------+ Subclavian132                                          +----------+--------+--------+--------+-------------------+ +---------+--------+--+--------+--+---------+ VertebralPSV cm/s67EDV cm/s15Antegrade +---------+--------+--+--------+--+---------+   Summary: Right Carotid: Velocities in the right ICA are consistent with a 1-39% stenosis. Left Carotid: Velocities in the left ICA are consistent with a 1-39% stenosis. Vertebrals: Bilateral vertebral arteries demonstrate antegrade flow. *See table(s) above for measurements and observations.  Electronically signed by Antony Contras MD on 03/29/2020 at 5:05:55 PM.    Final     EKG: NSR at 75 bpm with 1st degree AV block and RBBB 152 ms  (personally reviewed)   Tele strip below associated with near syncope (9/20 17:40)     Strip below in [facility] ED and associated with near syncope. (9/19 13:27)   TELEMETRY: NSR 70-90s, + PVCS, episode of CHB noted last night with  near syncope (personally reviewed)  Assessment/Plan: 1.  Syncope -> symptomatic bradycardia CHB noted on tele in the setting of her symptoms.  Echo 03/29/2020 LVEF 60-65% CT head unremarkable. 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 Her last dose of Lopressor was Sunday am prior to 0700. She has had continued episodes with symptoms > 36 hours off of metoprolol and will likely require it in the future for her PAF and PVCs.  Explained risks, benefits, and alternatives to PPM implantation, including but not limited to bleeding, infection, pneumothorax, pericardial effusion, lead dislodgement, heart attack, stroke, or death.  Pt verbalized understanding and agrees to proceed if indicated.  She has been NPO  2. Hypokalemia K 3.4 -> 3.3 Supp ordered for 40 meq BID today  3. HTN Continue losartan HCT and amlodipine Can resume BB s/p PPM  4. PVCs Pt reports plans for ablation with Dr. Remus Blake in the near future. She will need close follow up there.   5. H/o lymphoma Reports that  she has received chest radiation.   Dr. Rayann Heman to see for further discussion.  For questions or updates, please contact [place] Please consult [email or web address] for contact info under Cardiology/STEMI.  Signed, Shirley Friar, PA-C  03/30/2020 8:21 AM  I have seen, examined the patient, and reviewed the above assessment and plan.  Changes to above are made where necessary.  On exam, RRR.  The patient has symptomatic transient complete heart block with syncope.  No reversible cause has been found.  I have spoken at length with Dr Remus Blake at [facility] who knows the patient well.  We agree that PPM is indicated at this time.  Risks, benefits, alternatives to pacemaker implantation were discussed in detail with the patient today. The patient understands that the risks include but are not limited to bleeding, infection, pneumothorax, perforation, tamponade, vascular damage, renal failure, MI, stroke, death,  and lead dislodgement and wishes to proceed.    Co Sign: Thompson Grayer, MD 03/30/2020 1:37 PM

## 2020-03-30 NOTE — Progress Notes (Signed)
Progress Note  Patient Name: [name] Date of Encounter: 03/30/2020  [facility] Cardiologist: [place]  Subjective   Pt denies CP or dyspnea; presyncopal episode last PM associated with CHB on telemetry.  Inpatient Medications    Scheduled Meds: . enoxaparin (LOVENOX) injection  40 mg Subcutaneous Daily  . gabapentin  100 mg Oral BID  . hydrochlorothiazide  12.5 mg Oral Daily  . hydroxychloroquine  200 mg Oral BID  . levothyroxine  112 mcg Oral Q0600  . losartan  100 mg Oral Daily  . pantoprazole  40 mg Oral Daily  . potassium chloride  40 mEq Oral BID  . pravastatin  80 mg Oral QHS  . sodium chloride flush  3 mL Intravenous Q12H   Continuous Infusions:  PRN Meds: acetaminophen **OR** acetaminophen, ipratropium, ondansetron **OR** ondansetron (ZOFRAN) IV   Vital Signs    Vitals:   03/29/20 1955 03/29/20 2356 03/30/20 0159 03/30/20 0520  BP: (!) 159/74 (!) 149/56  (!) 167/69  Pulse: 74 65  79  Resp: 18 20  20   Temp: 98 F (36.7 C) 98.1 F (36.7 C)  97.8 F (36.6 C)  TempSrc: Oral Oral  Oral  SpO2: 98% 96%  97%  Weight:   84.8 kg   Height:        Intake/Output Summary (Last 24 hours) at 03/30/2020 0758 Last data filed at 03/30/2020 0753 Gross per 24 hour  Intake 603 ml  Output 2250 ml  Net -1647 ml   Last 3 Weights 03/30/2020 03/29/2020 03/29/2020  Weight (lbs) 186 lb 14.4 oz 184 lb 3.2 oz 186 lb 3.2 oz  Weight (kg) 84.777 kg 83.553 kg 84.46 kg      Telemetry    Sinus bradycardia with episode of CHB- Personally Reviewed  Telemetry from emergency room reviewed previously and showed episode of complete heart block with 3-second pause.   Physical Exam   GEN: No acute distress.  WD/WN Neck: No JVD, supple Cardiac: RRR Respiratory: CTA GI: Soft, NT/ND MS: No edema Neuro: Grossly intact Psych: Normal affect   Labs    High Sensitivity Troponin:   Recent Labs  Lab 03/26/20 1450 03/26/20 1737 03/28/20 1325  03/28/20 1448  TROPONINIHS 10 10 10 9       Chemistry Recent Labs  Lab 03/26/20 1450 03/26/20 1450 03/28/20 1325 03/28/20 1325 03/28/20 1902 03/29/20 0422 03/30/20 0343  NA 136   < > 135  --   --  137 135  K 3.9   < > 3.6  --   --  3.4* 3.3*  CL 103   < > 97*  --   --  99 98  CO2 25   < > 28  --   --  29 31  GLUCOSE 106*   < > 107*  --   --  107* 107*  BUN 32*   < > 14  --   --  13 14  CREATININE 1.34*   < > 1.02*   < > 1.10* 1.37* 1.20*  CALCIUM 8.9   < > 8.9  --   --  9.2 9.0  PROT 6.6  --  6.9  --   --  6.1*  --   ALBUMIN 3.7  --  3.8  --   --  3.3*  --   AST 22  --  19  --   --  19  --   ALT 16  --  17  --   --  16  --   ALKPHOS 38  --  35*  --   --  36*  --   BILITOT 1.0  --  0.9  --   --  1.1  --   GFRNONAA 37*   < > 51*   < > 46* 36* 42*  GFRAA 42*   < > 59*   < > 54* 41* 48*  ANIONGAP 8   < > 10  --   --  9 6   < > = values in this interval not displayed.     Hematology Recent Labs  Lab 03/28/20 1325 03/28/20 1902 03/29/20 0422  WBC 6.7 6.0 5.5  RBC 4.32 4.27 4.21  HGB 12.5 12.3 12.3  HCT 37.8 36.9 37.0  MCV 87.5 86.4 87.9  MCH 28.9 28.8 29.2  MCHC 33.1 33.3 33.2  RDW 12.2 12.3 12.3  PLT 220 225 202    Radiology    CT Head Wo Contrast  Result Date: 03/28/2020 CLINICAL DATA:  Minor head trauma. Multiple recent syncopal episodes. EXAM: CT HEAD WITHOUT CONTRAST TECHNIQUE: Contiguous axial images were obtained from the base of the skull through the vertex without intravenous contrast. COMPARISON:  March 26, 2020 FINDINGS: Brain: No evidence of acute infarction, hemorrhage, hydrocephalus, extra-axial collection or mass lesion/mass effect. Vascular: No hyperdense vessel or unexpected calcification. Skull: Normal. Negative for fracture or focal lesion. Sinuses/Orbits: Scattered opacification in the frontal and ethmoid sinuses. There is a small amount of fluid in maxillary sinuses. Previous sinus surgery is identified. Mastoid air cells and middle ears are  well aerated. Other: None. IMPRESSION: 1. Sinus disease as above. 2. No acute intracranial abnormalities. Electronically Signed   By: Dorise Bullion III M.D   On: 03/28/2020 14:43   DG Chest Portable 1 View  Result Date: 03/28/2020 CLINICAL DATA:  Syncope. EXAM: PORTABLE CHEST 1 VIEW COMPARISON:  March 26, 2020 FINDINGS: A transcutaneous pacer partially overlies and obscures the left medial upper chest. No pneumothorax. The heart, hila, mediastinum are unchanged. The lungs remain clear. Minimal atelectasis in the left base. IMPRESSION: No active disease. Electronically Signed   By: Dorise Bullion III M.D   On: 03/28/2020 14:44   ECHOCARDIOGRAM COMPLETE  Result Date: 03/29/2020    ECHOCARDIOGRAM REPORT   Patient Name:   [name] Date of Exam: 03/29/2020 Medical Rec #:  [id number]     Height:       65.0 in Accession #:    [id number]    Weight:       184.2 lb Date of Birth:  01-14-1935     BSA:          1.910 m Patient Age:    85 years      BP:           159/98 mmHg Patient Gender: F             HR:           63 bpm. Exam Location:  Inpatient Procedure: 2D Echo, 3D Echo, Cardiac Doppler, Color Doppler and Strain Analysis Indications:    R55 Syncope  History:        Patient has no prior history of Echocardiogram examinations.                 Abnormal ECG, Arrythmias:Atrial Fibrillation,                 Signs/Symptoms:Dyspnea and Syncope; Risk Factors:Hypertension.  Lymphoma.  Sonographer:    Roseanna Rainbow RDCS Referring Phys: [id number] [place]  Sonographer Comments: Technically difficult study due to poor echo windows. Image acquisition challenging due to respiratory motion and Image acquisition challenging due to patient body habitus. Global longitudinal strain was attempted. IMPRESSIONS  1. Left ventricular ejection fraction, by estimation, is 60 to 65%. The left ventricle has normal function. The left ventricle has no regional wall motion abnormalities. There is mild asymmetric left  ventricular hypertrophy of the septal segment. Left ventricular diastolic parameters are consistent with Grade I diastolic dysfunction (impaired relaxation). Elevated left ventricular end-diastolic pressure. The average left ventricular global longitudinal strain is -18.1 %. The global longitudinal strain  is normal.  2. Right ventricular systolic function is normal. The right ventricular size is normal.  3. The mitral valve is normal in structure. Mild mitral valve regurgitation. No evidence of mitral stenosis.  4. The aortic valve was not well visualized. There is mild calcification of the aortic valve. There is moderate thickening of the aortic valve. Aortic valve regurgitation is moderate. Mild to moderate aortic valve sclerosis/calcification is present, without any evidence of aortic stenosis. Aortic regurgitation PHT measures 435 msec.  5. Aortic dilatation noted. There is mild dilatation of the ascending aorta, measuring 38 mm.  6. The inferior vena cava is normal in size with greater than 50% respiratory variability, suggesting right atrial pressure of 3 mmHg. FINDINGS  Left Ventricle: Left ventricular ejection fraction, by estimation, is 60 to 65%. The left ventricle has normal function. The left ventricle has no regional wall motion abnormalities. The average left ventricular global longitudinal strain is -18.1 %. The global longitudinal strain is normal. The left ventricular internal cavity size was normal in size. There is mild asymmetric left ventricular hypertrophy of the septal segment. Left ventricular diastolic parameters are consistent with Grade I diastolic dysfunction (impaired relaxation). Elevated left ventricular end-diastolic pressure. Right Ventricle: The right ventricular size is normal. No increase in right ventricular wall thickness. Right ventricular systolic function is normal. Left Atrium: Left atrial size was normal in size. Right Atrium: Right atrial size was normal in size.  Pericardium: There is no evidence of pericardial effusion. Mitral Valve: The mitral valve is normal in structure. Mild mitral valve regurgitation. No evidence of mitral valve stenosis. Tricuspid Valve: The tricuspid valve is normal in structure. Tricuspid valve regurgitation is not demonstrated. No evidence of tricuspid stenosis. Aortic Valve: The aortic valve was not well visualized. There is mild calcification of the aortic valve. There is moderate thickening of the aortic valve. There is moderate aortic valve annular calcification. Aortic valve regurgitation is moderate. Aortic regurgitation PHT measures 435 msec. Mild to moderate aortic valve sclerosis/calcification is present, without any evidence of aortic stenosis. Pulmonic Valve: The pulmonic valve was normal in structure. Pulmonic valve regurgitation is not visualized. No evidence of pulmonic stenosis. Aorta: Aortic dilatation noted. There is mild dilatation of the ascending aorta, measuring 38 mm. Venous: The inferior vena cava is normal in size with greater than 50% respiratory variability, suggesting right atrial pressure of 3 mmHg. IAS/Shunts: No atrial level shunt detected by color flow Doppler.  LEFT VENTRICLE PLAX 2D LVIDd:         5.40 cm     Diastology LVIDs:         3.70 cm     LV e' medial:    3.68 cm/s LV PW:         1.00 cm     LV E/e'  medial:  18.9 LV IVS:        1.20 cm     LV e' lateral:   8.76 cm/s LVOT diam:     2.10 cm     LV E/e' lateral: 7.9 LV SV:         99 LV SV Index:   52          2D Longitudinal Strain LVOT Area:     3.46 cm    2D Strain GLS (A2C):   -14.4 %                            2D Strain GLS (A3C):   -20.2 %                            2D Strain GLS (A4C):   -19.6 % LV Volumes (MOD)           2D Strain GLS Avg:     -18.1 % LV vol d, MOD A2C: 86.8 ml LV vol d, MOD A4C: 85.6 ml LV vol s, MOD A2C: 44.1 ml LV vol s, MOD A4C: 25.1 ml LV SV MOD A2C:     42.7 ml LV SV MOD A4C:     85.6 ml LV SV MOD BP:      55.9 ml RIGHT VENTRICLE             IVC RV S prime:     9.99 cm/s  IVC diam: 2.50 cm TAPSE (M-mode): 2.6 cm LEFT ATRIUM             Index       RIGHT ATRIUM           Index LA diam:        3.80 cm 1.99 cm/m  RA Area:     18.70 cm LA Vol (A2C):   40.3 ml 21.10 ml/m RA Volume:   50.50 ml  26.44 ml/m LA Vol (A4C):   36.4 ml 19.06 ml/m LA Biplane Vol: 39.1 ml 20.47 ml/m  AORTIC VALVE LVOT Vmax:   127.00 cm/s LVOT Vmean:  78.800 cm/s LVOT VTI:    0.287 m AI PHT:      435 msec  AORTA Ao Root diam: 3.10 cm Ao Asc diam:  3.80 cm MITRAL VALVE MV Area (PHT): 2.54 cm    SHUNTS MV Decel Time: 299 msec    Systemic VTI:  0.29 m MV E velocity: 69.40 cm/s  Systemic Diam: 2.10 cm MV A velocity: 92.50 cm/s MV E/A ratio:  0.75 Fransico Him MD Electronically signed by Fransico Him MD Signature Date/Time: 03/29/2020/10:30:56 AM    Final    VAS US CAROTID  Result Date: 03/29/2020 Carotid Arterial Duplex Study Indications:  Syncope and Weakness. Risk Factors: Hypertension, hyperlipidemia. Performing Technologist: Griffin Basil RCT RDMS  Examination Guidelines: A complete evaluation includes B-mode imaging, spectral Doppler, color Doppler, and power Doppler as needed of all accessible portions of each vessel. Bilateral testing is considered an integral part of a complete examination. Limited examinations for reoccurring indications may be performed as noted.  Right Carotid Findings: +----------+--------+--------+--------+------------------+------------------+           PSV cm/sEDV cm/sStenosisPlaque DescriptionComments           +----------+--------+--------+--------+------------------+------------------+ CCA Prox  77      13                                                   +----------+--------+--------+--------+------------------+------------------+  CCA Distal71      10                                intimal thickening +----------+--------+--------+--------+------------------+------------------+ ICA Prox  86      15       1-39%                     intimal thickening +----------+--------+--------+--------+------------------+------------------+ ICA Distal61      15                                                   +----------+--------+--------+--------+------------------+------------------+ ECA       75      1                                                    +----------+--------+--------+--------+------------------+------------------+ +----------+--------+-------+--------+-------------------+           PSV cm/sEDV cmsDescribeArm Pressure (mmHG) +----------+--------+-------+--------+-------------------+ [id number]     1                                  +----------+--------+-------+--------+-------------------+ +---------+--------+--+--------+--+---------+ VertebralPSV cm/s43EDV cm/s12Antegrade +---------+--------+--+--------+--+---------+  Left Carotid Findings: +----------+--------+--------+--------+------------------+------------------+           PSV cm/sEDV cm/sStenosisPlaque DescriptionComments           +----------+--------+--------+--------+------------------+------------------+ CCA Prox  99      3                                                    +----------+--------+--------+--------+------------------+------------------+ CCA Distal79      16                                intimal thickening +----------+--------+--------+--------+------------------+------------------+ ICA Prox  72      18      1-39%                     intimal thickening +----------+--------+--------+--------+------------------+------------------+ ICA Distal96      25                                                   +----------+--------+--------+--------+------------------+------------------+ ECA       78      1                                                    +----------+--------+--------+--------+------------------+------------------+  +----------+--------+--------+--------+-------------------+           PSV cm/sEDV cm/sDescribeArm Pressure (mmHG) +----------+--------+--------+--------+-------------------+ Subclavian132                                         +----------+--------+--------+--------+-------------------+ +---------+--------+--+--------+--+---------+  VertebralPSV cm/s67EDV cm/s15Antegrade +---------+--------+--+--------+--+---------+   Summary: Right Carotid: Velocities in the right ICA are consistent with a 1-39% stenosis. Left Carotid: Velocities in the left ICA are consistent with a 1-39% stenosis. Vertebrals: Bilateral vertebral arteries demonstrate antegrade flow. *See table(s) above for measurements and observations.  Electronically signed by Antony Contras MD on 03/29/2020 at 5:05:55 PM.    Final     Patient Profile     85 y.o. female with past medical history hypertension and PVCs admitted with syncope. In the [facility] emergency room she apparently had an episode and was noted to have a sinus pause with extreme bradycardia by report ("HR 12").  Beta-blocker has been discontinued.  Echocardiogram shows normal LV function, mild left ventricular hypertrophy, grade 1 diastolic dysfunction, moderate aortic insufficiency and mild mitral regurgitation.  Assessment & Plan    1 syncope-bradycardia mediated. Patient has not had metoprolol since Sunday morning. She had another presyncopal episode last evening associated with complete heart block and 3.6-second pause. Echocardiogram shows normal LV function. I discussed patient with Dr. Curt Bears. Given that she has had another episode despite being off of metoprolol for 36 hours he feels that she will likely need pacemaker. Electrophysiology will see.  Will keep n.p.o. for now.  2 hypertension-blood pressure elevated. Continue losartan HCT. Add amlodipine 5 mg daily. Follow blood pressure and adjust regimen as needed.  3 PVCs-patient apparently was scheduled  for ablation in the near future. She will need follow-up at [facility] following discharge.  4 hypokalemia-supplement.  For questions or updates, please contact [place] Please consult [email or web address] for contact info under        Signed, Kirk Ruths, MD  03/30/2020, 7:58 AM

## 2020-03-30 NOTE — Progress Notes (Signed)
OT Cancellation Note  Patient Details Name: [name] MRN: [id number] DOB: 1935-06-19   Cancelled Treatment:    Reason Eval/Treat Not Completed: Patient at procedure or test/ unavailable.  Continue efforts as appropriate.  Jaxtin Raimondo D Stephon Weathers 03/30/2020, 1:36 PM  03/30/2020  Denice Paradise, OTR/L  Acute Rehabilitation Services  Office:  [phone]

## 2020-03-30 NOTE — Interval H&P Note (Signed)
History and Physical Interval Note:  03/30/2020 1:39 PM  [name]  has presented today for surgery, with the diagnosis of Heart block.  The various methods of treatment have been discussed with the patient and family. After consideration of risks, benefits and other options for treatment, the patient has consented to  Procedure(s): PACEMAKER IMPLANT (N/A) as a surgical intervention.  The patient's history has been reviewed, patient examined, no change in status, stable for surgery.  I have reviewed the patient's chart and labs.  Questions were answered to the patient's satisfaction.     Thompson Grayer

## 2020-03-30 NOTE — Progress Notes (Signed)
PROGRESS NOTE  [name]:[id number] DOB: 1935/07/10 DOA: 03/28/2020 PCP: [name], DO  Brief History   85 year old woman PMH paroxysmal atrial fibrillation presenting with recurrent syncope.  Had a syncopal episode in the emergency department telemetry was consistent with transient complete heart block. Admitted for SSS.  Complete heart block intermittently despite being off beta-blocker.  Seen by cardiology and electrophysiology, status post pacemaker placement 9/21.  Anticipate discharge home 9/22.   A & P  Syncope --cardiogenic; episode of CHB in ED and several on floor --was on BB, stopped on admit but still having epsidoes --echo LVEF 25-36%, grad I diastolic dysfunction, normal RN, moderate AV regurg --EKG SR RBBB (old) --carotid u/s w/o sig stenosis --EP > PPM today  Benign essential hypertension --stable; continue HCTZ and losartan  Hypothyroidism --TSH WNL --continue levothyroxine   Disposition Plan:  Discussion: recurrent transient CHB w/ plan for PPM today. Likely home 9/22  Status is: Inpatient  Remains inpatient appropriate because:Inpatient level of care appropriate due to severity of illness  Dispo: The patient is from: Home              Anticipated d/c is to: Home              Anticipated d/c date is: 1 day              Patient currently is not medically stable to d/c.  DVT prophylaxis: SCDs Start: 03/28/20 1841   Code Status: Full Code Family Communication:   Murray Hodgkins, MD  [facility] Direct contact: see [email or web address] (further directions at bottom of note if needed) 7PM-7AM contact night coverage as at bottom of note 03/30/2020, 5:25 PM  LOS: 2 days   Significant Hospital Events   .    Consults:  . Cardiology . EP   Procedures:  .    Interval History/Subjective  CC: f/u syncope  Another presyncopal episode but otherwise ok, no pain, breathing fine.  Objective   Vitals:  Vitals:   03/30/20 1223 03/30/20 1649   BP: (!) 179/80   Pulse: 68   Resp: 18   Temp: 98.1 F (36.7 C)   SpO2: 97% 98%    Exam: Constitutional:   . Appears calm and comfortable ENMT:  . grossly normal hearing  Respiratory:  . CTA bilaterally, no w/r/r.  . Respiratory effort normal.  Cardiovascular:  . RRR, no m/r/g . Telemetry SR. 1 episode Mobitz II . No LE extremity edema   Psychiatric:  . Mental status o Mood, affect appropriate   I have personally reviewed the following:   Today's Data  . CBG stable . K+ 3.3 . Creatinine down to 1.20  Scheduled Meds: . [MAR Hold] amLODipine  5 mg Oral Daily  . [MAR Hold] gabapentin  100 mg Oral BID  . gentamicin irrigation  80 mg Irrigation On Call  . [MAR Hold] hydrochlorothiazide  12.5 mg Oral Daily  . [MAR Hold] hydroxychloroquine  200 mg Oral BID  . [MAR Hold] levothyroxine  112 mcg Oral Q0600  . [MAR Hold] losartan  100 mg Oral Daily  . [MAR Hold] pantoprazole  40 mg Oral Daily  . [MAR Hold] potassium chloride  40 mEq Oral BID  . [MAR Hold] pravastatin  80 mg Oral QHS  . [MAR Hold] sodium chloride flush  3 mL Intravenous Q12H   Continuous Infusions: . sodium chloride    . sodium chloride    . ceFAZolin    .  ceFAZolin (ANCEF) IV 2  g (03/30/20 1658)    Principal Problem:   Syncope Active Problems:   Benign essential hypertension   Hypothyroidism   Class 1 obesity due to excess calories with serious comorbidity and body mass index (BMI) of 30.0 to 23.3 in adult   Follicular lymphoma (HCC)   GERD (gastroesophageal reflux disease)   Other hyperlipidemia   LOS: 2 days   How to contact the [facility] Attending or Consulting provider 7A - 7P or covering provider during after hours [place], for this patient?  1. Check the care team in [facility] and look for a) attending/consulting TR[facility] provider listed and b) the [facility] team listed 2. Log into [email or web address] and use [place]'s universal password to access. If you do not have the password, please contact the hospital  operator. 3. Locate the [facility] provider you are looking for under [facility] and page to a number that you can be directly reached. 4. If you still have difficulty reaching the provider, please page the [facility] (Director on Call) for the Hospitalists listed on amion for assistance.

## 2020-03-30 NOTE — Progress Notes (Signed)
Physical Therapy Treatment Patient Details Name: [name] MRN: [id number] DOB: 1934-08-24 Today's Date: 03/30/2020    History of Present Illness 85 yr old with PAF transferred for dizziness and syncopal episodes.  Patient has history of A. fib, Mnire's, hypertension who presents to the ED after multiple syncopal episodes.  She states that she has been having episodes of syncope since last Friday.  Episodes last about 20 to 30 seconds with loss of consciousness and comes back quickly.  She feels weak following this.  Her daughter as visit witnessed these episodes and states that there was no seizures or any postictal state.  She had an episode while in the ER at [facility] where it was noted that she had sinus pause with extreme bradycardia. (plan for pacemaker placement 9/21)    PT Comments    Pt able to progress to OOB activities; pt limited during session due to episode requiring pt to return supine in bed; pacemaker placement scheduled for 9/21 in PM; pt able to perform basic functional mobility without assist, close S needed for higher level balance possibly due to oncoming episode; pt will benefit from further skilled PT intervention to address balance, gait and endurance to maximize independence with functional mobility prior to discharge.    Follow Up Recommendations  No PT follow up     Equipment Recommendations  None recommended by PT    Recommendations for Other Services       Precautions / Restrictions Precautions Precautions: Fall Restrictions Weight Bearing Restrictions: No Other Position/Activity Restrictions: monitor BP and HR    Mobility  Bed Mobility Overal bed mobility: Independent Bed Mobility: Supine to Sit;Sit to Supine     Supine to sit: Independent Sit to supine: Independent   General bed mobility comments: performed supine<>sit I  Transfers                 General transfer comment: pt performed sit<>stand initially I; pt requiring close  S when performing stand>supine due to episode  Ambulation/Gait             General Gait Details: unable to assess due to eposide with pt returning supine in bed   Stairs             Wheelchair Mobility    Modified Rankin (Stroke Patients Only)       Balance                                            Cognition Arousal/Alertness: Awake/alert Behavior During Therapy: WFL for tasks assessed/performed Overall Cognitive Status: Within Functional Limits for tasks assessed                                 General Comments: pt with episode at 11:20; RN and MD notified and pt returned supine in bed      Exercises      General Comments General comments (skin integrity, edema, etc.): static and dynamic sitting I; pt requiring close S during standing marching, no physical assistance needed; episode occurred following marching activity      Pertinent Vitals/Pain Pain Assessment: No/denies pain    Home Living                      Prior Function  PT Goals (current goals can now be found in the care plan section) Acute Rehab PT Goals Patient Stated Goal: to go home PT Goal Formulation: With patient/family Time For Goal Achievement: 04/12/20 Potential to Achieve Goals: Good Progress towards PT goals: Progressing toward goals    Frequency    Min 3X/week      PT Plan Current plan remains appropriate    Co-evaluation              AM-PAC PT "6 Clicks" Mobility   Outcome Measure  Help needed turning from your back to your side while in a flat bed without using bedrails?: None Help needed moving from lying on your back to sitting on the side of a flat bed without using bedrails?: None Help needed moving to and from a bed to a chair (including a wheelchair)?: None Help needed standing up from a chair using your arms (e.g., wheelchair or bedside chair)?: None Help needed to walk in hospital room?:  None Help needed climbing 3-5 steps with a railing? : A Little 6 Click Score: 23    End of Session Equipment Utilized During Treatment: Gait belt Activity Tolerance: Treatment limited secondary to medical complications (Comment) Patient left: in bed;with call bell/phone within reach;with family/visitor present Nurse Communication: Mobility status;Other (comment) (RN alerted of eposide with pt returned supine in bed) PT Visit Diagnosis: Unsteadiness on feet (R26.81)     Time: 2162-4469 PT Time Calculation (min) (ACUTE ONLY): 13 min  Charges:  $Therapeutic Activity: 8-22 mins                     Lyanne Co, DPT Acute Rehabilitation Services [phone]   [place] 03/30/2020, 12:24 PM

## 2020-03-30 NOTE — Consult Note (Addendum)
ELECTROPHYSIOLOGY CONSULT NOTE    Patient ID: [name] MRN: [id number], DOB/AGE: Nov 13, 1934 85 y.o.  Admit date: 03/28/2020 Date of Consult: 03/30/2020  Primary Physician: [name], DO Primary Cardiologist: Governor Specking [facility])  Electrophysiologist: New  Referring Provider: Dr. Stanford Breed  Patient Profile: [name] is a 85 y.o. female with a history of PAF, HTN, and Menieres disease who is being seen today for the evaluation of symptomatic bradycardia at the request of Dr. Stanford Breed.  HPI:  MARGEAUX Greer is a 85 y.o. female with medical history above. Pt has been followed by Dr. Remus Blake on Metoprolol for PAF (previously on flecainide.    Pt reports episodes of syncope since Friday, with LOC of about 20-30 seconds but she "comes back quickly" and feels weak for a short period following this.  Her daughter has witnessed multiple of these episodes and denies seizure like activity or postictal state.  Pt reportedly had an episode in the ER at [facility]. Reports state she had a sinus pause with extreme bradycardia as low as "12 bpm". Mild 3+ second pauses noted on tele here. She was transferred to [facility] for further evaluation.   She states she has been having symptoms for approximately six weeks, culminating in syncope this previous Friday. She was seen in ED and cleared. Had a non-eventful day Saturday, with only one episode of near syncope.  Sunday am she had 5 episodes first thing in the morning and was taken to [place]. Transient CHB noted on tele and transferred to [facility] as above.  She is feeling OK this am. Fatigued. She is having increasing palpitations, and has noted her PVCs have increased as she has washed out from metoprolol. She denies chest pain. She has PVC ablation planned with Dr. Remus Blake, not currently scheduled. She had a stress test planned for tomorrow to see if this would incite further PVCs for better mapping, per pt and daughter.   Past Medical  History:  Diagnosis Date  . Arthritis   . Asthma   . Atrial fibrillation (HCC)    paroxysmal , addressed  with metoprolol   . Chronic sinusitis   . Complication of anesthesia   . Dyspnea    sob on exertion, reports at her pre-op today 09-13-2018 that her cardiologist has planned for her to do a ECHO for evaluation   . Family history of adverse reaction to anesthesia    sister is hard to wake up from Anesthesia  . Follicular lymphoma ([place])    received Chemotherapy (2 years ago)  and radiation (last 12/2017)  . Hyperlipidemia   . Hypertension   . Hypothyroidism   . Meniere's disease   . PONV (postoperative nausea and vomiting)   . Primary localized osteoarthritis of left knee 09/24/2018  . Primary localized osteoarthritis of right knee 06/18/2018  . Reflux      Surgical History:  Past Surgical History:  Procedure Laterality Date  . ABDOMINAL HYSTERECTOMY    . BONE MARROW BIOPSY  06/16/2014   at [place]     bilateral  . CHOLECYSTECTOMY    . KNEE ARTHROSCOPY    . PARTIAL KNEE ARTHROPLASTY Right 06/18/2018   Procedure: UNICOMPARTMENTAL KNEE;  Surgeon: Marchia Bond, MD;  Location: [facility] ORS;  Service: Orthopedics;  Laterality: Right;  . PARTIAL KNEE ARTHROPLASTY Left 09/24/2018   Procedure: UNICOMPARTMENTAL KNEE;  Surgeon: Marchia Bond, MD;  Location: [facility] ORS;  Service: Orthopedics;  Laterality: Left;  Medications Prior to Admission  Medication Sig Dispense Refill Last Dose  . acyclovir ointment (ZOVIRAX) 5 % Apply 1 application topically daily as needed (for cold sores). 15 g 3 UNK  . amLODipine (NORVASC) 5 MG tablet Take 1 tablet (5 mg total) by mouth daily. 90 tablet 1 03/28/2020 at Unknown time  . aspirin EC 81 MG tablet Take 81 mg by mouth daily.   03/28/2020 at Unknown time  . baclofen (LIORESAL) 10 MG tablet Take 1 tablet (10 mg total) by mouth 3 (three) times daily as needed (muscle spasms). 30 tablet 0 Past Week at Unknown time  . cephALEXin  (KEFLEX) 500 MG capsule Take 1 capsule (500 mg total) by mouth 2 (two) times daily for 7 days. 14 capsule 0 03/28/2020 at Unknown time  . fluticasone (FLOVENT HFA) 110 MCG/ACT inhaler Inhale 2 puffs into the lungs 2 (two) times daily as needed (for shortness of breath or wheezing). 3 Inhaler 3 Past Week at Unknown time  . gabapentin (NEURONTIN) 100 MG capsule Take 100 mg by mouth 2 (two) times daily.    03/28/2020 at Unknown time  . hydrochlorothiazide (HYDRODIURIL) 25 MG tablet Take 1 tablet (25 mg total) by mouth daily. 90 tablet 1 03/28/2020 at Unknown time  . hydroxychloroquine (PLAQUENIL) 200 MG tablet Take 200 mg by mouth 2 (two) times daily.    03/28/2020 at Unknown time  . ipratropium (ATROVENT) 0.03 % nasal spray Place 2 sprays into both nostrils daily as needed for rhinitis (severe nasal drip).    Past Month at Unknown time  . isosorbide mononitrate (IMDUR) 30 MG 24 hr tablet Take 1 tablet (30 mg total) by mouth daily. 90 tablet 3 03/28/2020 at Unknown time  . levothyroxine (SYNTHROID) 112 MCG tablet TAKE 1 TABLET BY MOUTH  DAILY BEFORE BREAKFAST (Patient taking differently: Take 112 mcg by mouth daily before breakfast. ) 90 tablet 3 03/28/2020 at Unknown time  . losartan (COZAAR) 100 MG tablet Take 1 tablet (100 mg total) by mouth at bedtime. 90 tablet 3 03/27/2020  . meclizine (ANTIVERT) 25 MG tablet Take 1 tablet (25 mg total) by mouth 3 (three) times daily as needed for dizziness. 30 tablet 0 Past Month at Unknown time  . meloxicam (MOBIC) 15 MG tablet Take 15 mg by mouth daily as needed for pain.   UNK  . metoprolol tartrate (LOPRESSOR) 50 MG tablet Take 75 mg by mouth daily.   03/28/2020 at 0800  . omeprazole (PRILOSEC) 40 MG capsule TAKE 1 CAPSULE BY MOUTH IN  THE MORNING AND MAY REPEAT  DOSE IN THE EVENING AS  NEEDED FOR ACID REFLUX /  INDIGESTION (Patient taking differently: Take 40 mg by mouth See admin instructions. Take 18m by mouth once daily, may take an additional 456min the evening as  needed for acid reflux/indigestion.) 180 capsule 3 03/28/2020 at Unknown time  . ondansetron (ZOFRAN) 4 MG tablet Take 1 tablet (4 mg total) by mouth every 8 (eight) hours as needed for nausea or vomiting. 10 tablet 0 Past Week at Unknown time  . pravastatin (PRAVACHOL) 80 MG tablet TAKE 1 TABLET BY MOUTH AT  BEDTIME (Patient taking differently: Take 80 mg by mouth at bedtime. ) 90 tablet 3 03/27/2020  . sennosides-docusate sodium (SENOKOT-S) 8.6-50 MG tablet Take 2 tablets by mouth daily. (Patient taking differently: Take 2 tablets by mouth daily as needed (constipation.). ) 30 tablet 1 UNK  . tetrahydrozoline (VISINE) 0.05 % ophthalmic solution Place 2 drops into both eyes daily  as needed (for dry eyes).   Past Month at Unknown time  . triamcinolone (NASACORT ALLERGY 24HR) 55 MCG/ACT AERO nasal inhaler Place 2 sprays into the nose daily. (Patient taking differently: Place 2 sprays into the nose daily as needed (allergies.). ) 3 Inhaler 3 03/28/2020 at Unknown time    Inpatient Medications:  . amLODipine  5 mg Oral Daily  . enoxaparin (LOVENOX) injection  40 mg Subcutaneous Daily  . gabapentin  100 mg Oral BID  . hydrochlorothiazide  12.5 mg Oral Daily  . hydroxychloroquine  200 mg Oral BID  . levothyroxine  112 mcg Oral Q0600  . losartan  100 mg Oral Daily  . pantoprazole  40 mg Oral Daily  . potassium chloride  40 mEq Oral BID  . pravastatin  80 mg Oral QHS  . sodium chloride flush  3 mL Intravenous Q12H    Allergies: No Known Allergies  Social History   Socioeconomic History  . Marital status: Widowed    Spouse name: Not on file  . Number of children: 1  . Years of education: 8  . Highest education level: 12th grade  Occupational History  . Occupation: Theatre manager    Comment: retired  Tobacco Use  . Smoking status: Never Smoker  . Smokeless tobacco: Never Used  Vaping Use  . Vaping Use: Never used  Substance and Sexual Activity  . Alcohol use: No  . Drug use: No  .  Sexual activity: Not Currently    Partners: Male  Other Topics Concern  . Not on file  Social History Narrative  . Not on file   Social Determinants of Health   Financial Resource Strain:   . Difficulty of Paying Living Expenses: Not on file  Food Insecurity:   . Worried About Charity fundraiser in the Last Year: Not on file  . Ran Out of Food in the Last Year: Not on file  Transportation Needs:   . Lack of Transportation (Medical): Not on file  . Lack of Transportation (Non-Medical): Not on file  Physical Activity:   . Days of Exercise per Week: Not on file  . Minutes of Exercise per Session: Not on file  Stress:   . Feeling of Stress : Not on file  Social Connections:   . Frequency of Communication with Friends and Family: Not on file  . Frequency of Social Gatherings with Friends and Family: Not on file  . Attends Religious Services: Not on file  . Active Member of Clubs or Organizations: Not on file  . Attends Archivist Meetings: Not on file  . Marital Status: Not on file  Intimate Partner Violence:   . Fear of Current or Ex-Partner: Not on file  . Emotionally Abused: Not on file  . Physically Abused: Not on file  . Sexually Abused: Not on file     Family History  Problem Relation Age of Onset  . Heart disease Other   . Skin cancer Mother   . Stroke Mother   . Prostate cancer Father   . High blood pressure Father   . Heart attack Father      Review of Systems: All other systems reviewed and are otherwise negative except as noted above.  Physical Exam: Vitals:   03/29/20 1955 03/29/20 2356 03/30/20 0159 03/30/20 0520  BP: (!) 159/74 (!) 149/56  (!) 167/69  Pulse: 74 65  79  Resp: '18 20  20  ' Temp: 98 F (36.7 C) 98.1 F (  36.7 C)  97.8 F (36.6 C)  TempSrc: Oral Oral  Oral  SpO2: 98% 96%  97%  Weight:   84.8 kg   Height:        GEN- The patient is well appearing, alert and oriented x 3 today.   HEENT: normocephalic, atraumatic; sclera  clear, conjunctiva pink; hearing intact; oropharynx clear; neck supple Lungs- Clear to ausculation bilaterally, normal work of breathing.  No wheezes, rales, rhonchi Heart- Regular rate and rhythm, no murmurs, rubs or gallops GI- soft, non-tender, non-distended, bowel sounds present Extremities- no clubbing, cyanosis, or edema; DP/PT/radial pulses 2+ bilaterally MS- no significant deformity or atrophy Skin- warm and dry, no rash or lesion Psych- euthymic mood, full affect Neuro- strength and sensation are intact  Labs:   Lab Results  Component Value Date   WBC 5.5 03/29/2020   HGB 12.3 03/29/2020   HCT 37.0 03/29/2020   MCV 87.9 03/29/2020   PLT 202 03/29/2020    Recent Labs  Lab 03/29/20 0422 03/29/20 0422 03/30/20 0343  NA 137   < > 135  K 3.4*   < > 3.3*  CL 99   < > 98  CO2 29   < > 31  BUN 13   < > 14  CREATININE 1.37*   < > 1.20*  CALCIUM 9.2   < > 9.0  PROT 6.1*  --   --   BILITOT 1.1  --   --   ALKPHOS 36*  --   --   ALT 16  --   --   AST 19  --   --   GLUCOSE 107*   < > 107*   < > = values in this interval not displayed.      Radiology/Studies: CT Head Wo Contrast  Result Date: 03/28/2020 CLINICAL DATA:  Minor head trauma. Multiple recent syncopal episodes. EXAM: CT HEAD WITHOUT CONTRAST TECHNIQUE: Contiguous axial images were obtained from the base of the skull through the vertex without intravenous contrast. COMPARISON:  March 26, 2020 FINDINGS: Brain: No evidence of acute infarction, hemorrhage, hydrocephalus, extra-axial collection or mass lesion/mass effect. Vascular: No hyperdense vessel or unexpected calcification. Skull: Normal. Negative for fracture or focal lesion. Sinuses/Orbits: Scattered opacification in the frontal and ethmoid sinuses. There is a small amount of fluid in maxillary sinuses. Previous sinus surgery is identified. Mastoid air cells and middle ears are well aerated. Other: None. IMPRESSION: 1. Sinus disease as above. 2. No acute  intracranial abnormalities. Electronically Signed   By: Dorise Bullion III M.D   On: 03/28/2020 14:43   CT Head Wo Contrast  Result Date: 03/26/2020 CLINICAL DATA:  Syncope at the EXAM: CT HEAD WITHOUT CONTRAST TECHNIQUE: Contiguous axial images were obtained from the base of the skull through the vertex without intravenous contrast. COMPARISON:  None. FINDINGS: Brain: No evidence of acute territorial infarction, hemorrhage, hydrocephalus,extra-axial collection or mass lesion/mass effect. The ventricles are normal in size and contour. There is mild low-attenuation changes in the deep white matter consistent with small vessel ischemia. Vascular: No hyperdense vessel or unexpected calcification. Skull: The skull is intact. No fracture or focal lesion identified. Sinuses/Orbits: Mucosal thickening seen within the ethmoid air cells and maxillary sinuses. The the the orbits and globes intact. Other: None IMPRESSION: No acute intracranial abnormality. Electronically Signed   By: Prudencio Pair M.D.   On: 03/26/2020 15:19   DG Chest Portable 1 View  Result Date: 03/28/2020 CLINICAL DATA:  Syncope. EXAM: PORTABLE CHEST 1 VIEW COMPARISON:  March 26, 2020 FINDINGS: A transcutaneous pacer partially overlies and obscures the left medial upper chest. No pneumothorax. The heart, hila, mediastinum are unchanged. The lungs remain clear. Minimal atelectasis in the left base. IMPRESSION: No active disease. Electronically Signed   By: Dorise Bullion III M.D   On: 03/28/2020 14:44   DG Chest Portable 1 View  Result Date: 03/26/2020 CLINICAL DATA:  Weakness EXAM: PORTABLE CHEST 1 VIEW COMPARISON:  Radiograph 03/03/2019 FINDINGS: Some coarsened interstitial and bronchitic features are similar to priors. Some hazy opacity towards the lung bases likely reflecting some atelectatic change and overlying tissue. Mild central vascular congestion without features of frank edema. The aorta is calcified. The remaining  cardiomediastinal contours are unremarkable. No acute osseous or soft tissue abnormality. Degenerative changes are present in the imaged spine and shoulders. Telemetry leads overlie the chest. IMPRESSION: 1. Basilar atelectasis. 2. Chronic bronchitic features. 3. Mild central vascular congestion without features of frank edema. 4.  Aortic Atherosclerosis (ICD10-I70.0). Electronically Signed   By: Lovena Le M.D.   On: 03/26/2020 16:21   ECHOCARDIOGRAM COMPLETE  Result Date: 03/29/2020    ECHOCARDIOGRAM REPORT   Patient Name:   NEKEYA BRISKI Date of Exam: 03/29/2020 Medical Rec #:  [id number]     Height:       65.0 in Accession #:    [id number]    Weight:       184.2 lb Date of Birth:  01-07-1935     BSA:          1.910 m Patient Age:    58 years      BP:           159/98 mmHg Patient Gender: F             HR:           63 bpm. Exam Location:  Inpatient Procedure: 2D Echo, 3D Echo, Cardiac Doppler, Color Doppler and Strain Analysis Indications:    R55 Syncope  History:        Patient has no prior history of Echocardiogram examinations.                 Abnormal ECG, Arrythmias:Atrial Fibrillation,                 Signs/Symptoms:Dyspnea and Syncope; Risk Factors:Hypertension.                 Lymphoma.  Sonographer:    Roseanna Rainbow RDCS Referring Phys: [id number] [place]  Sonographer Comments: Technically difficult study due to poor echo windows. Image acquisition challenging due to respiratory motion and Image acquisition challenging due to patient body habitus. Global longitudinal strain was attempted. IMPRESSIONS  1. Left ventricular ejection fraction, by estimation, is 60 to 65%. The left ventricle has normal function. The left ventricle has no regional wall motion abnormalities. There is mild asymmetric left ventricular hypertrophy of the septal segment. Left ventricular diastolic parameters are consistent with Grade I diastolic dysfunction (impaired relaxation). Elevated left ventricular end-diastolic  pressure. The average left ventricular global longitudinal strain is -18.1 %. The global longitudinal strain  is normal.  2. Right ventricular systolic function is normal. The right ventricular size is normal.  3. The mitral valve is normal in structure. Mild mitral valve regurgitation. No evidence of mitral stenosis.  4. The aortic valve was not well visualized. There is mild calcification of the aortic valve. There is moderate thickening of the aortic valve. Aortic valve regurgitation is moderate. Mild to  moderate aortic valve sclerosis/calcification is present, without any evidence of aortic stenosis. Aortic regurgitation PHT measures 435 msec.  5. Aortic dilatation noted. There is mild dilatation of the ascending aorta, measuring 38 mm.  6. The inferior vena cava is normal in size with greater than 50% respiratory variability, suggesting right atrial pressure of 3 mmHg. FINDINGS  Left Ventricle: Left ventricular ejection fraction, by estimation, is 60 to 65%. The left ventricle has normal function. The left ventricle has no regional wall motion abnormalities. The average left ventricular global longitudinal strain is -18.1 %. The global longitudinal strain is normal. The left ventricular internal cavity size was normal in size. There is mild asymmetric left ventricular hypertrophy of the septal segment. Left ventricular diastolic parameters are consistent with Grade I diastolic dysfunction (impaired relaxation). Elevated left ventricular end-diastolic pressure. Right Ventricle: The right ventricular size is normal. No increase in right ventricular wall thickness. Right ventricular systolic function is normal. Left Atrium: Left atrial size was normal in size. Right Atrium: Right atrial size was normal in size. Pericardium: There is no evidence of pericardial effusion. Mitral Valve: The mitral valve is normal in structure. Mild mitral valve regurgitation. No evidence of mitral valve stenosis. Tricuspid Valve: The  tricuspid valve is normal in structure. Tricuspid valve regurgitation is not demonstrated. No evidence of tricuspid stenosis. Aortic Valve: The aortic valve was not well visualized. There is mild calcification of the aortic valve. There is moderate thickening of the aortic valve. There is moderate aortic valve annular calcification. Aortic valve regurgitation is moderate. Aortic regurgitation PHT measures 435 msec. Mild to moderate aortic valve sclerosis/calcification is present, without any evidence of aortic stenosis. Pulmonic Valve: The pulmonic valve was normal in structure. Pulmonic valve regurgitation is not visualized. No evidence of pulmonic stenosis. Aorta: Aortic dilatation noted. There is mild dilatation of the ascending aorta, measuring 38 mm. Venous: The inferior vena cava is normal in size with greater than 50% respiratory variability, suggesting right atrial pressure of 3 mmHg. IAS/Shunts: No atrial level shunt detected by color flow Doppler.  LEFT VENTRICLE PLAX 2D LVIDd:         5.40 cm     Diastology LVIDs:         3.70 cm     LV e' medial:    3.68 cm/s LV PW:         1.00 cm     LV E/e' medial:  18.9 LV IVS:        1.20 cm     LV e' lateral:   8.76 cm/s LVOT diam:     2.10 cm     LV E/e' lateral: 7.9 LV SV:         99 LV SV Index:   52          2D Longitudinal Strain LVOT Area:     3.46 cm    2D Strain GLS (A2C):   -14.4 %                            2D Strain GLS (A3C):   -20.2 %                            2D Strain GLS (A4C):   -19.6 % LV Volumes (MOD)           2D Strain GLS Avg:     -18.1 % LV vol d, MOD  A2C: 86.8 ml LV vol d, MOD A4C: 85.6 ml LV vol s, MOD A2C: 44.1 ml LV vol s, MOD A4C: 25.1 ml LV SV MOD A2C:     42.7 ml LV SV MOD A4C:     85.6 ml LV SV MOD BP:      55.9 ml RIGHT VENTRICLE            IVC RV S prime:     9.99 cm/s  IVC diam: 2.50 cm TAPSE (M-mode): 2.6 cm LEFT ATRIUM             Index       RIGHT ATRIUM           Index LA diam:        3.80 cm 1.99 cm/m  RA Area:     18.70  cm LA Vol (A2C):   40.3 ml 21.10 ml/m RA Volume:   50.50 ml  26.44 ml/m LA Vol (A4C):   36.4 ml 19.06 ml/m LA Biplane Vol: 39.1 ml 20.47 ml/m  AORTIC VALVE LVOT Vmax:   127.00 cm/s LVOT Vmean:  78.800 cm/s LVOT VTI:    0.287 m AI PHT:      435 msec  AORTA Ao Root diam: 3.10 cm Ao Asc diam:  3.80 cm MITRAL VALVE MV Area (PHT): 2.54 cm    SHUNTS MV Decel Time: 299 msec    Systemic VTI:  0.29 m MV E velocity: 69.40 cm/s  Systemic Diam: 2.10 cm MV A velocity: 92.50 cm/s MV E/A ratio:  0.75 Fransico Him MD Electronically signed by Fransico Him MD Signature Date/Time: 03/29/2020/10:30:56 AM    Final    VAS US CAROTID  Result Date: 03/29/2020 Carotid Arterial Duplex Study Indications:  Syncope and Weakness. Risk Factors: Hypertension, hyperlipidemia. Performing Technologist: Griffin Basil RCT RDMS  Examination Guidelines: A complete evaluation includes B-mode imaging, spectral Doppler, color Doppler, and power Doppler as needed of all accessible portions of each vessel. Bilateral testing is considered an integral part of a complete examination. Limited examinations for reoccurring indications may be performed as noted.  Right Carotid Findings: +----------+--------+--------+--------+------------------+------------------+           PSV cm/sEDV cm/sStenosisPlaque DescriptionComments           +----------+--------+--------+--------+------------------+------------------+ CCA Prox  77      13                                                   +----------+--------+--------+--------+------------------+------------------+ CCA Distal71      10                                intimal thickening +----------+--------+--------+--------+------------------+------------------+ ICA Prox  86      15      1-39%                     intimal thickening +----------+--------+--------+--------+------------------+------------------+ ICA Distal61      15                                                    +----------+--------+--------+--------+------------------+------------------+ ECA       75  1                                                    +----------+--------+--------+--------+------------------+------------------+ +----------+--------+-------+--------+-------------------+           PSV cm/sEDV cmsDescribeArm Pressure (mmHG) +----------+--------+-------+--------+-------------------+ [id number]     1                                  +----------+--------+-------+--------+-------------------+ +---------+--------+--+--------+--+---------+ VertebralPSV cm/s43EDV cm/s12Antegrade +---------+--------+--+--------+--+---------+  Left Carotid Findings: +----------+--------+--------+--------+------------------+------------------+           PSV cm/sEDV cm/sStenosisPlaque DescriptionComments           +----------+--------+--------+--------+------------------+------------------+ CCA Prox  99      3                                                    +----------+--------+--------+--------+------------------+------------------+ CCA Distal79      16                                intimal thickening +----------+--------+--------+--------+------------------+------------------+ ICA Prox  72      18      1-39%                     intimal thickening +----------+--------+--------+--------+------------------+------------------+ ICA Distal96      25                                                   +----------+--------+--------+--------+------------------+------------------+ ECA       78      1                                                    +----------+--------+--------+--------+------------------+------------------+ +----------+--------+--------+--------+-------------------+           PSV cm/sEDV cm/sDescribeArm Pressure (mmHG) +----------+--------+--------+--------+-------------------+ Subclavian132                                          +----------+--------+--------+--------+-------------------+ +---------+--------+--+--------+--+---------+ VertebralPSV cm/s67EDV cm/s15Antegrade +---------+--------+--+--------+--+---------+   Summary: Right Carotid: Velocities in the right ICA are consistent with a 1-39% stenosis. Left Carotid: Velocities in the left ICA are consistent with a 1-39% stenosis. Vertebrals: Bilateral vertebral arteries demonstrate antegrade flow. *See table(s) above for measurements and observations.  Electronically signed by Antony Contras MD on 03/29/2020 at 5:05:55 PM.    Final     EKG: NSR at 75 bpm with 1st degree AV block and RBBB 152 ms  (personally reviewed)   Tele strip below associated with near syncope (9/20 17:40)     Strip below in [facility] ED and associated with near syncope. (9/19 13:27)   TELEMETRY: NSR 70-90s, + PVCS, episode of CHB noted last night with  near syncope (personally reviewed)  Assessment/Plan: 1.  Syncope -> symptomatic bradycardia CHB noted on tele in the setting of her symptoms.  Echo 03/29/2020 LVEF 60-65% CT head unremarkable. 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 Her last dose of Lopressor was Sunday am prior to 0700. She has had continued episodes with symptoms > 36 hours off of metoprolol and will likely require it in the future for her PAF and PVCs.  Explained risks, benefits, and alternatives to PPM implantation, including but not limited to bleeding, infection, pneumothorax, pericardial effusion, lead dislodgement, heart attack, stroke, or death.  Pt verbalized understanding and agrees to proceed if indicated.  She has been NPO  2. Hypokalemia K 3.4 -> 3.3 Supp ordered for 40 meq BID today  3. HTN Continue losartan HCT and amlodipine Can resume BB s/p PPM  4. PVCs Pt reports plans for ablation with Dr. Remus Blake in the near future. She will need close follow up there.   5. H/o lymphoma Reports that  she has received chest radiation.   Dr. Rayann Heman to see for further discussion.  For questions or updates, please contact [place] Please consult [email or web address] for contact info under Cardiology/STEMI.  Signed, Shirley Friar, PA-C  03/30/2020 8:21 AM  I have seen, examined the patient, and reviewed the above assessment and plan.  Changes to above are made where necessary.  On exam, RRR.  The patient has symptomatic transient complete heart block with syncope.  No reversible cause has been found.  I have spoken at length with Dr Remus Blake at [facility] who knows the patient well.  We agree that PPM is indicated at this time.  Risks, benefits, alternatives to pacemaker implantation were discussed in detail with the patient today. The patient understands that the risks include but are not limited to bleeding, infection, pneumothorax, perforation, tamponade, vascular damage, renal failure, MI, stroke, death,  and lead dislodgement and wishes to proceed.    Co Sign: Thompson Grayer, MD 03/30/2020 1:37 PM

## 2020-03-30 NOTE — Progress Notes (Signed)
This RN was called in pts room, pt having syncopal  episodes again, CHB on the monitor, HR 30's. Attending aware MD .  Paged Cards Pa, made aware,

## 2020-03-31 ENCOUNTER — Telehealth: Payer: Self-pay | Admitting: Internal Medicine

## 2020-03-31 ENCOUNTER — Inpatient Hospital Stay (HOSPITAL_COMMUNITY): Payer: Medicare Other

## 2020-03-31 DIAGNOSIS — K219 Gastro-esophageal reflux disease without esophagitis: Secondary | ICD-10-CM

## 2020-03-31 DIAGNOSIS — E6609 Other obesity due to excess calories: Secondary | ICD-10-CM

## 2020-03-31 DIAGNOSIS — E7849 Other hyperlipidemia: Secondary | ICD-10-CM

## 2020-03-31 DIAGNOSIS — Z6830 Body mass index (BMI) 30.0-30.9, adult: Secondary | ICD-10-CM

## 2020-03-31 LAB — GLUCOSE, CAPILLARY: Glucose-Capillary: 81 mg/dL (ref 70–99)

## 2020-03-31 MED ORDER — HYDROCHLOROTHIAZIDE 12.5 MG PO TABS
12.5000 mg | ORAL_TABLET | Freq: Every day | ORAL | 2 refills | Status: DC
Start: 2020-03-31 — End: 2021-01-21

## 2020-03-31 MED ORDER — MUPIROCIN 2 % EX OINT
1.0000 "application " | TOPICAL_OINTMENT | Freq: Two times a day (BID) | CUTANEOUS | Status: DC
  Administered 2020-03-31: 1 via NASAL
  Filled 2020-03-31: qty 22

## 2020-03-31 MED ORDER — CHLORHEXIDINE GLUCONATE CLOTH 2 % EX PADS
6.0000 | MEDICATED_PAD | Freq: Every day | CUTANEOUS | Status: DC
  Administered 2020-03-31: 6 via TOPICAL

## 2020-03-31 MED ORDER — HYDROCODONE-ACETAMINOPHEN 5-325 MG PO TABS
1.0000 | ORAL_TABLET | Freq: Four times a day (QID) | ORAL | 0 refills | Status: DC | PRN
Start: 2020-03-31 — End: 2020-07-12

## 2020-03-31 NOTE — Progress Notes (Signed)
Physical Therapy Treatment Patient Details Name: [name] MRN: [id number] DOB: [birth year] Today's Date: 03/31/2020    History of Present Illness 85 yr old with PAF transferred for dizziness and syncopal episodes.  Patient has history of A. fib, Mnire's, hypertension who presents to the ED after multiple syncopal episodes.  She states that she has been having episodes of syncope since last Friday.  Episodes last about 20 to 30 seconds with loss of consciousness and comes back quickly.  She feels weak following this.  Her daughter as visit witnessed these episodes and states that there was no seizures or any postictal state.  She had an episode while in the ER at [facility] where it was noted that she had sinus pause with extreme bradycardia.    PT Comments    Pt admitted with above diagnosis. Pt was able to ambulate with min guard assist to supervision and can withstand min challenges to balance. Pt reports no dizziness but reports she is not quite back to baseline.  Encouraged her to use the RW prn for safety intiially and pt agrees. Pt most likely to d/c today and daughter to provide care.  Pt currently with functional limitations due to balance and endurance deficits. Pt will benefit from skilled PT to increase their independence and safety with mobility to allow discharge to the venue listed below.     Follow Up Recommendations  No PT follow up     Equipment Recommendations  None recommended by PT    Recommendations for Other Services       Precautions / Restrictions Precautions Precautions: Fall Precaution Comments: Pacemaker precautions to LUE Restrictions Weight Bearing Restrictions: Yes LUE Weight Bearing: Weight bearing as tolerated Other Position/Activity Restrictions: discussed no forceful pushing and pulling.  no lifting over 5 lbs.    Mobility  Bed Mobility Overal bed mobility: Independent Bed Mobility: Supine to Sit;Sit to Supine     Supine to sit:  Independent Sit to supine: Independent   General bed mobility comments: performed supine<>sit I  Transfers Overall transfer level: Independent               General transfer comment: No assist needed and pt cautious to follow pacemaker precautions  Ambulation/Gait Ambulation/Gait assistance: Supervision;Min guard Gait Distance (Feet): 350 Feet Assistive device: None Gait Pattern/deviations: Step-through pattern;Decreased stride length   Gait velocity interpretation: <1.8 ft/sec, indicate of risk for recurrent falls General Gait Details: Pt was able to ambulate in hallway with overall steady gait. Pt reports no dizziness and feels overall good but just not quite to baseline therefore instructed to use the RW as needed until she feels back to her baseline and pt and daughter agree. Can withstand min challenges to balance as well.    Stairs Stairs:  (declined practice on steps)           Wheelchair Mobility    Modified Rankin (Stroke Patients Only)       Balance Overall balance assessment: Independent;Needs assistance                           High level balance activites: Direction changes;Turns;Sudden stops;Head turns High Level Balance Comments: Did all of above with min guard/supervision            Cognition Arousal/Alertness: Awake/alert Behavior During Therapy: WFL for tasks assessed/performed Overall Cognitive Status: Within Functional Limits for tasks assessed  Exercises      General Comments        Pertinent Vitals/Pain Pain Assessment: 0-10 Pain Score: 2  Pain Descriptors / Indicators: Sore Pain Intervention(s): Monitored during session    Home Living Family/patient expects to be discharged to:: Private residence Living Arrangements: Children Available Help at Discharge: Family Type of Home: House Home Access: Stairs to enter Entrance Stairs-Rails: None Home Layout:  Two level;Able to live on main level with bedroom/bathroom Home Equipment: Walker - 4 wheels;Shower seat;Grab bars - tub/shower;Grab bars - toilet      Prior Function Level of Independence: Independent          PT Goals (current goals can now be found in the care plan section) Acute Rehab PT Goals Patient Stated Goal: get back into my routine Progress towards PT goals: Progressing toward goals    Frequency    Min 3X/week      PT Plan Current plan remains appropriate    Co-evaluation              AM-PAC PT "6 Clicks" Mobility   Outcome Measure  Help needed turning from your back to your side while in a flat bed without using bedrails?: None Help needed moving from lying on your back to sitting on the side of a flat bed without using bedrails?: None Help needed moving to and from a bed to a chair (including a wheelchair)?: None Help needed standing up from a chair using your arms (e.g., wheelchair or bedside chair)?: None Help needed to walk in hospital room?: None Help needed climbing 3-5 steps with a railing? : A Little 6 Click Score: 23    End of Session Equipment Utilized During Treatment: Gait belt Activity Tolerance: Patient tolerated treatment well Patient left: in bed;with call bell/phone within reach;with family/visitor present;with bed alarm set Nurse Communication: Mobility status PT Visit Diagnosis: Unsteadiness on feet (R26.81)     Time: 0092-3300 PT Time Calculation (min) (ACUTE ONLY): 13 min  Charges:  $Gait Training: 8-22 mins                     Jakiera Ehler W,PT Acute Rehabilitation Services Pager:  [phone]  Office:  [place] 03/31/2020, 11:30 AM

## 2020-03-31 NOTE — Plan of Care (Signed)
  Problem: Education: Goal: Ability to demonstrate management of disease process will improve Outcome: Adequate for Discharge Goal: Ability to verbalize understanding of medication therapies will improve Outcome: Adequate for Discharge Goal: Individualized Educational Video(s) Outcome: Adequate for Discharge   Problem: Activity: Goal: Capacity to carry out activities will improve Outcome: Adequate for Discharge   Problem: Cardiac: Goal: Ability to achieve and maintain adequate cardiopulmonary perfusion will improve Outcome: Adequate for Discharge   Problem: Education: Goal: Knowledge of General Education information will improve Description: Including pain rating scale, medication(s)/side effects and non-pharmacologic comfort measures Outcome: Adequate for Discharge   Problem: Health Behavior/Discharge Planning: Goal: Ability to manage health-related needs will improve Outcome: Adequate for Discharge   Problem: Clinical Measurements: Goal: Ability to maintain clinical measurements within normal limits will improve Outcome: Adequate for Discharge Goal: Will remain free from infection Outcome: Adequate for Discharge Goal: Diagnostic test results will improve Outcome: Adequate for Discharge Goal: Respiratory complications will improve Outcome: Adequate for Discharge Goal: Cardiovascular complication will be avoided Outcome: Adequate for Discharge   Problem: Activity: Goal: Risk for activity intolerance will decrease Outcome: Adequate for Discharge   Problem: Nutrition: Goal: Adequate nutrition will be maintained Outcome: Adequate for Discharge   Problem: Elimination: Goal: Will not experience complications related to bowel motility Outcome: Adequate for Discharge   Problem: Safety: Goal: Ability to remain free from injury will improve Outcome: Adequate for Discharge

## 2020-03-31 NOTE — Telephone Encounter (Signed)
I let the pt daughter Cyril Mourning know that the pt is now in [employer] system. I gave her verbal instructions with the monitor. I let her know I may not be able to see the transmission 24-72 hours since I just enrolled her in the system.

## 2020-03-31 NOTE — Progress Notes (Addendum)
Electrophysiology Rounding Note  Patient Name: [name] Date of Encounter: 03/31/2020  Primary Cardiologist: Dr. Remus Blake [phone]) Electrophysiologist: Dr. Rayann Heman -> [facility] Blake [facility]   Subjective   The patient is doing well today.  At this time, the patient denies chest pain, shortness of breath, or any new concerns.  CXR stable lead placement and no ptx.  Inpatient Medications    Scheduled Meds: . amLODipine  5 mg Oral Daily  . Chlorhexidine Gluconate Cloth  6 each Topical Q0600  . gabapentin  100 mg Oral BID  . hydrochlorothiazide  12.5 mg Oral Daily  . hydroxychloroquine  200 mg Oral BID  . levothyroxine  112 mcg Oral Q0600  . losartan  100 mg Oral Daily  . mupirocin ointment  1 application Nasal BID  . pantoprazole  40 mg Oral Daily  . pravastatin  80 mg Oral QHS  . sodium chloride flush  3 mL Intravenous Q12H   Continuous Infusions: . sodium chloride    .  ceFAZolin (ANCEF) IV 1 g (03/31/20 0420)   PRN Meds: sodium chloride, acetaminophen, HYDROcodone-acetaminophen, ipratropium, ondansetron (ZOFRAN) IV, polyvinyl alcohol, sodium chloride flush   Vital Signs    Vitals:   03/31/20 0006 03/31/20 0348 03/31/20 0416 03/31/20 0809  BP: (!) 149/70  (!) 149/69 (!) 174/79  Pulse: 76   74  Resp: 18  20 20   Temp: 97.6 F (36.4 C)  97.9 F (36.6 C) 98 F (36.7 C)  TempSrc: Oral  Oral Oral  SpO2: 96%  96% 96%  Weight:  84.4 kg    Height:        Intake/Output Summary (Last 24 hours) at 03/31/2020 0846 Last data filed at 03/31/2020 3474 Gross per 24 hour  Intake 1233 ml  Output 700 ml  Net 533 ml   Filed Weights   03/29/20 0542 03/30/20 0159 03/31/20 0348  Weight: 83.6 kg 84.8 kg 84.4 kg    Physical Exam    GEN- The patient is well appearing, alert and oriented x 3 today.   Head- normocephalic, atraumatic Eyes-  Sclera clear, conjunctiva pink Ears- hearing intact Oropharynx- clear Neck- supple Lungs- Clear to ausculation bilaterally, normal work of  breathing Heart- Regular rate and rhythm, no murmurs, rubs or gallops GI- soft, NT, ND, + BS Extremities- no clubbing or cyanosis. No edema Skin- no rash or lesion Psych- euthymic mood, full affect Neuro- strength and sensation are intact  Labs    CBC Recent Labs    03/28/20 1325 03/29/20 0422  WBC 6.7 5.5  NEUTROABS 5.0  --   HGB 12.5 12.3  HCT 37.8 37.0  MCV 87.5 87.9  PLT 220 259   Basic Metabolic Panel Recent Labs    03/28/20 1325 03/28/20 1325 03/29/20 0422 03/30/20 0343  NA 135   < > 137 135  K 3.6   < > 3.4* 3.3*  CL 97*   < > 99 98  CO2 28   < > 29 31  GLUCOSE 107*   < > 107* 107*  BUN 14   < > 13 14  CREATININE 1.02*   < > 1.37* 1.20*  CALCIUM 8.9   < > 9.2 9.0  MG 2.1  --   --   --    < > = values in this interval not displayed.   Liver Function Tests Recent Labs    03/28/20 1325 03/29/20 0422  AST 19 19  ALT 17 16  ALKPHOS 35* 36*  BILITOT 0.9  1.1  PROT 6.9 6.1*  ALBUMIN 3.8 3.3*   Recent Labs    03/28/20 1325  LIPASE 41   Cardiac Enzymes No results for input(s): CKTOTAL, CKMB, CKMBINDEX, TROPONINI in the last 72 hours.   Telemetry    AS- VP 70s (personally reviewed)  Radiology    DG Chest 2 View  Result Date: 03/31/2020 CLINICAL DATA:  Cardiac device in situ. EXAM: CHEST - 2 VIEW COMPARISON:  March 28, 2020. FINDINGS: Stable cardiomediastinal silhouette. Interval placement of left-sided pacemaker with leads in grossly good position. No pneumothorax or pleural effusion is noted. Lungs are clear. Bony thorax is unremarkable. IMPRESSION: Interval placement of left-sided pacemaker with leads in grossly good position. Electronically Signed   By: Marijo Conception M.D.   On: 03/31/2020 08:02   EP PPM/ICD IMPLANT  Result Date: 03/30/2020 SURGEON:  Thompson Grayer, MD   PREPROCEDURE DIAGNOSIS:  Symptomatic mobitz II second degree AV block with transient complete heart block and syncope   POSTPROCEDURE DIAGNOSIS:  Symptomatic mobitz II second  degree AV block with transient complete heart block and syncope    PROCEDURES:  1. Pacemaker implantation.  2. Left upper extremity venography.     INTRODUCTION: [name] is a 85 y.o. female  with a history of longstanding conduction system disease.   She is admitted with recurrent syncope and is found to have mobitz II second degree AV block with transient complete heart block as the cause.  She presents today for pacemaker implantation.   DESCRIPTION OF PROCEDURE:  Informed written consent was obtained, and the patient was brought to the electrophysiology lab in a fasting state.  The patient received IV Versed and Fentanyl as sedation for the procedure today.  The patients left chest was prepped and draped in the usual sterile fashion by the EP lab staff. The skin overlying the left deltopectoral region was infiltrated with lidocaine for local analgesia.  A 4-cm incision was made over the left deltopectoral region.  A left subcutaneous pacemaker pocket was fashioned using a combination of sharp and blunt dissection. Electrocautery was required to assure hemostasis.  Left Upper Extremity Venography: A venogram of the left upper extremity was performed, which revealed a very small left cephalic vein, which emptied into a small left subclavian vein.  The left axillary vein was small in size.  RA/RV Lead Placement: The left axillary vein was cannulated.  Through the left axillary vein, a Medtronic model [id number] (serial number [id number]) right atrial lead and a Medtronic [id number] (serial number [id number]) right ventricular lead were advanced with fluoroscopic visualization into the right atrial appendage and right ventricular septal positions respectively.  Initial atrial lead P- waves measured 3 mV with impedance of 813 ohms and a threshold of  0.5 V at 0.5 msec.  Right ventricular lead R-waves measured 7 mV with an impedance of 607 ohms and a threshold of 0.8 V at 0.5 msec.  Both leads were secured to the  pectoralis fascia using #2-0 silk over the suture sleeves.  Left bundle pacing was performed.  A late r' was noted in V1 with pacing.  In addition, the left ventricular activation time was 76 msec.  The QRS during pacing was 124 msec. Device Placement:  The leads were then connected to a Medtronic Azure XT DR MRI SureScan model [id number] (serial number [id number]) pacemaker.  The pocket was irrigated with copious gentamicin solution.  The pacemaker was then placed into the pocket.  The pocket was then closed  in 2 layers with 2.0 Vicryl suture for the subcutaneous and subcuticular layers.  Steri- Strips and a sterile dressing were then applied. EBL<81ml. There were no early apparent complications. Permanent Pacemaker Indication: Documented non-reversible symptomatic bradycardia due to second degree and/or third degree atrioventricular block. During this procedure the patient is administered a total of Versed 3 mg and Fentanyl 37.5 mg to achieve and maintain moderate conscious sedation.  The patient's heart rate, blood pressure, and oxygen saturation are monitored continuously during the procedure. The period of conscious sedation is 63 minutes, of which I was present face-to-face 100% of this time.   CONCLUSIONS:  1. Successful implantation of a Medtronic Azure XT MRI conditional dual-chamber pacemaker for symptomatic symptomatic mobitz II second degree AV block with transient complete heart block and syncope.  Left bundle pacing was performed with nice result achieved.  2. No early apparent complications.     Thompson Grayer, MD 03/30/2020 6:01 PM  ECHOCARDIOGRAM COMPLETE  Result Date: 03/29/2020    ECHOCARDIOGRAM REPORT   Patient Name:   [name] Date of Exam: 03/29/2020 Medical Rec #:  [id number]     Height:       65.0 in Accession #:    [id number]    Weight:       184.2 lb Date of Birth:  06-22-1935     BSA:          1.910 m Patient Age:    5 years      BP:           159/98 mmHg Patient Gender: F              HR:           63 bpm. Exam Location:  Inpatient Procedure: 2D Echo, 3D Echo, Cardiac Doppler, Color Doppler and Strain Analysis Indications:    R55 Syncope  History:        Patient has no prior history of Echocardiogram examinations.                 Abnormal ECG, Arrythmias:Atrial Fibrillation,                 Signs/Symptoms:Dyspnea and Syncope; Risk Factors:Hypertension.                 Lymphoma.  Sonographer:    Roseanna Rainbow RDCS Referring Phys: [id number] [place]  Sonographer Comments: Technically difficult study due to poor echo windows. Image acquisition challenging due to respiratory motion and Image acquisition challenging due to patient body habitus. Global longitudinal strain was attempted. IMPRESSIONS  1. Left ventricular ejection fraction, by estimation, is 60 to 65%. The left ventricle has normal function. The left ventricle has no regional wall motion abnormalities. There is mild asymmetric left ventricular hypertrophy of the septal segment. Left ventricular diastolic parameters are consistent with Grade I diastolic dysfunction (impaired relaxation). Elevated left ventricular end-diastolic pressure. The average left ventricular global longitudinal strain is -18.1 %. The global longitudinal strain  is normal.  2. Right ventricular systolic function is normal. The right ventricular size is normal.  3. The mitral valve is normal in structure. Mild mitral valve regurgitation. No evidence of mitral stenosis.  4. The aortic valve was not well visualized. There is mild calcification of the aortic valve. There is moderate thickening of the aortic valve. Aortic valve regurgitation is moderate. Mild to moderate aortic valve sclerosis/calcification is present, without any evidence of aortic stenosis. Aortic regurgitation PHT measures 435 msec.  5. Aortic  dilatation noted. There is mild dilatation of the ascending aorta, measuring 38 mm.  6. The inferior vena cava is normal in size with greater than 50%  respiratory variability, suggesting right atrial pressure of 3 mmHg. FINDINGS  Left Ventricle: Left ventricular ejection fraction, by estimation, is 60 to 65%. The left ventricle has normal function. The left ventricle has no regional wall motion abnormalities. The average left ventricular global longitudinal strain is -18.1 %. The global longitudinal strain is normal. The left ventricular internal cavity size was normal in size. There is mild asymmetric left ventricular hypertrophy of the septal segment. Left ventricular diastolic parameters are consistent with Grade I diastolic dysfunction (impaired relaxation). Elevated left ventricular end-diastolic pressure. Right Ventricle: The right ventricular size is normal. No increase in right ventricular wall thickness. Right ventricular systolic function is normal. Left Atrium: Left atrial size was normal in size. Right Atrium: Right atrial size was normal in size. Pericardium: There is no evidence of pericardial effusion. Mitral Valve: The mitral valve is normal in structure. Mild mitral valve regurgitation. No evidence of mitral valve stenosis. Tricuspid Valve: The tricuspid valve is normal in structure. Tricuspid valve regurgitation is not demonstrated. No evidence of tricuspid stenosis. Aortic Valve: The aortic valve was not well visualized. There is mild calcification of the aortic valve. There is moderate thickening of the aortic valve. There is moderate aortic valve annular calcification. Aortic valve regurgitation is moderate. Aortic regurgitation PHT measures 435 msec. Mild to moderate aortic valve sclerosis/calcification is present, without any evidence of aortic stenosis. Pulmonic Valve: The pulmonic valve was normal in structure. Pulmonic valve regurgitation is not visualized. No evidence of pulmonic stenosis. Aorta: Aortic dilatation noted. There is mild dilatation of the ascending aorta, measuring 38 mm. Venous: The inferior vena cava is normal in size  with greater than 50% respiratory variability, suggesting right atrial pressure of 3 mmHg. IAS/Shunts: No atrial level shunt detected by color flow Doppler.  LEFT VENTRICLE PLAX 2D LVIDd:         5.40 cm     Diastology LVIDs:         3.70 cm     LV e' medial:    3.68 cm/s LV PW:         1.00 cm     LV E/e' medial:  18.9 LV IVS:        1.20 cm     LV e' lateral:   8.76 cm/s LVOT diam:     2.10 cm     LV E/e' lateral: 7.9 LV SV:         99 LV SV Index:   52          2D Longitudinal Strain LVOT Area:     3.46 cm    2D Strain GLS (A2C):   -14.4 %                            2D Strain GLS (A3C):   -20.2 %                            2D Strain GLS (A4C):   -19.6 % LV Volumes (MOD)           2D Strain GLS Avg:     -18.1 % LV vol d, MOD A2C: 86.8 ml LV vol d, MOD A4C: 85.6 ml LV vol s, MOD A2C: 44.1 ml LV vol s,  MOD A4C: 25.1 ml LV SV MOD A2C:     42.7 ml LV SV MOD A4C:     85.6 ml LV SV MOD BP:      55.9 ml RIGHT VENTRICLE            IVC RV S prime:     9.99 cm/s  IVC diam: 2.50 cm TAPSE (M-mode): 2.6 cm LEFT ATRIUM             Index       RIGHT ATRIUM           Index LA diam:        3.80 cm 1.99 cm/m  RA Area:     18.70 cm LA Vol (A2C):   40.3 ml 21.10 ml/m RA Volume:   50.50 ml  26.44 ml/m LA Vol (A4C):   36.4 ml 19.06 ml/m LA Biplane Vol: 39.1 ml 20.47 ml/m  AORTIC VALVE LVOT Vmax:   127.00 cm/s LVOT Vmean:  78.800 cm/s LVOT VTI:    0.287 m AI PHT:      435 msec  AORTA Ao Root diam: 3.10 cm Ao Asc diam:  3.80 cm MITRAL VALVE MV Area (PHT): 2.54 cm    SHUNTS MV Decel Time: 299 msec    Systemic VTI:  0.29 m MV E velocity: 69.40 cm/s  Systemic Diam: 2.10 cm MV A velocity: 92.50 cm/s MV E/A ratio:  0.75 Jillian Him MD Electronically signed by Jillian Him MD Signature Date/Time: 03/29/2020/10:30:56 AM    Final    VAS US CAROTID  Result Date: 03/29/2020 Carotid Arterial Duplex Study Indications:  Syncope and Weakness. Risk Factors: Hypertension, hyperlipidemia. Performing Technologist: Griffin Basil RCT RDMS   Examination Guidelines: A complete evaluation includes B-mode imaging, spectral Doppler, color Doppler, and power Doppler as needed of all accessible portions of each vessel. Bilateral testing is considered an integral part of a complete examination. Limited examinations for reoccurring indications may be performed as noted.  Right Carotid Findings: +----------+--------+--------+--------+------------------+------------------+           PSV cm/sEDV cm/sStenosisPlaque DescriptionComments           +----------+--------+--------+--------+------------------+------------------+ CCA Prox  77      13                                                   +----------+--------+--------+--------+------------------+------------------+ CCA Distal71      10                                intimal thickening +----------+--------+--------+--------+------------------+------------------+ ICA Prox  86      15      1-39%                     intimal thickening +----------+--------+--------+--------+------------------+------------------+ ICA Distal61      15                                                   +----------+--------+--------+--------+------------------+------------------+ ECA       75      1                                                    +----------+--------+--------+--------+------------------+------------------+ +----------+--------+-------+--------+-------------------+  PSV cm/sEDV cmsDescribeArm Pressure (mmHG) +----------+--------+-------+--------+-------------------+ [id number]     1                                  +----------+--------+-------+--------+-------------------+ +---------+--------+--+--------+--+---------+ VertebralPSV cm/s43EDV cm/s12Antegrade +---------+--------+--+--------+--+---------+  Left Carotid Findings: +----------+--------+--------+--------+------------------+------------------+           PSV cm/sEDV cm/sStenosisPlaque  DescriptionComments           +----------+--------+--------+--------+------------------+------------------+ CCA Prox  99      3                                                    +----------+--------+--------+--------+------------------+------------------+ CCA Distal79      16                                intimal thickening +----------+--------+--------+--------+------------------+------------------+ ICA Prox  72      18      1-39%                     intimal thickening +----------+--------+--------+--------+------------------+------------------+ ICA Distal96      25                                                   +----------+--------+--------+--------+------------------+------------------+ ECA       78      1                                                    +----------+--------+--------+--------+------------------+------------------+ +----------+--------+--------+--------+-------------------+           PSV cm/sEDV cm/sDescribeArm Pressure (mmHG) +----------+--------+--------+--------+-------------------+ Subclavian132                                         +----------+--------+--------+--------+-------------------+ +---------+--------+--+--------+--+---------+ VertebralPSV cm/s67EDV cm/s15Antegrade +---------+--------+--+--------+--+---------+   Summary: Right Carotid: Velocities in the right ICA are consistent with a 1-39% stenosis. Left Carotid: Velocities in the left ICA are consistent with a 1-39% stenosis. Vertebrals: Bilateral vertebral arteries demonstrate antegrade flow. *See table(s) above for measurements and observations.  Electronically signed by Antony Contras MD on 03/29/2020 at 5:05:55 PM.    Final     Patient Profile     [name] is a 85 y.o. female with a past medical history significant for PVCs, syncope, HTN, and h/o lymphoma.  she was admitted for syncope and found to have intermittent CHB. Now s/p PPM.    Assessment & Plan     1.  Syncope -> symptomatic bradycardia due to CHB Now s/p dual chamber MDT ppm 03/30/20 Echo 03/29/2020 LVEF 60-65% CXR this am with stable leads and no pneumothorax.  OK to resume lopressor for PVC suppression.   2. Hypokalemia Supped yesterday, no labs today.   3. HTN Continue losartan HCT and amlodipine Can resume BB now s/p PPM  4. PVCs Pt reports plans for ablation with Dr.  Remus Blake in the near future.  They are aware of her admission and she has follow up next month.   5. H/o lymphoma Reports that she has received chest radiation. Stable.   Wound care and arm restrictions reviewed with patient. She is OK to resume BB for PVC suppression now s/p PPM.   She is clear for discharge from a cardiology perspective.  EP will see as needed if remains here.  She will have usual follow up for wound check and 91 day follow up with [place], but then be followed by Dr. Remus Blake at [facility] chronically.   For questions or updates, please contact [place] Please consult [email or web address] for contact info under Cardiology/STEMI.  Signed, Shirley Friar, PA-C  03/31/2020, 8:46 AM    I have seen, examined the patient, and reviewed the above assessment and plan.  Changes to above are made where necessary.  On exam, RRR.  Doing well s/p PPM CXR reveals stable leads, no ptx. Device interrogation is personally reviewed and normal  Co Sign: Thompson Grayer, MD 04/02/2020 12:07 PM

## 2020-03-31 NOTE — Discharge Summary (Signed)
Physician Discharge Summary  [name] CVE:[id number] DOB: 03-11-1935 DOA: 03/28/2020  PCP: [name], DO  Admit date: 03/28/2020 Discharge date: 03/31/2020  Admitted From: Home  Discharge disposition: Home   Recommendations for Outpatient Follow-Up:   . Follow up with your primary care provider in one week.  . Check CBC, BMP, magnesium in the next visit . Follow-up with electrophysiology cardiology for pacemaker checkup as scheduled by the clinic. . Follow-up with your cardiologist after discharge.   Discharge Diagnosis:   Principal Problem:   Syncope Active Problems:   Hypothyroidism   Benign essential hypertension   Class 1 obesity due to excess calories with serious comorbidity and body mass index (BMI) of 30.0 to 02.5 in adult   Follicular lymphoma (HCC)   GERD (gastroesophageal reflux disease)   Other hyperlipidemia   Discharge Condition: Improved.  Diet recommendation: Low sodium, heart healthy.   Wound care: Local pacer site care  Code status: Full.    History of Present Illness:   85 year old woman PMH paroxysmal atrial fibrillation presenting with recurrent syncope. Had a syncopal episode in the emergency department. Telemetry was consistent with transient complete heart block. Admitted for SSS.  Complete heart block intermittently despite being off beta-blocker.  Seen by cardiology and electrophysiology, status post pacemaker placement 9/21.    Hospital Course:   Following conditions were addressed during hospitalization as listed below,  Syncope Likely cardiogenic secondary to symptomatic Mobitz 2 secondary AV block with transient complete heart block.  Status post permanent pacemaker placement on 03/30/2020.  Has been restarted on beta-blocker on discharge as per cardiology.  2D echocardiogram performed showed LVEF 85-27%, grad I diastolic dysfunction, normal RN, moderate AV regurg.  Carotid ultrasound showed no significant stenosis.  Dose  of hydrochlorothiazide has been decreased on discharge  Benign essential hypertension continue HCTZ and losartan, metoprolol has been resumed after pacemaker placement..  Dose of HCTZ has been decreased.  Hypothyroidism Continue Synthroid on discharge.  Disposition.  At this time, patient is stable for disposition home.  Spoke with the patient's daughter at bedside.  Patient will need to follow-up with her primary care physician, cardiology for pacemaker checkup and with her cardiology as outpatient.  Medical Consultants:    Cardiology  Electrophysiology cardiology  Procedures:    Permanent pacemaker placement on 03/30/2020 Subjective:   Today, patient   Discharge Exam:   Vitals:   03/31/20 0853 03/31/20 1113  BP: (!) 186/88 (!) 181/76  Pulse: 87 72  Resp: 18 18  Temp: 98.2 F (36.8 C) 98.1 F (36.7 C)  SpO2: 98% 97%   Vitals:   03/31/20 0416 03/31/20 0809 03/31/20 0853 03/31/20 1113  BP: (!) 149/69 (!) 174/79 (!) 186/88 (!) 181/76  Pulse:  74 87 72  Resp: 20 20 18 18   Temp: 97.9 F (36.6 C) 98 F (36.7 C) 98.2 F (36.8 C) 98.1 F (36.7 C)  TempSrc: Oral Oral Oral Oral  SpO2: 96% 96% 98% 97%  Weight:      Height:       General: Alert awake, not in obvious distress HENT: pupils equally reacting to light,  No scleral pallor or icterus noted. Oral mucosa is moist.  Chest:  Clear breath sounds.  Diminished breath sounds bilaterally. No crackles or wheezes.  Left chest wall pacer site without any erythema or induration. CVS: S1 &S2 heard. No murmur.  Regular rate and rhythm. Abdomen: Soft, nontender, nondistended.  Bowel sounds are heard.   Extremities: No cyanosis, clubbing or edema.  Peripheral pulses are palpable. Psych: Alert, awake and communicative. CNS:  No cranial nerve deficits.  Power equal in all extremities.   Skin: Warm and dry.  No rashes noted.  The results of significant diagnostics from this hospitalization (including imaging, microbiology,  ancillary and laboratory) are listed below for reference.     Diagnostic Studies:   CT Head Wo Contrast  Result Date: 03/28/2020 CLINICAL DATA:  Minor head trauma. Multiple recent syncopal episodes. EXAM: CT HEAD WITHOUT CONTRAST TECHNIQUE: Contiguous axial images were obtained from the base of the skull through the vertex without intravenous contrast. COMPARISON:  March 26, 2020 FINDINGS: Brain: No evidence of acute infarction, hemorrhage, hydrocephalus, extra-axial collection or mass lesion/mass effect. Vascular: No hyperdense vessel or unexpected calcification. Skull: Normal. Negative for fracture or focal lesion. Sinuses/Orbits: Scattered opacification in the frontal and ethmoid sinuses. There is a small amount of fluid in maxillary sinuses. Previous sinus surgery is identified. Mastoid air cells and middle ears are well aerated. Other: None. IMPRESSION: 1. Sinus disease as above. 2. No acute intracranial abnormalities. Electronically Signed   By: Dorise Bullion III M.D   On: 03/28/2020 14:43   DG Chest Portable 1 View  Result Date: 03/28/2020 CLINICAL DATA:  Syncope. EXAM: PORTABLE CHEST 1 VIEW COMPARISON:  March 26, 2020 FINDINGS: A transcutaneous pacer partially overlies and obscures the left medial upper chest. No pneumothorax. The heart, hila, mediastinum are unchanged. The lungs remain clear. Minimal atelectasis in the left base. IMPRESSION: No active disease. Electronically Signed   By: Dorise Bullion III M.D   On: 03/28/2020 14:44   ECHOCARDIOGRAM COMPLETE  Result Date: 03/29/2020    ECHOCARDIOGRAM REPORT   Patient Name:   [name] Date of Exam: 03/29/2020 Medical Rec #:  [id number]     Height:       65.0 in Accession #:    [id number]    Weight:       184.2 lb Date of Birth:  [birth year]     BSA:          1.910 m Patient Age:    28 years      BP:           159/98 mmHg Patient Gender: F             HR:           63 bpm. Exam Location:  Inpatient Procedure: 2D Echo, 3D Echo,  Cardiac Doppler, Color Doppler and Strain Analysis Indications:    R55 Syncope  History:        Patient has no prior history of Echocardiogram examinations.                 Abnormal ECG, Arrythmias:Atrial Fibrillation,                 Signs/Symptoms:Dyspnea and Syncope; Risk Factors:Hypertension.                 Lymphoma.  Sonographer:    Roseanna Rainbow RDCS Referring Phys: [id number] Thomas  Sonographer Comments: Technically difficult study due to poor echo windows. Image acquisition challenging due to respiratory motion and Image acquisition challenging due to patient body habitus. Global longitudinal strain was attempted. IMPRESSIONS  1. Left ventricular ejection fraction, by estimation, is 60 to 65%. The left ventricle has normal function. The left ventricle has no regional wall motion abnormalities. There is mild asymmetric left ventricular hypertrophy of the septal segment. Left ventricular diastolic parameters are consistent with Grade I  diastolic dysfunction (impaired relaxation). Elevated left ventricular end-diastolic pressure. The average left ventricular global longitudinal strain is -18.1 %. The global longitudinal strain  is normal.  2. Right ventricular systolic function is normal. The right ventricular size is normal.  3. The mitral valve is normal in structure. Mild mitral valve regurgitation. No evidence of mitral stenosis.  4. The aortic valve was not well visualized. There is mild calcification of the aortic valve. There is moderate thickening of the aortic valve. Aortic valve regurgitation is moderate. Mild to moderate aortic valve sclerosis/calcification is present, without any evidence of aortic stenosis. Aortic regurgitation PHT measures 435 msec.  5. Aortic dilatation noted. There is mild dilatation of the ascending aorta, measuring 38 mm.  6. The inferior vena cava is normal in size with greater than 50% respiratory variability, suggesting right atrial pressure of 3 mmHg. FINDINGS  Left  Ventricle: Left ventricular ejection fraction, by estimation, is 60 to 65%. The left ventricle has normal function. The left ventricle has no regional wall motion abnormalities. The average left ventricular global longitudinal strain is -18.1 %. The global longitudinal strain is normal. The left ventricular internal cavity size was normal in size. There is mild asymmetric left ventricular hypertrophy of the septal segment. Left ventricular diastolic parameters are consistent with Grade I diastolic dysfunction (impaired relaxation). Elevated left ventricular end-diastolic pressure. Right Ventricle: The right ventricular size is normal. No increase in right ventricular wall thickness. Right ventricular systolic function is normal. Left Atrium: Left atrial size was normal in size. Right Atrium: Right atrial size was normal in size. Pericardium: There is no evidence of pericardial effusion. Mitral Valve: The mitral valve is normal in structure. Mild mitral valve regurgitation. No evidence of mitral valve stenosis. Tricuspid Valve: The tricuspid valve is normal in structure. Tricuspid valve regurgitation is not demonstrated. No evidence of tricuspid stenosis. Aortic Valve: The aortic valve was not well visualized. There is mild calcification of the aortic valve. There is moderate thickening of the aortic valve. There is moderate aortic valve annular calcification. Aortic valve regurgitation is moderate. Aortic regurgitation PHT measures 435 msec. Mild to moderate aortic valve sclerosis/calcification is present, without any evidence of aortic stenosis. Pulmonic Valve: The pulmonic valve was normal in structure. Pulmonic valve regurgitation is not visualized. No evidence of pulmonic stenosis. Aorta: Aortic dilatation noted. There is mild dilatation of the ascending aorta, measuring 38 mm. Venous: The inferior vena cava is normal in size with greater than 50% respiratory variability, suggesting right atrial pressure of 3  mmHg. IAS/Shunts: No atrial level shunt detected by color flow Doppler.  LEFT VENTRICLE PLAX 2D LVIDd:         5.40 cm     Diastology LVIDs:         3.70 cm     LV e' medial:    3.68 cm/s LV PW:         1.00 cm     LV E/e' medial:  18.9 LV IVS:        1.20 cm     LV e' lateral:   8.76 cm/s LVOT diam:     2.10 cm     LV E/e' lateral: 7.9 LV SV:         99 LV SV Index:   52          2D Longitudinal Strain LVOT Area:     3.46 cm    2D Strain GLS (A2C):   -14.4 %  2D Strain GLS (A3C):   -20.2 %                            2D Strain GLS (A4C):   -19.6 % LV Volumes (MOD)           2D Strain GLS Avg:     -18.1 % LV vol d, MOD A2C: 86.8 ml LV vol d, MOD A4C: 85.6 ml LV vol s, MOD A2C: 44.1 ml LV vol s, MOD A4C: 25.1 ml LV SV MOD A2C:     42.7 ml LV SV MOD A4C:     85.6 ml LV SV MOD BP:      55.9 ml RIGHT VENTRICLE            IVC RV S prime:     9.99 cm/s  IVC diam: 2.50 cm TAPSE (M-mode): 2.6 cm LEFT ATRIUM             Index       RIGHT ATRIUM           Index LA diam:        3.80 cm 1.99 cm/m  RA Area:     18.70 cm LA Vol (A2C):   40.3 ml 21.10 ml/m RA Volume:   50.50 ml  26.44 ml/m LA Vol (A4C):   36.4 ml 19.06 ml/m LA Biplane Vol: 39.1 ml 20.47 ml/m  AORTIC VALVE LVOT Vmax:   127.00 cm/s LVOT Vmean:  78.800 cm/s LVOT VTI:    0.287 m AI PHT:      435 msec  AORTA Ao Root diam: 3.10 cm Ao Asc diam:  3.80 cm MITRAL VALVE MV Area (PHT): 2.54 cm    SHUNTS MV Decel Time: 299 msec    Systemic VTI:  0.29 m MV E velocity: 69.40 cm/s  Systemic Diam: 2.10 cm MV A velocity: 92.50 cm/s MV E/A ratio:  0.75 Fransico Him MD Electronically signed by Fransico Him MD Signature Date/Time: 03/29/2020/10:30:56 AM    Final    VAS US CAROTID  Result Date: 03/29/2020 Carotid Arterial Duplex Study Indications:  Syncope and Weakness. Risk Factors: Hypertension, hyperlipidemia. Performing Technologist: Griffin Basil RCT RDMS  Examination Guidelines: A complete evaluation includes B-mode imaging, spectral Doppler,  color Doppler, and power Doppler as needed of all accessible portions of each vessel. Bilateral testing is considered an integral part of a complete examination. Limited examinations for reoccurring indications may be performed as noted.  Right Carotid Findings: +----------+--------+--------+--------+------------------+------------------+           PSV cm/sEDV cm/sStenosisPlaque DescriptionComments           +----------+--------+--------+--------+------------------+------------------+ CCA Prox  77      13                                                   +----------+--------+--------+--------+------------------+------------------+ CCA Distal71      10                                intimal thickening +----------+--------+--------+--------+------------------+------------------+ ICA Prox  86      15      1-39%                     intimal thickening +----------+--------+--------+--------+------------------+------------------+ ICA  Distal61      15                                                   +----------+--------+--------+--------+------------------+------------------+ ECA       75      1                                                    +----------+--------+--------+--------+------------------+------------------+ +----------+--------+-------+--------+-------------------+           PSV cm/sEDV cmsDescribeArm Pressure (mmHG) +----------+--------+-------+--------+-------------------+ [id number]     1                                  +----------+--------+-------+--------+-------------------+ +---------+--------+--+--------+--+---------+ VertebralPSV cm/s43EDV cm/s12Antegrade +---------+--------+--+--------+--+---------+  Left Carotid Findings: +----------+--------+--------+--------+------------------+------------------+           PSV cm/sEDV cm/sStenosisPlaque DescriptionComments            +----------+--------+--------+--------+------------------+------------------+ CCA Prox  99      3                                                    +----------+--------+--------+--------+------------------+------------------+ CCA Distal79      16                                intimal thickening +----------+--------+--------+--------+------------------+------------------+ ICA Prox  72      18      1-39%                     intimal thickening +----------+--------+--------+--------+------------------+------------------+ ICA Distal96      25                                                   +----------+--------+--------+--------+------------------+------------------+ ECA       78      1                                                    +----------+--------+--------+--------+------------------+------------------+ +----------+--------+--------+--------+-------------------+           PSV cm/sEDV cm/sDescribeArm Pressure (mmHG) +----------+--------+--------+--------+-------------------+ Subclavian132                                         +----------+--------+--------+--------+-------------------+ +---------+--------+--+--------+--+---------+ VertebralPSV cm/s67EDV cm/s15Antegrade +---------+--------+--+--------+--+---------+   Summary: Right Carotid: Velocities in the right ICA are consistent with a 1-39% stenosis. Left Carotid: Velocities in the left ICA are consistent with a 1-39% stenosis. Vertebrals: Bilateral vertebral arteries demonstrate antegrade flow. *See table(s) above for measurements and observations.  Electronically signed by Antony Contras MD on  03/29/2020 at 5:05:55 PM.    Final      Labs:   Basic Metabolic Panel: Recent Labs  Lab 03/26/20 1450 03/26/20 1450 03/28/20 1325 03/28/20 1325 03/29/20 0422 03/30/20 0343  NA 136  --  135  --  137 135  K 3.9   < > 3.6   < > 3.4* 3.3*  CL 103  --  97*  --  99 98  CO2 25  --  28  --  29 31   GLUCOSE 106*  --  107*  --  107* 107*  BUN 32*  --  14  --  13 14  CREATININE 1.34*  --  1.02*  --  1.37* 1.20*  CALCIUM 8.9  --  8.9  --  9.2 9.0  MG  --   --  2.1  --   --   --    < > = values in this interval not displayed.   GFR Estimated Creatinine Clearance: 38.1 mL/min (A) (by C-G formula based on SCr of 1.2 mg/dL (H)). Liver Function Tests: Recent Labs  Lab 03/26/20 1450 03/28/20 1325 03/29/20 0422  AST 22 19 19   ALT 16 17 16   ALKPHOS 38 35* 36*  BILITOT 1.0 0.9 1.1  PROT 6.6 6.9 6.1*  ALBUMIN 3.7 3.8 3.3*   Recent Labs  Lab 03/28/20 1325  LIPASE 41   No results for input(s): AMMONIA in the last 168 hours. Coagulation profile No results for input(s): INR, PROTIME in the last 168 hours.  CBC: Recent Labs  Lab 03/26/20 1450 03/28/20 1325 03/29/20 0422  WBC 5.7 6.7 5.5  NEUTROABS 3.7 5.0  --   HGB 11.7* 12.5 12.3  HCT 36.0 37.8 37.0  MCV 88.9 87.5 87.9  PLT 214 220 202   Cardiac Enzymes: No results for input(s): CKTOTAL, CKMB, CKMBINDEX, TROPONINI in the last 168 hours. BNP: Invalid input(s): POCBNP CBG: Recent Labs  Lab 03/26/20 1451 03/29/20 0645 03/30/20 0513 03/31/20 0413  GLUCAP 110* 94 98 81   D-Dimer No results for input(s): DDIMER in the last 72 hours. Hgb A1c No results for input(s): HGBA1C in the last 72 hours. Lipid Profile No results for input(s): CHOL, HDL, LDLCALC, TRIG, CHOLHDL, LDLDIRECT in the last 72 hours. Thyroid function studies Recent Labs    03/28/20 1448  TSH 1.162   Anemia work up No results for input(s): VITAMINB12, FOLATE, FERRITIN, TIBC, IRON, RETICCTPCT in the last 72 hours. Microbiology Recent Results (from the past 240 hour(s))  SARS Coronavirus 2 by RT PCR (hospital order, performed in [facility] hospital lab) Nasopharyngeal Nasopharyngeal Swab     Status: None   Collection Time: 03/28/20  1:25 PM   Specimen: Nasopharyngeal Swab  Result Value Ref Range Status   SARS Coronavirus 2 NEGATIVE NEGATIVE  Final    Comment: (NOTE) SARS-CoV-2 target nucleic acids are NOT DETECTED.  The SARS-CoV-2 RNA is generally detectable in upper and lower respiratory specimens during the acute phase of infection. The lowest concentration of SARS-CoV-2 viral copies this assay can detect is 250 copies / mL. A negative result does not preclude SARS-CoV-2 infection and should not be used as the sole basis for treatment or other patient management decisions.  A negative result may occur with improper specimen collection / handling, submission of specimen other than nasopharyngeal swab, presence of viral mutation(s) within the areas targeted by this assay, and inadequate number of viral copies (<250 copies / mL). A negative result must be combined with clinical observations,  patient history, and epidemiological information.  Fact Sheet for Patients:   StrictlyIdeas.no  Fact Sheet for Healthcare Providers: BankingDealers.co.za  This test is not yet approved or  cleared by the [place] FDA and has been authorized for detection and/or diagnosis of SARS-CoV-2 by FDA under an Emergency Use Authorization (EUA).  This EUA will remain in effect (meaning this test can be used) for the duration of the COVID-19 declaration under Section 564(b)(1) of the Act, 21 U.S.C. section 360bbb-3(b)(1), unless the authorization is terminated or revoked sooner.  Performed at [facility], [address] [place], [name] [address]   MRSA PCR Screening     Status: None   Collection Time: 03/28/20  8:35 PM   Specimen: Nasal Mucosa; Nasopharyngeal  Result Value Ref Range Status   MRSA by PCR NEGATIVE NEGATIVE Final    Comment:        The GeneXpert MRSA Assay (FDA approved for NASAL specimens only), is one component of a comprehensive MRSA colonization surveillance program. It is not intended to diagnose MRSA infection nor to guide or monitor treatment  for MRSA infections. Performed at [facility], [place] [address], [place], [place] [address]   Surgical PCR screen     Status: Abnormal   Collection Time: 03/30/20 11:36 AM   Specimen: Nasal Mucosa; Nasal Swab  Result Value Ref Range Status   MRSA, PCR NEGATIVE NEGATIVE Final   Staphylococcus aureus POSITIVE (A) NEGATIVE Final    Comment: (NOTE) The Xpert SA Assay (FDA approved for NASAL specimens in patients 14 years of age and older), is one component of a comprehensive surveillance program. It is not intended to diagnose infection nor to guide or monitor treatment. Performed at [facility], [place] [address], [place], [place] [address]      Discharge Instructions:   Discharge Instructions    Call MD for:  severe uncontrolled pain   Complete by: As directed    Call MD for:  temperature >100.4   Complete by: As directed    Diet - low sodium heart healthy   Complete by: As directed    Discharge instructions   Complete by: As directed    Follow up with your primary care physician in one week. Follow up with cardiology as has been scheduled. Dose of HCTZ has been decreased. Take post pace maker precautions. Seek medical attention for worsening symptoms   Increase activity slowly   Complete by: As directed      Allergies as of 03/31/2020   No Known Allergies     Medication List    STOP taking these medications   cephALEXin 500 MG capsule Commonly known as: KEFLEX     TAKE these medications   acyclovir ointment 5 % Commonly known as: ZOVIRAX Apply 1 application topically daily as needed (for cold sores).   amLODipine 5 MG tablet Commonly known as: NORVASC Take 1 tablet (5 mg total) by mouth daily.   aspirin EC 81 MG tablet Take 81 mg by mouth daily.   baclofen 10 MG tablet Commonly known as: LIORESAL Take 1 tablet (10 mg total) by mouth 3 (three) times daily as needed (muscle spasms).   fluticasone 110 MCG/ACT inhaler Commonly known as: FLOVENT  HFA Inhale 2 puffs into the lungs 2 (two) times daily as needed (for shortness of breath or wheezing).   gabapentin 100 MG capsule Commonly known as: NEURONTIN Take 100 mg by mouth 2 (two) times daily.   hydrochlorothiazide  12.5 MG tablet Commonly known as: HYDRODIURIL Take 1 tablet (12.5 mg total) by mouth daily. What changed:   medication strength  how much to take   HYDROcodone-acetaminophen 5-325 MG tablet Commonly known as: NORCO/VICODIN Take 1 tablet by mouth every 6 (six) hours as needed for moderate pain.   hydroxychloroquine 200 MG tablet Commonly known as: PLAQUENIL Take 200 mg by mouth 2 (two) times daily.   ipratropium 0.03 % nasal spray Commonly known as: ATROVENT Place 2 sprays into both nostrils daily as needed for rhinitis (severe nasal drip).   isosorbide mononitrate 30 MG 24 hr tablet Commonly known as: IMDUR Take 1 tablet (30 mg total) by mouth daily.   levothyroxine 112 MCG tablet Commonly known as: SYNTHROID TAKE 1 TABLET BY MOUTH  DAILY BEFORE BREAKFAST What changed: See the new instructions.   losartan 100 MG tablet Commonly known as: COZAAR Take 1 tablet (100 mg total) by mouth at bedtime.   meclizine 25 MG tablet Commonly known as: ANTIVERT Take 1 tablet (25 mg total) by mouth 3 (three) times daily as needed for dizziness.   meloxicam 15 MG tablet Commonly known as: MOBIC Take 15 mg by mouth daily as needed for pain.   metoprolol tartrate 50 MG tablet Commonly known as: LOPRESSOR Take 75 mg by mouth daily.   omeprazole 40 MG capsule Commonly known as: PRILOSEC TAKE 1 CAPSULE BY MOUTH IN  THE MORNING AND MAY REPEAT  DOSE IN THE EVENING AS  NEEDED FOR ACID REFLUX /  INDIGESTION What changed: See the new instructions.   ondansetron 4 MG tablet Commonly known as: Zofran Take 1 tablet (4 mg total) by mouth every 8 (eight) hours as needed for nausea or vomiting.   pravastatin 80 MG tablet Commonly known as: PRAVACHOL TAKE 1 TABLET BY  MOUTH AT  BEDTIME What changed: when to take this   sennosides-docusate sodium 8.6-50 MG tablet Commonly known as: SENOKOT-S Take 2 tablets by mouth daily. What changed:   when to take this  reasons to take this   triamcinolone 55 MCG/ACT Aero nasal inhaler Commonly known as: Nasacort Allergy 24HR Place 2 sprays into the nose daily. What changed:   when to take this  reasons to take this   Visine 0.05 % ophthalmic solution Generic drug: tetrahydrozoline Place 2 drops into both eyes daily as needed (for dry eyes).       Follow-up Information    [name], DO. Schedule an appointment as soon as possible for a visit in 1 week(s).   Specialty: Osteopathic Medicine Why: regular followup Contact information: [address] [place] [address] [phone]                Time coordinating discharge: 39 minutes  Signed:  Salil Raineri  [facility] 03/31/2020, 11:24 AM

## 2020-03-31 NOTE — Discharge Instructions (Signed)
After Your Pacemaker    You have a [employer] Pacemaker  ACTIVITY   Do not lift, push, pull, or carry anything over 8 pounds with the affected arm until 6 weeks (Wednesday May 12, 2020 ) after your procedure.    Do NOT DRIVE until you have been seen for your wound check, or as long as instructed by your healthcare provider.    Ask your healthcare provider when you can go back to work   INCISION/Dressing  If you are on a blood thinner such as Coumadin, Xarelto, Eliquis, Plavix, or Pradaxa please confirm with your provider when this should be resumed.    Monitor your Pacemaker site for redness, swelling, and drainage. Call the device clinic at [phone] if you experience these symptoms or fever/chills.   If your incision is sealed with Steri-strips or staples, you may shower 10 days after your procedure or when told by your provider. Do not remove the steri-strips or let the shower hit directly on your site. You may wash around your site with soap and water.     Avoid lotions, ointments, or perfumes over your incision until it is well-healed.   You may use a hot tub or a pool AFTER your wound check appointment if the incision is completely closed.   PAcemaker Alerts:  Some alerts are vibratory and others beep. These are NOT emergencies. Please call our office to let us know. If this occurs at night or on weekends, it can wait until the next business day. Send a remote transmission.   If your device is capable of reading fluid status (for heart failure), you will be offered monthly monitoring to review this with you.   DEVICE MANAGEMENT  Remote monitoring is used to monitor your pacemaker from home. This monitoring is scheduled every 91 days by our office. It allows [place] to keep an eye on the functioning of your device to ensure it is working properly. You will routinely see your Electrophysiologist annually (more often if necessary).    You should receive your ID card  for your new device in 4-8 weeks. Keep this card with you at all times once received. Consider wearing a medical alert bracelet or necklace.   Your Pacemaker may be MRI compatible. This will be discussed at your next office visit/wound check.  You should avoid contact with strong electric or magnetic fields.    Do not use amateur (ham) radio equipment or electric (arc) welding torches. MP3 player headphones with magnets should not be used. Some devices are safe to use if held at least 12 inches (30 cm) from your Pacemaker. These include power tools, lawn mowers, and speakers. If you are unsure if something is safe to use, ask your health care provider.   When using your cell phone, hold it to the ear that is on the opposite side from the Pacemaker. Do not leave your cell phone in a pocket over the Pacemaker.   You may safely use electric blankets, heating pads, computers, and microwave ovens.  Call the office right away if:  You have chest pain.  You feel more short of breath than you have felt before.  You feel more light-headed than you have felt before.  Your incision starts to open up.  This information is not intended to replace advice given to you by your health care provider. Make sure you discuss any questions you have with your health care provider.

## 2020-03-31 NOTE — Evaluation (Signed)
Occupational Therapy Evaluation Patient Details Name: [name] MRN: [id number] DOB: 1934-08-21 Today's Date: 03/31/2020    History of Present Illness 85 yr old with PAF transferred for dizziness and syncopal episodes.  Patient has history of A. fib, Mnire's, hypertension who presents to the ED after multiple syncopal episodes.  She states that she has been having episodes of syncope since last Friday.  Episodes last about 20 to 30 seconds with loss of consciousness and comes back quickly.  She feels weak following this.  Her daughter as visit witnessed these episodes and states that there was no seizures or any postictal state.  She had an episode while in the ER at [facility] where it was noted that she had sinus pause with extreme bradycardia.   Clinical Impression   Patient presents with the above diagnosis.  She is presenting at or near baseline for ADL's and basic functional mobility in the room. She was independent with all care and mobility prior level.  Patient able to get out of bed, toilet herself and perform light grooming standing at the sink with no cueing or assist.  Patient demonstrated good understanding of all precautions to AROM of left shoulder.  Sling education provided, but MD stated she does not need to wear.  MD advised home today after antibiotics.  No further OT in the acute setting, and no need for OT follow up in the home.  She will have 24 hour assist as needed at home via family.  BP higher this am than yesterday, 189/88.  Monitored during treatment and patient stating she has not had am meds.      Follow Up Recommendations  No OT follow up    Equipment Recommendations  Other (comment) (has needed DME)    Recommendations for Other Services       Precautions / Restrictions Precautions Precautions: Fall Precaution Comments: Pacemaker precautions to LUE Restrictions Weight Bearing Restrictions: Yes LUE Weight Bearing: Weight bearing as tolerated Other  Position/Activity Restrictions: discussed no forecful pushing and pulling.  no lifting over 5 lbs.      Mobility Bed Mobility Overal bed mobility: Independent                Transfers Overall transfer level: Independent               General transfer comment: educated patient to stand for a few seconds to ensure she is not dizzy    Balance Overall balance assessment: Independent                                         ADL either performed or assessed with clinical judgement   ADL Overall ADL's : At baseline                                       General ADL Comments: general Dist supervision for a few days for ADL and light home management/meal prep.     Vision Baseline Vision/History: Wears glasses Wears Glasses: At all times Patient Visual Report: No change from baseline Vision Assessment?: No apparent visual deficits     Perception     Praxis      Pertinent Vitals/Pain Pain Assessment: 0-10 Pain Score: 2  Pain Descriptors / Indicators: Sore Pain Intervention(s): Monitored during session  Hand Dominance Right   Extremity/Trunk Assessment Upper Extremity Assessment Upper Extremity Assessment: LUE deficits/detail LUE Deficits / Details: restricted AROM to LUE s/p procedure LUE Sensation: WNL LUE Coordination: WNL   Lower Extremity Assessment Lower Extremity Assessment: Defer to PT evaluation   Cervical / Trunk Assessment Cervical / Trunk Assessment: Normal   Communication Communication Communication: No difficulties   Cognition Arousal/Alertness: Awake/alert Behavior During Therapy: WFL for tasks assessed/performed Overall Cognitive Status: Within Functional Limits for tasks assessed                                                      Home Living Family/patient expects to be discharged to:: Private residence Living Arrangements: Children Available Help at Discharge:  Family Type of Home: House Home Access: Stairs to enter Technical brewer of Steps: 1 Entrance Stairs-Rails: None Home Layout: Two level;Able to live on main level with bedroom/bathroom   Alternate Level Stairs-Rails: Left Bathroom Shower/Tub: Occupational psychologist: Standard     Home Equipment: Environmental consultant - 4 wheels;Shower seat;Grab bars - tub/shower;Grab bars - toilet          Prior Functioning/Environment Level of Independence: Independent                 OT Problem List: Decreased activity tolerance      OT Treatment/Interventions:      OT Goals(Current goals can be found in the care plan section) Acute Rehab OT Goals Patient Stated Goal: get back into my routine Time For Goal Achievement: 04/02/20 Potential to Achieve Goals: Good  OT Frequency:     Barriers to D/C:  none noted          Co-evaluation              AM-PAC OT "6 Clicks" Daily Activity     Outcome Measure Help from another person eating meals?: None Help from another person taking care of personal grooming?: None Help from another person toileting, which includes using toliet, bedpan, or urinal?: None Help from another person bathing (including washing, rinsing, drying)?: None Help from another person to put on and taking off regular upper body clothing?: None Help from another person to put on and taking off regular lower body clothing?: None 6 Click Score: 24   End of Session    Activity Tolerance: Patient tolerated treatment well Patient left: in bed;with call bell/phone within reach;with family/visitor present  OT Visit Diagnosis: Unsteadiness on feet (R26.81)                Time: 0109-3235 OT Time Calculation (min): 28 min Charges:  OT General Charges $OT Visit: 1 Visit OT Evaluation $OT Eval Low Complexity: 1 Low OT Treatments $Self Care/Home Management : 8-22 mins  03/31/2020  Rich, OTR/L  Acute Rehabilitation Services  Office:   [phone]   Metta Clines 03/31/2020, 9:09 AM

## 2020-03-31 NOTE — Telephone Encounter (Signed)
Patients daughter advised she tried to set up patients home monitor, she called [employer] and was told home remote monitor was not set up. Patient is not in [place] either. Advised daughter I would reach out to someone in the office to see if I can get clarification. Agreeable to plan.Advised her we will call her back today or tomorrow.

## 2020-03-31 NOTE — Progress Notes (Signed)
D/C instructions given and reviewed. No questions asked but encouraged to call with any concerns. Tele and IV removed, tolerated well. 

## 2020-03-31 NOTE — Telephone Encounter (Signed)
New Message   Pt daughter called in stated that they called [facility] and they told her that her monitor had not been registered yet     [name]

## 2020-04-01 ENCOUNTER — Telehealth: Payer: Self-pay

## 2020-04-01 MED FILL — Lidocaine HCl Local Inj 1%: INTRAMUSCULAR | Qty: 60 | Status: AC

## 2020-04-01 NOTE — Telephone Encounter (Signed)
Remote transmission received 04/01/20. Called patient to update. Normal remote transmission. Advised patient to call device clinic if we can assist her in any way. Patient agreeable and thankful for the call.

## 2020-04-04 ENCOUNTER — Other Ambulatory Visit: Payer: Self-pay | Admitting: Osteopathic Medicine

## 2020-04-07 ENCOUNTER — Encounter: Payer: Self-pay | Admitting: Medical-Surgical

## 2020-04-07 ENCOUNTER — Other Ambulatory Visit: Payer: Self-pay

## 2020-04-07 ENCOUNTER — Ambulatory Visit (INDEPENDENT_AMBULATORY_CARE_PROVIDER_SITE_OTHER): Payer: Medicare Other | Admitting: Medical-Surgical

## 2020-04-07 VITALS — BP 121/73 | HR 99 | Temp 97.9°F | Ht 65.0 in | Wt 187.2 lb

## 2020-04-07 DIAGNOSIS — Z950 Presence of cardiac pacemaker: Secondary | ICD-10-CM | POA: Diagnosis not present

## 2020-04-07 DIAGNOSIS — I459 Conduction disorder, unspecified: Secondary | ICD-10-CM | POA: Diagnosis not present

## 2020-04-07 DIAGNOSIS — R300 Dysuria: Secondary | ICD-10-CM

## 2020-04-07 DIAGNOSIS — E876 Hypokalemia: Secondary | ICD-10-CM | POA: Diagnosis not present

## 2020-04-07 DIAGNOSIS — N289 Disorder of kidney and ureter, unspecified: Secondary | ICD-10-CM

## 2020-04-07 LAB — POCT URINALYSIS DIPSTICK
Bilirubin, UA: NEGATIVE
Blood, UA: NEGATIVE
Glucose, UA: NEGATIVE
Leukocytes, UA: NEGATIVE
Nitrite, UA: NEGATIVE
Odor: NEGATIVE
Protein, UA: POSITIVE — AB
Spec Grav, UA: >=1.03 — AB (ref 1.010–1.025)
Urobilinogen, UA: 0.2 E.U./dL
pH, UA: 6 (ref 5.0–8.0)

## 2020-04-07 NOTE — Progress Notes (Signed)
Subjective:    CC: hospital follow up  HPI: Pleasant 85 year old female accompanied by her daughter presenting today for hospital follow-up.  She was seen in the emergency room on 9/17 for a syncopal episode but was sent home after they did not find any acute concerns.  She was taken back to the emergency room on 9/19 for another episode of syncope and collapse.  At that time they discovered she was in heart block and she was admitted for pacemaker implantation.  She had the pacemaker implanted on 9/21 and discharged from the hospital on 9/22.  Since then she has been doing well overall.  They did reduce her dose of hydrochlorothiazide and stop her metoprolol while she was in the hospital.  She was advised to discuss resuming the metoprolol and increasing her HCTZ when she sees her cardiologist next.  Discharge recommendations included rechecking her CBC, BMP, and magnesium as she did have some electrolyte abnormalities and abnormal kidney function the morning of discharge.  She reports that she did have an episode yesterday morning at 10:45 AM where she had a "flash" followed by a period of increased heart rate that lasted for a little over an hour.  Notes that her heart rate was regular but was faster with Martin Majestic.  She is having more PVCs than usual as she is off of her Lopressor.  They have an appointment next week for a wound check followed by another appointment at the end of October with her electrophysiologist had denies fever, chills, chest pain, shortness of breath, headaches, dizziness/lightheadedness, and further syncopal episodes.  Does endorse some mild chest tightness but this has been ongoing for a while.  Would like to have her urine rechecked.  At her ED visit on 9/17 she was told she had a urinary tract infection and was provided with a dose of IV antibiotics followed by oral antibiotics.  2 days later at her next ED visit, her urine was rechecked and was clear of any infection.  Would  like to make sure that her urine is also clear today.  Denies urinary symptoms.  I reviewed the past medical history, family history, social history, surgical history, and allergies today and no changes were needed.  Please see the problem list section below in epic for further details.  Past Medical History: Past Medical History:  Diagnosis Date  . Arthritis   . Asthma   . Atrial fibrillation (HCC)    paroxysmal , addressed  with metoprolol   . Chronic sinusitis   . Complication of anesthesia   . Dyspnea    sob on exertion, reports at her pre-op today 09-13-2018 that her cardiologist has planned for her to do a ECHO for evaluation   . Family history of adverse reaction to anesthesia    sister is hard to wake up from Anesthesia  . Follicular lymphoma ([place])    received Chemotherapy (2 years ago)  and radiation (last 12/2017)  . Hyperlipidemia   . Hypertension   . Hypothyroidism   . Meniere's disease   . PONV (postoperative nausea and vomiting)   . Primary localized osteoarthritis of left knee 09/24/2018  . Primary localized osteoarthritis of right knee 06/18/2018  . Reflux    Past Surgical History: Past Surgical History:  Procedure Laterality Date  . ABDOMINAL HYSTERECTOMY    . BONE MARROW BIOPSY  06/16/2014   at [place]     bilateral  . CHOLECYSTECTOMY    . KNEE  ARTHROSCOPY    . PACEMAKER IMPLANT N/A 03/30/2020   Procedure: PACEMAKER IMPLANT;  Surgeon: Thompson Grayer, MD;  Location: [place] [facility];  Service: Cardiovascular;  Laterality: N/A;  . PARTIAL KNEE ARTHROPLASTY Right 06/18/2018   Procedure: UNICOMPARTMENTAL KNEE;  Surgeon: Marchia Bond, MD;  Location: [facility];  Service: Orthopedics;  Laterality: Right;  . PARTIAL KNEE ARTHROPLASTY Left 09/24/2018   Procedure: UNICOMPARTMENTAL KNEE;  Surgeon: Marchia Bond, MD;  Location: [facility];  Service: Orthopedics;  Laterality: Left;   Social History: Social History   Socioeconomic History  .  Marital status: Widowed    Spouse name: Not on file  . Number of children: 1  . Years of education: 37  . Highest education level: 12th grade  Occupational History  . Occupation: Theatre manager    Comment: retired  Tobacco Use  . Smoking status: Never Smoker  . Smokeless tobacco: Never Used  Vaping Use  . Vaping Use: Never used  Substance and Sexual Activity  . Alcohol use: No  . Drug use: No  . Sexual activity: Not Currently    Partners: Male  Other Topics Concern  . Not on file  Social History Narrative  . Not on file   Social Determinants of Health   Financial Resource Strain:   . Difficulty of Paying Living Expenses: Not on file  Food Insecurity:   . Worried About Charity fundraiser in the Last Year: Not on file  . Ran Out of Food in the Last Year: Not on file  Transportation Needs:   . Lack of Transportation (Medical): Not on file  . Lack of Transportation (Non-Medical): Not on file  Physical Activity:   . Days of Exercise per Week: Not on file  . Minutes of Exercise per Session: Not on file  Stress:   . Feeling of Stress : Not on file  Social Connections:   . Frequency of Communication with Friends and Family: Not on file  . Frequency of Social Gatherings with Friends and Family: Not on file  . Attends Religious Services: Not on file  . Active Member of Clubs or Organizations: Not on file  . Attends Archivist Meetings: Not on file  . Marital Status: Not on file   Family History: Family History  Problem Relation Age of Onset  . Heart disease Other   . Skin cancer Mother   . Stroke Mother   . Prostate cancer Father   . High blood pressure Father   . Heart attack Father    Allergies: No Known Allergies Medications: See med rec.  Review of Systems: See HPI for pertinent positives and negatives.   Objective:    General: Well Developed, well nourished, and in no acute distress.  Neuro: Alert and oriented x3.  HEENT: Normocephalic,  atraumatic.  Skin: Warm and dry.  Steri-Strips dry and intact over her left chest pacemaker. Cardiac: Regular rate and rhythm, no murmurs rubs or gallops, no lower extremity edema.  Respiratory: Clear to auscultation bilaterally. Not using accessory muscles, speaking in full sentences.  Impression and Recommendations:    1. Heart block/pacemaker Pacemaker in place and functioning well.  Follow-up with cardiology as instructed.  No change in medications today.  Rechecking labs per hospital discharge instructions. - CBC - BASIC METABOLIC PANEL WITH GFR - Magnesium  2. Hypokalemia/abnormal kidney function Rechecking CBC, BMP, and magnesium today. - CBC - BASIC METABOLIC PANEL WITH GFR - Magnesium  3. Dysuria POCT UA positive for trace protein,  trace ketones.  Negative for blood, nitrites, and leukocytes.  Specific gravity is elevated.  We will go ahead and send for culture for confirmation of clearance. - POCT Urinalysis Dipstick - Urine Culture  Return in about 4 weeks (around 05/05/2020) for chronic disease follow up. ___________________________________________ Clearnce Sorrel, DNP, APRN, FNP-BC [facility] [place]

## 2020-04-08 LAB — BASIC METABOLIC PANEL WITH GFR
BUN/Creatinine Ratio: 13 (calc) (ref 6–22)
BUN: 16 mg/dL (ref 7–25)
CO2: 27 mmol/L (ref 20–32)
Calcium: 9.3 mg/dL (ref 8.6–10.4)
Chloride: 102 mmol/L (ref 98–110)
Creat: 1.22 mg/dL — ABNORMAL HIGH (ref 0.60–0.88)
GFR, Est African American: 47 mL/min/{1.73_m2} — ABNORMAL LOW (ref >=60)
GFR, Est Non African American: 41 mL/min/{1.73_m2} — ABNORMAL LOW (ref >=60)
Glucose, Bld: 97 mg/dL (ref 65–139)
Potassium: 4.3 mmol/L (ref 3.5–5.3)
Sodium: 138 mmol/L (ref 135–146)

## 2020-04-08 LAB — CBC
HCT: 38.7 % (ref 35.0–45.0)
Hemoglobin: 13 g/dL (ref 11.7–15.5)
MCH: 29.3 pg (ref 27.0–33.0)
MCHC: 33.6 g/dL (ref 32.0–36.0)
MCV: 87.4 fL (ref 80.0–100.0)
MPV: 10.2 fL (ref 7.5–12.5)
Platelets: 229 10*3/uL (ref 140–400)
RBC: 4.43 10*6/uL (ref 3.80–5.10)
RDW: 12.2 % (ref 11.0–15.0)
WBC: 6.5 10*3/uL (ref 3.8–10.8)

## 2020-04-08 LAB — MAGNESIUM: Magnesium: 2.3 mg/dL (ref 1.5–2.5)

## 2020-04-09 ENCOUNTER — Other Ambulatory Visit: Payer: Self-pay

## 2020-04-09 DIAGNOSIS — Z950 Presence of cardiac pacemaker: Secondary | ICD-10-CM

## 2020-04-09 LAB — URINE CULTURE
MICRO NUMBER:: 11013606
SPECIMEN QUALITY:: ADEQUATE

## 2020-04-09 MED ORDER — LOSARTAN POTASSIUM 100 MG PO TABS: 100.0000 mg | ORAL_TABLET | Freq: Every day | ORAL | 3 refills | Status: DC

## 2020-04-13 ENCOUNTER — Other Ambulatory Visit: Payer: Self-pay

## 2020-04-13 ENCOUNTER — Ambulatory Visit (INDEPENDENT_AMBULATORY_CARE_PROVIDER_SITE_OTHER): Payer: Medicare Other | Admitting: Emergency Medicine

## 2020-04-13 DIAGNOSIS — I459 Conduction disorder, unspecified: Secondary | ICD-10-CM | POA: Diagnosis not present

## 2020-04-14 ENCOUNTER — Telehealth: Payer: Self-pay

## 2020-04-14 LAB — CUP PACEART INCLINIC DEVICE CHECK
Battery Remaining Longevity: 164 mo
Battery Voltage: 3.23 V
Brady Statistic AP VP Percent: 0.48 %
Brady Statistic AP VS Percent: 1.42 %
Brady Statistic AS VP Percent: 27.35 %
Brady Statistic AS VS Percent: 70.76 %
Brady Statistic RA Percent Paced: 2.4 %
Brady Statistic RV Percent Paced: 27.83 %
Date Time Interrogation Session: 20211005110700
Implantable Lead Implant Date: 20210921
Implantable Lead Implant Date: 20210921
Implantable Lead Location: 753859
Implantable Lead Location: 753860
Implantable Lead Model: 3830
Implantable Lead Model: 5076
Implantable Pulse Generator Implant Date: 20210921
Lead Channel Impedance Value: 304 Ohm
Lead Channel Impedance Value: 304 Ohm
Lead Channel Impedance Value: 380 Ohm
Lead Channel Impedance Value: 380 Ohm
Lead Channel Impedance Value: 399 Ohm
Lead Channel Impedance Value: 399 Ohm
Lead Channel Impedance Value: 551 Ohm
Lead Channel Impedance Value: 551 Ohm
Lead Channel Pacing Threshold Amplitude: 0.625 V
Lead Channel Pacing Threshold Amplitude: 0.875 V
Lead Channel Pacing Threshold Pulse Width: 0.4 ms
Lead Channel Pacing Threshold Pulse Width: 0.4 ms
Lead Channel Sensing Intrinsic Amplitude: 18.875 mV
Lead Channel Sensing Intrinsic Amplitude: 2.25 mV
Lead Channel Setting Pacing Amplitude: 3.5 V
Lead Channel Setting Pacing Amplitude: 3.5 V
Lead Channel Setting Pacing Pulse Width: 0.4 ms
Lead Channel Setting Sensing Sensitivity: 0.9 mV

## 2020-04-14 NOTE — Progress Notes (Signed)
Wound check appointment. Steri-strips removed, redness in area of skin where steri-strips were removed. Wound without redness or edema. Incision edges approximated, wound well healed. Normal device function. Thresholds, sensing, and impedances consistent with implant measurements. Device programmed at 3.5V/auto capture programmed on for extra safety margin until 3 month visit. Histogram distribution appropriate for patient and level of activity. No mode switches or high ventricular rates noted. Patient educated about wound care, arm mobility, lifting restrictions. ROV 07/13/19. Enrolled in remote follow up and next remote scheduled for 06/30/20.

## 2020-04-14 NOTE — Telephone Encounter (Signed)
The pt states since they took the bandage off she been having pain on her ppm site. I had the pt send a transmission to see if there is something going on pacemaker wise for her to hurt. Transmission received. I let her speak with device nurse Jenny Reichmann, rn.

## 2020-04-14 NOTE — Telephone Encounter (Signed)
Patient calling to report increased pain in area of the device in left chest that increases with movement of left arm. Patient reports increased pain after steri-strips were removed at visit 04/13/20/ {atient reports no edema, drainage, or change in temperature of her skin in area of device in left chest.She reports no fever or chills. She reports that she took Tylenol 100 mg and Gabepentin 100 mg last night for 8/10 intermittent sharp pain in device area radiating to left shoulder and upper arm. After medication she reports pain decreased to 6/10. Recommended that patient continue to take Tylenol  every 6-8 hours for the pain and apply an ice pack to area for pain relief. Patient to call office if pain increases or she develops a fever, chills , or  any edema, redness or drainage from wound site.Remote transmission sent and device function WNL , no episodes or events .

## 2020-04-23 ENCOUNTER — Other Ambulatory Visit: Payer: Self-pay | Admitting: Neurology

## 2020-04-23 NOTE — Telephone Encounter (Signed)
Received request for refill of Gabapentin Never prescribed by this office Last appt 04/07/2020 with Joy Please advise.

## 2020-04-26 MED ORDER — GABAPENTIN 100 MG PO CAPS
100.0000 mg | ORAL_CAPSULE | Freq: Two times a day (BID) | ORAL | 0 refills | Status: DC | PRN
Start: 2020-04-26 — End: 2020-06-28

## 2020-04-28 ENCOUNTER — Other Ambulatory Visit: Payer: Self-pay

## 2020-04-28 DIAGNOSIS — Z950 Presence of cardiac pacemaker: Secondary | ICD-10-CM

## 2020-04-28 MED ORDER — LOSARTAN POTASSIUM 100 MG PO TABS: 100.0000 mg | ORAL_TABLET | Freq: Every day | ORAL | 3 refills | Status: DC

## 2020-05-05 ENCOUNTER — Ambulatory Visit: Payer: Medicare Other | Admitting: Osteopathic Medicine

## 2020-05-11 IMAGING — DX CHEST - 2 VIEW
2 series · 2 of 2 positions shown · non-contrast
Comparison: 06/24/2013

CLINICAL DATA: Right posterior rib pain.

EXAM:
CHEST - 2 VIEW

[chest pa]
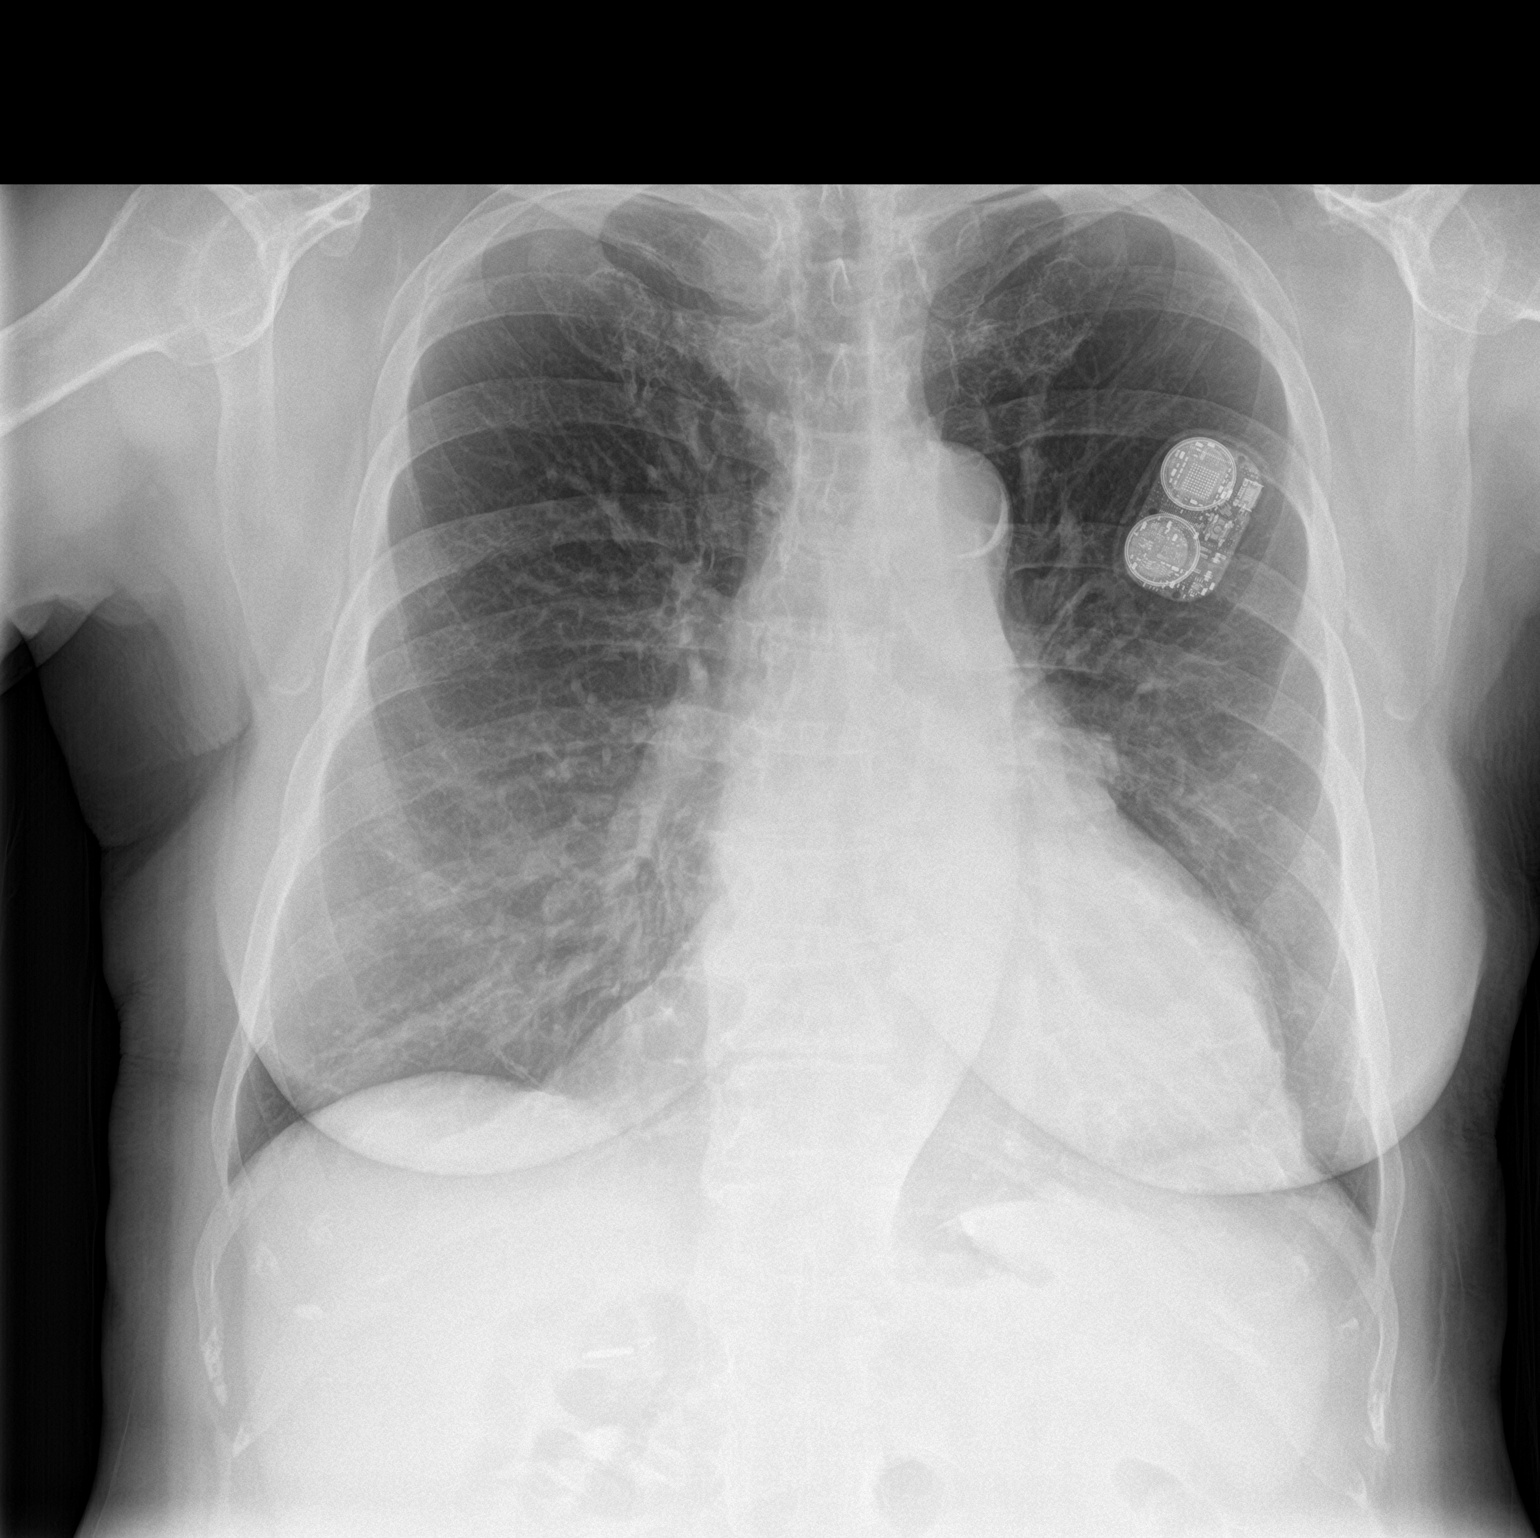

[chest lat]
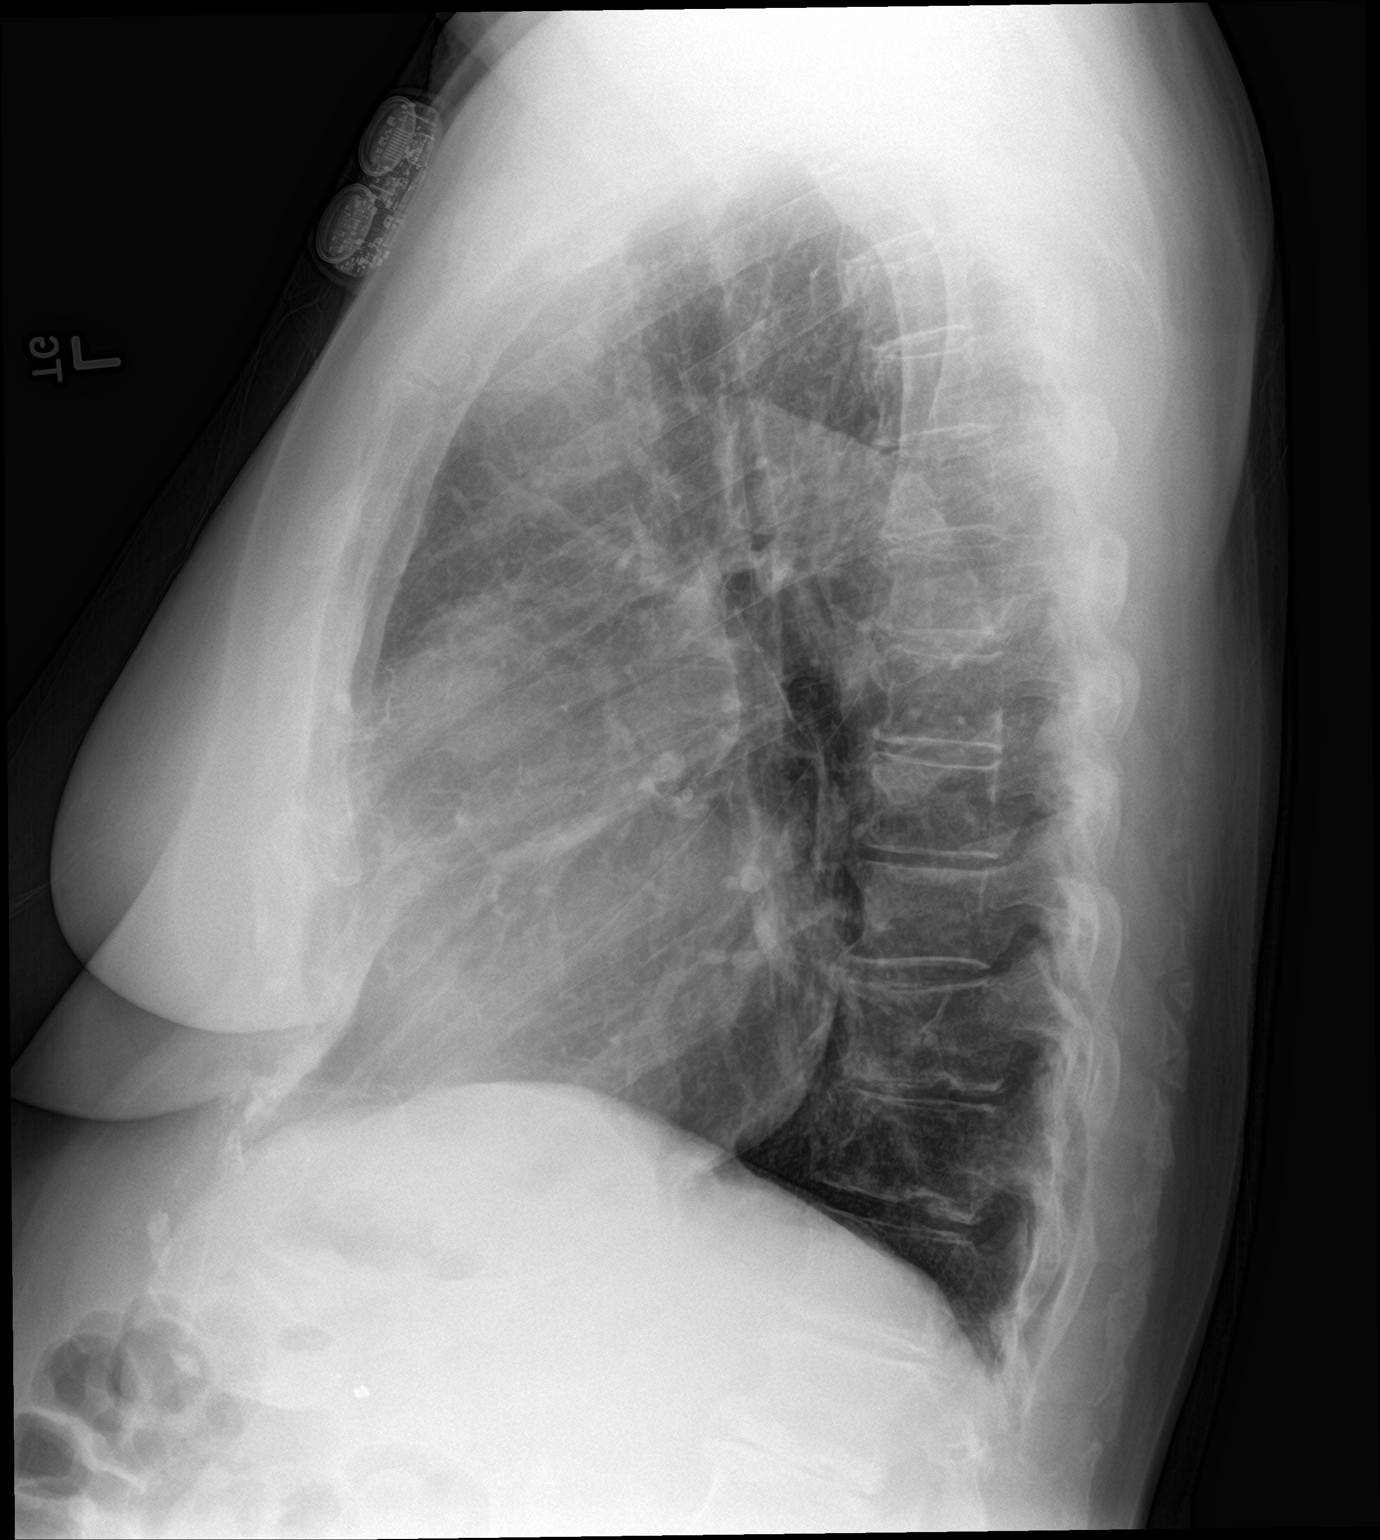

[2 of 2 positions shown; findings below may reference images not displayed]

FINDINGS: Heart size and pulmonary vascularity are. Aortic atherosclerosis.
Lungs are clear. No effusions. No significant bone abnormality.
IMPRESSION: No acute cardiopulmonary disease. Aortic atherosclerosis.

## 2020-05-12 ENCOUNTER — Other Ambulatory Visit: Payer: Self-pay

## 2020-05-12 MED ORDER — BACLOFEN 10 MG PO TABS
10.0000 mg | ORAL_TABLET | Freq: Three times a day (TID) | ORAL | 0 refills | Status: DC | PRN
Start: 2020-05-12 — End: 2020-05-21

## 2020-05-19 ENCOUNTER — Telehealth: Payer: Self-pay | Admitting: Internal Medicine

## 2020-05-19 NOTE — Telephone Encounter (Signed)
Spoke with pt, advised to unplu/ reset monitor by plugging back in.  If continues to blink, provided pt with tech suppport number.

## 2020-05-19 NOTE — Telephone Encounter (Signed)
  1. Has your device fired? No   2. Is you device beeping? No   3. Are you experiencing draining or swelling at device site? No   4. Are you calling to see if we received your device transmission? No   5. Have you passed out? No  Device is blinking orange, states it's normally green and not blinking.     Please route to [place]

## 2020-05-21 ENCOUNTER — Other Ambulatory Visit: Payer: Self-pay

## 2020-05-21 MED ORDER — BACLOFEN 10 MG PO TABS
10.0000 mg | ORAL_TABLET | Freq: Three times a day (TID) | ORAL | 0 refills | Status: DC | PRN
Start: 2020-05-21 — End: 2021-02-08

## 2020-05-26 DIAGNOSIS — Z45018 Encounter for adjustment and management of other part of cardiac pacemaker: Secondary | ICD-10-CM | POA: Diagnosis not present

## 2020-05-26 DIAGNOSIS — I480 Paroxysmal atrial fibrillation: Secondary | ICD-10-CM | POA: Diagnosis not present

## 2020-05-26 DIAGNOSIS — I454 Nonspecific intraventricular block: Secondary | ICD-10-CM | POA: Diagnosis not present

## 2020-05-26 DIAGNOSIS — I4430 Unspecified atrioventricular block: Secondary | ICD-10-CM | POA: Diagnosis not present

## 2020-05-26 DIAGNOSIS — I493 Ventricular premature depolarization: Secondary | ICD-10-CM | POA: Diagnosis not present

## 2020-05-26 DIAGNOSIS — I491 Atrial premature depolarization: Secondary | ICD-10-CM | POA: Diagnosis not present

## 2020-05-26 DIAGNOSIS — Z950 Presence of cardiac pacemaker: Secondary | ICD-10-CM | POA: Diagnosis not present

## 2020-06-14 DIAGNOSIS — C8232 Follicular lymphoma grade IIIa, intrathoracic lymph nodes: Secondary | ICD-10-CM | POA: Diagnosis not present

## 2020-06-15 DIAGNOSIS — E785 Hyperlipidemia, unspecified: Secondary | ICD-10-CM | POA: Diagnosis not present

## 2020-06-15 DIAGNOSIS — Z9889 Other specified postprocedural states: Secondary | ICD-10-CM | POA: Diagnosis not present

## 2020-06-15 DIAGNOSIS — K224 Dyskinesia of esophagus: Secondary | ICD-10-CM | POA: Diagnosis not present

## 2020-06-15 DIAGNOSIS — I10 Essential (primary) hypertension: Secondary | ICD-10-CM | POA: Diagnosis not present

## 2020-06-15 DIAGNOSIS — Z8719 Personal history of other diseases of the digestive system: Secondary | ICD-10-CM | POA: Diagnosis not present

## 2020-06-15 DIAGNOSIS — J329 Chronic sinusitis, unspecified: Secondary | ICD-10-CM | POA: Diagnosis not present

## 2020-06-15 DIAGNOSIS — Z8572 Personal history of non-Hodgkin lymphomas: Secondary | ICD-10-CM | POA: Diagnosis not present

## 2020-06-15 DIAGNOSIS — Z79899 Other long term (current) drug therapy: Secondary | ICD-10-CM | POA: Diagnosis not present

## 2020-06-15 DIAGNOSIS — R1319 Other dysphagia: Secondary | ICD-10-CM | POA: Diagnosis not present

## 2020-06-15 DIAGNOSIS — Z950 Presence of cardiac pacemaker: Secondary | ICD-10-CM | POA: Diagnosis not present

## 2020-06-15 DIAGNOSIS — E039 Hypothyroidism, unspecified: Secondary | ICD-10-CM | POA: Diagnosis not present

## 2020-06-15 DIAGNOSIS — Z923 Personal history of irradiation: Secondary | ICD-10-CM | POA: Diagnosis not present

## 2020-06-24 DIAGNOSIS — I480 Paroxysmal atrial fibrillation: Secondary | ICD-10-CM | POA: Diagnosis not present

## 2020-06-24 DIAGNOSIS — E079 Disorder of thyroid, unspecified: Secondary | ICD-10-CM | POA: Diagnosis not present

## 2020-06-24 DIAGNOSIS — C8232 Follicular lymphoma grade IIIa, intrathoracic lymph nodes: Secondary | ICD-10-CM | POA: Diagnosis not present

## 2020-06-24 DIAGNOSIS — R1310 Dysphagia, unspecified: Secondary | ICD-10-CM | POA: Diagnosis not present

## 2020-06-24 DIAGNOSIS — I10 Essential (primary) hypertension: Secondary | ICD-10-CM | POA: Diagnosis not present

## 2020-06-27 ENCOUNTER — Other Ambulatory Visit: Payer: Self-pay | Admitting: Osteopathic Medicine

## 2020-06-29 ENCOUNTER — Telehealth: Payer: Self-pay | Admitting: General Practice

## 2020-06-29 NOTE — Telephone Encounter (Signed)
Called patient to schedule the appointment for medicare wellness exam but patient stated she did not want to have those anymore. She only wanted to do the physical's with the doctor.

## 2020-06-30 DIAGNOSIS — I480 Paroxysmal atrial fibrillation: Secondary | ICD-10-CM | POA: Diagnosis not present

## 2020-06-30 DIAGNOSIS — Z45018 Encounter for adjustment and management of other part of cardiac pacemaker: Secondary | ICD-10-CM | POA: Diagnosis not present

## 2020-07-08 DIAGNOSIS — Z79899 Other long term (current) drug therapy: Secondary | ICD-10-CM | POA: Diagnosis not present

## 2020-07-08 DIAGNOSIS — H26492 Other secondary cataract, left eye: Secondary | ICD-10-CM | POA: Diagnosis not present

## 2020-07-08 DIAGNOSIS — H35363 Drusen (degenerative) of macula, bilateral: Secondary | ICD-10-CM | POA: Diagnosis not present

## 2020-07-12 ENCOUNTER — Ambulatory Visit (INDEPENDENT_AMBULATORY_CARE_PROVIDER_SITE_OTHER): Payer: Medicare Other | Admitting: Internal Medicine

## 2020-07-12 ENCOUNTER — Encounter: Payer: Self-pay | Admitting: Internal Medicine

## 2020-07-12 ENCOUNTER — Other Ambulatory Visit: Payer: Self-pay

## 2020-07-12 VITALS — BP 162/64 | HR 80 | Ht 65.0 in | Wt 187.8 lb

## 2020-07-12 DIAGNOSIS — I459 Conduction disorder, unspecified: Secondary | ICD-10-CM | POA: Diagnosis not present

## 2020-07-12 DIAGNOSIS — R55 Syncope and collapse: Secondary | ICD-10-CM | POA: Diagnosis not present

## 2020-07-12 DIAGNOSIS — I493 Ventricular premature depolarization: Secondary | ICD-10-CM

## 2020-07-12 DIAGNOSIS — I441 Atrioventricular block, second degree: Secondary | ICD-10-CM | POA: Diagnosis not present

## 2020-07-12 NOTE — Patient Instructions (Signed)
Medication Instructions:  Your physician recommends that you continue on your current medications as directed. Please refer to the Current Medication list given to you today.  *If you need a refill on your cardiac medications before your next appointment, please call your pharmacy*  Lab Work: None ordered.  If you have labs (blood work) drawn today and your tests are completely normal, you will receive your results only by: . MyChart Message (if you have MyChart) OR . A paper copy in the mail If you have any lab test that is abnormal or we need to change your treatment, we will call you to review the results.  Testing/Procedures: None ordered.  Follow-Up: At [facility], you and your health needs are our priority.  As part of our continuing mission to provide you with exceptional heart care, we have created designated Provider Care Teams.  These Care Teams include your primary Cardiologist (physician) and Advanced Practice Providers (APPs -  Physician Assistants and Nurse Practitioners) who all work together to provide you with the care you need, when you need it.  We recommend signing up for the patient portal called "MyChart".  Sign up information is provided on this After Visit Summary.  MyChart is used to connect with patients for Virtual Visits (Telemedicine).  Patients are able to view lab/test results, encounter notes, upcoming appointments, etc.  Non-urgent messages can be sent to your provider as well.   To learn more about what you can do with MyChart, go to [email or web address].    Your next appointment:   Your physician wants you to follow-up in: As needed.    Other Instructions:  

## 2020-07-12 NOTE — Progress Notes (Signed)
PCP: [name], DO   Primary EP:  Dr Gaylord Shih is a 86 y.o. female who presents today for routine electrophysiology followup.  Since her PPM implant, the patient reports doing very well. She has rare palpitations.  Today, she denies symptoms of chest pain, shortness of breath,  lower extremity edema, dizziness, presyncope, or syncope.  The patient is otherwise without complaint today.   Past Medical History:  Diagnosis Date  . Arthritis   . Asthma   . Atrial fibrillation (HCC)    paroxysmal , addressed  with metoprolol   . Chronic sinusitis   . Complication of anesthesia   . Dyspnea    sob on exertion, reports at her pre-op today 09-13-2018 that her cardiologist has planned for her to do a ECHO for evaluation   . Family history of adverse reaction to anesthesia    sister is hard to wake up from Anesthesia  . Follicular lymphoma ([place])    received Chemotherapy (2 years ago)  and radiation (last 12/2017)  . Hyperlipidemia   . Hypertension   . Hypothyroidism   . Meniere's disease   . PONV (postoperative nausea and vomiting)   . Primary localized osteoarthritis of left knee 09/24/2018  . Primary localized osteoarthritis of right knee 06/18/2018  . Reflux    Past Surgical History:  Procedure Laterality Date  . ABDOMINAL HYSTERECTOMY    . BONE MARROW BIOPSY  06/16/2014   at [place]     bilateral  . CHOLECYSTECTOMY    . KNEE ARTHROSCOPY    . PACEMAKER IMPLANT N/A 03/30/2020   Procedure: PACEMAKER IMPLANT;  Surgeon: Thompson Grayer, MD;  Location: [place] [facility];  Service: Cardiovascular;  Laterality: N/A;  . PARTIAL KNEE ARTHROPLASTY Right 06/18/2018   Procedure: UNICOMPARTMENTAL KNEE;  Surgeon: Marchia Bond, MD;  Location: [facility];  Service: Orthopedics;  Laterality: Right;  . PARTIAL KNEE ARTHROPLASTY Left 09/24/2018   Procedure: UNICOMPARTMENTAL KNEE;  Surgeon: Marchia Bond, MD;  Location: [facility];  Service: Orthopedics;   Laterality: Left;    ROS- all systems are reviewed and negative except as per HPI above  Current Outpatient Medications  Medication Sig Dispense Refill  . amLODipine (NORVASC) 5 MG tablet TAKE 1 TABLET BY MOUTH  DAILY 90 tablet 3  . aspirin EC 81 MG tablet Take 81 mg by mouth daily.    . baclofen (LIORESAL) 10 MG tablet Take 1 tablet (10 mg total) by mouth 3 (three) times daily as needed (muscle spasms). 270 tablet 0  . fluticasone (FLOVENT HFA) 110 MCG/ACT inhaler Inhale 2 puffs into the lungs 2 (two) times daily as needed (for shortness of breath or wheezing). 3 Inhaler 3  . gabapentin (NEURONTIN) 100 MG capsule TAKE 1 CAPSULE BY MOUTH  TWICE DAILY AS NEEDED 180 capsule 2  . hydrochlorothiazide (HYDRODIURIL) 12.5 MG tablet Take 1 tablet (12.5 mg total) by mouth daily. 30 tablet 2  . hydroxychloroquine (PLAQUENIL) 200 MG tablet Take 200 mg by mouth 2 (two) times daily.     . isosorbide mononitrate (IMDUR) 30 MG 24 hr tablet Take 1 tablet (30 mg total) by mouth daily. 90 tablet 3  . levothyroxine (SYNTHROID) 112 MCG tablet TAKE 1 TABLET BY MOUTH  DAILY BEFORE BREAKFAST 90 tablet 3  . losartan (COZAAR) 100 MG tablet Take 1 tablet (100 mg total) by mouth at bedtime. 90 tablet 3  . meclizine (ANTIVERT) 25 MG tablet Take 1 tablet (25 mg  total) by mouth 3 (three) times daily as needed for dizziness. 30 tablet 0  . meloxicam (MOBIC) 15 MG tablet Take 15 mg by mouth daily as needed for pain.    . metoprolol tartrate (LOPRESSOR) 50 MG tablet Take 50 mg by mouth 2 (two) times daily.    Marland Kitchen omeprazole (PRILOSEC) 40 MG capsule TAKE 1 CAPSULE BY MOUTH IN  THE MORNING AND MAY REPEAT  DOSE IN THE EVENING AS  NEEDED FOR ACID REFLUX /  INDIGESTION 180 capsule 3  . ondansetron (ZOFRAN) 4 MG tablet Take 1 tablet (4 mg total) by mouth every 8 (eight) hours as needed for nausea or vomiting. 10 tablet 0  . pravastatin (PRAVACHOL) 80 MG tablet TAKE 1 TABLET BY MOUTH AT  BEDTIME 90 tablet 3  . sennosides-docusate  sodium (SENOKOT-S) 8.6-50 MG tablet Take 2 tablets by mouth daily. 30 tablet 1  . tetrahydrozoline 0.05 % ophthalmic solution Place 2 drops into both eyes daily as needed (for dry eyes).    . triamcinolone (NASACORT ALLERGY 24HR) 55 MCG/ACT AERO nasal inhaler Place 2 sprays into the nose daily. 3 Inhaler 3   No current facility-administered medications for this visit.    Physical Exam: Vitals:   07/12/20 1458  BP: (!) 162/64  Pulse: 80  SpO2: 100%  Weight: 187 lb 12.8 oz (85.2 kg)  Height: '5\' 5"'  (1.651 m)    GEN- The patient is well appearing, alert and oriented x 3 today.   Head- normocephalic, atraumatic Eyes-  Sclera clear, conjunctiva pink Ears- hearing intact Oropharynx- clear Lungs- Clear to ausculation bilaterally, normal work of breathing Chest- pacemaker pocket is well healed Heart- Regular rate and rhythm, no murmurs, rubs or gallops, PMI not laterally displaced GI- soft, NT, ND, + BS Extremities- no clubbing, cyanosis, or edema  Pacemaker interrogation- reviewed in detail today,  See PACEART report  ekg tracing ordered today is personally reviewed and shows sinus with RBBB  Assessment and Plan:  1. Symptomatic mobitz II second degree heart block with syncope Normal pacemaker function See Pace Art report No changes today she is not device dependant today  2. HTN Stable No change required today  3. PVCs Stable No change required today Follows with Dr Orvil Feil at [facility]  4. Disorganized atrial activity/ short AF (very rate) Consider OAC if burden increases.  Pt reports that Dr Orvil Feil is aware.  Risks, benefits and potential toxicities for medications prescribed and/or refilled reviewed with patient today.   Follow-up with Dr Remus Blake going forward I will see as needed  Thompson Grayer MD, [facility] 07/12/2020 3:05 PM

## 2020-07-22 DIAGNOSIS — M2550 Pain in unspecified joint: Secondary | ICD-10-CM | POA: Diagnosis not present

## 2020-07-22 DIAGNOSIS — M791 Myalgia, unspecified site: Secondary | ICD-10-CM | POA: Diagnosis not present

## 2020-07-22 DIAGNOSIS — Z6831 Body mass index (BMI) 31.0-31.9, adult: Secondary | ICD-10-CM | POA: Diagnosis not present

## 2020-07-22 DIAGNOSIS — R768 Other specified abnormal immunological findings in serum: Secondary | ICD-10-CM | POA: Diagnosis not present

## 2020-07-22 DIAGNOSIS — E669 Obesity, unspecified: Secondary | ICD-10-CM | POA: Diagnosis not present

## 2020-07-22 DIAGNOSIS — R5383 Other fatigue: Secondary | ICD-10-CM | POA: Diagnosis not present

## 2020-07-22 DIAGNOSIS — M150 Primary generalized (osteo)arthritis: Secondary | ICD-10-CM | POA: Diagnosis not present

## 2020-08-16 ENCOUNTER — Telehealth: Payer: Self-pay | Admitting: Internal Medicine

## 2020-08-16 NOTE — Telephone Encounter (Signed)
Patient states during appointment on 08/12/20 with Dr. Rayann Heman, she was advised that she will be referred to a general cardiologist.  She states while she was at the office she completed paperwork and she assumed this was for a general cardiologist, but she has not heard back from [place]. No referral currently in system. Is Dr. Rayann Heman wanting the patient to follow up with gen cards?   Please advise.

## 2020-08-16 NOTE — Telephone Encounter (Signed)
Returned the call to the patient. She stated that she has seen Dr. Rayann Heman for a pacemaker implant and will follow up with him as needed but she needs a general cardiologist. She would like to see Dr. Stanford Breed since she saw him when she was in the hospital and her daughter sees him as well.   She has already had her paperwork sent to Dr. Stanford Breed as well.

## 2020-08-16 NOTE — Telephone Encounter (Signed)
Left message to call back.   Patient had 3 month post pacemaker implant office visit after hospital admission which required implant. At this visit it looks like MD notes to follow up with Dr. Remus Blake going forward and Dr. Rayann Heman as needed.

## 2020-08-16 NOTE — Telephone Encounter (Signed)
Patient is following up. She states she is aware that she is to follow up with Dr. Remus Blake, but she would like to also see Dr. Stanford Breed.

## 2020-08-17 NOTE — Telephone Encounter (Signed)
Ok to schedule fuov with me Kirk Ruths

## 2020-08-17 NOTE — Telephone Encounter (Signed)
Spoke with pt, Follow up scheduled  

## 2020-08-23 ENCOUNTER — Other Ambulatory Visit: Payer: Self-pay

## 2020-08-23 MED ORDER — ISOSORBIDE MONONITRATE ER 30 MG PO TB24
30.0000 mg | ORAL_TABLET | Freq: Every day | ORAL | 1 refills | Status: DC
Start: 2020-08-23 — End: 2021-01-05

## 2020-08-30 DIAGNOSIS — I10 Essential (primary) hypertension: Secondary | ICD-10-CM | POA: Diagnosis not present

## 2020-08-30 DIAGNOSIS — K449 Diaphragmatic hernia without obstruction or gangrene: Secondary | ICD-10-CM | POA: Diagnosis not present

## 2020-08-30 DIAGNOSIS — Z79899 Other long term (current) drug therapy: Secondary | ICD-10-CM | POA: Diagnosis not present

## 2020-08-30 DIAGNOSIS — K2950 Unspecified chronic gastritis without bleeding: Secondary | ICD-10-CM | POA: Diagnosis not present

## 2020-08-30 DIAGNOSIS — R1319 Other dysphagia: Secondary | ICD-10-CM | POA: Diagnosis not present

## 2020-08-30 DIAGNOSIS — R1310 Dysphagia, unspecified: Secondary | ICD-10-CM | POA: Diagnosis not present

## 2020-08-30 DIAGNOSIS — Z7982 Long term (current) use of aspirin: Secondary | ICD-10-CM | POA: Diagnosis not present

## 2020-08-30 DIAGNOSIS — I480 Paroxysmal atrial fibrillation: Secondary | ICD-10-CM | POA: Diagnosis not present

## 2020-08-30 DIAGNOSIS — K219 Gastro-esophageal reflux disease without esophagitis: Secondary | ICD-10-CM | POA: Diagnosis not present

## 2020-08-30 DIAGNOSIS — Z950 Presence of cardiac pacemaker: Secondary | ICD-10-CM | POA: Diagnosis not present

## 2020-09-08 ENCOUNTER — Encounter: Payer: Self-pay | Admitting: Osteopathic Medicine

## 2020-09-08 ENCOUNTER — Ambulatory Visit (INDEPENDENT_AMBULATORY_CARE_PROVIDER_SITE_OTHER): Payer: Medicare Other | Admitting: Osteopathic Medicine

## 2020-09-08 ENCOUNTER — Other Ambulatory Visit: Payer: Self-pay

## 2020-09-08 VITALS — BP 173/87 | HR 76 | Temp 97.5°F | Wt 182.1 lb

## 2020-09-08 DIAGNOSIS — E039 Hypothyroidism, unspecified: Secondary | ICD-10-CM | POA: Diagnosis not present

## 2020-09-08 DIAGNOSIS — R399 Unspecified symptoms and signs involving the genitourinary system: Secondary | ICD-10-CM | POA: Diagnosis not present

## 2020-09-08 DIAGNOSIS — N904 Leukoplakia of vulva: Secondary | ICD-10-CM

## 2020-09-08 DIAGNOSIS — R300 Dysuria: Secondary | ICD-10-CM

## 2020-09-08 LAB — CBC
HCT: 40.9 % (ref 35.0–45.0)
Hemoglobin: 13.6 g/dL (ref 11.7–15.5)
MCH: 28.3 pg (ref 27.0–33.0)
MCHC: 33.3 g/dL (ref 32.0–36.0)
MCV: 85.2 fL (ref 80.0–100.0)
MPV: 10.7 fL (ref 7.5–12.5)
Platelets: 240 10*3/uL (ref 140–400)
RBC: 4.8 10*6/uL (ref 3.80–5.10)
RDW: 12.9 % (ref 11.0–15.0)
WBC: 5.8 10*3/uL (ref 3.8–10.8)

## 2020-09-08 LAB — POCT URINALYSIS DIP (CLINITEK)
Bilirubin, UA: NEGATIVE
Blood, UA: NEGATIVE
Glucose, UA: NEGATIVE mg/dL
Ketones, POC UA: NEGATIVE mg/dL
Leukocytes, UA: NEGATIVE
Nitrite, UA: NEGATIVE
POC PROTEIN,UA: NEGATIVE
Spec Grav, UA: 1.015 (ref 1.010–1.025)
Urobilinogen, UA: 0.2 E.U./dL
pH, UA: 5.5 (ref 5.0–8.0)

## 2020-09-08 LAB — COMPLETE METABOLIC PANEL WITH GFR
AG Ratio: 1.7 (calc) (ref 1.0–2.5)
ALT: 13 U/L (ref 6–29)
AST: 17 U/L (ref 10–35)
Albumin: 4.4 g/dL (ref 3.6–5.1)
Alkaline phosphatase (APISO): 54 U/L (ref 37–153)
BUN/Creatinine Ratio: 21 (calc) (ref 6–22)
BUN: 25 mg/dL (ref 7–25)
CO2: 30 mmol/L (ref 20–32)
Calcium: 9.7 mg/dL (ref 8.6–10.4)
Chloride: 98 mmol/L (ref 98–110)
Creat: 1.17 mg/dL — ABNORMAL HIGH (ref 0.60–0.88)
GFR, Est African American: 50 mL/min/{1.73_m2} — ABNORMAL LOW (ref >=60)
GFR, Est Non African American: 43 mL/min/{1.73_m2} — ABNORMAL LOW (ref >=60)
Globulin: 2.6 g/dL (calc) (ref 1.9–3.7)
Glucose, Bld: 88 mg/dL (ref 65–99)
Potassium: 4.4 mmol/L (ref 3.5–5.3)
Sodium: 136 mmol/L (ref 135–146)
Total Bilirubin: 1.3 mg/dL — ABNORMAL HIGH (ref 0.2–1.2)
Total Protein: 7 g/dL (ref 6.1–8.1)

## 2020-09-08 LAB — TSH: TSH: 3.06 mIU/L (ref 0.40–4.50)

## 2020-09-08 MED ORDER — MOMETASONE FUROATE 0.1 % EX CREA: 1.0000 "application " | TOPICAL_CREAM | Freq: Every day | CUTANEOUS | 1 refills | Status: DC

## 2020-09-08 MED ORDER — FLUCONAZOLE 150 MG PO TABS: 150.0000 mg | ORAL_TABLET | Freq: Once | ORAL | 1 refills | Status: AC

## 2020-09-08 NOTE — Progress Notes (Signed)
[name] is a 86 y.o. female who presents to  [place] at [facility]  today, 09/08/20, seeking care for the following:  Dysuria and frequency ongoing several months. Pink, irritated skin on vulvar/rectal area itches. No OTC meds, cold water to the area helps. No skin ulceration, no vaginal bleeding or discharge. No hematuria. No incontinence. Pt not going to bathroom right away when she feels urge, trying to hold it   On exam, pink color to vulvar and rectal area, no vaginal discharge noted, no whitening of the skin, no erythema/ulceration.     ASSESSMENT & PLAN with other pertinent findings:  The primary encounter diagnosis was Lichen sclerosus of female genitalia. Diagnoses of UTI symptoms, Dysuria, and Hypothyroidism, unspecified type were also pertinent to this visit.   Looks like mild / early lichen sclerosus Ddx would include vulvar candidal infection --> treat both Cannot r/o UTI as complicating factor --> await UCx --> timed voiding while awake --> avoid irritants, caffeine  --> low threshold for referral to urogyn (hx bladder sling procedure)   Patient Instructions   Plan: This looks like a condition called Lichen Sclerosus (see below) - I think irritation from this is causing the urinary issues, but will also wait on further tests for UTI. Right now urine looks ok in the office. I sent Diflucan to treat possible yeast infection, start the steroid cream as well to help itching/rash. We might think about vaginal estrogen supplementation.     Lichen Sclerosus Lichen sclerosus is a skin problem. It can happen on any part of the body, but it commonly involves the anal and genital areas. It can cause itching and discomfort in these areas. Treatment can help to control symptoms. When the genital area is affected, getting treatment is important because the condition can cause scarring that may lead to other problems if left  untreated. What are the causes? The cause of this condition is not known. It may be related to an overactive immune system or a lack of certain hormones. Lichen sclerosus is not an infection or a fungus, and it is not passed from one person to another (non-contagious). What increases the risk? The following factors may make you more likely to develop this condition:  You are a woman who has reached menopause.  You are a man who was not circumcised. This condition may also develop for the first time in children, usually before they enter puberty. What are the signs or symptoms? Symptoms of this condition include:  White areas (plaques) on the skin that may be thin and wrinkled, or thickened.  Red and swollen patches (lesions) on the skin.  Tears or cracks in the skin.  Bruising.  Blood blisters.  Severe itching.  Pain, itching, or burning when urinating. Constipation is also common in children with lichen sclerosus, but can be seen in adults.   How is this diagnosed? This condition may be diagnosed with a physical exam. In some cases, a tissue sample may be removed to be checked under a microscope (biopsy). How is this treated? This condition may be treated with:  Topical steroids. These are medicated creams or ointments that are applied over the affected areas.  Medicines that are taken by mouth.  Topical immunotherapy. These are medicated creams or ointments that are applied over the affected areas. They stimulate your immune system to fight the skin condition. This may be used if steroids are not effective.  Surgery. This may be  needed in more severe cases that are causing problems such as scarring. Follow these instructions at home: Medicines  Take over-the-counter and prescription medicines only as told by your health care provider.  Use creams or ointments as told by your health care provider. Skin care  Do not scratch the affected areas of skin.  If you are a  woman, be sure to keep the vaginal area as clean and dry as possible.  Clean the affected area of skin gently with water only. Pat skin dry and avoid the use of rough towels or toilet paper.  Avoid irritating skin products, including soap and scented lotions. Use emollient creams as directed by your health care provider to help reduce itching. General instructions  Keep all follow-up visits. This is important.  Your condition may cause constipation. To prevent or treat constipation, you may need to: ? Drink enough fluid to keep your urine pale yellow. ? Take over-the-counter or prescription medicines. ? Eat foods that are high in fiber, such as beans, whole grains, and fresh fruits and vegetables. ? Limit foods that are high in fat and processed sugars, such as fried or sweet foods. Contact a health care provider if:  You have increasing redness, swelling, or pain in the affected area.  You have fluid, blood, or pus coming from the affected area.  You have new lesions on your skin.  You have a fever.  You have pain during sex. Get help right away if:  You develop severe pain or burning in the affected areas, especially in the genital area. Summary  Lichen sclerosus is a skin problem. When the genital area is affected, getting treatment is important because the condition can cause scarring that may lead to other problems if left untreated.  This condition is usually treated with medicated creams or ointments (topical steroids) that are applied over the affected areas.  Take or use over-the-counter and prescription medicines only as told by your health care provider.  Contact a health care provider if you have new lesions on your skin, have pain during sex, or have increasing redness, swelling, or pain in the affected area.  Keep all follow-up visits. This is important. This information is not intended to replace advice given to you by your health care provider. Make sure you  discuss any questions you have with your health care provider. Document Revised: 11/08/2019 Document Reviewed: 11/08/2019 Elsevier Patient Education  2021 [place].    Orders Placed This Encounter  Procedures  . Urine Culture  . Urinalysis, Routine w reflex microscopic  . COMPLETE METABOLIC PANEL WITH GFR  . TSH  . CBC  . POCT URINALYSIS DIP (CLINITEK)    Meds ordered this encounter  Medications  . mometasone (ELOCON) 0.1 % cream    Sig: Apply 1 application topically daily. To vaginal area as needed for redness/irritation. Can use twice daily for the first week    Dispense:  100 g    Refill:  1  . fluconazole (DIFLUCAN) 150 MG tablet    Sig: Take 1 tablet (150 mg total) by mouth once for 1 dose. Repeat dose 72 hours if yeast infection persists    Dispense:  2 tablet    Refill:  1     See below for relevant physical exam findings  See below for recent lab and imaging results reviewed  Medications, allergies, PMH, PSH, SocH, FamH reviewed below    Follow-up instructions: Return for RECHECK PENDING RESULTS / IF WORSE OR CHANGE.  Exam:  BP (!) 173/87 (BP Location: Left Arm, Patient Position: Sitting, Cuff Size: Normal)   Pulse 76   Temp (!) 97.5 F (36.4 C) (Oral)   Wt 182 lb 1.3 oz (82.6 kg)   BMI 30.30 kg/m   See above  Current Meds  Medication Sig  . amLODipine (NORVASC) 5 MG tablet TAKE 1 TABLET BY MOUTH  DAILY  . aspirin EC 81 MG tablet Take 81 mg by mouth daily.  . baclofen (LIORESAL) 10 MG tablet Take 1 tablet (10 mg total) by mouth 3 (three) times daily as needed (muscle spasms).  . fluconazole (DIFLUCAN) 150 MG tablet Take 1 tablet (150 mg total) by mouth once for 1 dose. Repeat dose 72 hours if yeast infection persists  . fluticasone (FLOVENT HFA) 110 MCG/ACT inhaler Inhale 2 puffs into the lungs 2 (two) times daily as needed (for shortness of breath or wheezing).  .  gabapentin (NEURONTIN) 100 MG capsule TAKE 1 CAPSULE BY MOUTH  TWICE DAILY AS NEEDED  . hydrochlorothiazide (HYDRODIURIL) 12.5 MG tablet Take 1 tablet (12.5 mg total) by mouth daily.  . hydroxychloroquine (PLAQUENIL) 200 MG tablet Take 200 mg by mouth 2 (two) times daily.   . isosorbide mononitrate (IMDUR) 30 MG 24 hr tablet Take 1 tablet (30 mg total) by mouth daily.  Marland Kitchen levothyroxine (SYNTHROID) 112 MCG tablet TAKE 1 TABLET BY MOUTH  DAILY BEFORE BREAKFAST  . losartan (COZAAR) 100 MG tablet Take 1 tablet (100 mg total) by mouth at bedtime.  . meclizine (ANTIVERT) 25 MG tablet Take 1 tablet (25 mg total) by mouth 3 (three) times daily as needed for dizziness.  . meloxicam (MOBIC) 15 MG tablet Take 15 mg by mouth daily as needed for pain.  . metoprolol tartrate (LOPRESSOR) 50 MG tablet Take 50 mg by mouth 2 (two) times daily.  . mometasone (ELOCON) 0.1 % cream Apply 1 application topically daily. To vaginal area as needed for redness/irritation. Can use twice daily for the first week  . omeprazole (PRILOSEC) 40 MG capsule TAKE 1 CAPSULE BY MOUTH IN  THE MORNING AND MAY REPEAT  DOSE IN THE EVENING AS  NEEDED FOR ACID REFLUX /  INDIGESTION  . ondansetron (ZOFRAN) 4 MG tablet Take 1 tablet (4 mg total) by mouth every 8 (eight) hours as needed for nausea or vomiting.  . pravastatin (PRAVACHOL) 80 MG tablet TAKE 1 TABLET BY MOUTH AT  BEDTIME  . sennosides-docusate sodium (SENOKOT-S) 8.6-50 MG tablet Take 2 tablets by mouth daily.  Marland Kitchen tetrahydrozoline 0.05 % ophthalmic solution Place 2 drops into both eyes daily as needed (for dry eyes).  . triamcinolone (NASACORT ALLERGY 24HR) 55 MCG/ACT AERO nasal inhaler Place 2 sprays into the nose daily.    No Known Allergies  Patient Active Problem List   Diagnosis Date Noted  . Heart block 04/07/2020  . Pacemaker 04/07/2020  . Syncope 03/28/2020  . Shingles 10/24/2019  . MTHFR gene mutation 10/09/2018  . Primary localized osteoarthritis of left knee  09/24/2018  . S/P left unicompartmental knee replacement 09/24/2018  . Fatigue 09/03/2018  . Myalgia 09/03/2018  . Vertigo 09/03/2018  . Sebaceous cyst 09/03/2018  . Callus of foot 09/03/2018  . Primary localized osteoarthritis of right knee 06/18/2018  . Status post right partial knee replacement 06/18/2018  . Nasal polyposis 05/16/2018  . Class 1 obesity due to excess calories with serious comorbidity and body mass index (BMI) of 30.0 to 30.9 in adult 07/16/2017  . DDD (degenerative disc disease), cervical  07/16/2017  . GERD (gastroesophageal reflux disease) 07/16/2017  . Other hyperlipidemia 09/27/2016  . Paroxysmal atrial fibrillation ([place]) 09/27/2016  . Benign essential hypertension 09/15/2015  . Follicular lymphoma ([place]) 12/18/2014  . Lymphoma ([place]) 07/28/2014  . Hypothyroidism 01/14/2014  . Acquired trigger finger 08/05/2013  . Carpal tunnel syndrome on both sides 06/30/2013  . Dyspnea 12/25/2012    Family History  Problem Relation Age of Onset  . Heart disease Other   . Skin cancer Mother   . Stroke Mother   . Prostate cancer Father   . High blood pressure Father   . Heart attack Father     Social History   Tobacco Use  Smoking Status Never Smoker  Smokeless Tobacco Never Used    Past Surgical History:  Procedure Laterality Date  . ABDOMINAL HYSTERECTOMY    . BONE MARROW BIOPSY  06/16/2014   at [place]     bilateral  . CHOLECYSTECTOMY    . KNEE ARTHROSCOPY    . PACEMAKER IMPLANT N/A 03/30/2020   Procedure: PACEMAKER IMPLANT;  Surgeon: Thompson Grayer, MD;  Location: [place] [facility];  Service: Cardiovascular;  Laterality: N/A;  . PARTIAL KNEE ARTHROPLASTY Right 06/18/2018   Procedure: UNICOMPARTMENTAL KNEE;  Surgeon: Marchia Bond, MD;  Location: [facility];  Service: Orthopedics;  Laterality: Right;  . PARTIAL KNEE ARTHROPLASTY Left 09/24/2018   Procedure: UNICOMPARTMENTAL KNEE;  Surgeon: Marchia Bond, MD;  Location: [facility];   Service: Orthopedics;  Laterality: Left;    Immunization History  Administered Date(s) Administered  . Influenza-Unspecified 04/23/2017, 05/10/2018, 08/04/2018, 07/10/2019  . PFIZER(Purple Top)SARS-COV-2 Vaccination 07/24/2019, 08/13/2019, 03/05/2020    Recent Results (from the past 2160 hour(s))  POCT URINALYSIS DIP (CLINITEK)     Status: None   Collection Time: 09/08/20 10:42 AM  Result Value Ref Range   Color, UA yellow yellow   Clarity, UA clear clear   Glucose, UA negative negative mg/dL   Bilirubin, UA negative negative   Ketones, POC UA negative negative mg/dL   Spec Grav, UA 1.015 1.010 - 1.025   Blood, UA negative negative   pH, UA 5.5 5.0 - 8.0   POC PROTEIN,UA negative negative, trace   Urobilinogen, UA 0.2 0.2 or 1.0 E.U./dL   Nitrite, UA Negative Negative   Leukocytes, UA Negative Negative    No results found.     All questions at time of visit were answered - patient instructed to contact office with any additional concerns or updates. ER/RTC precautions were reviewed with the patient as applicable.   Please note: manual typing as well as voice recognition software may have been used to produce this document - typos may escape review. Please contact Dr. Sheppard Coil for any needed clarifications.

## 2020-09-08 NOTE — Patient Instructions (Addendum)
Plan: This looks like a condition called Lichen Sclerosus (see below) - I think irritation from this is causing the urinary issues, but will also wait on further tests for UTI. Right now urine looks ok in the office. I sent Diflucan to treat possible yeast infection, start the steroid cream as well to help itching/rash. We might think about vaginal estrogen supplementation.     Lichen Sclerosus Lichen sclerosus is a skin problem. It can happen on any part of the body, but it commonly involves the anal and genital areas. It can cause itching and discomfort in these areas. Treatment can help to control symptoms. When the genital area is affected, getting treatment is important because the condition can cause scarring that may lead to other problems if left untreated. What are the causes? The cause of this condition is not known. It may be related to an overactive immune system or a lack of certain hormones. Lichen sclerosus is not an infection or a fungus, and it is not passed from one person to another (non-contagious). What increases the risk? The following factors may make you more likely to develop this condition:  You are a woman who has reached menopause.  You are a man who was not circumcised. This condition may also develop for the first time in children, usually before they enter puberty. What are the signs or symptoms? Symptoms of this condition include:  White areas (plaques) on the skin that may be thin and wrinkled, or thickened.  Red and swollen patches (lesions) on the skin.  Tears or cracks in the skin.  Bruising.  Blood blisters.  Severe itching.  Pain, itching, or burning when urinating. Constipation is also common in children with lichen sclerosus, but can be seen in adults.   How is this diagnosed? This condition may be diagnosed with a physical exam. In some cases, a tissue sample may be removed to be checked under a microscope (biopsy). How is this  treated? This condition may be treated with:  Topical steroids. These are medicated creams or ointments that are applied over the affected areas.  Medicines that are taken by mouth.  Topical immunotherapy. These are medicated creams or ointments that are applied over the affected areas. They stimulate your immune system to fight the skin condition. This may be used if steroids are not effective.  Surgery. This may be needed in more severe cases that are causing problems such as scarring. Follow these instructions at home: Medicines  Take over-the-counter and prescription medicines only as told by your health care provider.  Use creams or ointments as told by your health care provider. Skin care  Do not scratch the affected areas of skin.  If you are a woman, be sure to keep the vaginal area as clean and dry as possible.  Clean the affected area of skin gently with water only. Pat skin dry and avoid the use of rough towels or toilet paper.  Avoid irritating skin products, including soap and scented lotions. Use emollient creams as directed by your health care provider to help reduce itching. General instructions  Keep all follow-up visits. This is important.  Your condition may cause constipation. To prevent or treat constipation, you may need to: ? Drink enough fluid to keep your urine pale yellow. ? Take over-the-counter or prescription medicines. ? Eat foods that are high in fiber, such as beans, whole grains, and fresh fruits and vegetables. ? Limit foods that are high in fat and processed sugars, such  as fried or sweet foods. Contact a health care provider if:  You have increasing redness, swelling, or pain in the affected area.  You have fluid, blood, or pus coming from the affected area.  You have new lesions on your skin.  You have a fever.  You have pain during sex. Get help right away if:  You develop severe pain or burning in the affected areas, especially in  the genital area. Summary  Lichen sclerosus is a skin problem. When the genital area is affected, getting treatment is important because the condition can cause scarring that may lead to other problems if left untreated.  This condition is usually treated with medicated creams or ointments (topical steroids) that are applied over the affected areas.  Take or use over-the-counter and prescription medicines only as told by your health care provider.  Contact a health care provider if you have new lesions on your skin, have pain during sex, or have increasing redness, swelling, or pain in the affected area.  Keep all follow-up visits. This is important. This information is not intended to replace advice given to you by your health care provider. Make sure you discuss any questions you have with your health care provider. Document Revised: 11/08/2019 Document Reviewed: 11/08/2019 Elsevier Patient Education  [place].

## 2020-09-09 LAB — URINE CULTURE
MICRO NUMBER:: 11599016
Result:: NO GROWTH
SPECIMEN QUALITY:: ADEQUATE

## 2020-09-09 LAB — URINALYSIS, ROUTINE W REFLEX MICROSCOPIC
Bilirubin Urine: NEGATIVE
Glucose, UA: NEGATIVE
Hgb urine dipstick: NEGATIVE
Ketones, ur: NEGATIVE
Leukocytes,Ua: NEGATIVE
Nitrite: NEGATIVE
Protein, ur: NEGATIVE
Specific Gravity, Urine: 1.012 (ref 1.001–1.03)
pH: 5.5 (ref 5.0–8.0)

## 2020-09-29 DIAGNOSIS — Z950 Presence of cardiac pacemaker: Secondary | ICD-10-CM | POA: Diagnosis not present

## 2020-09-29 DIAGNOSIS — I480 Paroxysmal atrial fibrillation: Secondary | ICD-10-CM | POA: Diagnosis not present

## 2020-09-29 DIAGNOSIS — I493 Ventricular premature depolarization: Secondary | ICD-10-CM | POA: Diagnosis not present

## 2020-09-29 DIAGNOSIS — Z45010 Encounter for checking and testing of cardiac pacemaker pulse generator [battery]: Secondary | ICD-10-CM | POA: Diagnosis not present

## 2020-09-29 DIAGNOSIS — I472 Ventricular tachycardia: Secondary | ICD-10-CM | POA: Diagnosis not present

## 2020-09-30 ENCOUNTER — Telehealth: Payer: Self-pay | Admitting: Osteopathic Medicine

## 2020-09-30 ENCOUNTER — Other Ambulatory Visit: Payer: Self-pay | Admitting: Osteopathic Medicine

## 2020-09-30 MED ORDER — MOMETASONE FUROATE 0.1 % EX CREA
1.0000 | TOPICAL_CREAM | Freq: Every day | CUTANEOUS | 3 refills | Status: DC
Start: 2020-09-30 — End: 2020-11-25

## 2020-09-30 NOTE — Progress Notes (Deleted)
HPI: Follow-up history of syncope felt to be bradycardia mediated and hypertension.  Patient admitted with syncope September 2021.  It was noted she was taking metoprolol at the time which was held.  She continued to have bradycardia and ultimately required pacemaker.  Echocardiogram September 2021 showed normal LV function, grade 1 diastolic dysfunction, mild mitral regurgitation, moderate aortic insufficiency.  Carotid Dopplers September 2021 showed 1 to 39% bilateral stenosis.  Currently noted to have short episodes of atrial fibrillation on device interrogation.  Followed by Dr. Orvil Feil.  Since last seen  Current Outpatient Medications  Medication Sig Dispense Refill  . amLODipine (NORVASC) 5 MG tablet TAKE 1 TABLET BY MOUTH  DAILY 90 tablet 3  . aspirin EC 81 MG tablet Take 81 mg by mouth daily.    . baclofen (LIORESAL) 10 MG tablet Take 1 tablet (10 mg total) by mouth 3 (three) times daily as needed (muscle spasms). 270 tablet 0  . fluticasone (FLOVENT HFA) 110 MCG/ACT inhaler Inhale 2 puffs into the lungs 2 (two) times daily as needed (for shortness of breath or wheezing). 3 Inhaler 3  . gabapentin (NEURONTIN) 100 MG capsule TAKE 1 CAPSULE BY MOUTH  TWICE DAILY AS NEEDED 180 capsule 2  . hydrochlorothiazide (HYDRODIURIL) 12.5 MG tablet Take 1 tablet (12.5 mg total) by mouth daily. 30 tablet 2  . hydroxychloroquine (PLAQUENIL) 200 MG tablet Take 200 mg by mouth 2 (two) times daily.     . isosorbide mononitrate (IMDUR) 30 MG 24 hr tablet Take 1 tablet (30 mg total) by mouth daily. 90 tablet 1  . levothyroxine (SYNTHROID) 112 MCG tablet TAKE 1 TABLET BY MOUTH  DAILY BEFORE BREAKFAST 90 tablet 3  . losartan (COZAAR) 100 MG tablet Take 1 tablet (100 mg total) by mouth at bedtime. 90 tablet 3  . meclizine (ANTIVERT) 25 MG tablet Take 1 tablet (25 mg total) by mouth 3 (three) times daily as needed for dizziness. 30 tablet 0  . meloxicam (MOBIC) 15 MG tablet Take 15 mg by mouth daily as needed  for pain.    . metoprolol tartrate (LOPRESSOR) 50 MG tablet Take 50 mg by mouth 2 (two) times daily.    . mometasone (ELOCON) 0.1 % cream Apply 1 application topically daily. To vaginal area as needed for redness/irritation. 100 g 3  . omeprazole (PRILOSEC) 40 MG capsule TAKE 1 CAPSULE BY MOUTH IN  THE MORNING AND MAY REPEAT  DOSE IN THE EVENING AS  NEEDED FOR ACID REFLUX /  INDIGESTION 180 capsule 3  . ondansetron (ZOFRAN) 4 MG tablet Take 1 tablet (4 mg total) by mouth every 8 (eight) hours as needed for nausea or vomiting. 10 tablet 0  . pravastatin (PRAVACHOL) 80 MG tablet TAKE 1 TABLET BY MOUTH AT  BEDTIME 90 tablet 3  . sennosides-docusate sodium (SENOKOT-S) 8.6-50 MG tablet Take 2 tablets by mouth daily. 30 tablet 1  . tetrahydrozoline 0.05 % ophthalmic solution Place 2 drops into both eyes daily as needed (for dry eyes).    . triamcinolone (NASACORT ALLERGY 24HR) 55 MCG/ACT AERO nasal inhaler Place 2 sprays into the nose daily. 3 Inhaler 3   No current facility-administered medications for this visit.     Past Medical History:  Diagnosis Date  . Arthritis   . Asthma   . Atrial fibrillation (HCC)    paroxysmal , addressed  with metoprolol   . Chronic sinusitis   . Complication of anesthesia   . Dyspnea  sob on exertion, reports at her pre-op today 09-13-2018 that her cardiologist has planned for her to do a ECHO for evaluation   . Family history of adverse reaction to anesthesia    sister is hard to wake up from Anesthesia  . Follicular lymphoma ([place])    received Chemotherapy (2 years ago)  and radiation (last 12/2017)  . Hyperlipidemia   . Hypertension   . Hypothyroidism   . Meniere's disease   . PONV (postoperative nausea and vomiting)   . Primary localized osteoarthritis of left knee 09/24/2018  . Primary localized osteoarthritis of right knee 06/18/2018  . Reflux     Past Surgical History:  Procedure Laterality Date  . ABDOMINAL HYSTERECTOMY    . BONE MARROW BIOPSY   06/16/2014   at [facility]     bilateral  . CHOLECYSTECTOMY    . KNEE ARTHROSCOPY    . PACEMAKER IMPLANT N/A 03/30/2020   Procedure: PACEMAKER IMPLANT;  Surgeon: Thompson Grayer, MD;  Location: [facility];  Service: Cardiovascular;  Laterality: N/A;  . PARTIAL KNEE ARTHROPLASTY Right 06/18/2018   Procedure: UNICOMPARTMENTAL KNEE;  Surgeon: Marchia Bond, MD;  Location: [facility];  Service: Orthopedics;  Laterality: Right;  . PARTIAL KNEE ARTHROPLASTY Left 09/24/2018   Procedure: UNICOMPARTMENTAL KNEE;  Surgeon: Marchia Bond, MD;  Location: [facility];  Service: Orthopedics;  Laterality: Left;    Social History   Socioeconomic History  . Marital status: Widowed    Spouse name: Not on file  . Number of children: 1  . Years of education: 50  . Highest education level: 12th grade  Occupational History  . Occupation: Theatre manager    Comment: retired  Tobacco Use  . Smoking status: Never Smoker  . Smokeless tobacco: Never Used  Vaping Use  . Vaping Use: Never used  Substance and Sexual Activity  . Alcohol use: No  . Drug use: No  . Sexual activity: Not Currently    Partners: Male  Other Topics Concern  . Not on file  Social History Narrative  . Not on file   Social Determinants of Health   Financial Resource Strain: Not on file  Food Insecurity: Not on file  Transportation Needs: Not on file  Physical Activity: Not on file  Stress: Not on file  Social Connections: Not on file  Intimate Partner Violence: Not on file    Family History  Problem Relation Age of Onset  . Heart disease Other   . Skin cancer Mother   . Stroke Mother   . Prostate cancer Father   . High blood pressure Father   . Heart attack Father     ROS: no fevers or chills, productive cough, hemoptysis, dysphasia, odynophagia, melena, hematochezia, dysuria, hematuria, rash, seizure activity, orthopnea, PND, pedal edema, claudication. Remaining systems are  negative.  Physical Exam: Well-developed well-nourished in no acute distress.  Skin is warm and dry.  HEENT is normal.  Neck is supple.  Chest is clear to auscultation with normal expansion.  Cardiovascular exam is regular rate and rhythm.  Abdominal exam nontender or distended. No masses palpated. Extremities show no edema. neuro grossly intact  ECG- personally reviewed  A/P  1 history of syncope-status post pacemaker; no recurrences.  2 short episodes of atrial fibrillation-  3 moderate aortic insufficiency-patient will need follow-up echocardiogram September 2022.  4 hypertension-patient's blood pressure is controlled.  Continue present medications and follow.  5 status post pacemaker-follow-up Dr. Orvil Feil.  6  PVCs  Kirk Ruths, MD

## 2020-09-30 NOTE — Telephone Encounter (Signed)
Task completed. Pt has been updated regarding provider's recommendations. Per pt, the itching has gone away, but she still has a few bumps. Pt aware to contact the clinic if itching return or her symptoms should worsen.

## 2020-09-30 NOTE — Telephone Encounter (Signed)
Please call patient: daughter said pt had some questions about vaginal cream refills - please confirm what patient had questions about (I already sent refills on mometasone), can let patient know as long as itching is better she can use the cream as needed / use maintenance dose every other day

## 2020-10-06 ENCOUNTER — Ambulatory Visit: Payer: Medicare Other | Admitting: Cardiology

## 2020-10-08 DIAGNOSIS — R1310 Dysphagia, unspecified: Secondary | ICD-10-CM | POA: Diagnosis not present

## 2020-10-08 DIAGNOSIS — R1110 Vomiting, unspecified: Secondary | ICD-10-CM | POA: Diagnosis not present

## 2020-10-26 DIAGNOSIS — C8210 Follicular lymphoma grade II, unspecified site: Secondary | ICD-10-CM | POA: Diagnosis not present

## 2020-10-26 DIAGNOSIS — Z8719 Personal history of other diseases of the digestive system: Secondary | ICD-10-CM | POA: Diagnosis not present

## 2020-10-26 DIAGNOSIS — K2289 Other specified disease of esophagus: Secondary | ICD-10-CM | POA: Diagnosis not present

## 2020-10-26 DIAGNOSIS — I441 Atrioventricular block, second degree: Secondary | ICD-10-CM | POA: Diagnosis not present

## 2020-10-26 DIAGNOSIS — K219 Gastro-esophageal reflux disease without esophagitis: Secondary | ICD-10-CM | POA: Diagnosis not present

## 2020-10-26 DIAGNOSIS — Z79899 Other long term (current) drug therapy: Secondary | ICD-10-CM | POA: Diagnosis not present

## 2020-10-26 DIAGNOSIS — J329 Chronic sinusitis, unspecified: Secondary | ICD-10-CM | POA: Diagnosis not present

## 2020-10-26 DIAGNOSIS — K449 Diaphragmatic hernia without obstruction or gangrene: Secondary | ICD-10-CM | POA: Diagnosis not present

## 2020-10-26 DIAGNOSIS — E785 Hyperlipidemia, unspecified: Secondary | ICD-10-CM | POA: Diagnosis not present

## 2020-10-26 DIAGNOSIS — Z9221 Personal history of antineoplastic chemotherapy: Secondary | ICD-10-CM | POA: Diagnosis not present

## 2020-10-26 DIAGNOSIS — Z923 Personal history of irradiation: Secondary | ICD-10-CM | POA: Diagnosis not present

## 2020-10-26 DIAGNOSIS — I10 Essential (primary) hypertension: Secondary | ICD-10-CM | POA: Diagnosis not present

## 2020-10-26 DIAGNOSIS — E039 Hypothyroidism, unspecified: Secondary | ICD-10-CM | POA: Diagnosis not present

## 2020-10-26 DIAGNOSIS — Z950 Presence of cardiac pacemaker: Secondary | ICD-10-CM | POA: Diagnosis not present

## 2020-10-26 DIAGNOSIS — Z9889 Other specified postprocedural states: Secondary | ICD-10-CM | POA: Diagnosis not present

## 2020-10-26 DIAGNOSIS — R1319 Other dysphagia: Secondary | ICD-10-CM | POA: Diagnosis not present

## 2020-11-03 DIAGNOSIS — H353132 Nonexudative age-related macular degeneration, bilateral, intermediate dry stage: Secondary | ICD-10-CM | POA: Diagnosis not present

## 2020-11-03 DIAGNOSIS — Z961 Presence of intraocular lens: Secondary | ICD-10-CM | POA: Diagnosis not present

## 2020-11-03 DIAGNOSIS — D3131 Benign neoplasm of right choroid: Secondary | ICD-10-CM | POA: Diagnosis not present

## 2020-11-03 DIAGNOSIS — H04123 Dry eye syndrome of bilateral lacrimal glands: Secondary | ICD-10-CM | POA: Diagnosis not present

## 2020-11-04 ENCOUNTER — Other Ambulatory Visit: Payer: Self-pay | Admitting: Osteopathic Medicine

## 2020-11-05 DIAGNOSIS — R1319 Other dysphagia: Secondary | ICD-10-CM | POA: Diagnosis not present

## 2020-11-17 NOTE — Progress Notes (Deleted)
HPI: Follow-up hypertension and pacemaker.  Patient admitted September 2021 with syncope.  Carotid Dopplers September 2021 showed 1 to 39% bilateral stenosis.  Echocardiogram showed normal LV function, mild left ventricular hypertrophy, grade 1 diastolic dysfunction, moderate aortic insufficiency and mild mitral regurgitation.  Patient underwent pacemaker placement.  She also has a history of PVCs previously followed at [facility].  Patient apparently noted to have short atrial fibrillation on previous device interrogation and per Dr. Rayann Heman oral anticoagulation should be considered if burden increased.  Since last seen  Current Outpatient Medications  Medication Sig Dispense Refill  . amLODipine (NORVASC) 5 MG tablet TAKE 1 TABLET BY MOUTH  DAILY 90 tablet 3  . aspirin EC 81 MG tablet Take 81 mg by mouth daily.    . baclofen (LIORESAL) 10 MG tablet Take 1 tablet (10 mg total) by mouth 3 (three) times daily as needed (muscle spasms). 270 tablet 0  . fluticasone (FLOVENT HFA) 110 MCG/ACT inhaler Inhale 2 puffs into the lungs 2 (two) times daily as needed (for shortness of breath or wheezing). 3 Inhaler 3  . gabapentin (NEURONTIN) 100 MG capsule TAKE 1 CAPSULE BY MOUTH  TWICE DAILY AS NEEDED 180 capsule 2  . hydrochlorothiazide (HYDRODIURIL) 12.5 MG tablet Take 1 tablet (12.5 mg total) by mouth daily. 30 tablet 2  . hydroxychloroquine (PLAQUENIL) 200 MG tablet Take 200 mg by mouth 2 (two) times daily.     . isosorbide mononitrate (IMDUR) 30 MG 24 hr tablet Take 1 tablet (30 mg total) by mouth daily. 90 tablet 1  . levothyroxine (SYNTHROID) 112 MCG tablet TAKE 1 TABLET BY MOUTH  DAILY BEFORE BREAKFAST 90 tablet 1  . losartan (COZAAR) 100 MG tablet Take 1 tablet (100 mg total) by mouth at bedtime. 90 tablet 3  . meclizine (ANTIVERT) 25 MG tablet Take 1 tablet (25 mg total) by mouth 3 (three) times daily as needed for dizziness. 30 tablet 0  . meloxicam (MOBIC) 15 MG tablet Take 15 mg by mouth  daily as needed for pain.    . metoprolol tartrate (LOPRESSOR) 50 MG tablet Take 50 mg by mouth 2 (two) times daily.    . mometasone (ELOCON) 0.1 % cream Apply 1 application topically daily. To vaginal area as needed for redness/irritation. 100 g 3  . omeprazole (PRILOSEC) 40 MG capsule TAKE 1 CAPSULE BY MOUTH IN  THE MORNING AND MAY REPEAT  DOSE IN THE EVENING AS  NEEDED FOR ACID REFLUX /  INDIGESTION 180 capsule 3  . ondansetron (ZOFRAN) 4 MG tablet Take 1 tablet (4 mg total) by mouth every 8 (eight) hours as needed for nausea or vomiting. 10 tablet 0  . pravastatin (PRAVACHOL) 80 MG tablet TAKE 1 TABLET BY MOUTH AT  BEDTIME 90 tablet 3  . sennosides-docusate sodium (SENOKOT-S) 8.6-50 MG tablet Take 2 tablets by mouth daily. 30 tablet 1  . tetrahydrozoline 0.05 % ophthalmic solution Place 2 drops into both eyes daily as needed (for dry eyes).    . triamcinolone (NASACORT ALLERGY 24HR) 55 MCG/ACT AERO nasal inhaler Place 2 sprays into the nose daily. 3 Inhaler 3   No current facility-administered medications for this visit.     Past Medical History:  Diagnosis Date  . Arthritis   . Asthma   . Atrial fibrillation (HCC)    paroxysmal , addressed  with metoprolol   . Chronic sinusitis   . Complication of anesthesia   . Dyspnea    sob on exertion,  reports at her pre-op today 09-13-2018 that her cardiologist has planned for her to do a ECHO for evaluation   . Family history of adverse reaction to anesthesia    sister is hard to wake up from Anesthesia  . Follicular lymphoma ([place])    received Chemotherapy (2 years ago)  and radiation (last 12/2017)  . Hyperlipidemia   . Hypertension   . Hypothyroidism   . Meniere's disease   . PONV (postoperative nausea and vomiting)   . Primary localized osteoarthritis of left knee 09/24/2018  . Primary localized osteoarthritis of right knee 06/18/2018  . Reflux     Past Surgical History:  Procedure Laterality Date  . ABDOMINAL HYSTERECTOMY    .  BONE MARROW BIOPSY  06/16/2014   at [place]     bilateral  . CHOLECYSTECTOMY    . KNEE ARTHROSCOPY    . PACEMAKER IMPLANT N/A 03/30/2020   Procedure: PACEMAKER IMPLANT;  Surgeon: Thompson Grayer, MD;  Location: [facility];  Service: Cardiovascular;  Laterality: N/A;  . PARTIAL KNEE ARTHROPLASTY Right 06/18/2018   Procedure: UNICOMPARTMENTAL KNEE;  Surgeon: Marchia Bond, MD;  Location: [facility];  Service: Orthopedics;  Laterality: Right;  . PARTIAL KNEE ARTHROPLASTY Left 09/24/2018   Procedure: UNICOMPARTMENTAL KNEE;  Surgeon: Marchia Bond, MD;  Location: [facility];  Service: Orthopedics;  Laterality: Left;    Social History   Socioeconomic History  . Marital status: Widowed    Spouse name: Not on file  . Number of children: 1  . Years of education: 16  . Highest education level: 12th grade  Occupational History  . Occupation: Theatre manager    Comment: retired  Tobacco Use  . Smoking status: Never Smoker  . Smokeless tobacco: Never Used  Vaping Use  . Vaping Use: Never used  Substance and Sexual Activity  . Alcohol use: No  . Drug use: No  . Sexual activity: Not Currently    Partners: Male  Other Topics Concern  . Not on file  Social History Narrative  . Not on file   Social Determinants of Health   Financial Resource Strain: Not on file  Food Insecurity: Not on file  Transportation Needs: Not on file  Physical Activity: Not on file  Stress: Not on file  Social Connections: Not on file  Intimate Partner Violence: Not on file    Family History  Problem Relation Age of Onset  . Heart disease Other   . Skin cancer Mother   . Stroke Mother   . Prostate cancer Father   . High blood pressure Father   . Heart attack Father     ROS: no fevers or chills, productive cough, hemoptysis, dysphasia, odynophagia, melena, hematochezia, dysuria, hematuria, rash, seizure activity, orthopnea, PND, pedal edema, claudication. Remaining systems are  negative.  Physical Exam: Well-developed well-nourished in no acute distress.  Skin is warm and dry.  HEENT is normal.  Neck is supple.  Chest is clear to auscultation with normal expansion.  Cardiovascular exam is regular rate and rhythm.  Abdominal exam nontender or distended. No masses palpated. Extremities show no edema. neuro grossly intact  ECG- personally reviewed  A/P  1 aortic insufficiency-patient will need follow-up echocardiogram September 2021.  2 hypertension-blood pressure controlled.  Continue present medications.  3 PVCs-continue beta-blockade.  4 pacemaker-[facility].  5 Atrial fibrillation-noted on device interrogation previously.  Kirk Ruths, MD

## 2020-11-23 ENCOUNTER — Telehealth: Payer: Medicare Other | Admitting: Physician Assistant

## 2020-11-23 ENCOUNTER — Encounter: Payer: Self-pay | Admitting: Physician Assistant

## 2020-11-23 ENCOUNTER — Telehealth: Payer: Self-pay | Admitting: *Deleted

## 2020-11-23 DIAGNOSIS — Z20822 Contact with and (suspected) exposure to covid-19: Secondary | ICD-10-CM | POA: Diagnosis not present

## 2020-11-23 DIAGNOSIS — R54 Age-related physical debility: Secondary | ICD-10-CM

## 2020-11-23 MED ORDER — ALBUTEROL SULFATE HFA 108 (90 BASE) MCG/ACT IN AERS: 2.0000 | INHALATION_SPRAY | Freq: Four times a day (QID) | RESPIRATORY_TRACT | 0 refills | Status: AC | PRN

## 2020-11-23 MED ORDER — BENZONATATE 100 MG PO CAPS: 100.0000 mg | ORAL_CAPSULE | Freq: Three times a day (TID) | ORAL | 0 refills | Status: DC | PRN

## 2020-11-23 NOTE — Progress Notes (Signed)
Ms. rowena, moilanen are scheduled for a virtual visit with your provider today.    Just as we do with appointments in the office, we must obtain your consent to participate.  Your consent will be active for this visit and any virtual visit you may have with one of our providers in the next 365 days.    If you have a MyChart account, I can also send a copy of this consent to you electronically.  All virtual visits are billed to your insurance company just like a traditional visit in the office.  As this is a virtual visit, video technology does not allow for your provider to perform a traditional examination.  This may limit your provider's ability to fully assess your condition.  If your provider identifies any concerns that need to be evaluated in person or the need to arrange testing such as labs, EKG, etc, we will make arrangements to do so.    Although advances in technology are sophisticated, we cannot ensure that it will always work on either your end or our end.  If the connection with a video visit is poor, we may have to switch to a telephone visit.  With either a video or telephone visit, we are not always able to ensure that we have a secure connection.   I need to obtain your verbal consent now.   Are you willing to proceed with your visit today?   JENNAVIEVE ARRICK has provided verbal consent on 11/23/2020 for a virtual visit (video or telephone).  Leeanne Rio, PA-C 11/23/2020  11:13 AM  Virtual Visit via Video   I connected with patient on 11/23/20 at 11:15 AM EDT by a video enabled telemedicine application and verified that I am speaking with the correct person using two identifiers.  Location patient: Home Location provider: [place] participating in the virtual visit: Patient, Provider  I discussed the limitations of evaluation and management by telemedicine and the availability of in person appointments. The patient expressed understanding and agreed to  proceed.  Subjective:   HPI:  Patient presents via [facility] today with her daughter c/o 1-1/2 days of URI symptoms including cough, fatigue and significant body aches.  Patient and daughter endorse symptoms have been gradually worsening since onset.  They deny any chest pain or overt shortness of breath despite her history of asthma.  Denies any sinus pain, ear pain or tooth pain.  Denies any nausea or vomiting.  Some decreased appetite.  Is able to remain hydrated.  Of note patient and daughter note grandson was sick early last week had a negative COVID test however.  Because of this they will travel together to and from [place], spending 11 hours each way in the car.  Noted on return trip, grandson was doing worse and had another COVID test which was positive.  They are highly concerned about Inda having COVID, especially given her age and significant medical history including lymphoma and asthma.  In note Madhavi has had her initial COVID shot and first booster, having finished the series in August 2021.  Of note, daughter has been keeping a check on her old oxygen saturation, averaging around 94 to 96% at rest the past 2 days.   ROS:   See pertinent positives and negatives per HPI.  Patient Active Problem List   Diagnosis Date Noted  . Heart block 04/07/2020  . Pacemaker 04/07/2020  . Syncope 03/28/2020  . Shingles 10/24/2019  . MTHFR gene  mutation 10/09/2018  . Primary localized osteoarthritis of left knee 09/24/2018  . S/P left unicompartmental knee replacement 09/24/2018  . Fatigue 09/03/2018  . Myalgia 09/03/2018  . Vertigo 09/03/2018  . Sebaceous cyst 09/03/2018  . Callus of foot 09/03/2018  . Primary localized osteoarthritis of right knee 06/18/2018  . Status post right partial knee replacement 06/18/2018  . Nasal polyposis 05/16/2018  . Class 1 obesity due to excess calories with serious comorbidity and body mass index (BMI) of 30.0 to 30.9 in adult 07/16/2017  . DDD  (degenerative disc disease), cervical 07/16/2017  . GERD (gastroesophageal reflux disease) 07/16/2017  . Other hyperlipidemia 09/27/2016  . Paroxysmal atrial fibrillation ([place]) 09/27/2016  . Benign essential hypertension 09/15/2015  . Follicular lymphoma ([place]) 12/18/2014  . Lymphoma ([place]) 07/28/2014  . Hypothyroidism 01/14/2014  . Acquired trigger finger 08/05/2013  . Carpal tunnel syndrome on both sides 06/30/2013  . Dyspnea 12/25/2012    Social History   Tobacco Use  . Smoking status: Never Smoker  . Smokeless tobacco: Never Used  Substance Use Topics  . Alcohol use: No    Current Outpatient Medications:  .  amLODipine (NORVASC) 5 MG tablet, TAKE 1 TABLET BY MOUTH  DAILY, Disp: 90 tablet, Rfl: 3 .  aspirin EC 81 MG tablet, Take 81 mg by mouth daily., Disp: , Rfl:  .  baclofen (LIORESAL) 10 MG tablet, Take 1 tablet (10 mg total) by mouth 3 (three) times daily as needed (muscle spasms)., Disp: 270 tablet, Rfl: 0 .  fluticasone (FLOVENT HFA) 110 MCG/ACT inhaler, Inhale 2 puffs into the lungs 2 (two) times daily as needed (for shortness of breath or wheezing)., Disp: 3 Inhaler, Rfl: 3 .  gabapentin (NEURONTIN) 100 MG capsule, TAKE 1 CAPSULE BY MOUTH  TWICE DAILY AS NEEDED, Disp: 180 capsule, Rfl: 2 .  hydrochlorothiazide (HYDRODIURIL) 12.5 MG tablet, Take 1 tablet (12.5 mg total) by mouth daily., Disp: 30 tablet, Rfl: 2 .  hydroxychloroquine (PLAQUENIL) 200 MG tablet, Take 200 mg by mouth 2 (two) times daily. , Disp: , Rfl:  .  isosorbide mononitrate (IMDUR) 30 MG 24 hr tablet, Take 1 tablet (30 mg total) by mouth daily., Disp: 90 tablet, Rfl: 1 .  levothyroxine (SYNTHROID) 112 MCG tablet, TAKE 1 TABLET BY MOUTH  DAILY BEFORE BREAKFAST, Disp: 90 tablet, Rfl: 1 .  losartan (COZAAR) 100 MG tablet, Take 1 tablet (100 mg total) by mouth at bedtime., Disp: 90 tablet, Rfl: 3 .  meclizine (ANTIVERT) 25 MG tablet, Take 1 tablet (25 mg total) by mouth 3 (three) times daily as needed for  dizziness., Disp: 30 tablet, Rfl: 0 .  meloxicam (MOBIC) 15 MG tablet, Take 15 mg by mouth daily as needed for pain., Disp: , Rfl:  .  metoprolol tartrate (LOPRESSOR) 50 MG tablet, Take 50 mg by mouth 2 (two) times daily., Disp: , Rfl:  .  mometasone (ELOCON) 0.1 % cream, Apply 1 application topically daily. To vaginal area as needed for redness/irritation., Disp: 100 g, Rfl: 3 .  omeprazole (PRILOSEC) 40 MG capsule, TAKE 1 CAPSULE BY MOUTH IN  THE MORNING AND MAY REPEAT  DOSE IN THE EVENING AS  NEEDED FOR ACID REFLUX /  INDIGESTION, Disp: 180 capsule, Rfl: 3 .  ondansetron (ZOFRAN) 4 MG tablet, Take 1 tablet (4 mg total) by mouth every 8 (eight) hours as needed for nausea or vomiting., Disp: 10 tablet, Rfl: 0 .  pravastatin (PRAVACHOL) 80 MG tablet, TAKE 1 TABLET BY MOUTH AT  BEDTIME, Disp: 90  tablet, Rfl: 3 .  sennosides-docusate sodium (SENOKOT-S) 8.6-50 MG tablet, Take 2 tablets by mouth daily., Disp: 30 tablet, Rfl: 1 .  tetrahydrozoline 0.05 % ophthalmic solution, Place 2 drops into both eyes daily as needed (for dry eyes)., Disp: , Rfl:  .  triamcinolone (NASACORT ALLERGY 24HR) 55 MCG/ACT AERO nasal inhaler, Place 2 sprays into the nose daily., Disp: 3 Inhaler, Rfl: 3  No Known Allergies  Objective:   There were no vitals taken for this visit.  Patient is well-developed, well-nourished in no acute distress.  Resting comfortably on bed at home.  Head is normocephalic, atraumatic.  No labored breathing.  Speech is clear and coherent with logical content.  Patient is alert and oriented at baseline.  Patient able to talk in complete sentences without any audible wheezing or shortness of breath.  Assessment and Plan:   1. Close exposure to COVID-19 virus 2. Suspected COVID-19 virus infection 3. Advanced age Patient exposed to her grandson who is COVID-positive, spending many hours in the car ride from [place].  Now with symptoms.  Thankfully thus far symptoms are milder except for her  level of fatigue.  O2 saturations greater than 90%.  Instructed daughter to keep a check on these and to check this periodically when patient is up and moving around (i.e. going to the bathroom, etc.).  We will have her continue to stay well-hydrated.  Continue Flovent per usual.  Refill her albuterol inhaler sent to pharmacy.  Rx Tessalon to help with cough.  Okay to continue use of DayQuil and NyQuil.  She has been instructed to start a regimen of vitamin C, vitamin D and zinc.  Patient enrolled in MyChart symptom monitoring program.  She is being sent for a PCR test for COVID.  Even though she has not had a positive test yet, unlikely this is anything else and, especially giving her comorbid conditions, will refer her directly to the COVID treatment team for initiation of further therapies.  Discussed with patient and her daughter that I have a very low threshold for her being seen in the emergency room.  Strict ER precautions discussed.  They voiced understanding and agreement with the plan.  Written copy of instructions was provided to the patient via [place].  Copy of this note will be sent to her primary care provider upon completion of this documentation.    Leeanne Rio, PA-C 11/23/2020

## 2020-11-23 NOTE — Patient Instructions (Signed)
Instructions sent to patients MyChart.

## 2020-11-23 NOTE — Telephone Encounter (Signed)
Pt's COVID questionnaire response on 11/23/20 triggered BPA; advice sent to pt via [place] "Thank you for your reply.  Please continue to monitor your symptoms and follow your care plan as discussed in your video visit on 11/23/20"; will route to PCP for notification.

## 2020-11-24 ENCOUNTER — Encounter (INDEPENDENT_AMBULATORY_CARE_PROVIDER_SITE_OTHER): Payer: Self-pay

## 2020-11-24 ENCOUNTER — Telehealth: Payer: Self-pay

## 2020-11-24 NOTE — Telephone Encounter (Signed)
Patient states that she did a tele visit yesterday and was given a medication. She will pick up the script today. Patient advise on worsening cough per protocol. Patient verbalized understanding .   If cough remains the same or better: continue to treat with over the counter medications. Hard candy or cough drops and drinking warm fluids. Adults can also use honey 2 tsp (10 ML) at bedtime.   HONEY IS NOT RECOMMENDED FOR INFANTS UNDER ONE.    If cough is becoming worse even with the use of over the counter medications and patient is not able to sleep at night, cough becomes productive with sputum that maybe yellow or green in color, contact PCP.

## 2020-11-24 NOTE — Telephone Encounter (Signed)
Called to discuss with patient about COVID-19 symptoms and the use of one of the available treatments for those with mild to moderate Covid symptoms and at a high risk of hospitalization.  Pt appears to qualify for outpatient treatment due to co-morbid conditions and/or a member of an at-risk group in accordance with the FDA Emergency Use Authorization.    Symptom onset: 11/22/20 Cough Vaccinated: Yes Booster? Yes Immunocompromised? No Qualifiers: Lymphoma,Asthma NIH Criteria: Tier 1  Unable to reach pt - Left message and call back number [phone].   [name]

## 2020-11-25 ENCOUNTER — Other Ambulatory Visit: Payer: Self-pay | Admitting: Physician Assistant

## 2020-11-25 ENCOUNTER — Telehealth: Payer: Self-pay

## 2020-11-25 ENCOUNTER — Other Ambulatory Visit (HOSPITAL_COMMUNITY): Payer: Self-pay

## 2020-11-25 MED ORDER — NIRMATRELVIR/RITONAVIR (PAXLOVID) TABLET (RENAL DOSING)
2.0000 | ORAL_TABLET | Freq: Two times a day (BID) | ORAL | 0 refills | Status: AC
  Filled 2020-11-25: qty 20, 5d supply, fill #0

## 2020-11-25 NOTE — Progress Notes (Signed)
Outpatient Oral COVID Treatment Note  I connected with [name] on 11/25/2020/10:01 AM by telephone and verified that I am speaking with the correct person using two identifiers.  I discussed the limitations, risks, security, and privacy concerns of performing an evaluation and management service by telephone and the availability of in person appointments. I also discussed with the patient that there may be a patient responsible charge related to this service. The patient expressed understanding and agreed to proceed.  Patient location: Home Provider location: Home  Diagnosis: COVID-19 infection  Purpose of visit: Discussion of potential use of Molnupiravir or Paxlovid, a new treatment for mild to moderate COVID-19 viral infection in non-hospitalized patients.   Subjective: Patient is a 86 y.o. female who has been diagnosed with COVID 19 viral infection.  Their symptoms began on 5/16 with fever, cough and sore throat.    Past Medical History:  Diagnosis Date  . Arthritis   . Asthma   . Atrial fibrillation (HCC)    paroxysmal , addressed  with metoprolol   . Chronic sinusitis   . Complication of anesthesia   . Dyspnea    sob on exertion, reports at her pre-op today 09-13-2018 that her cardiologist has planned for her to do a ECHO for evaluation   . Family history of adverse reaction to anesthesia    sister is hard to wake up from Anesthesia  . Follicular lymphoma ([facility])    received Chemotherapy (2 years ago)  and radiation (last 12/2017)  . Hyperlipidemia   . Hypertension   . Hypothyroidism   . Meniere's disease   . PONV (postoperative nausea and vomiting)   . Primary localized osteoarthritis of left knee 09/24/2018  . Primary localized osteoarthritis of right knee 06/18/2018  . Reflux     No Known Allergies   Current Outpatient Medications:  .  albuterol (VENTOLIN HFA) 108 (90 Base) MCG/ACT inhaler, Inhale 2 puffs into the lungs every 6 (six) hours as needed for wheezing or  shortness of breath., Disp: 8 g, Rfl: 0 .  amLODipine (NORVASC) 5 MG tablet, TAKE 1 TABLET BY MOUTH  DAILY, Disp: 90 tablet, Rfl: 3 .  aspirin EC 81 MG tablet, Take 81 mg by mouth daily., Disp: , Rfl:  .  baclofen (LIORESAL) 10 MG tablet, Take 1 tablet (10 mg total) by mouth 3 (three) times daily as needed (muscle spasms)., Disp: 270 tablet, Rfl: 0 .  benzonatate (TESSALON) 100 MG capsule, Take 1 capsule (100 mg total) by mouth 3 (three) times daily as needed for cough., Disp: 30 capsule, Rfl: 0 .  fluticasone (FLOVENT HFA) 110 MCG/ACT inhaler, Inhale 2 puffs into the lungs 2 (two) times daily as needed (for shortness of breath or wheezing)., Disp: 3 Inhaler, Rfl: 3 .  gabapentin (NEURONTIN) 100 MG capsule, TAKE 1 CAPSULE BY MOUTH  TWICE DAILY AS NEEDED, Disp: 180 capsule, Rfl: 2 .  hydrochlorothiazide (HYDRODIURIL) 12.5 MG tablet, Take 1 tablet (12.5 mg total) by mouth daily., Disp: 30 tablet, Rfl: 2 .  hydroxychloroquine (PLAQUENIL) 200 MG tablet, Take 200 mg by mouth 2 (two) times daily. , Disp: , Rfl:  .  isosorbide mononitrate (IMDUR) 30 MG 24 hr tablet, Take 1 tablet (30 mg total) by mouth daily., Disp: 90 tablet, Rfl: 1 .  levothyroxine (SYNTHROID) 112 MCG tablet, TAKE 1 TABLET BY MOUTH  DAILY BEFORE BREAKFAST, Disp: 90 tablet, Rfl: 1 .  losartan (COZAAR) 100 MG tablet, Take 1 tablet (100 mg total) by mouth at bedtime.,  Disp: 90 tablet, Rfl: 3 .  meclizine (ANTIVERT) 25 MG tablet, Take 1 tablet (25 mg total) by mouth 3 (three) times daily as needed for dizziness., Disp: 30 tablet, Rfl: 0 .  meloxicam (MOBIC) 15 MG tablet, Take 15 mg by mouth daily as needed for pain., Disp: , Rfl:  .  metoprolol tartrate (LOPRESSOR) 50 MG tablet, Take 50 mg by mouth 2 (two) times daily., Disp: , Rfl:  .  mometasone (ELOCON) 0.1 % cream, Apply 1 application topically daily. To vaginal area as needed for redness/irritation., Disp: 100 g, Rfl: 3 .  omeprazole (PRILOSEC) 40 MG capsule, TAKE 1 CAPSULE BY MOUTH IN   THE MORNING AND MAY REPEAT  DOSE IN THE EVENING AS  NEEDED FOR ACID REFLUX /  INDIGESTION, Disp: 180 capsule, Rfl: 3 .  ondansetron (ZOFRAN) 4 MG tablet, Take 1 tablet (4 mg total) by mouth every 8 (eight) hours as needed for nausea or vomiting., Disp: 10 tablet, Rfl: 0 .  pravastatin (PRAVACHOL) 80 MG tablet, TAKE 1 TABLET BY MOUTH AT  BEDTIME, Disp: 90 tablet, Rfl: 3 .  sennosides-docusate sodium (SENOKOT-S) 8.6-50 MG tablet, Take 2 tablets by mouth daily., Disp: 30 tablet, Rfl: 1 .  tetrahydrozoline 0.05 % ophthalmic solution, Place 2 drops into both eyes daily as needed (for dry eyes)., Disp: , Rfl:  .  triamcinolone (NASACORT ALLERGY 24HR) 55 MCG/ACT AERO nasal inhaler, Place 2 sprays into the nose daily., Disp: 3 Inhaler, Rfl: 3  Objective: Patient appears/sounds sick.  They are in no apparent distress.  Breathing is non labored.  Mood and behavior are normal.  Laboratory Data:  Recent Results (from the past 2160 hour(s))  COMPLETE METABOLIC PANEL WITH GFR     Status: Abnormal   Collection Time: 09/08/20 12:00 AM  Result Value Ref Range   Glucose, Bld 88 65 - 99 mg/dL    Comment: .            Fasting reference interval .    BUN 25 7 - 25 mg/dL   Creat 1.17 (H) 0.60 - 0.88 mg/dL    Comment: For patients >86 years of age, the reference limit for Creatinine is approximately 13% higher for people identified as African-American. .    GFR, Est Non African American 43 (L) > OR = 60 mL/min/1.85m2   GFR, Est African American 50 (L) > OR = 60 mL/min/1.48m2   BUN/Creatinine Ratio 21 6 - 22 (calc)   Sodium 136 135 - 146 mmol/L   Potassium 4.4 3.5 - 5.3 mmol/L   Chloride 98 98 - 110 mmol/L   CO2 30 20 - 32 mmol/L   Calcium 9.7 8.6 - 10.4 mg/dL   Total Protein 7.0 6.1 - 8.1 g/dL   Albumin 4.4 3.6 - 5.1 g/dL   Globulin 2.6 1.9 - 3.7 g/dL (calc)   AG Ratio 1.7 1.0 - 2.5 (calc)   Total Bilirubin 1.3 (H) 0.2 - 1.2 mg/dL   Alkaline phosphatase (APISO) 54 37 - 153 U/L   AST 17 10 - 35 U/L    ALT 13 6 - 29 U/L  TSH     Status: None   Collection Time: 09/08/20 12:00 AM  Result Value Ref Range   TSH 3.06 0.40 - 4.50 mIU/L  CBC     Status: None   Collection Time: 09/08/20 12:00 AM  Result Value Ref Range   WBC 5.8 3.8 - 10.8 Thousand/uL   RBC 4.80 3.80 - 5.10 Million/uL   Hemoglobin 13.6  11.7 - 15.5 g/dL   HCT 40.9 35.0 - 45.0 %   MCV 85.2 80.0 - 100.0 fL   MCH 28.3 27.0 - 33.0 pg   MCHC 33.3 32.0 - 36.0 g/dL   RDW 12.9 11.0 - 15.0 %   Platelets 240 140 - 400 Thousand/uL   MPV 10.7 7.5 - 12.5 fL  Urine Culture     Status: None   Collection Time: 09/08/20 10:21 AM   Specimen: Urine  Result Value Ref Range   MICRO NUMBER: [id number]    SPECIMEN QUALITY: Adequate    Sample Source NOT GIVEN    STATUS: FINAL    Result: No Growth   Urinalysis, Routine w reflex microscopic     Status: None   Collection Time: 09/08/20 10:21 AM  Result Value Ref Range   Color, Urine YELLOW YELLOW   APPearance CLEAR CLEAR   Specific Gravity, Urine 1.012 1.001 - 1.03   pH 5.5 5.0 - 8.0   Glucose, UA NEGATIVE NEGATIVE   Bilirubin Urine NEGATIVE NEGATIVE   Ketones, ur NEGATIVE NEGATIVE   Hgb urine dipstick NEGATIVE NEGATIVE   Protein, ur NEGATIVE NEGATIVE   Nitrite NEGATIVE NEGATIVE   Leukocytes,Ua NEGATIVE NEGATIVE  POCT URINALYSIS DIP (CLINITEK)     Status: None   Collection Time: 09/08/20 10:42 AM  Result Value Ref Range   Color, UA yellow yellow   Clarity, UA clear clear   Glucose, UA negative negative mg/dL   Bilirubin, UA negative negative   Ketones, POC UA negative negative mg/dL   Spec Grav, UA 1.015 1.010 - 1.025   Blood, UA negative negative   pH, UA 5.5 5.0 - 8.0   POC PROTEIN,UA negative negative, trace   Urobilinogen, UA 0.2 0.2 or 1.0 E.U./dL   Nitrite, UA Negative Negative   Leukocytes, UA Negative Negative     Assessment: 86 y.o. female with mild/moderate COVID 19 viral infection diagnosed on 5/18  at high risk for progression to severe COVID 19.  Plan:  This  patient is a 86 y.o. female that meets the following criteria for Emergency Use Authorization of: Paxlovid 1. Age >12 yr AND > 40 kg 2. SARS-COV-2 positive test 3. Symptom onset < 5 days 4. Mild-to-moderate COVID disease with high risk for severe progression to hospitalization or death  I have spoken and communicated the following to the patient or parent/caregiver regarding: 1. Paxlovid is an unapproved drug that is authorized for use under an Emergency Use Authorization.  2. There are no adequate, approved, available products for the treatment of COVID-19 in adults who have mild-to-moderate COVID-19 and are at high risk for progressing to severe COVID-19, including hospitalization or death. 3. Other therapeutics are currently authorized. For additional information on all products authorized for treatment or prevention of COVID-19, please see [email or web address].  4. There are benefits and risks of taking this treatment as outlined in the "Fact Sheet for Patients and Caregivers."  5. "Fact Sheet for Patients and Caregivers" was reviewed with patient. A hard copy will be provided to patient from pharmacy prior to the patient receiving treatment. 6. Patients should continue to self-isolate and use infection control measures (e.g., wear mask, isolate, social distance, avoid sharing personal items, clean and disinfect "high touch" surfaces, and frequent handwashing) according to CDC guidelines.  7. The patient or parent/caregiver has the option to accept or refuse treatment. 8. Patient medication history was reviewed for potential drug interactions:Interaction with home meds: Amlodipine, Flonase, Triamcinolol  (will hold) 9. Patient's GFR  was calculated to be less than 60, and they were therefore prescribed Reduced dose (GFR 30-60) - nirmatrelvir 150mg  tab (1 tablet) by mouth twice daily AND ritonavir  100mg  tab (1 tablet) by mouth twice daily   After reviewing above information with the patient, the patient agrees to receive Paxlovid.  Follow up instructions:    . Take prescription BID x 5 days as directed . Reach out to pharmacist for counseling on medication if desired . For concerns regarding further COVID symptoms please follow up with your PCP or urgent care . For urgent or life-threatening issues, seek care at your local emergency department  The patient was provided an opportunity to ask questions, and all were answered. The patient agreed with the plan and demonstrated an understanding of the instructions.   Script sent to [facility] and opted to pick up RX.  The patient was advised to call their PCP or seek an in-person evaluation if the symptoms worsen or if the condition fails to improve as anticipated.   I provided 20 minutes of non face-to-face telephone visit time during this encounter, and > 50% was spent counseling as documented under my assessment & plan.  [place], [place] 11/25/2020 /10:01 AM

## 2020-11-25 NOTE — Telephone Encounter (Signed)
Questionnaire indicates that diarrhea and weakness is worse today. 1: HOME CARE:  * You should be able to treat this at home.    2: REASSURANCE AND EDUCATION - DIARRHEA:  * Diarrhea may caused by a virus ('stomach flu') or a bacteria. Diarrhea is one of the body's way of getting rid of germs.  * Certain foods (e.g., dairy products, supplements like Ensure) can also trigger diarrhea.  * In some people, the exact cause is never found.  * Staying well-hydrated is the most important thing if you have diarrhea. From what you have told me, it sounds like you are not severely dehydrated at this point.  * Here is some general care advice that should help.   3: FLUID THERAPY DURING MILD TO MODERATE DIARRHEA:  * Drink more fluids, at least 8 to 10 cups daily. One cup equals 8 oz (240 ml).  * WATER: For mild to moderate diarrhea, water is often the best liquid to drink. You should also eat some salty foods (e.g., potato chips, pretzels, saltine crackers). This is important to make sure you are getting enough salt, sugars, and fluids to meet your body's needs.  * SPORTS DRINKS: You can also drink half-strength sports drinks (e.g., Gatorade, Powerade) to help treat and prevent dehydration. Mix the sports drink half and half with water.  * Avoid caffeinated beverages. Reason: Caffeine is mildly dehydrating.  * Avoid alcohol beverages (e.g., beer, wine, hard liquor).  * Avoid carbonated soft drinks (soda) as these can make your diarrhea worse.   4: FOOD AND NUTRITION DURING MILD TO MODERATE DIARRHEA:  * Maintaining some food intake during episodes of diarrhea is important.  * Begin with boiled starches / cereals (e.g., potatoes, rice, noodles, wheat, oats) with a small amount of salt to taste.  * You can also eat bananas, yogurt, crackers, soup.  * Eat smaller meals and snacks more often during the day rather than 3 larger meals.  * As the diarrhea starts to get better, you can slowly return to a normal diet.   * AVOID milk and dairy products if these make your diarrhea worse.  * AVOID greasy, fatty or spicy foods.   5: DIARRHEA MEDICINE - LOPERAMIDE (IMODIUM AD):  * This medicine helps decrease diarrhea. It is available over-the-counter (OTC) in a drugstore.  * Adult dosage: 4 mg (2 capsules) is the recommended first dose. You may take an additional 2 mg (1 capsule) after each loose stool.  * Maximum dosage: 16 mg per day (8 capsules).  * Do not use for more than 2 days.   6: DIARRHEA MEDICINE - LOPERAMIDE - EXTRA NOTES AND WARNINGS:  * DO NOT use if there is a fever over 100.4 F (38.0 C) or if there is blood or mucus in the stools.  * DO NOT drink tonic water. It can interact with loperamide and may cause a serious heart problems.  * Before taking any medicine, read all the instructions on the package.   7: DIARRHEA MEDICINE - BISMUTH SUBSALICYLATE (E.G., KAOPECTATE, PEPTO-BISMOL):  * This medicine can help reduce diarrhea, vomiting, and abdominal cramping. It is available over-the-counter (OTC) in a drugstore.  * Adult dosage: Take two tablets or two tablespoons by mouth every hour (if diarrhea continues) to a maximum of 8 doses in a 24 hour period.  * Do not use for more than 2 days.   8: DIARRHEA MEDICINE - BISMUTH SUBSALICYLATE - EXTRA NOTES AND WARNINGS:  * May cause a  temporary darkening of stool and tongue.  * Do not use if allergic to aspirin.  * Do not use in pregnancy.  * Before taking any medicine, read all the instructions on the package.   9: CONTAGIOUSNESS:  * Wash your hands after using the bathroom.  * Wash your hands before fixing or eating food.  * If your work is cooking, Research scientist (medical), serving, or preparing food, then you should not work until Do he diarrhea has completely stopped. Check with your employer before going back to work.  * Wash soiled towels, sheets, or clothes separately.  * Do not share towels or sheets.  * Do not swim for 2 weeks after diarrhea is gone.   10:  EXPECTED COURSE:  * Viral diarrhea lasts 4 to 7 days.  * It is usually worse on days 1 and 2.   11: CALL BACK IF:  * Signs of dehydration occur (e.g., no urine over 12 hours, very dry mouth, lightheaded, etc.)  * Diarrhea lasts over 7 days  * You become worse   12: CARE ADVICE given per Diarrhea (Adult) guideline.   No    [1] MILD diarrhea AND [2] taking antibiotics   Yes Home Care   Care Advice:    1: HOME CARE:  * You should be able to treat this at home.   2: REASSURANCE AND EDUCATION - DIARRHEA WHILE TAKING AN ANTIBIOTIC:  * It is common to have mild diarrhea when taking an antibiotic.  * Diarrhea is not an allergic reaction to the antibiotic.  * Antibiotics can upset the natural balance of bacteria (normal flora) in the gut (intestines).  * Here is some care advice that should help.   3: FLUID THERAPY DURING MILD TO MODERATE DIARRHEA:  * Drink more fluids, at least 8 to 10 cups daily. One cup equals 8 oz (240 ml).  * WATER: For mild to moderate diarrhea, water is often the best liquid to drink. You should also eat some salty foods (e.g., potato chips, pretzels, saltine crackers). This is important to make sure you are getting enough salt, sugars, and fluids to meet your body's needs.  * SPORTS DRINKS: You can also drink half-strength sports drinks (e.g., Gatorade, Powerade) to help treat and prevent dehydration. Mix the sports drink half and half with water.  * Avoid caffeinated beverages. Reason: Caffeine is mildly dehydrating.  * Avoid alcohol beverages (e.g., beer, wine, hard liquor).  * Avoid carbonated soft drinks (soda) as these can make your diarrhea worse.   4: FOOD AND NUTRITION DURING MILD TO MODERATE DIARRHEA:  * Maintaining some food intake during episodes of diarrhea is important.  * Begin with boiled starches / cereals (e.g., potatoes, rice, noodles, wheat, oats) with a small amount of salt to taste.  * You can also eat bananas, yogurt, crackers, soup.  * Eat smaller  meals and snacks more often during the day rather than 3 larger meals.  * As the diarrhea starts to get better, you can slowly return to a normal diet.  * AVOID milk and dairy products if these make your diarrhea worse.  * AVOID greasy, fatty or spicy foods.   5: PROBIOTIC SUPPLEMENTS:  * Probiotic supplements contain healthy bacteria (such as Lactobacillis). They help provide good bacteria for gut (intestines).  * Probiotic supplements are available in drugstores and health food stores.  * They come in tablets, capsules, and granules. You can mix the granules with food or drinks.  * Before taking  any medicine, read all the instructions on the package.   6: YOGURT AND KEFIR:  * Consider eating yogurt twice a day. Yogurt contains probiotics (good bacteria for the gut).  * Choose a brand that has 'active cultures.'  * Another option is Kefir. Kefir is a fermented beverage that tastes like a yogurt drink. It has a higher amount of probiotic cultures than yogurt.   7: CALL BACK IF:  * Signs of dehydration occur (e.g., no urine over 12 hours, very dry mouth, lightheaded, etc.)  * Diarrhea lasts more than 3 days after finishing antibiotic  * Diarrhea becomes severe  * You become worse   8: CARE ADVICE given per Diarrhea (Adult) guideline.   No    Will notify PCP of any changes.

## 2020-11-26 ENCOUNTER — Telehealth: Payer: Self-pay

## 2020-11-26 ENCOUNTER — Ambulatory Visit: Payer: Medicare Other | Admitting: Cardiology

## 2020-11-26 ENCOUNTER — Ambulatory Visit: Payer: Medicare Other | Admitting: *Deleted

## 2020-11-26 ENCOUNTER — Other Ambulatory Visit: Payer: Self-pay | Admitting: Oncology

## 2020-11-26 DIAGNOSIS — U071 COVID-19: Secondary | ICD-10-CM

## 2020-11-26 MED ORDER — EPINEPHRINE 0.3 MG/0.3ML IJ SOAJ: 0.3000 mg | Freq: Once | INTRAMUSCULAR | Status: AC | PRN

## 2020-11-26 MED ORDER — DIPHENHYDRAMINE HCL 50 MG/ML IJ SOLN: 50.0000 mg | Freq: Once | INTRAMUSCULAR | Status: AC | PRN

## 2020-11-26 MED ORDER — BEBTELOVIMAB 175 MG/2 ML IV (EUA)
175.0000 mg | Freq: Once | INTRAMUSCULAR | Status: AC
  Administered 2020-11-26: 175 mg via INTRAVENOUS

## 2020-11-26 MED ORDER — METHYLPREDNISOLONE SODIUM SUCC 125 MG IJ SOLR: 125.0000 mg | Freq: Once | INTRAMUSCULAR | Status: AC | PRN

## 2020-11-26 MED ORDER — FAMOTIDINE IN NACL 20-0.9 MG/50ML-% IV SOLN: 20.0000 mg | Freq: Once | INTRAVENOUS | Status: AC | PRN

## 2020-11-26 MED ORDER — ALBUTEROL SULFATE HFA 108 (90 BASE) MCG/ACT IN AERS: 2.0000 | INHALATION_SPRAY | Freq: Once | RESPIRATORY_TRACT | Status: AC | PRN

## 2020-11-26 MED ORDER — SODIUM CHLORIDE 0.9 % IV SOLN: INTRAVENOUS | Status: DC | PRN

## 2020-11-26 NOTE — Progress Notes (Signed)
Diagnosis: COVID  Provider:  Marshell Garfinkel, MD  Procedure: Infusion  IV Type: Peripheral, IV Location: L Antecubital  Bebtelovimab, Dose: 175 mg  Infusion Start Time: 1350  Infusion Stop Time: 1450  Post Infusion IV Care: Observation period completed  Discharge: Condition: Good, Destination: Home . AVS provided to patient.   Performed by:  Oren Beckmann, RN

## 2020-11-26 NOTE — Telephone Encounter (Signed)
Pt. Reports on questionnaire diarrhea and cough are worse today. Left message to call back about symptoms.

## 2020-12-02 ENCOUNTER — Telehealth: Payer: Self-pay | Admitting: *Deleted

## 2020-12-02 NOTE — Telephone Encounter (Signed)
Call to patient due to change in symptoms: fatigue and nausea. Patient states she was diagnosed with COVID

## 2020-12-02 NOTE — Telephone Encounter (Signed)
Call to patient due to change in symptoms: fatigue and nausea.  Patient states she was diagnosed with COVID around 5/17- patient has received infusion and oral treatment for her symptoms. Patient states she seemed to get better and then the fatigue came back. Patient states she has experience nausea today - no vomiting. Daughter is with patient and main concern is fatigue. Advised symptoms can linger for 2-6 weeks after COVID diagnosis.  Advised patient to: Make sure she is hydrating well.                              Build on diet- if nauseated- try to eat bland diet, small meals more often.                               Rest as needed- may take time. Fatigue is real and can be contributed to by: low levels of activity, low mood- stress and anxiety, disturbed daily activity, poor sleep patterns can all contribute. Speak to provider if fatigue is getting worse rather than better. Call dropped due to computer/EPIC disconnect during call- information also sent in MyChart-.

## 2020-12-02 NOTE — Telephone Encounter (Signed)
Call to patient due to increased symptoms: weakness and vomiting.

## 2020-12-03 ENCOUNTER — Ambulatory Visit (INDEPENDENT_AMBULATORY_CARE_PROVIDER_SITE_OTHER): Payer: Medicare Other

## 2020-12-03 ENCOUNTER — Encounter: Payer: Self-pay | Admitting: Medical-Surgical

## 2020-12-03 ENCOUNTER — Ambulatory Visit (INDEPENDENT_AMBULATORY_CARE_PROVIDER_SITE_OTHER): Payer: Medicare Other | Admitting: Medical-Surgical

## 2020-12-03 ENCOUNTER — Other Ambulatory Visit: Payer: Self-pay

## 2020-12-03 VITALS — BP 134/70 | HR 83 | Temp 98.1°F | Ht 65.0 in

## 2020-12-03 DIAGNOSIS — R071 Chest pain on breathing: Secondary | ICD-10-CM

## 2020-12-03 DIAGNOSIS — R21 Rash and other nonspecific skin eruption: Secondary | ICD-10-CM | POA: Diagnosis not present

## 2020-12-03 DIAGNOSIS — J0100 Acute maxillary sinusitis, unspecified: Secondary | ICD-10-CM

## 2020-12-03 DIAGNOSIS — J3489 Other specified disorders of nose and nasal sinuses: Secondary | ICD-10-CM | POA: Diagnosis not present

## 2020-12-03 DIAGNOSIS — R079 Chest pain, unspecified: Secondary | ICD-10-CM | POA: Diagnosis not present

## 2020-12-03 DIAGNOSIS — J323 Chronic sphenoidal sinusitis: Secondary | ICD-10-CM | POA: Diagnosis not present

## 2020-12-03 MED ORDER — AZITHROMYCIN 250 MG PO TABS: ORAL_TABLET | ORAL | 0 refills | Status: AC

## 2020-12-03 NOTE — Progress Notes (Signed)
Subjective:    CC: chest pain on breathing, maxillary facial pain  HPI: Pleasant 86-year-old female accompanied by her daughter presenting today for evaluation of wheezing and chest pain on breathing after being positive for COVID 2 weeks ago.  Notes that when she takes a deep breath, she has pain under the right shoulder blade.  She is still coughing which is productive of white-gray sputum.  She is taking Mucinex to help with this and notes that the medication does help get the secretions up.  Denies fever, chills, and dyspnea at rest.  Would like to have an x-ray done of her sinuses.  She does have a history of chronic sinus issues and notes that in the past week or 2, she has had increased maxillary sinus pain specifically on the right.  When she had to do the nasal swabs to test for COVID, she noted that it was very painful to complete.  Continues to have sinus congestion as well as postnasal drip.  I reviewed the past medical history, family history, social history, surgical history, and allergies today and no changes were needed.  Please see the problem list section below in epic for further details.  Past Medical History: Past Medical History:  Diagnosis Date  . Arthritis   . Asthma   . Atrial fibrillation (HCC)    paroxysmal , addressed  with metoprolol   . Chronic sinusitis   . Complication of anesthesia   . Dyspnea    sob on exertion, reports at her pre-op today 09-13-2018 that her cardiologist has planned for her to do a ECHO for evaluation   . Family history of adverse reaction to anesthesia    sister is hard to wake up from Anesthesia  . Follicular lymphoma (HCC)    received Chemotherapy (2 years ago)  and radiation (last 12/2017)  . Hyperlipidemia   . Hypertension   . Hypothyroidism   . Meniere's disease   . PONV (postoperative nausea and vomiting)   . Primary localized osteoarthritis of left knee 09/24/2018  . Primary localized osteoarthritis of right knee 06/18/2018   . Reflux    Past Surgical History: Past Surgical History:  Procedure Laterality Date  . ABDOMINAL HYSTERECTOMY    . BONE MARROW BIOPSY  06/16/2014   at [facility]  . CARPAL TUNNEL RELEASE     bilateral  . CHOLECYSTECTOMY    . KNEE ARTHROSCOPY    . PACEMAKER IMPLANT N/A 03/30/2020   Procedure: PACEMAKER IMPLANT;  Surgeon: Allred, James, MD;  Location: [facility];  Service: Cardiovascular;  Laterality: N/A;  . PARTIAL KNEE ARTHROPLASTY Right 06/18/2018   Procedure: UNICOMPARTMENTAL KNEE;  Surgeon: Landau, Joshua, MD;  Location: [facility];  Service: Orthopedics;  Laterality: Right;  . PARTIAL KNEE ARTHROPLASTY Left 09/24/2018   Procedure: UNICOMPARTMENTAL KNEE;  Surgeon: Landau, Joshua, MD;  Location: [facility];  Service: Orthopedics;  Laterality: Left;   Social History: Social History   Socioeconomic History  . Marital status: Widowed    Spouse name: Not on file  . Number of children: 1  . Years of education: 12  . Highest education level: 12th grade  Occupational History  . Occupation: dental field    Comment: retired  Tobacco Use  . Smoking status: Never Smoker  . Smokeless tobacco: Never Used  Vaping Use  . Vaping Use: Never used  Substance and Sexual Activity  . Alcohol use: No  . Drug use: No  . Sexual activity: Not Currently    Partners:   Male  Other Topics Concern  . Not on file  Social History Narrative  . Not on file   Social Determinants of Health   Financial Resource Strain: Not on file  Food Insecurity: Not on file  Transportation Needs: Not on file  Physical Activity: Not on file  Stress: Not on file  Social Connections: Not on file   Family History: Family History  Problem Relation Age of Onset  . Heart disease Other   . Skin cancer Mother   . Stroke Mother   . Prostate cancer Father   . High blood pressure Father   . Heart attack Father    Allergies: No Known Allergies Medications: See med rec.  Review of Systems: See HPI for  pertinent positives and negatives.   Objective:    General: Well Developed, well nourished, and in no acute distress.  Neuro: Alert and oriented x3.  HEENT: Normocephalic, atraumatic.  Right maxillary tenderness. Skin: Warm and dry. Cardiac: Regular rate and rhythm, no murmurs rubs or gallops, no lower extremity edema.  Respiratory: Clear to auscultation bilaterally. Not using accessory muscles, speaking in full sentences.  Impression and Recommendations:    1. Chest pain on breathing Getting a chest x-ray today. Ok to use Albuterol inhaler as prescribed for wheezing and shortness of breath.  - DG Chest 2 View; Future  2. Acute non-recurrent maxillary sinusitis Continued sinus congestion and PND after COVID but now accompanied by maxillary facial pain. Getting x-ray of sinuses due to chronic sinus issues and pain on doing swabs. Start Azithromycin 500mg today then 250mg daily for 4 days. Continue Mucinex. - azithromycin (ZITHROMAX) 250 MG tablet; Take 2 tablets on day 1, then 1 tablet daily on days 2 through 5  Dispense: 6 tablet; Refill: 0 - DG Sinus 1-2 Views; Future  Return if symptoms worsen or fail to improve. __________________________________________ Joy L. Jessup, DNP, APRN, FNP-BC Primary Care and Sports Medicine [facility]  

## 2020-12-22 DIAGNOSIS — R1319 Other dysphagia: Secondary | ICD-10-CM | POA: Diagnosis not present

## 2020-12-22 NOTE — Progress Notes (Signed)
Diagnosis: COVID  Provider:  Marshell Garfinkel, MD  Procedure: Infusion  IV Type: Peripheral, IV Location: L Antecubital  Bebtelovimab, Dose: 175 mg  Infusion Start Time: 1350  Infusion Stop Time: 1450  Post Infusion IV Care: Observation period completed  Discharge: Condition: Good, Destination: Home . AVS provided to patient.   Performed by:  Jonelle Sidle, RN

## 2020-12-23 DIAGNOSIS — I10 Essential (primary) hypertension: Secondary | ICD-10-CM | POA: Diagnosis not present

## 2020-12-23 DIAGNOSIS — I4891 Unspecified atrial fibrillation: Secondary | ICD-10-CM | POA: Diagnosis not present

## 2020-12-23 DIAGNOSIS — E039 Hypothyroidism, unspecified: Secondary | ICD-10-CM | POA: Diagnosis not present

## 2020-12-23 DIAGNOSIS — R1310 Dysphagia, unspecified: Secondary | ICD-10-CM | POA: Diagnosis not present

## 2020-12-23 DIAGNOSIS — E785 Hyperlipidemia, unspecified: Secondary | ICD-10-CM | POA: Diagnosis not present

## 2020-12-23 DIAGNOSIS — C8234 Follicular lymphoma grade IIIa, lymph nodes of axilla and upper limb: Secondary | ICD-10-CM | POA: Diagnosis not present

## 2020-12-23 DIAGNOSIS — C8232 Follicular lymphoma grade IIIa, intrathoracic lymph nodes: Secondary | ICD-10-CM | POA: Diagnosis not present

## 2020-12-27 DIAGNOSIS — R1110 Vomiting, unspecified: Secondary | ICD-10-CM | POA: Diagnosis not present

## 2020-12-27 DIAGNOSIS — R638 Other symptoms and signs concerning food and fluid intake: Secondary | ICD-10-CM | POA: Diagnosis not present

## 2020-12-27 DIAGNOSIS — R12 Heartburn: Secondary | ICD-10-CM | POA: Diagnosis not present

## 2020-12-29 DIAGNOSIS — I480 Paroxysmal atrial fibrillation: Secondary | ICD-10-CM | POA: Diagnosis not present

## 2020-12-29 DIAGNOSIS — I472 Ventricular tachycardia: Secondary | ICD-10-CM | POA: Diagnosis not present

## 2020-12-29 DIAGNOSIS — Z45018 Encounter for adjustment and management of other part of cardiac pacemaker: Secondary | ICD-10-CM | POA: Diagnosis not present

## 2020-12-29 DIAGNOSIS — I440 Atrioventricular block, first degree: Secondary | ICD-10-CM | POA: Diagnosis not present

## 2020-12-29 DIAGNOSIS — I471 Supraventricular tachycardia: Secondary | ICD-10-CM | POA: Diagnosis not present

## 2021-01-03 DIAGNOSIS — M65341 Trigger finger, right ring finger: Secondary | ICD-10-CM | POA: Diagnosis not present

## 2021-01-03 DIAGNOSIS — M65331 Trigger finger, right middle finger: Secondary | ICD-10-CM | POA: Diagnosis not present

## 2021-01-03 DIAGNOSIS — M25542 Pain in joints of left hand: Secondary | ICD-10-CM | POA: Diagnosis not present

## 2021-01-03 DIAGNOSIS — M1812 Unilateral primary osteoarthritis of first carpometacarpal joint, left hand: Secondary | ICD-10-CM | POA: Diagnosis not present

## 2021-01-03 DIAGNOSIS — G5603 Carpal tunnel syndrome, bilateral upper limbs: Secondary | ICD-10-CM | POA: Diagnosis not present

## 2021-01-04 DIAGNOSIS — J329 Chronic sinusitis, unspecified: Secondary | ICD-10-CM | POA: Diagnosis not present

## 2021-01-04 DIAGNOSIS — R0982 Postnasal drip: Secondary | ICD-10-CM | POA: Diagnosis not present

## 2021-01-04 DIAGNOSIS — J339 Nasal polyp, unspecified: Secondary | ICD-10-CM | POA: Diagnosis not present

## 2021-01-04 DIAGNOSIS — H9201 Otalgia, right ear: Secondary | ICD-10-CM | POA: Diagnosis not present

## 2021-01-04 DIAGNOSIS — J328 Other chronic sinusitis: Secondary | ICD-10-CM | POA: Diagnosis not present

## 2021-01-04 DIAGNOSIS — J300 Vasomotor rhinitis: Secondary | ICD-10-CM | POA: Diagnosis not present

## 2021-01-04 DIAGNOSIS — J039 Acute tonsillitis, unspecified: Secondary | ICD-10-CM | POA: Diagnosis not present

## 2021-01-05 ENCOUNTER — Other Ambulatory Visit: Payer: Self-pay | Admitting: Osteopathic Medicine

## 2021-01-17 NOTE — Progress Notes (Signed)
HPI: Follow-up paroxysmal atrial fibrillation and history of syncope (bradycardia mediated status post pacemaker).  Patient admitted September 2021 with syncopal episode.  Found to be bradycardic and ultimately underwent pacemaker.  Echocardiogram September 2021 showed normal LV function, grade 1 diastolic dysfunction, mild mitral regurgitation, moderate aortic insufficiency, mildly dilated ascending aorta at 38 mm. Carotid Doppler September 2021 showed 1 to 39% bilateral stenosis. Since last seen, he has chest discomfort.  It does not occur necessarily with activities.  Resolve spontaneously.  She has some dyspnea on exertion.  No orthopnea, PND, pedal edema or syncope.  Occasional brief palpitations.  Current Outpatient Medications  Medication Sig Dispense Refill   albuterol (VENTOLIN HFA) 108 (90 Base) MCG/ACT inhaler Inhale 2 puffs into the lungs every 6 (six) hours as needed for wheezing or shortness of breath. 8 g 0   amLODipine (NORVASC) 5 MG tablet TAKE 1 TABLET BY MOUTH  DAILY 90 tablet 3   aspirin EC 81 MG tablet Take 81 mg by mouth daily.     baclofen (LIORESAL) 10 MG tablet Take 1 tablet (10 mg total) by mouth 3 (three) times daily as needed (muscle spasms). 270 tablet 0   fluticasone (FLOVENT HFA) 110 MCG/ACT inhaler Inhale 2 puffs into the lungs 2 (two) times daily as needed (for shortness of breath or wheezing). 3 Inhaler 3   gabapentin (NEURONTIN) 100 MG capsule TAKE 1 CAPSULE BY MOUTH  TWICE DAILY AS NEEDED 180 capsule 2   hydrochlorothiazide (HYDRODIURIL) 12.5 MG tablet Take 1 tablet (12.5 mg total) by mouth daily. 30 tablet 2   hydroxychloroquine (PLAQUENIL) 200 MG tablet Take 200 mg by mouth 2 (two) times daily.      isosorbide mononitrate (IMDUR) 30 MG 24 hr tablet TAKE 1 TABLET BY MOUTH  DAILY 90 tablet 3   levothyroxine (SYNTHROID) 112 MCG tablet TAKE 1 TABLET BY MOUTH  DAILY BEFORE BREAKFAST 90 tablet 1   losartan (COZAAR) 100 MG tablet Take 1 tablet (100 mg total) by  mouth at bedtime. 90 tablet 3   meclizine (ANTIVERT) 25 MG tablet Take 1 tablet (25 mg total) by mouth 3 (three) times daily as needed for dizziness. 30 tablet 0   meloxicam (MOBIC) 15 MG tablet Take 15 mg by mouth daily as needed for pain.     metoprolol tartrate (LOPRESSOR) 25 MG tablet Take 50 mg by mouth 2 (two) times daily.     omeprazole (PRILOSEC) 40 MG capsule TAKE 1 CAPSULE BY MOUTH IN  THE MORNING AND MAY REPEAT  DOSE IN THE EVENING AS  NEEDED FOR ACID REFLUX /  INDIGESTION 180 capsule 3   ondansetron (ZOFRAN) 4 MG tablet Take 1 tablet (4 mg total) by mouth every 8 (eight) hours as needed for nausea or vomiting. 10 tablet 0   pravastatin (PRAVACHOL) 80 MG tablet TAKE 1 TABLET BY MOUTH AT  BEDTIME 90 tablet 3   sennosides-docusate sodium (SENOKOT-S) 8.6-50 MG tablet Take 2 tablets by mouth daily. 30 tablet 1   tetrahydrozoline 0.05 % ophthalmic solution Place 2 drops into both eyes daily as needed (for dry eyes).     triamcinolone (NASACORT ALLERGY 24HR) 55 MCG/ACT AERO nasal inhaler Place 2 sprays into the nose daily. 3 Inhaler 3   benzonatate (TESSALON) 100 MG capsule Take 1 capsule (100 mg total) by mouth 3 (three) times daily as needed for cough. (Patient not taking: Reported on 01/21/2021) 30 capsule 0   Current Facility-Administered Medications  Medication Dose Route Frequency Provider  Last Rate Last Admin   0.9 %  sodium chloride infusion   Intravenous PRN Jacquelin Hawking, NP         Past Medical History:  Diagnosis Date   Arthritis    Asthma    Atrial fibrillation (HCC)    paroxysmal , addressed  with metoprolol    Chronic sinusitis    Complication of anesthesia    Dyspnea    sob on exertion, reports at her pre-op today 09-13-2018 that her cardiologist has planned for her to do a ECHO for evaluation    Family history of adverse reaction to anesthesia    sister is hard to wake up from Anesthesia   Follicular lymphoma ([place])    received Chemotherapy (2 years ago)  and  radiation (last 12/2017)   Hyperlipidemia    Hypertension    Hypothyroidism    Meniere's disease    PONV (postoperative nausea and vomiting)    Primary localized osteoarthritis of left knee 09/24/2018   Primary localized osteoarthritis of right knee 06/18/2018   Reflux     Past Surgical History:  Procedure Laterality Date   ABDOMINAL HYSTERECTOMY     BONE MARROW BIOPSY  06/16/2014   at [place]     bilateral   CHOLECYSTECTOMY     KNEE ARTHROSCOPY     PACEMAKER IMPLANT N/A 03/30/2020   Procedure: PACEMAKER IMPLANT;  Surgeon: Thompson Grayer, MD;  Location: [facility];  Service: Cardiovascular;  Laterality: N/A;   PARTIAL KNEE ARTHROPLASTY Right 06/18/2018   Procedure: UNICOMPARTMENTAL KNEE;  Surgeon: Marchia Bond, MD;  Location: [facility];  Service: Orthopedics;  Laterality: Right;   PARTIAL KNEE ARTHROPLASTY Left 09/24/2018   Procedure: UNICOMPARTMENTAL KNEE;  Surgeon: Marchia Bond, MD;  Location: [facility];  Service: Orthopedics;  Laterality: Left;    Social History   Socioeconomic History   Marital status: Widowed    Spouse name: Not on file   Number of children: 1   Years of education: 1   Highest education level: 12th grade  Occupational History   Occupation: Theatre manager    Comment: retired  Tobacco Use   Smoking status: Never   Smokeless tobacco: Never  Vaping Use   Vaping Use: Never used  Substance and Sexual Activity   Alcohol use: No   Drug use: No   Sexual activity: Not Currently    Partners: Male  Other Topics Concern   Not on file  Social History Narrative   Not on file   Social Determinants of Health   Financial Resource Strain: Not on file  Food Insecurity: Not on file  Transportation Needs: Not on file  Physical Activity: Not on file  Stress: Not on file  Social Connections: Not on file  Intimate Partner Violence: Not on file    Family History  Problem Relation Age of Onset   Heart disease Other    Skin  cancer Mother    Stroke Mother    Prostate cancer Father    High blood pressure Father    Heart attack Father     ROS: no fevers or chills, productive cough, hemoptysis, dysphasia, odynophagia, melena, hematochezia, dysuria, hematuria, rash, seizure activity, orthopnea, PND, pedal edema, claudication. Remaining systems are negative.  Physical Exam: Well-developed well-nourished in no acute distress.  Skin is warm and dry.  HEENT is normal.  Neck is supple.  Chest is clear to auscultation with normal expansion.  Cardiovascular exam is regular rate and  rhythm.  2/6 systolic and diastolic murmur. Abdominal exam nontender or distended. No masses palpated. Extremities show no edema. neuro grossly intact  ECG-sinus rhythm at a rate of 64, incomplete right bundle branch block, cannot rule out inferior infarct, first-degree AV block.  Personally reviewed  A/P  1 history of paroxysmal atrial fibrillation-Per Dr. Rayann Heman this was noted on device check.  However he stated it was very short-lived.  I will forward message to him concerning anticoagulation.  We will continue to monitor for atrial fibrillation and she would need anticoagulation certainly if it becomes frequent.  2 moderate aortic insufficiency-we will plan follow-up echocardiogram September 2022.  3 pacemaker-follow-up Dr. Rayann Heman.  Patient would like Dr. Rayann Heman to follow her pacemaker and we will arrange.  4 hypertension-blood pressure elevated; however typically controlled.  She will follow and we will advance medications as needed.  5 history of PVCs-continue beta-blocker.  6 chest pain-symptom somewhat atypical.  We will arrange a [place] nuclear study for risk stratification.  Kirk Ruths, MD

## 2021-01-19 ENCOUNTER — Telehealth: Payer: Self-pay | Admitting: *Deleted

## 2021-01-19 DIAGNOSIS — M3500 Sicca syndrome, unspecified: Secondary | ICD-10-CM | POA: Diagnosis not present

## 2021-01-19 DIAGNOSIS — Z6830 Body mass index (BMI) 30.0-30.9, adult: Secondary | ICD-10-CM | POA: Diagnosis not present

## 2021-01-19 DIAGNOSIS — Z79899 Other long term (current) drug therapy: Secondary | ICD-10-CM | POA: Diagnosis not present

## 2021-01-19 DIAGNOSIS — M150 Primary generalized (osteo)arthritis: Secondary | ICD-10-CM | POA: Diagnosis not present

## 2021-01-19 DIAGNOSIS — M545 Low back pain, unspecified: Secondary | ICD-10-CM | POA: Diagnosis not present

## 2021-01-19 DIAGNOSIS — M2550 Pain in unspecified joint: Secondary | ICD-10-CM | POA: Diagnosis not present

## 2021-01-19 DIAGNOSIS — E669 Obesity, unspecified: Secondary | ICD-10-CM | POA: Diagnosis not present

## 2021-01-19 DIAGNOSIS — M791 Myalgia, unspecified site: Secondary | ICD-10-CM | POA: Diagnosis not present

## 2021-01-19 NOTE — Chronic Care Management (AMB) (Signed)
  Chronic Care Management   Note  01/19/2021 Name: [name] MRN: [id number] DOB: 01-06-1935  [name] is a 86 y.o. year old female who is a primary care patient of [name], DO. I reached out to [employer] by phone today in response to a referral sent by [name]'s PCP  [name], DO     [name] was given information about Chronic Care Management services today including:  CCM service includes personalized support from designated clinical staff supervised by her physician, including individualized plan of care and coordination with other care providers 24/7 contact phone numbers for assistance for urgent and routine care needs. Service will only be billed when office clinical staff spend 20 minutes or more in a month to coordinate care. Only one practitioner may furnish and bill the service in a calendar month. The patient may stop CCM services at any time (effective at the end of the month) by phone call to the office staff. The patient will be responsible for cost sharing (co-pay) of up to 20% of the service fee (after annual deductible is met).  Patient did not agree to enrollment in care management services and does not wish to consider at this time.  Follow up plan: Patient declines further follow up and engagement by the care management team. Appropriate care team members and provider have been notified via electronic communication.   Julian Hy, [facility]  Direct Dial: [phone]

## 2021-01-19 NOTE — Chronic Care Management (AMB) (Signed)
  Chronic Care Management   Outreach Note  01/19/2021 Name: MAYSEN BONSIGNORE MRN: [id number] DOB: 22-Jun-1935  TIARE ROHLMAN is a 86 y.o. year old female who is a primary care patient of [name], DO. I reached out to [employer] by phone today in response to a referral sent by Ms. [place] PCP [name], DO     An unsuccessful telephone outreach was attempted today. The patient was referred to the case management team for assistance with care management and care coordination.   Follow Up Plan: A HIPAA compliant phone message was left for the patient providing contact information and requesting a return call.  If patient returns call to provider office, please advise to call Embedded Care Management Care Guide Lenor Provencher at [place], [place] [facility]  Direct Dial: [phone]

## 2021-01-21 ENCOUNTER — Telehealth: Payer: Self-pay

## 2021-01-21 ENCOUNTER — Ambulatory Visit (INDEPENDENT_AMBULATORY_CARE_PROVIDER_SITE_OTHER): Payer: Medicare Other | Admitting: Cardiology

## 2021-01-21 ENCOUNTER — Other Ambulatory Visit: Payer: Self-pay

## 2021-01-21 ENCOUNTER — Encounter: Payer: Self-pay | Admitting: Cardiology

## 2021-01-21 VITALS — BP 180/86 | HR 64 | Ht 65.5 in | Wt 186.0 lb

## 2021-01-21 DIAGNOSIS — I350 Nonrheumatic aortic (valve) stenosis: Secondary | ICD-10-CM

## 2021-01-21 DIAGNOSIS — Z950 Presence of cardiac pacemaker: Secondary | ICD-10-CM | POA: Diagnosis not present

## 2021-01-21 DIAGNOSIS — R072 Precordial pain: Secondary | ICD-10-CM

## 2021-01-21 MED ORDER — HYDROCHLOROTHIAZIDE 12.5 MG PO TABS: 12.5000 mg | ORAL_TABLET | Freq: Every day | ORAL | 3 refills | Status: DC

## 2021-01-21 MED ORDER — ISOSORBIDE MONONITRATE ER 30 MG PO TB24: 30.0000 mg | ORAL_TABLET | Freq: Every day | ORAL | 3 refills | Status: DC

## 2021-01-21 MED ORDER — PRAVASTATIN SODIUM 80 MG PO TABS: 80.0000 mg | ORAL_TABLET | Freq: Every day | ORAL | 3 refills | Status: DC

## 2021-01-21 MED ORDER — METOPROLOL TARTRATE 25 MG PO TABS
50.0000 mg | ORAL_TABLET | Freq: Two times a day (BID) | ORAL | 3 refills | Status: DC
Start: 2021-01-21 — End: 2021-07-08

## 2021-01-21 MED ORDER — LEVOTHYROXINE SODIUM 112 MCG PO TABS: 112.0000 ug | ORAL_TABLET | Freq: Every day | ORAL | 3 refills | Status: DC

## 2021-01-21 MED ORDER — AMLODIPINE BESYLATE 5 MG PO TABS: 5.0000 mg | ORAL_TABLET | Freq: Every day | ORAL | 3 refills | Status: DC

## 2021-01-21 MED ORDER — LOSARTAN POTASSIUM 100 MG PO TABS: 100.0000 mg | ORAL_TABLET | Freq: Every day | ORAL | 3 refills | Status: DC

## 2021-01-21 NOTE — Patient Instructions (Signed)
  Testing/Procedures:  Your physician has requested that you have a lexiscan myoview. For further information please visit [email or web address]. Please follow instruction sheet, as given. [place] has requested that you have an echocardiogram. Echocardiography is a painless test that uses sound waves to create images of your heart. It provides your doctor with information about the size and shape of your heart and how well your heart's chambers and valves are working. This procedure takes approximately one hour. There are no restrictions for this procedure. [place] September 2022   Follow-Up: At [facility], you and your health needs are our priority.  As part of our continuing mission to provide you with exceptional heart care, we have created designated Provider Care Teams.  These Care Teams include your primary Cardiologist (physician) and Advanced Practice Providers (APPs -  Physician Assistants and Nurse Practitioners) who all work together to provide you with the care you need, when you need it.  We recommend signing up for the patient portal called "MyChart".  Sign up information is provided on this After Visit Summary.  MyChart is used to connect with patients for Virtual Visits (Telemedicine).  Patients are able to view lab/test results, encounter notes, upcoming appointments, etc.  Non-urgent messages can be sent to your provider as well.   To learn more about what you can do with MyChart, go to [email or web address].    Your next appointment:   6 month(s)  The format for your next appointment:   In Person  Provider:   Kirk Ruths, MD

## 2021-01-21 NOTE — Telephone Encounter (Signed)
Spoke with pt. Daughter, Cyril Mourning (DPR on file).  She confirmed that pt would like all pacemaker care/ EP to be managed by Dr. Rayann Heman.   Pt last seen by Dr. Rayann Heman 07/12/20, advised pt would be due for in-clinic f/u in January 2023.

## 2021-01-21 NOTE — Telephone Encounter (Signed)
-----   Message from Cristopher Estimable, RN sent at 01/21/2021 11:46 AM EDT ----- This patient was last seen by allred in January and then she moved her care to [place]. She would like to move her device follow up back to dr allred. Please contact the patient. Thank you.

## 2021-01-22 NOTE — Telephone Encounter (Signed)
I agree with transferring remotes back to our team and keep follow-up in January.

## 2021-01-24 NOTE — Telephone Encounter (Signed)
Request to transfer remote monitoring from [facility] submitted in [place].  Attempted to contact office to f/u.  # in carelink for office goes directly to a VM for an individual who does not identify as being part of [facility].

## 2021-01-28 DIAGNOSIS — G5603 Carpal tunnel syndrome, bilateral upper limbs: Secondary | ICD-10-CM | POA: Diagnosis not present

## 2021-01-28 DIAGNOSIS — E039 Hypothyroidism, unspecified: Secondary | ICD-10-CM | POA: Diagnosis not present

## 2021-01-28 DIAGNOSIS — Z950 Presence of cardiac pacemaker: Secondary | ICD-10-CM | POA: Diagnosis not present

## 2021-01-28 DIAGNOSIS — I480 Paroxysmal atrial fibrillation: Secondary | ICD-10-CM | POA: Diagnosis not present

## 2021-01-28 DIAGNOSIS — R1310 Dysphagia, unspecified: Secondary | ICD-10-CM | POA: Diagnosis not present

## 2021-01-28 DIAGNOSIS — K219 Gastro-esophageal reflux disease without esophagitis: Secondary | ICD-10-CM | POA: Diagnosis not present

## 2021-01-28 DIAGNOSIS — I459 Conduction disorder, unspecified: Secondary | ICD-10-CM | POA: Diagnosis not present

## 2021-01-28 DIAGNOSIS — C8240 Follicular lymphoma grade IIIb, unspecified site: Secondary | ICD-10-CM | POA: Diagnosis not present

## 2021-01-28 DIAGNOSIS — Z1589 Genetic susceptibility to other disease: Secondary | ICD-10-CM | POA: Diagnosis not present

## 2021-01-28 DIAGNOSIS — J4520 Mild intermittent asthma, uncomplicated: Secondary | ICD-10-CM | POA: Diagnosis not present

## 2021-01-28 DIAGNOSIS — I10 Essential (primary) hypertension: Secondary | ICD-10-CM | POA: Diagnosis not present

## 2021-01-28 NOTE — Telephone Encounter (Signed)
Pt transfer has gone through in [place].  Next transmission due 03/30/21

## 2021-02-01 DIAGNOSIS — E039 Hypothyroidism, unspecified: Secondary | ICD-10-CM | POA: Diagnosis not present

## 2021-02-01 DIAGNOSIS — J328 Other chronic sinusitis: Secondary | ICD-10-CM | POA: Diagnosis not present

## 2021-02-01 DIAGNOSIS — R1319 Other dysphagia: Secondary | ICD-10-CM | POA: Diagnosis not present

## 2021-02-01 DIAGNOSIS — Z923 Personal history of irradiation: Secondary | ICD-10-CM | POA: Diagnosis not present

## 2021-02-01 DIAGNOSIS — E785 Hyperlipidemia, unspecified: Secondary | ICD-10-CM | POA: Diagnosis not present

## 2021-02-01 DIAGNOSIS — C8210 Follicular lymphoma grade II, unspecified site: Secondary | ICD-10-CM | POA: Diagnosis not present

## 2021-02-01 DIAGNOSIS — K224 Dyskinesia of esophagus: Secondary | ICD-10-CM | POA: Diagnosis not present

## 2021-02-01 DIAGNOSIS — I441 Atrioventricular block, second degree: Secondary | ICD-10-CM | POA: Diagnosis not present

## 2021-02-01 DIAGNOSIS — K219 Gastro-esophageal reflux disease without esophagitis: Secondary | ICD-10-CM | POA: Diagnosis not present

## 2021-02-01 DIAGNOSIS — I10 Essential (primary) hypertension: Secondary | ICD-10-CM | POA: Diagnosis not present

## 2021-02-08 ENCOUNTER — Other Ambulatory Visit: Payer: Self-pay | Admitting: Osteopathic Medicine

## 2021-02-08 DIAGNOSIS — G5602 Carpal tunnel syndrome, left upper limb: Secondary | ICD-10-CM | POA: Diagnosis not present

## 2021-02-08 DIAGNOSIS — M1812 Unilateral primary osteoarthritis of first carpometacarpal joint, left hand: Secondary | ICD-10-CM | POA: Diagnosis not present

## 2021-02-08 DIAGNOSIS — M65332 Trigger finger, left middle finger: Secondary | ICD-10-CM | POA: Diagnosis not present

## 2021-02-08 DIAGNOSIS — M65322 Trigger finger, left index finger: Secondary | ICD-10-CM | POA: Diagnosis not present

## 2021-02-08 DIAGNOSIS — M65342 Trigger finger, left ring finger: Secondary | ICD-10-CM | POA: Diagnosis not present

## 2021-02-09 ENCOUNTER — Encounter (HOSPITAL_COMMUNITY): Payer: Self-pay | Admitting: *Deleted

## 2021-02-09 ENCOUNTER — Telehealth (HOSPITAL_COMMUNITY): Payer: Self-pay | Admitting: *Deleted

## 2021-02-09 NOTE — Telephone Encounter (Signed)
Patient given detailed instructions per Myocardial Perfusion Study Information Sheet for the test on 02/16/21 at 1015. Patient notified to arrive 15 minutes early and that it is imperative to arrive on time for appointment to keep from having the test rescheduled.  If you need to cancel or reschedule your appointment, please call the office within 24 hours of your appointment. . Patient verbalized understanding.[place] Mychart letter sent

## 2021-02-16 ENCOUNTER — Other Ambulatory Visit: Payer: Self-pay

## 2021-02-16 ENCOUNTER — Other Ambulatory Visit (HOSPITAL_COMMUNITY): Payer: Medicare Other

## 2021-02-16 ENCOUNTER — Ambulatory Visit (HOSPITAL_COMMUNITY): Payer: Medicare Other | Attending: Cardiology

## 2021-02-16 DIAGNOSIS — R072 Precordial pain: Secondary | ICD-10-CM | POA: Diagnosis not present

## 2021-02-16 LAB — MYOCARDIAL PERFUSION IMAGING
LV dias vol: 127 mL (ref 46–106)
LV sys vol: 76 mL
Peak HR: 97 {beats}/min
Rest HR: 81 {beats}/min
SDS: 1
SRS: 0
SSS: 1
TID: 1.01

## 2021-02-16 MED ORDER — TECHNETIUM TC 99M TETROFOSMIN IV KIT
10.8000 | PACK | Freq: Once | INTRAVENOUS | Status: AC | PRN
  Administered 2021-02-16: 10.8 via INTRAVENOUS
  Filled 2021-02-16: qty 11

## 2021-02-16 MED ORDER — TECHNETIUM TC 99M TETROFOSMIN IV KIT
29.4000 | PACK | Freq: Once | INTRAVENOUS | Status: AC | PRN
  Administered 2021-02-16: 29.4 via INTRAVENOUS
  Filled 2021-02-16: qty 30

## 2021-02-16 MED ORDER — REGADENOSON 0.4 MG/5ML IV SOLN
0.4000 mg | Freq: Once | INTRAVENOUS | Status: AC
Start: 2021-02-16 — End: 2021-02-16
  Administered 2021-02-16: 0.4 mg via INTRAVENOUS

## 2021-02-16 MED ORDER — AMINOPHYLLINE 25 MG/ML IV SOLN
75.0000 mg | Freq: Once | INTRAVENOUS | Status: AC
  Administered 2021-02-16: 75 mg via INTRAVENOUS

## 2021-02-18 DIAGNOSIS — M48062 Spinal stenosis, lumbar region with neurogenic claudication: Secondary | ICD-10-CM | POA: Diagnosis not present

## 2021-02-18 DIAGNOSIS — M47812 Spondylosis without myelopathy or radiculopathy, cervical region: Secondary | ICD-10-CM | POA: Diagnosis not present

## 2021-02-18 DIAGNOSIS — M47816 Spondylosis without myelopathy or radiculopathy, lumbar region: Secondary | ICD-10-CM | POA: Diagnosis not present

## 2021-02-18 DIAGNOSIS — M542 Cervicalgia: Secondary | ICD-10-CM | POA: Diagnosis not present

## 2021-03-02 DIAGNOSIS — M48062 Spinal stenosis, lumbar region with neurogenic claudication: Secondary | ICD-10-CM | POA: Diagnosis not present

## 2021-03-09 ENCOUNTER — Ambulatory Visit (HOSPITAL_COMMUNITY): Payer: Medicare Other | Attending: Cardiovascular Disease

## 2021-03-09 ENCOUNTER — Other Ambulatory Visit: Payer: Self-pay

## 2021-03-09 DIAGNOSIS — I350 Nonrheumatic aortic (valve) stenosis: Secondary | ICD-10-CM | POA: Diagnosis not present

## 2021-03-09 LAB — ECHOCARDIOGRAM COMPLETE
AR max vel: 1.33 cm2
AV Area VTI: 1.28 cm2
AV Area mean vel: 1.17 cm2
AV Mean grad: 16 mmHg
AV Peak grad: 27.9 mmHg
Ao pk vel: 2.64 m/s
Area-P 1/2: 2.16 cm2
P 1/2 time: 344 msec
S' Lateral: 2.9 cm

## 2021-03-15 ENCOUNTER — Telehealth: Payer: Self-pay | Admitting: Cardiology

## 2021-03-15 NOTE — Telephone Encounter (Signed)
New Message:     Daughter called and said [name] had an Echo on 03-09-21. She wants to know if Dr Stanford Breed thinks that might be causing the [name]'s shortness of breath and her being fatigued? If so, what can be did for this ?

## 2021-03-15 NOTE — Telephone Encounter (Signed)
Daughter called questioning if MD feels pt SOB and fatigue is related to cardiac findings from echocardiogram. Daughter informed MD reviewed results and indicated heart pumping function is normal, mild to moderate aortic regurgitation, and mild aortic stenosis.   Daughter state if MD doesn't feel it's cardiac related, what should she do next.

## 2021-03-16 NOTE — Telephone Encounter (Signed)
Daughter updated and verbalized understanding.   Lelon Perla, MD  You 6 hours ago (7:25 AM)   Not clear that dyspnea is cardiac related; may need fu with primary care to further assess potential noncardiac causes  Kirk Ruths

## 2021-03-21 ENCOUNTER — Other Ambulatory Visit: Payer: Self-pay

## 2021-03-21 ENCOUNTER — Ambulatory Visit (INDEPENDENT_AMBULATORY_CARE_PROVIDER_SITE_OTHER): Payer: Medicare Other | Admitting: Osteopathic Medicine

## 2021-03-21 ENCOUNTER — Encounter: Payer: Self-pay | Admitting: Osteopathic Medicine

## 2021-03-21 VITALS — BP 141/72 | HR 69 | Ht 65.0 in | Wt 184.0 lb

## 2021-03-21 DIAGNOSIS — E7849 Other hyperlipidemia: Secondary | ICD-10-CM | POA: Diagnosis not present

## 2021-03-21 DIAGNOSIS — Z780 Asymptomatic menopausal state: Secondary | ICD-10-CM | POA: Diagnosis not present

## 2021-03-21 DIAGNOSIS — D649 Anemia, unspecified: Secondary | ICD-10-CM | POA: Diagnosis not present

## 2021-03-21 DIAGNOSIS — E559 Vitamin D deficiency, unspecified: Secondary | ICD-10-CM

## 2021-03-21 DIAGNOSIS — M25532 Pain in left wrist: Secondary | ICD-10-CM | POA: Diagnosis not present

## 2021-03-21 DIAGNOSIS — J45909 Unspecified asthma, uncomplicated: Secondary | ICD-10-CM | POA: Insufficient documentation

## 2021-03-21 DIAGNOSIS — M899 Disorder of bone, unspecified: Secondary | ICD-10-CM

## 2021-03-21 DIAGNOSIS — R5382 Chronic fatigue, unspecified: Secondary | ICD-10-CM

## 2021-03-21 DIAGNOSIS — E039 Hypothyroidism, unspecified: Secondary | ICD-10-CM | POA: Diagnosis not present

## 2021-03-21 DIAGNOSIS — M79645 Pain in left finger(s): Secondary | ICD-10-CM | POA: Diagnosis not present

## 2021-03-21 DIAGNOSIS — Z79899 Other long term (current) drug therapy: Secondary | ICD-10-CM

## 2021-03-21 DIAGNOSIS — I10 Essential (primary) hypertension: Secondary | ICD-10-CM | POA: Diagnosis not present

## 2021-03-21 DIAGNOSIS — R309 Painful micturition, unspecified: Secondary | ICD-10-CM | POA: Diagnosis not present

## 2021-03-21 DIAGNOSIS — R350 Frequency of micturition: Secondary | ICD-10-CM

## 2021-03-21 DIAGNOSIS — Z23 Encounter for immunization: Secondary | ICD-10-CM | POA: Diagnosis not present

## 2021-03-21 LAB — POCT URINALYSIS DIP (CLINITEK)
Bilirubin, UA: NEGATIVE
Blood, UA: NEGATIVE
Glucose, UA: NEGATIVE mg/dL
Ketones, POC UA: NEGATIVE mg/dL
Leukocytes, UA: NEGATIVE
Nitrite, UA: NEGATIVE
POC PROTEIN,UA: NEGATIVE
Spec Grav, UA: 1.02 (ref 1.010–1.025)
Urobilinogen, UA: 0.2 E.U./dL
pH, UA: 5.5 (ref 5.0–8.0)

## 2021-03-21 LAB — VITAMIN B12: Vitamin B-12: 335 pg/mL (ref 200–1100)

## 2021-03-21 LAB — IRON,TIBC AND FERRITIN PANEL
%SAT: 37 % (calc) (ref 16–45)
Ferritin: 105 ng/mL (ref 16–288)
Iron: 122 ug/dL (ref 45–160)
TIBC: 328 mcg/dL (calc) (ref 250–450)

## 2021-03-21 LAB — VITAMIN D 25 HYDROXY (VIT D DEFICIENCY, FRACTURES): Vit D, 25-Hydroxy: 20 ng/mL — ABNORMAL LOW (ref 30–100)

## 2021-03-21 MED ORDER — PREMARIN 0.625 MG/GM VA CREA: TOPICAL_CREAM | VAGINAL | 3 refills | Status: DC

## 2021-03-21 MED ORDER — CLOBETASOL PROPIONATE 0.05 % EX SHAM: 1.0000 "application " | MEDICATED_SHAMPOO | Freq: Every day | CUTANEOUS | 3 refills | Status: DC

## 2021-03-21 MED ORDER — FLUTICASONE PROPIONATE HFA 110 MCG/ACT IN AERO: 2.0000 | INHALATION_SPRAY | Freq: Two times a day (BID) | RESPIRATORY_TRACT | 11 refills | Status: DC | PRN

## 2021-03-21 NOTE — Progress Notes (Signed)
[name] is a 86 y.o. female who presents to  [place] at [facility]  today, 03/21/21, seeking care for the following:  Concern for urinary tract infection, continued dysuria and frequency.  Urinalysis as below, no concerns.  No abdominal pain, no fever, no nausea  Concern for scalp rash/itching, on exam, there is a scaly patch at her crown, see photograph    Concern for persistent fatigue, daughter inquires about getting some blood work, recent labs reviewed, see below     [place] with other pertinent findings:  The primary encounter diagnosis was Urinary frequency. Diagnoses of Painful urination, Vitamin D deficiency, Benign essential hypertension, Other hyperlipidemia, Hypothyroidism, unspecified type, Current use of proton pump inhibitor, Chronic fatigue, Postmenopausal, and Bone disorder were also pertinent to this visit.  Painful urination with normal UA, will send for urine culture but I suspect symptoms are most likely due to postmenopausal atrophic vaginitis.  Patient will restart vaginal estrogen, has some questions about whether previous surgical vaginal/urethral sling might have anything to do with this, I advised we would want her to follow-up with urology if she has concerns about this but this seems unlikely based on current symptoms, will trial estrogen and see if this helps  Sent clobetasol for what looks like scalp psoriasis, if not improving will refer to dermatology  Fatigue very likely post COVID in nature, refilled patient's inhalers, she has had evaluation by cardiology per daughter and they had no concerns.  Has noted not necessarily severe but definitely more significant fatigue/dyspnea on exertion since her COVID infection earlier this year.  Patient Instructions  For scalp: Nizoral or Neutrogena T-Sal OTC w/ salicylic acid  Tea Tree ingredients can help  Rx: Clobetasol shampoo       Orders  Placed This Encounter  Procedures   Urine Culture   DG Bone Density   Vitamin B12   Fe+TIBC+Fer   Vitamin D (25 hydroxy)   POCT URINALYSIS DIP (CLINITEK)    Meds ordered this encounter  Medications   conjugated estrogens (PREMARIN) vaginal cream    Sig: Place 1 Applicatorful vaginally daily for 7 days, THEN 1 Applicatorful 3 (three) times a week.    Dispense:  90 g    Refill:  3   fluticasone (FLOVENT HFA) 110 MCG/ACT inhaler    Sig: Inhale 2 puffs into the lungs 2 (two) times daily as needed (for shortness of breath or wheezing).    Dispense:  3 each    Refill:  11   Clobetasol Propionate 0.05 % shampoo    Sig: Apply 1 application topically daily. As needed for scalp irritation from psoriasis    Dispense:  236 mL    Refill:  3     See below for relevant physical exam findings  See below for recent lab and imaging results reviewed  Medications, allergies, PMH, PSH, SocH, FamH reviewed below    Follow-up instructions: Return in about 6 months (around 09/18/2021) for ROUTINE CHECK UP (OR SOONER IF NEEDED) WITH JOY - OV40 FOR ESTABLISH CARE >65YO .                                        Exam:  BP (!) 141/72 (BP Location: Left Arm, Patient Position: Sitting, Cuff Size: Normal)   Pulse 69   Ht '5\' 5"'  (1.651 m)   Wt 184  lb (83.5 kg)   SpO2 96%   BMI 30.62 kg/m  Constitutional: VS see above. General Appearance: alert, well-developed, well-nourished, NAD Neck: No masses, trachea midline.  Respiratory: Normal respiratory effort. no wheeze, no rhonchi, no rales Cardiovascular: S1/S2 normal, no murmur, no rub/gallop auscultated. RRR.  Musculoskeletal: Gait normal. Symmetric and independent movement of all extremities Neurological: Normal balance/coordination. No tremor. Skin: warm, dry, intact.  Psychiatric: Normal judgment/insight. Normal mood and affect. Oriented x3.   Current Meds  Medication Sig   albuterol (VENTOLIN HFA) 108 (90  Base) MCG/ACT inhaler Inhale 2 puffs into the lungs every 6 (six) hours as needed for wheezing or shortness of breath.   amLODipine (NORVASC) 5 MG tablet Take 1 tablet (5 mg total) by mouth daily.   aspirin EC 81 MG tablet Take 81 mg by mouth daily.   baclofen (LIORESAL) 10 MG tablet TAKE 1 TABLET BY MOUTH 3  TIMES DAILY AS NEEDED FOR  MUSCLE SPASMS   Clobetasol Propionate 0.05 % shampoo Apply 1 application topically daily. As needed for scalp irritation from psoriasis   conjugated estrogens (PREMARIN) vaginal cream Place 1 Applicatorful vaginally daily for 7 days, THEN 1 Applicatorful 3 (three) times a week.   gabapentin (NEURONTIN) 100 MG capsule TAKE 1 CAPSULE BY MOUTH  TWICE DAILY AS NEEDED   hydrochlorothiazide (HYDRODIURIL) 12.5 MG tablet Take 1 tablet (12.5 mg total) by mouth daily.   hydroxychloroquine (PLAQUENIL) 200 MG tablet Take 200 mg by mouth 2 (two) times daily.    isosorbide mononitrate (IMDUR) 30 MG 24 hr tablet Take 1 tablet (30 mg total) by mouth daily.   levothyroxine (SYNTHROID) 112 MCG tablet Take 1 tablet (112 mcg total) by mouth daily before breakfast.   losartan (COZAAR) 100 MG tablet Take 1 tablet (100 mg total) by mouth at bedtime.   meclizine (ANTIVERT) 25 MG tablet Take 1 tablet (25 mg total) by mouth 3 (three) times daily as needed for dizziness.   meloxicam (MOBIC) 15 MG tablet Take 15 mg by mouth daily as needed for pain.   metoprolol tartrate (LOPRESSOR) 25 MG tablet Take 2 tablets (50 mg total) by mouth 2 (two) times daily.   omeprazole (PRILOSEC) 40 MG capsule TAKE 1 CAPSULE BY MOUTH IN  THE MORNING AND MAY REPEAT  DOSE IN THE EVENING AS  NEEDED FOR ACID REFLUX /  INDIGESTION   ondansetron (ZOFRAN) 4 MG tablet Take 1 tablet (4 mg total) by mouth every 8 (eight) hours as needed for nausea or vomiting.   pravastatin (PRAVACHOL) 80 MG tablet Take 1 tablet (80 mg total) by mouth at bedtime.   sennosides-docusate sodium (SENOKOT-S) 8.6-50 MG tablet Take 2 tablets by  mouth daily.   tetrahydrozoline 0.05 % ophthalmic solution Place 2 drops into both eyes daily as needed (for dry eyes).   triamcinolone (NASACORT ALLERGY 24HR) 55 MCG/ACT AERO nasal inhaler Place 2 sprays into the nose daily.   [DISCONTINUED] fluticasone (FLOVENT HFA) 110 MCG/ACT inhaler Inhale 2 puffs into the lungs 2 (two) times daily as needed (for shortness of breath or wheezing).    No Known Allergies  Patient Active Problem List   Diagnosis Date Noted   Asthma without status asthmaticus 03/21/2021   Heart block 04/07/2020   Pacemaker 04/07/2020   Syncope 03/28/2020   Shingles 10/24/2019   MTHFR gene mutation 10/09/2018   Primary localized osteoarthritis of left knee 09/24/2018   S/P left unicompartmental knee replacement 09/24/2018   Fatigue 09/03/2018   Myalgia 09/03/2018   Vertigo 09/03/2018  Sebaceous cyst 09/03/2018   Callus of foot 09/03/2018   Primary localized osteoarthritis of right knee 06/18/2018   Status post right partial knee replacement 06/18/2018   Nasal polyposis 05/16/2018   Class 1 obesity due to excess calories with serious comorbidity and body mass index (BMI) of 30.0 to 30.9 in adult 07/16/2017   DDD (degenerative disc disease), cervical 07/16/2017   GERD (gastroesophageal reflux disease) 07/16/2017   Other hyperlipidemia 09/27/2016   Paroxysmal atrial fibrillation ([place]) 09/27/2016   Benign essential hypertension 22/48/2500   Follicular lymphoma ([place]) 12/18/2014   Lymphoma ([place]) 07/28/2014   Hypothyroidism 01/14/2014   Acquired trigger finger 08/05/2013   Carpal tunnel syndrome on both sides 06/30/2013   Dyspnea 12/25/2012    Family History  Problem Relation Age of Onset   Heart disease Other    Skin cancer Mother    Stroke Mother    Prostate cancer Father    High blood pressure Father    Heart attack Father     Social History   Tobacco Use  Smoking Status Never  Smokeless Tobacco Never    Past Surgical History:  Procedure  Laterality Date   ABDOMINAL HYSTERECTOMY     BONE MARROW BIOPSY  06/16/2014   at [place]     bilateral   CHOLECYSTECTOMY     KNEE ARTHROSCOPY     PACEMAKER IMPLANT N/A 03/30/2020   Procedure: PACEMAKER IMPLANT;  Surgeon: Thompson Grayer, MD;  Location: [place] [facility];  Service: Cardiovascular;  Laterality: N/A;   PARTIAL KNEE ARTHROPLASTY Right 06/18/2018   Procedure: UNICOMPARTMENTAL KNEE;  Surgeon: Marchia Bond, MD;  Location: [facility];  Service: Orthopedics;  Laterality: Right;   PARTIAL KNEE ARTHROPLASTY Left 09/24/2018   Procedure: UNICOMPARTMENTAL KNEE;  Surgeon: Marchia Bond, MD;  Location: [facility];  Service: Orthopedics;  Laterality: Left;    Immunization History  Administered Date(s) Administered   Influenza-Unspecified 04/23/2017, 05/10/2018, 08/04/2018, 07/10/2019   PFIZER(Purple Top)SARS-COV-2 Vaccination 07/24/2019, 08/13/2019, 03/05/2020    Recent Results (from the past 2160 hour(s))  MYOCARDIAL PERFUSION IMAGING     Status: None   Collection Time: 02/16/21  1:18 PM  Result Value Ref Range   Rest HR 81 bpm   Rest BP 144/80 mmHg   Peak HR 97 bpm   Peak BP 198/91 mmHg   SSS 1    SRS 0    SDS 1    TID 1.01    LV sys vol 76 mL   LV dias vol 127 46 - 106 mL  ECHOCARDIOGRAM COMPLETE     Status: None   Collection Time: 03/09/21  4:55 PM  Result Value Ref Range   Area-P 1/2 2.16 cm2   S' Lateral 2.90 cm   AV Area mean vel 1.17 cm2   AR max vel 1.33 cm2   AV Area VTI 1.28 cm2   P 1/2 time 344 msec   Ao pk vel 2.64 m/s   AV Mean grad 16.0 mmHg   AV Peak grad 27.9 mmHg  POCT URINALYSIS DIP (CLINITEK)     Status: Normal   Collection Time: 03/21/21 11:00 AM  Result Value Ref Range   Color, UA yellow yellow   Clarity, UA clear clear   Glucose, UA negative negative mg/dL   Bilirubin, UA negative negative   Ketones, POC UA negative negative mg/dL   Spec Grav, UA 1.020 1.010 - 1.025   Blood, UA negative negative   pH, UA 5.5 5.0 -  8.0   POC PROTEIN,UA negative negative, trace   Urobilinogen, UA 0.2 0.2 or 1.0 E.U./dL   Nitrite, UA Negative Negative   Leukocytes, UA Negative Negative    No results found.     All questions at time of visit were answered - patient instructed to contact office with any additional concerns or updates. ER/RTC precautions were reviewed with the patient as applicable.   Please note: manual typing as well as voice recognition software may have been used to produce this document - typos may escape review. Please contact Dr. Sheppard Coil for any needed clarifications.

## 2021-03-21 NOTE — Patient Instructions (Addendum)
For scalp: Nizoral or Neutrogena T-Sal OTC w/ salicylic acid  Tea Tree ingredients can help  Rx: Clobetasol shampoo

## 2021-03-23 ENCOUNTER — Encounter: Payer: Self-pay | Admitting: Osteopathic Medicine

## 2021-03-23 ENCOUNTER — Telehealth: Payer: Self-pay

## 2021-03-23 DIAGNOSIS — M48062 Spinal stenosis, lumbar region with neurogenic claudication: Secondary | ICD-10-CM | POA: Diagnosis not present

## 2021-03-23 DIAGNOSIS — M47812 Spondylosis without myelopathy or radiculopathy, cervical region: Secondary | ICD-10-CM | POA: Diagnosis not present

## 2021-03-23 LAB — URINE CULTURE
MICRO NUMBER:: 12364078
Result:: NO GROWTH
SPECIMEN QUALITY:: ADEQUATE

## 2021-03-23 MED ORDER — FLUTICASONE PROPIONATE HFA 110 MCG/ACT IN AERO: 2.0000 | INHALATION_SPRAY | Freq: Two times a day (BID) | RESPIRATORY_TRACT | 11 refills | Status: DC | PRN

## 2021-03-23 MED ORDER — CLOBETASOL PROPIONATE 0.05 % EX SHAM: 1.0000 "application " | MEDICATED_SHAMPOO | Freq: Every day | CUTANEOUS | 3 refills | Status: AC

## 2021-03-23 MED ORDER — PREMARIN 0.625 MG/GM VA CREA: TOPICAL_CREAM | VAGINAL | 3 refills | Status: AC

## 2021-03-23 NOTE — Addendum Note (Signed)
Addended by: Fonnie Mu on: 03/23/2021 11:29 AM   Modules accepted: Orders

## 2021-03-23 NOTE — Telephone Encounter (Signed)
The patient just wanted to know if she should take her monitor with her when she go out of the country? I told her if she is going to be gone for more than 7 days to take the monitor. Even if she decide not to its okay because she can send the transmission when she get home.

## 2021-03-25 DIAGNOSIS — L84 Corns and callosities: Secondary | ICD-10-CM | POA: Diagnosis not present

## 2021-03-25 DIAGNOSIS — L600 Ingrowing nail: Secondary | ICD-10-CM | POA: Diagnosis not present

## 2021-03-28 DIAGNOSIS — M79645 Pain in left finger(s): Secondary | ICD-10-CM | POA: Diagnosis not present

## 2021-03-28 DIAGNOSIS — M25532 Pain in left wrist: Secondary | ICD-10-CM | POA: Diagnosis not present

## 2021-03-30 ENCOUNTER — Ambulatory Visit (INDEPENDENT_AMBULATORY_CARE_PROVIDER_SITE_OTHER): Payer: Medicare Other

## 2021-03-30 DIAGNOSIS — I441 Atrioventricular block, second degree: Secondary | ICD-10-CM | POA: Diagnosis not present

## 2021-03-30 LAB — CUP PACEART REMOTE DEVICE CHECK
Battery Remaining Longevity: 171 mo
Battery Voltage: 3.14 V
Brady Statistic AP VP Percent: 0.03 %
Brady Statistic AP VS Percent: 8.08 %
Brady Statistic AS VP Percent: 0.04 %
Brady Statistic AS VS Percent: 91.85 %
Brady Statistic RA Percent Paced: 10.23 %
Brady Statistic RV Percent Paced: 0.07 %
Date Time Interrogation Session: 20220921010147
Implantable Lead Implant Date: 20210921
Implantable Lead Implant Date: 20210921
Implantable Lead Location: 753859
Implantable Lead Location: 753860
Implantable Lead Model: 3830
Implantable Lead Model: 5076
Implantable Pulse Generator Implant Date: 20210921
Lead Channel Impedance Value: 304 Ohm
Lead Channel Impedance Value: 380 Ohm
Lead Channel Impedance Value: 551 Ohm
Lead Channel Impedance Value: 570 Ohm
Lead Channel Pacing Threshold Amplitude: 1 V
Lead Channel Pacing Threshold Amplitude: 1.125 V
Lead Channel Pacing Threshold Pulse Width: 0.4 ms
Lead Channel Pacing Threshold Pulse Width: 0.4 ms
Lead Channel Sensing Intrinsic Amplitude: 12.625 mV
Lead Channel Sensing Intrinsic Amplitude: 12.625 mV
Lead Channel Sensing Intrinsic Amplitude: 2.875 mV
Lead Channel Sensing Intrinsic Amplitude: 2.875 mV
Lead Channel Setting Pacing Amplitude: 2 V
Lead Channel Setting Pacing Amplitude: 2.25 V
Lead Channel Setting Pacing Pulse Width: 0.4 ms
Lead Channel Setting Sensing Sensitivity: 0.9 mV

## 2021-04-04 DIAGNOSIS — M25532 Pain in left wrist: Secondary | ICD-10-CM | POA: Diagnosis not present

## 2021-04-04 DIAGNOSIS — M79645 Pain in left finger(s): Secondary | ICD-10-CM | POA: Diagnosis not present

## 2021-04-05 NOTE — Progress Notes (Signed)
Remote pacemaker transmission.   

## 2021-04-08 ENCOUNTER — Other Ambulatory Visit (HOSPITAL_COMMUNITY): Payer: Medicare Other

## 2021-04-26 DIAGNOSIS — M48062 Spinal stenosis, lumbar region with neurogenic claudication: Secondary | ICD-10-CM | POA: Diagnosis not present

## 2021-04-27 ENCOUNTER — Ambulatory Visit (INDEPENDENT_AMBULATORY_CARE_PROVIDER_SITE_OTHER): Payer: Medicare Other

## 2021-04-27 ENCOUNTER — Other Ambulatory Visit: Payer: Self-pay

## 2021-04-27 DIAGNOSIS — M899 Disorder of bone, unspecified: Secondary | ICD-10-CM | POA: Diagnosis not present

## 2021-04-27 DIAGNOSIS — Z780 Asymptomatic menopausal state: Secondary | ICD-10-CM

## 2021-04-27 DIAGNOSIS — M85832 Other specified disorders of bone density and structure, left forearm: Secondary | ICD-10-CM | POA: Diagnosis not present

## 2021-04-27 DIAGNOSIS — E559 Vitamin D deficiency, unspecified: Secondary | ICD-10-CM | POA: Diagnosis not present

## 2021-04-29 ENCOUNTER — Encounter: Payer: Self-pay | Admitting: Family Medicine

## 2021-04-29 DIAGNOSIS — M8580 Other specified disorders of bone density and structure, unspecified site: Secondary | ICD-10-CM | POA: Insufficient documentation

## 2021-04-29 NOTE — Progress Notes (Signed)
Hi [name], I reviewed your bone density test and it shows a T score of -1.5.  This puts you into the category of osteopenia, mildly thin bones.  Recommend repeat bone density in 2 to 3 years.   The current recommendation for osteopenia (mildly thin bones) treatment includes:   #1 calcium-total of 1200 mg of calcium daily.  If you eat a very calcium rich diet you may be able to obtain that without a supplement.  If not, then I recommend calcium 500 mg twice a day.  There are several products over-the-counter such as Caltrate D and Viactiv chews which are great options that contain calcium and vitamin D. #2 vitamin D-recommend 800 international units daily. #3 exercise-recommend 30 minutes of weightbearing exercise 3 days a week.  Resistance training ,such as doing bands and light weights, can be particularly helpful.

## 2021-05-17 DIAGNOSIS — M48062 Spinal stenosis, lumbar region with neurogenic claudication: Secondary | ICD-10-CM | POA: Diagnosis not present

## 2021-05-17 DIAGNOSIS — M47816 Spondylosis without myelopathy or radiculopathy, lumbar region: Secondary | ICD-10-CM | POA: Diagnosis not present

## 2021-06-15 DIAGNOSIS — M545 Low back pain, unspecified: Secondary | ICD-10-CM | POA: Diagnosis not present

## 2021-06-15 DIAGNOSIS — M7061 Trochanteric bursitis, right hip: Secondary | ICD-10-CM | POA: Diagnosis not present

## 2021-06-15 DIAGNOSIS — M7062 Trochanteric bursitis, left hip: Secondary | ICD-10-CM | POA: Diagnosis not present

## 2021-06-21 DIAGNOSIS — J9859 Other diseases of mediastinum, not elsewhere classified: Secondary | ICD-10-CM | POA: Diagnosis not present

## 2021-06-21 DIAGNOSIS — C8232 Follicular lymphoma grade IIIa, intrathoracic lymph nodes: Secondary | ICD-10-CM | POA: Diagnosis not present

## 2021-06-23 DIAGNOSIS — C8232 Follicular lymphoma grade IIIa, intrathoracic lymph nodes: Secondary | ICD-10-CM | POA: Diagnosis not present

## 2021-06-29 ENCOUNTER — Ambulatory Visit (INDEPENDENT_AMBULATORY_CARE_PROVIDER_SITE_OTHER): Payer: Medicare Other

## 2021-06-29 DIAGNOSIS — I441 Atrioventricular block, second degree: Secondary | ICD-10-CM | POA: Diagnosis not present

## 2021-06-29 LAB — CUP PACEART REMOTE DEVICE CHECK
Battery Remaining Longevity: 169 mo
Battery Voltage: 3.1 V
Brady Statistic AP VP Percent: 0.03 %
Brady Statistic AP VS Percent: 6.89 %
Brady Statistic AS VP Percent: 0.04 %
Brady Statistic AS VS Percent: 93.03 %
Brady Statistic RA Percent Paced: 8.91 %
Brady Statistic RV Percent Paced: 0.07 %
Date Time Interrogation Session: 20221221015640
Implantable Lead Implant Date: 20210921
Implantable Lead Implant Date: 20210921
Implantable Lead Location: 753859
Implantable Lead Location: 753860
Implantable Lead Model: 3830
Implantable Lead Model: 5076
Implantable Pulse Generator Implant Date: 20210921
Lead Channel Impedance Value: 285 Ohm
Lead Channel Impedance Value: 342 Ohm
Lead Channel Impedance Value: 361 Ohm
Lead Channel Impedance Value: 532 Ohm
Lead Channel Pacing Threshold Amplitude: 0.625 V
Lead Channel Pacing Threshold Amplitude: 0.875 V
Lead Channel Pacing Threshold Pulse Width: 0.4 ms
Lead Channel Pacing Threshold Pulse Width: 0.4 ms
Lead Channel Sensing Intrinsic Amplitude: 14.875 mV
Lead Channel Sensing Intrinsic Amplitude: 14.875 mV
Lead Channel Sensing Intrinsic Amplitude: 2.5 mV
Lead Channel Sensing Intrinsic Amplitude: 2.5 mV
Lead Channel Setting Pacing Amplitude: 1.5 V
Lead Channel Setting Pacing Amplitude: 2 V
Lead Channel Setting Pacing Pulse Width: 0.4 ms
Lead Channel Setting Sensing Sensitivity: 0.9 mV

## 2021-07-07 DIAGNOSIS — Z79899 Other long term (current) drug therapy: Secondary | ICD-10-CM | POA: Diagnosis not present

## 2021-07-07 NOTE — Progress Notes (Signed)
Remote pacemaker transmission.   

## 2021-07-08 ENCOUNTER — Ambulatory Visit (INDEPENDENT_AMBULATORY_CARE_PROVIDER_SITE_OTHER): Payer: Medicare Other | Admitting: Internal Medicine

## 2021-07-08 ENCOUNTER — Other Ambulatory Visit: Payer: Self-pay

## 2021-07-08 ENCOUNTER — Encounter: Payer: Self-pay | Admitting: Internal Medicine

## 2021-07-08 VITALS — BP 140/82 | HR 63 | Ht 65.0 in | Wt 184.0 lb

## 2021-07-08 DIAGNOSIS — I480 Paroxysmal atrial fibrillation: Secondary | ICD-10-CM

## 2021-07-08 DIAGNOSIS — I441 Atrioventricular block, second degree: Secondary | ICD-10-CM

## 2021-07-08 DIAGNOSIS — I10 Essential (primary) hypertension: Secondary | ICD-10-CM | POA: Diagnosis not present

## 2021-07-08 DIAGNOSIS — R55 Syncope and collapse: Secondary | ICD-10-CM

## 2021-07-08 DIAGNOSIS — I493 Ventricular premature depolarization: Secondary | ICD-10-CM | POA: Diagnosis not present

## 2021-07-08 MED ORDER — METOPROLOL TARTRATE 50 MG PO TABS: 50.0000 mg | ORAL_TABLET | Freq: Two times a day (BID) | ORAL | 3 refills | Status: DC

## 2021-07-08 NOTE — Patient Instructions (Addendum)
Medication Instructions:  Increase Metoprolol to 50 mg two times a day Your physician recommends that you continue on your current medications as directed. Please refer to the Current Medication list given to you today. *If you need a refill on your cardiac medications before your next appointment, please call your pharmacy*  Lab Work: None. If you have labs (blood work) drawn today and your tests are completely normal, you will receive your results only by: [place] (if you have MyChart) OR A paper copy in the mail If you have any lab test that is abnormal or we need to change your treatment, we will call you to review the results.  Testing/Procedures: None.  Follow-Up: At [facility], you and your health needs are our priority.  As part of our continuing mission to provide you with exceptional heart care, we have created designated Provider Care Teams.  These Care Teams include your primary Cardiologist (physician) and Advanced Practice Providers (APPs -  Physician Assistants and Nurse Practitioners) who all work together to provide you with the care you need, when you need it.  Your physician wants you to follow-up in: 3 months with  Thompson Grayer, MD.     Dennis Bast will receive a reminder letter in the mail two months in advance. If you don't receive a letter, please call our office to schedule the follow-up appointment.  Remote monitoring is used to monitor your Pacemaker from home. This monitoring reduces the number of office visits required to check your device to one time per year. It allows [place] to keep an eye on the functioning of your device to ensure it is working properly. You are scheduled for a device check from home on 09/28/21. You may send your transmission at any time that day. If you have a wireless device, the transmission will be sent automatically. After your physician reviews your transmission, you will receive a postcard with your next transmission date.  We recommend  signing up for the patient portal called "MyChart".  Sign up information is provided on this After Visit Summary.  MyChart is used to connect with patients for Virtual Visits (Telemedicine).  Patients are able to view lab/test results, encounter notes, upcoming appointments, etc.  Non-urgent messages can be sent to your provider as well.   To learn more about what you can do with MyChart, go to [email or web address].    Any Other Special Instructions Will Be Listed Below (If Applicable).

## 2021-07-08 NOTE — Progress Notes (Signed)
PCP: [name], DO Primary Cardiologist: Dr Stanford Breed Primary EP:  Dr Rainey Pines is a 86 y.o. female who presents today for routine electrophysiology followup.  Since last being seen in our clinic, the patient reports doing reasonably well.  She has had intermittent tachypalpitations.  These are associated with SOB and CP.   Her CP is occasionally exertional.  She has recently seen Dr Stanford Breed.  Echo and myoview are low risk.  Today, she denies symptoms of  lower extremity edema, dizziness, presyncope, or syncope.  The patient is otherwise without complaint today.   Past Medical History:  Diagnosis Date   Arthritis    Asthma    Atrial fibrillation (HCC)    paroxysmal , addressed  with metoprolol    Chronic sinusitis    Complication of anesthesia    Dyspnea    sob on exertion, reports at her pre-op today 09-13-2018 that her cardiologist has planned for her to do a ECHO for evaluation    Family history of adverse reaction to anesthesia    sister is hard to wake up from Anesthesia   Follicular lymphoma ([place])    received Chemotherapy (2 years ago)  and radiation (last 12/2017)   Hyperlipidemia    Hypertension    Hypothyroidism    Meniere's disease    PONV (postoperative nausea and vomiting)    Primary localized osteoarthritis of left knee 09/24/2018   Primary localized osteoarthritis of right knee 06/18/2018   Reflux    Past Surgical History:  Procedure Laterality Date   ABDOMINAL HYSTERECTOMY     BONE MARROW BIOPSY  06/16/2014   at [place]     bilateral   CHOLECYSTECTOMY     KNEE ARTHROSCOPY     PACEMAKER IMPLANT N/A 03/30/2020   Procedure: PACEMAKER IMPLANT;  Surgeon: Thompson Grayer, MD;  Location: [place] [facility];  Service: Cardiovascular;  Laterality: N/A;   PARTIAL KNEE ARTHROPLASTY Right 06/18/2018   Procedure: UNICOMPARTMENTAL KNEE;  Surgeon: Marchia Bond, MD;  Location: [facility];  Service: Orthopedics;  Laterality: Right;    PARTIAL KNEE ARTHROPLASTY Left 09/24/2018   Procedure: UNICOMPARTMENTAL KNEE;  Surgeon: Marchia Bond, MD;  Location: [facility];  Service: Orthopedics;  Laterality: Left;    ROS- all systems are reviewed and negative except as per HPI above  Current Outpatient Medications  Medication Sig Dispense Refill   albuterol (VENTOLIN HFA) 108 (90 Base) MCG/ACT inhaler Inhale 2 puffs into the lungs every 6 (six) hours as needed for wheezing or shortness of breath. 8 g 0   amLODipine (NORVASC) 5 MG tablet Take 1 tablet (5 mg total) by mouth daily. 90 tablet 3   aspirin EC 81 MG tablet Take 81 mg by mouth daily.     baclofen (LIORESAL) 10 MG tablet TAKE 1 TABLET BY MOUTH 3  TIMES DAILY AS NEEDED FOR  MUSCLE SPASMS 270 tablet 0   Clobetasol Propionate 0.05 % shampoo Apply 1 application topically daily. As needed for scalp irritation from psoriasis 236 mL 3   fluticasone (FLOVENT HFA) 110 MCG/ACT inhaler Inhale 2 puffs into the lungs 2 (two) times daily as needed (for shortness of breath or wheezing). 3 each 11   gabapentin (NEURONTIN) 100 MG capsule TAKE 1 CAPSULE BY MOUTH  TWICE DAILY AS NEEDED 180 capsule 2   hydrochlorothiazide (HYDRODIURIL) 12.5 MG tablet Take 1 tablet (12.5 mg total) by mouth daily. 90 tablet 3   hydroxychloroquine (PLAQUENIL) 200 MG tablet  Take 200 mg by mouth 2 (two) times daily.      isosorbide mononitrate (IMDUR) 30 MG 24 hr tablet Take 1 tablet (30 mg total) by mouth daily. 90 tablet 3   levothyroxine (SYNTHROID) 112 MCG tablet Take 1 tablet (112 mcg total) by mouth daily before breakfast. 90 tablet 3   losartan (COZAAR) 100 MG tablet Take 1 tablet (100 mg total) by mouth at bedtime. 90 tablet 3   meclizine (ANTIVERT) 25 MG tablet Take 1 tablet (25 mg total) by mouth 3 (three) times daily as needed for dizziness. 30 tablet 0   meloxicam (MOBIC) 15 MG tablet Take 15 mg by mouth daily as needed for pain.     metoprolol tartrate (LOPRESSOR) 25 MG tablet Take 2 tablets (50 mg total)  by mouth 2 (two) times daily. 180 tablet 3   omeprazole (PRILOSEC) 40 MG capsule TAKE 1 CAPSULE BY MOUTH IN  THE MORNING AND MAY REPEAT  DOSE IN THE EVENING AS  NEEDED FOR ACID REFLUX /  INDIGESTION 180 capsule 3   ondansetron (ZOFRAN) 4 MG tablet Take 1 tablet (4 mg total) by mouth every 8 (eight) hours as needed for nausea or vomiting. 10 tablet 0   pravastatin (PRAVACHOL) 80 MG tablet Take 1 tablet (80 mg total) by mouth at bedtime. 90 tablet 3   sennosides-docusate sodium (SENOKOT-S) 8.6-50 MG tablet Take 2 tablets by mouth daily. 30 tablet 1   tetrahydrozoline 0.05 % ophthalmic solution Place 2 drops into both eyes daily as needed (for dry eyes).     triamcinolone (NASACORT ALLERGY 24HR) 55 MCG/ACT AERO nasal inhaler Place 2 sprays into the nose daily. 3 Inhaler 3   No current facility-administered medications for this visit.    Physical Exam: Vitals:   07/08/21 1211  BP: 140/82  Pulse: 63  SpO2: 94%  Weight: 184 lb (83.5 kg)  Height: _0  (1.651 m)    GEN- The patient is well appearing, alert and oriented x 3 today.   Head- normocephalic, atraumatic Eyes-  Sclera clear, conjunctiva pink Ears- hearing intact Oropharynx- clear Lungs- Clear to ausculation bilaterally, normal work of breathing Chest- pacemaker pocket is well healed Heart- Regular rate and rhythm  GI- soft, NT, ND, + BS Extremities- no clubbing, cyanosis, or edema  Pacemaker interrogation- reviewed in detail today,  See PACEART report  ekg tracing ordered today is personally reviewed and shows sinus, first degree AV block  Assessment and Plan:  1. Symptomatic second degree heart block Normal pacemaker function See Pace Art report No changes today she is not device dependant today  2. SVT Noted on device interrogation Though the patient has had NSVt and frequent PVCs previously, review of strips today with Dr Caryl Comes as suggestive of reentrant arrhythmia and possibly AVNRT. Given advanced age, the  patient and her daughter would like to avoid ablation.  We will therefore increase metoprolol to 42m BID.  I will see her back in 3 months for further assessment.  3. HTN Stable No change required today  4. PVCs Followed by Dr WOrvil Feilpreviously  Symptoms are controlled with metoprolol  Return to see me in 3 months   JThompson GrayerMD, FNewton Medical Center12/30/2022 12:32 PM

## 2021-07-13 DIAGNOSIS — C8232 Follicular lymphoma grade IIIa, intrathoracic lymph nodes: Secondary | ICD-10-CM | POA: Diagnosis not present

## 2021-07-14 DIAGNOSIS — I10 Essential (primary) hypertension: Secondary | ICD-10-CM | POA: Diagnosis not present

## 2021-07-14 DIAGNOSIS — Z923 Personal history of irradiation: Secondary | ICD-10-CM | POA: Diagnosis not present

## 2021-07-14 DIAGNOSIS — C8200 Follicular lymphoma grade I, unspecified site: Secondary | ICD-10-CM | POA: Diagnosis not present

## 2021-07-14 DIAGNOSIS — R1310 Dysphagia, unspecified: Secondary | ICD-10-CM | POA: Diagnosis not present

## 2021-07-14 DIAGNOSIS — C8230 Follicular lymphoma grade IIIa, unspecified site: Secondary | ICD-10-CM | POA: Diagnosis not present

## 2021-07-14 DIAGNOSIS — I480 Paroxysmal atrial fibrillation: Secondary | ICD-10-CM | POA: Diagnosis not present

## 2021-07-15 DIAGNOSIS — M7061 Trochanteric bursitis, right hip: Secondary | ICD-10-CM | POA: Diagnosis not present

## 2021-07-15 DIAGNOSIS — M792 Neuralgia and neuritis, unspecified: Secondary | ICD-10-CM | POA: Diagnosis not present

## 2021-07-15 DIAGNOSIS — L600 Ingrowing nail: Secondary | ICD-10-CM | POA: Diagnosis not present

## 2021-07-15 DIAGNOSIS — M7062 Trochanteric bursitis, left hip: Secondary | ICD-10-CM | POA: Diagnosis not present

## 2021-07-18 NOTE — Progress Notes (Signed)
[id number] paroxysmal atrial fibrillation and history of syncope (bradycardia mediated status post pacemaker).  Patient admitted September 2021 with syncopal episode.  Found to be bradycardic and ultimately underwent pacemaker. Carotid Doppler September 2021 showed 1 to 39% bilateral stenosis.  Nuclear study August 2022 showed ejection fraction 40% with normal perfusion.  Echocardiogram August 2022 showed normal LV function, mild left ventricular hypertrophy, grade 1 diastolic dysfunction, mild to moderate aortic insufficiency, mild aortic stenosis with mean gradient 16 mmHg.  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  Since last seen, she denies dyspnea or syncope.  She does occasionally have chest discomfort.  It is substernal radiating to her neck.  Occurs both with activities and rest.  Lasts approximately 10 minutes and resolves.  Some increase with inspiration.  Current Outpatient Medications  Medication Sig Dispense Refill   albuterol (VENTOLIN HFA) 108 (90 Base) MCG/ACT inhaler Inhale 2 puffs into the lungs every 6 (six) hours as needed for wheezing or shortness of breath. 8 g 0   amLODipine (NORVASC) 5 MG tablet Take 1 tablet (5 mg total) by mouth daily. 90 tablet 3   aspirin EC 81 MG tablet Take 81 mg by mouth daily.     baclofen (LIORESAL) 10 MG tablet TAKE 1 TABLET BY MOUTH 3  TIMES DAILY AS NEEDED FOR  MUSCLE SPASMS 270 tablet 0   gabapentin (NEURONTIN) 100 MG capsule TAKE 1 CAPSULE BY MOUTH  TWICE DAILY AS NEEDED 180 capsule 2   hydrochlorothiazide (HYDRODIURIL) 12.5 MG tablet Take 1 tablet (12.5 mg total) by mouth daily. 90 tablet 3   isosorbide mononitrate (IMDUR) 30 MG 24 hr tablet Take 1 tablet (30 mg total) by mouth daily. 90 tablet 3   levothyroxine (SYNTHROID) 112 MCG tablet Take 1 tablet (112 mcg total) by mouth daily before breakfast. 90 tablet 3   losartan (COZAAR) 100 MG tablet Take 1 tablet (100 mg total) by mouth at bedtime. 90 tablet 3    omeprazole (PRILOSEC) 40 MG capsule TAKE 1 CAPSULE BY MOUTH IN  THE MORNING AND MAY REPEAT  DOSE IN THE EVENING AS  NEEDED FOR ACID REFLUX /  INDIGESTION 180 capsule 3   pravastatin (PRAVACHOL) 80 MG tablet Take 1 tablet (80 mg total) by mouth at bedtime. 90 tablet 3   tetrahydrozoline 0.05 % ophthalmic solution Place 2 drops into both eyes daily as needed (for dry eyes).     triamcinolone (NASACORT ALLERGY 24HR) 55 MCG/ACT AERO nasal inhaler Place 2 sprays into the nose daily. 3 Inhaler 3   Clobetasol Propionate 0.05 % shampoo Apply 1 application topically daily. As needed for scalp irritation from psoriasis (Patient not taking: Reported on 07/28/2021) 236 mL 3   fluticasone (FLOVENT HFA) 110 MCG/ACT inhaler Inhale 2 puffs into the lungs 2 (two) times daily as needed (for shortness of breath or wheezing). (Patient not taking: Reported on 07/28/2021) 3 each 11   hydroxychloroquine (PLAQUENIL) 200 MG tablet Take 200 mg by mouth 2 (two) times daily.  (Patient not taking: Reported on 07/28/2021)     meclizine (ANTIVERT) 25 MG tablet Take 1 tablet (25 mg total) by mouth 3 (three) times daily as needed for dizziness. (Patient not taking: Reported on 07/28/2021) 30 tablet 0   meloxicam (MOBIC) 15 MG tablet Take 15 mg by mouth daily as needed for pain. (Patient not taking: Reported on 07/28/2021)     metoprolol tartrate (LOPRESSOR) 50 MG tablet Take 1 tablet (50 mg total) by  mouth 2 (two) times daily. (Patient not taking: Reported on 07/28/2021) 180 tablet 3   ondansetron (ZOFRAN) 4 MG tablet Take 1 tablet (4 mg total) by mouth every 8 (eight) hours as needed for nausea or vomiting. (Patient not taking: Reported on 07/28/2021) 10 tablet 0   sennosides-docusate sodium (SENOKOT-S) 8.6-50 MG tablet Take 2 tablets by mouth daily. (Patient not taking: Reported on 07/28/2021) 30 tablet 1   No current facility-administered medications for this visit.     Past Medical History:  Diagnosis Date   Arthritis    Asthma     Atrial fibrillation (HCC)    paroxysmal , addressed  with metoprolol    Chronic sinusitis    Complication of anesthesia    Dyspnea    sob on exertion, reports at her pre-op today 09-13-2018 that her cardiologist has planned for her to do a ECHO for evaluation    Family history of adverse reaction to anesthesia    sister is hard to wake up from Anesthesia   Follicular lymphoma ([place])    received Chemotherapy (2 years ago)  and radiation (last 12/2017)   Hyperlipidemia    Hypertension    Hypothyroidism    Meniere's disease    PONV (postoperative nausea and vomiting)    Primary localized osteoarthritis of left knee 09/24/2018   Primary localized osteoarthritis of right knee 06/18/2018   Reflux     Past Surgical History:  Procedure Laterality Date   ABDOMINAL HYSTERECTOMY     BONE MARROW BIOPSY  06/16/2014   at [place]     bilateral   CHOLECYSTECTOMY     KNEE ARTHROSCOPY     PACEMAKER IMPLANT N/A 03/30/2020   Procedure: PACEMAKER IMPLANT;  Surgeon: Thompson Grayer, MD;  Location: [place] [facility];  Service: Cardiovascular;  Laterality: N/A;   PARTIAL KNEE ARTHROPLASTY Right 06/18/2018   Procedure: UNICOMPARTMENTAL KNEE;  Surgeon: Marchia Bond, MD;  Location: [facility];  Service: Orthopedics;  Laterality: Right;   PARTIAL KNEE ARTHROPLASTY Left 09/24/2018   Procedure: UNICOMPARTMENTAL KNEE;  Surgeon: Marchia Bond, MD;  Location: [facility];  Service: Orthopedics;  Laterality: Left;    Social History   Socioeconomic History   Marital status: Widowed    Spouse name: Not on file   Number of children: 1   Years of education: 60   Highest education level: 12th grade  Occupational History   Occupation: Theatre manager    Comment: retired  Tobacco Use   Smoking status: Never   Smokeless tobacco: Never  Vaping Use   Vaping Use: Never used  Substance and Sexual Activity   Alcohol use: No   Drug use: No   Sexual activity: Not Currently    Partners: Male   Other Topics Concern   Not on file  Social History Narrative   Not on file   Social Determinants of Health   Financial Resource Strain: Not on file  Food Insecurity: Not on file  Transportation Needs: Not on file  Physical Activity: Not on file  Stress: Not on file  Social Connections: Not on file  Intimate Partner Violence: Not on file    Family History  Problem Relation Age of Onset   Heart disease Other    Skin cancer Mother    Stroke Mother    Prostate cancer Father    High blood pressure Father    Heart attack Father     ROS: no fevers or chills, productive cough, hemoptysis, dysphasia,  odynophagia, melena, hematochezia, dysuria, hematuria, rash, seizure activity, orthopnea, PND, pedal edema, claudication. Remaining systems are negative.  Physical Exam: Well-developed well-nourished in no acute distress.  Skin is warm and dry.  HEENT is normal.  Neck is supple.  Chest is clear to auscultation with normal expansion.  Cardiovascular exam is regular rate and rhythm.  2/6 systolic murmur left sternal border. Abdominal exam nontender or distended. No masses palpated. Extremities show no edema. neuro grossly intact  ECG-July 08, 2021-atrial paced with first-degree AV block and right bundle branch block, no ST changes.  Personally reviewed  Today's electrocardiogram personally reviewed and shows atrial paced rhythm with occasional PVCs, first-degree AV block, right bundle branch block.  A/P  1 aortic stenosis/aortic insufficiency-plan to repeat echocardiogram August 2023.  May need TAVR in the future.  2 paroxysmal atrial fibrillation-this was noted previously on device check and per Dr. Rayann Heman was short-lived.  3 history of pacemaker-managed by electrophysiology.  4 hypertension-blood pressure elevated; increase amlodipine to 10 mg daily and follow.  5 history of PVCs-continue beta-blocker.  6 AVNRT-she occasionally has palpitations.  We will continue  higher dose beta-blocker which was adjusted by Dr. Rayann Heman.  Symptoms are somewhat improved though persist.  7 chest pain-etiology unclear.  Her electrocardiograms today and in December showed no new ST changes.  Previous nuclear study showed no ischemia.  We will follow for now.  Kirk Ruths, MD

## 2021-07-21 DIAGNOSIS — Z79899 Other long term (current) drug therapy: Secondary | ICD-10-CM | POA: Diagnosis not present

## 2021-07-21 DIAGNOSIS — M545 Low back pain, unspecified: Secondary | ICD-10-CM | POA: Diagnosis not present

## 2021-07-21 DIAGNOSIS — Z6830 Body mass index (BMI) 30.0-30.9, adult: Secondary | ICD-10-CM | POA: Diagnosis not present

## 2021-07-21 DIAGNOSIS — M3500 Sicca syndrome, unspecified: Secondary | ICD-10-CM | POA: Diagnosis not present

## 2021-07-21 DIAGNOSIS — R768 Other specified abnormal immunological findings in serum: Secondary | ICD-10-CM | POA: Diagnosis not present

## 2021-07-21 DIAGNOSIS — M150 Primary generalized (osteo)arthritis: Secondary | ICD-10-CM | POA: Diagnosis not present

## 2021-07-21 DIAGNOSIS — E669 Obesity, unspecified: Secondary | ICD-10-CM | POA: Diagnosis not present

## 2021-07-21 DIAGNOSIS — M2550 Pain in unspecified joint: Secondary | ICD-10-CM | POA: Diagnosis not present

## 2021-07-21 DIAGNOSIS — M791 Myalgia, unspecified site: Secondary | ICD-10-CM | POA: Diagnosis not present

## 2021-07-21 DIAGNOSIS — R5383 Other fatigue: Secondary | ICD-10-CM | POA: Diagnosis not present

## 2021-07-28 ENCOUNTER — Encounter: Payer: Self-pay | Admitting: Cardiology

## 2021-07-28 ENCOUNTER — Other Ambulatory Visit: Payer: Self-pay

## 2021-07-28 ENCOUNTER — Ambulatory Visit (INDEPENDENT_AMBULATORY_CARE_PROVIDER_SITE_OTHER): Payer: Medicare Other | Admitting: Cardiology

## 2021-07-28 VITALS — BP 152/62 | HR 66 | Ht 65.0 in | Wt 187.4 lb

## 2021-07-28 DIAGNOSIS — I350 Nonrheumatic aortic (valve) stenosis: Secondary | ICD-10-CM | POA: Diagnosis not present

## 2021-07-28 DIAGNOSIS — Z950 Presence of cardiac pacemaker: Secondary | ICD-10-CM | POA: Diagnosis not present

## 2021-07-28 DIAGNOSIS — I10 Essential (primary) hypertension: Secondary | ICD-10-CM | POA: Diagnosis not present

## 2021-07-28 DIAGNOSIS — R072 Precordial pain: Secondary | ICD-10-CM | POA: Diagnosis not present

## 2021-07-28 MED ORDER — AMLODIPINE BESYLATE 10 MG PO TABS: 10.0000 mg | ORAL_TABLET | Freq: Every day | ORAL | 3 refills | Status: DC

## 2021-07-28 NOTE — Patient Instructions (Signed)
Medication Instructions:   INCREASE AMLODIPINE TO 10 MG ONCE DAILY= 2 OF THE 5 MG TABLETS ONCE DAILY  *If you need a refill on your cardiac medications before your next appointment, please call your pharmacy*   Follow-Up: At [facility], you and your health needs are our priority.  As part of our continuing mission to provide you with exceptional heart care, we have created designated Provider Care Teams.  These Care Teams include your primary Cardiologist (physician) and Advanced Practice Providers (APPs -  Physician Assistants and Nurse Practitioners) who all work together to provide you with the care you need, when you need it.  We recommend signing up for the patient portal called "MyChart".  Sign up information is provided on this After Visit Summary.  MyChart is used to connect with patients for Virtual Visits (Telemedicine).  Patients are able to view lab/test results, encounter notes, upcoming appointments, etc.  Non-urgent messages can be sent to your provider as well.   To learn more about what you can do with MyChart, go to [email or web address].    Your next appointment:   6 month(s)  The format for your next appointment:   In Person  Provider:   Kirk Ruths MD

## 2021-07-29 DIAGNOSIS — M792 Neuralgia and neuritis, unspecified: Secondary | ICD-10-CM | POA: Diagnosis not present

## 2021-07-29 DIAGNOSIS — L600 Ingrowing nail: Secondary | ICD-10-CM | POA: Diagnosis not present

## 2021-08-08 ENCOUNTER — Encounter: Payer: Self-pay | Admitting: Internal Medicine

## 2021-08-08 DIAGNOSIS — L814 Other melanin hyperpigmentation: Secondary | ICD-10-CM | POA: Diagnosis not present

## 2021-08-08 DIAGNOSIS — R072 Precordial pain: Secondary | ICD-10-CM

## 2021-08-08 DIAGNOSIS — D692 Other nonthrombocytopenic purpura: Secondary | ICD-10-CM | POA: Diagnosis not present

## 2021-08-08 DIAGNOSIS — L821 Other seborrheic keratosis: Secondary | ICD-10-CM | POA: Diagnosis not present

## 2021-08-08 DIAGNOSIS — L400 Psoriasis vulgaris: Secondary | ICD-10-CM | POA: Diagnosis not present

## 2021-08-09 MED ORDER — AMLODIPINE BESYLATE 5 MG PO TABS: 5.0000 mg | ORAL_TABLET | Freq: Every day | ORAL | 3 refills | Status: DC

## 2021-09-09 ENCOUNTER — Encounter: Payer: Self-pay | Admitting: Medical-Surgical

## 2021-09-22 ENCOUNTER — Encounter: Payer: Self-pay | Admitting: Medical-Surgical

## 2021-09-22 ENCOUNTER — Other Ambulatory Visit: Payer: Self-pay

## 2021-09-22 ENCOUNTER — Ambulatory Visit (INDEPENDENT_AMBULATORY_CARE_PROVIDER_SITE_OTHER): Payer: Medicare Other | Admitting: Medical-Surgical

## 2021-09-22 VITALS — BP 184/79 | HR 68 | Resp 16 | Ht 65.0 in | Wt 186.0 lb

## 2021-09-22 DIAGNOSIS — Z780 Asymptomatic menopausal state: Secondary | ICD-10-CM

## 2021-09-22 DIAGNOSIS — N904 Leukoplakia of vulva: Secondary | ICD-10-CM | POA: Diagnosis not present

## 2021-09-22 DIAGNOSIS — R350 Frequency of micturition: Secondary | ICD-10-CM | POA: Diagnosis not present

## 2021-09-22 DIAGNOSIS — E559 Vitamin D deficiency, unspecified: Secondary | ICD-10-CM

## 2021-09-22 DIAGNOSIS — R300 Dysuria: Secondary | ICD-10-CM | POA: Diagnosis not present

## 2021-09-22 DIAGNOSIS — K5792 Diverticulitis of intestine, part unspecified, without perforation or abscess without bleeding: Secondary | ICD-10-CM

## 2021-09-22 LAB — POCT URINALYSIS DIP (CLINITEK)
Bilirubin, UA: NEGATIVE
Blood, UA: NEGATIVE
Glucose, UA: NEGATIVE mg/dL
Ketones, POC UA: NEGATIVE mg/dL
Nitrite, UA: POSITIVE — AB
POC PROTEIN,UA: NEGATIVE
Spec Grav, UA: 1.02 (ref 1.010–1.025)
Urobilinogen, UA: 0.2 E.U./dL
pH, UA: 6.5 (ref 5.0–8.0)

## 2021-09-22 MED ORDER — AMOXICILLIN-POT CLAVULANATE 875-125 MG PO TABS: 1.0000 | ORAL_TABLET | Freq: Three times a day (TID) | ORAL | 0 refills | Status: AC

## 2021-09-22 MED ORDER — MOMETASONE FUROATE 0.1 % EX CREA: 1.0000 "application " | TOPICAL_CREAM | Freq: Every day | CUTANEOUS | 1 refills | Status: DC

## 2021-09-22 NOTE — Progress Notes (Signed)
?  HPI with pertinent ROS:  ? ?CC: Transfer of care ? ?HPI: ?Very pleasant 87 year old female accompanied by her daughter presenting to transfer care to a new PCP. ? ?Vitamin D deficiency-would like to have her vitamin D level checked today.  She does have known vitamin D deficiency as well as osteopenia.   ? ?Vaginal irritation-requesting a refill of mometasone 0.1% cream since using this as needed helps with vaginal itching and irritation as well as lichen sclerosus. ? ?Urinary symptoms-has noticed an increase in urinary frequency as well as nocturia and urethral irritation.  Would like to have her urine checked today. ? ?Has a history of diverticulosis with intermittent bouts of diverticulitis.  Notes that about 7 to 10 days ago, she had a flare of diverticulitis that caused significant diarrhea and abdominal discomfort. ? ? ?I reviewed the past medical history, family history, social history, surgical history, and allergies today and no changes were needed.  Please see the problem list section below in epic for further details. ? ? ?Physical exam:  ? ?General: Well Developed, well nourished, and in no acute distress.  ?Neuro: Alert and oriented x3.  ?HEENT: Normocephalic, atraumatic.  ?Skin: Warm and dry. ?Cardiac: Regular rate and rhythm, no murmurs rubs or gallops, no lower extremity edema.  ?Respiratory: Clear to auscultation bilaterally. Not using accessory muscles, speaking in full sentences. ?Abdomen: Soft, mild LLQ tenderness, nondistended. Bowel sounds + x 4 quadrants. No HSM appreciated. ? ?Impression and Recommendations:   ? ?1. Vitamin D deficiency ?Checking vitamin D today. ?- Vitamin D (25 hydroxy) ? ?2. Dysuria ?3. Urinary frequency ?POCT urinalysis positive for nitrites and leukocytes.  Sending for culture.  We will go ahead and treat with Augmentin. ?- POCT URINALYSIS DIP (CLINITEK) ?- POCT Urinalysis Dipstick ?- Urine Culture ? ?4. Postmenopausal ?5. Lichen sclerosus of female genitalia ?Continue  sparing use of mometasone cream to the affected area. ?- mometasone (ELOCON) 0.1 % cream; Apply 1 application. topically daily. To vaginal area as needed for redness/irritation. Can use twice daily for the first week  Dispense: 100 g; Refill: 1 ? ?6. Diverticulitis ?Discussed the current recommendations for treatment of diverticulitis.  She does continue to have symptoms although they have somewhat improved.  Since its been greater than a week with dietary modifications and conservative measures, may need further intervention.  Since she does have a urinary tract infection, we will use Augmentin to treat both issues. ? ?Return in about 2 weeks (around 10/06/2021) for nurse visit for BP check. ?___________________________________________ ?Clearnce Sorrel, DNP, APRN, FNP-BC ?Primary Care and Sports Medicine ?[place] ?

## 2021-09-23 LAB — VITAMIN D 25 HYDROXY (VIT D DEFICIENCY, FRACTURES): Vit D, 25-Hydroxy: 27 ng/mL — ABNORMAL LOW (ref 30–100)

## 2021-09-24 LAB — URINE CULTURE
MICRO NUMBER:: 13144032
SPECIMEN QUALITY:: ADEQUATE

## 2021-09-26 DIAGNOSIS — M19031 Primary osteoarthritis, right wrist: Secondary | ICD-10-CM | POA: Diagnosis not present

## 2021-09-28 ENCOUNTER — Ambulatory Visit (INDEPENDENT_AMBULATORY_CARE_PROVIDER_SITE_OTHER): Payer: Medicare Other

## 2021-09-28 DIAGNOSIS — I441 Atrioventricular block, second degree: Secondary | ICD-10-CM | POA: Diagnosis not present

## 2021-09-28 LAB — CUP PACEART REMOTE DEVICE CHECK
Battery Remaining Longevity: 165 mo
Battery Voltage: 3.06 V
Brady Statistic AP VP Percent: 0.05 %
Brady Statistic AP VS Percent: 15.4 %
Brady Statistic AS VP Percent: 0.03 %
Brady Statistic AS VS Percent: 84.52 %
Brady Statistic RA Percent Paced: 19.29 %
Brady Statistic RV Percent Paced: 0.09 %
Date Time Interrogation Session: 20230322005934
Implantable Lead Implant Date: 20210921
Implantable Lead Implant Date: 20210921
Implantable Lead Location: 753859
Implantable Lead Location: 753860
Implantable Lead Model: 3830
Implantable Lead Model: 5076
Implantable Pulse Generator Implant Date: 20210921
Lead Channel Impedance Value: 304 Ohm
Lead Channel Impedance Value: 361 Ohm
Lead Channel Impedance Value: 532 Ohm
Lead Channel Impedance Value: 551 Ohm
Lead Channel Pacing Threshold Amplitude: 0.75 V
Lead Channel Pacing Threshold Amplitude: 1 V
Lead Channel Pacing Threshold Pulse Width: 0.4 ms
Lead Channel Pacing Threshold Pulse Width: 0.4 ms
Lead Channel Sensing Intrinsic Amplitude: 14 mV
Lead Channel Sensing Intrinsic Amplitude: 14 mV
Lead Channel Sensing Intrinsic Amplitude: 3.75 mV
Lead Channel Sensing Intrinsic Amplitude: 3.75 mV
Lead Channel Setting Pacing Amplitude: 2 V
Lead Channel Setting Pacing Amplitude: 2 V
Lead Channel Setting Pacing Pulse Width: 0.4 ms
Lead Channel Setting Sensing Sensitivity: 0.9 mV

## 2021-10-11 NOTE — Progress Notes (Signed)
Remote pacemaker transmission.   

## 2021-10-13 ENCOUNTER — Ambulatory Visit: Payer: Medicare Other | Admitting: Internal Medicine

## 2021-10-18 DIAGNOSIS — M47816 Spondylosis without myelopathy or radiculopathy, lumbar region: Secondary | ICD-10-CM | POA: Diagnosis not present

## 2021-10-18 DIAGNOSIS — M48062 Spinal stenosis, lumbar region with neurogenic claudication: Secondary | ICD-10-CM | POA: Diagnosis not present

## 2021-10-20 DIAGNOSIS — Z8572 Personal history of non-Hodgkin lymphomas: Secondary | ICD-10-CM | POA: Diagnosis not present

## 2021-10-20 DIAGNOSIS — C8200 Follicular lymphoma grade I, unspecified site: Secondary | ICD-10-CM | POA: Diagnosis not present

## 2021-10-20 DIAGNOSIS — R1310 Dysphagia, unspecified: Secondary | ICD-10-CM | POA: Diagnosis not present

## 2021-10-20 DIAGNOSIS — Z09 Encounter for follow-up examination after completed treatment for conditions other than malignant neoplasm: Secondary | ICD-10-CM | POA: Diagnosis not present

## 2021-10-20 DIAGNOSIS — I10 Essential (primary) hypertension: Secondary | ICD-10-CM | POA: Diagnosis not present

## 2021-10-20 DIAGNOSIS — I4891 Unspecified atrial fibrillation: Secondary | ICD-10-CM | POA: Diagnosis not present

## 2021-10-20 DIAGNOSIS — Z923 Personal history of irradiation: Secondary | ICD-10-CM | POA: Diagnosis not present

## 2021-10-20 DIAGNOSIS — R5383 Other fatigue: Secondary | ICD-10-CM | POA: Diagnosis not present

## 2021-10-26 DIAGNOSIS — M79644 Pain in right finger(s): Secondary | ICD-10-CM | POA: Diagnosis not present

## 2021-10-27 ENCOUNTER — Other Ambulatory Visit: Payer: Self-pay | Admitting: Cardiology

## 2021-10-31 DIAGNOSIS — M19031 Primary osteoarthritis, right wrist: Secondary | ICD-10-CM | POA: Diagnosis not present

## 2021-10-31 DIAGNOSIS — M1811 Unilateral primary osteoarthritis of first carpometacarpal joint, right hand: Secondary | ICD-10-CM | POA: Diagnosis not present

## 2021-10-31 DIAGNOSIS — M25531 Pain in right wrist: Secondary | ICD-10-CM | POA: Diagnosis not present

## 2021-11-02 DIAGNOSIS — M48062 Spinal stenosis, lumbar region with neurogenic claudication: Secondary | ICD-10-CM | POA: Diagnosis not present

## 2021-11-21 ENCOUNTER — Encounter: Payer: Self-pay | Admitting: Cardiology

## 2021-11-21 DIAGNOSIS — R072 Precordial pain: Secondary | ICD-10-CM

## 2021-11-22 DIAGNOSIS — M48062 Spinal stenosis, lumbar region with neurogenic claudication: Secondary | ICD-10-CM | POA: Diagnosis not present

## 2021-11-22 DIAGNOSIS — M542 Cervicalgia: Secondary | ICD-10-CM | POA: Diagnosis not present

## 2021-11-22 DIAGNOSIS — M47816 Spondylosis without myelopathy or radiculopathy, lumbar region: Secondary | ICD-10-CM | POA: Diagnosis not present

## 2021-11-22 DIAGNOSIS — M47812 Spondylosis without myelopathy or radiculopathy, cervical region: Secondary | ICD-10-CM | POA: Diagnosis not present

## 2021-11-22 MED ORDER — METOPROLOL TARTRATE 50 MG PO TABS: 75.0000 mg | ORAL_TABLET | Freq: Two times a day (BID) | ORAL | 3 refills | Status: DC

## 2021-11-22 NOTE — Addendum Note (Signed)
Addended by: Cristopher Estimable on: 11/22/2021 11:34 AM ? ? Modules accepted: Orders ? ?

## 2021-11-22 NOTE — Telephone Encounter (Signed)
Transmission received  ? ?Normal pacemaker function ? ?20 Fast SVT  episodes longest episode 43mn8seconds in duration average A/V 178/170bpm EGMs illustrate V-A conduction ? ?16 Monitored VT episode EGMs illustrate same as SVT episodes V-A conduction ? ?Patient's daughter reported compliance with all medications including Metoprolol '50mg'$  BID. Patients daughter felt that her BP was causing more on an issue than patient heart rate.  ? ?These types episodes are not  a new finding for this patient.  ? ?Earliest EP apt Nov 30 2021 with AOda KiltsPA.  ? ?Routing back to [facility] for possible earlier apt.   ?

## 2021-11-24 ENCOUNTER — Encounter: Payer: Self-pay | Admitting: Physician Assistant

## 2021-11-24 ENCOUNTER — Ambulatory Visit (INDEPENDENT_AMBULATORY_CARE_PROVIDER_SITE_OTHER): Payer: Medicare Other | Admitting: Physician Assistant

## 2021-11-24 VITALS — BP 139/82 | HR 74 | Ht 65.0 in | Wt 185.0 lb

## 2021-11-24 DIAGNOSIS — J309 Allergic rhinitis, unspecified: Secondary | ICD-10-CM

## 2021-11-24 DIAGNOSIS — R300 Dysuria: Secondary | ICD-10-CM | POA: Diagnosis not present

## 2021-11-24 LAB — POCT URINALYSIS DIP (CLINITEK)
Bilirubin, UA: NEGATIVE
Blood, UA: NEGATIVE
Glucose, UA: NEGATIVE mg/dL
Ketones, POC UA: NEGATIVE mg/dL
Leukocytes, UA: NEGATIVE
Nitrite, UA: NEGATIVE
POC PROTEIN,UA: NEGATIVE
Spec Grav, UA: 1.025 (ref 1.010–1.025)
Urobilinogen, UA: 0.2 E.U./dL
pH, UA: 5.5 (ref 5.0–8.0)

## 2021-11-24 MED ORDER — AMOXICILLIN-POT CLAVULANATE 875-125 MG PO TABS: 1.0000 | ORAL_TABLET | Freq: Two times a day (BID) | ORAL | 0 refills | Status: DC

## 2021-11-24 MED ORDER — METHYLPREDNISOLONE SODIUM SUCC 125 MG IJ SOLR
125.0000 mg | Freq: Once | INTRAMUSCULAR | Status: AC
  Administered 2021-11-24: 125 mg via INTRAMUSCULAR

## 2021-11-24 MED ORDER — PHENAZOPYRIDINE HCL 200 MG PO TABS: 200.0000 mg | ORAL_TABLET | Freq: Three times a day (TID) | ORAL | 0 refills | Status: AC

## 2021-11-24 NOTE — Patient Instructions (Addendum)

## 2021-11-24 NOTE — Progress Notes (Signed)
Acute Office Visit  Subjective:     Patient ID: [name], female    DOB: 05/06/1935, 87 y.o.   MRN: [id number]  Chief Complaint  Patient presents with   Dysuria    HPI Patient is in today for dysuria for the last 2 weeks. 3/16 she was treated for E.coli in urine with Augmentin. Symptoms did clear but now came back. No fever, chills, abdominal or flank pain, vomiting, diarrhea. She has had some intermittent nausea and chills. No urine odor. Not taken anything to make better.   She has hx of chronic sinuitis but worsened over the past week. Uses nasocort some. Takes claritin daily. Blowing out clear phlegm. No fever, ST. She does have some head pressure.   .. Active Ambulatory Problems    Diagnosis Date Noted   Primary localized osteoarthritis of right knee 06/18/2018   Status post right partial knee replacement 06/18/2018   Hypothyroidism 01/14/2014   Benign essential hypertension 09/15/2015   Carpal tunnel syndrome on both sides 06/30/2013   Class 1 obesity due to excess calories with serious comorbidity and body mass index (BMI) of 30.0 to 30.9 in adult 07/16/2017   DDD (degenerative disc disease), cervical 07/16/2017   Dyspnea 12/25/2012   Fatigue 28/76/8115   Follicular lymphoma ([place]) 12/18/2014   GERD (gastroesophageal reflux disease) 07/16/2017   Myalgia 09/03/2018   Nasal polyposis 05/16/2018   Other hyperlipidemia 09/27/2016   Paroxysmal atrial fibrillation ([place]) 09/27/2016   Vertigo 09/03/2018   Sebaceous cyst 09/03/2018   Callus of foot 09/03/2018   Primary localized osteoarthritis of left knee 09/24/2018   S/P left unicompartmental knee replacement 09/24/2018   MTHFR gene mutation 10/09/2018   Lymphoma ([place]) 07/28/2014   Acquired trigger finger 08/05/2013   Shingles 10/24/2019   Syncope 03/28/2020   Heart block 04/07/2020   Pacemaker 04/07/2020   Asthma without status asthmaticus 03/21/2021   Osteopenia 04/29/2021   Resolved Ambulatory Problems     Diagnosis Date Noted   No Resolved Ambulatory Problems   Past Medical History:  Diagnosis Date   Arthritis    Asthma    Atrial fibrillation ([place])    Chronic sinusitis    Complication of anesthesia    Family history of adverse reaction to anesthesia    Hyperlipidemia    Hypertension    Meniere's disease    PONV (postoperative nausea and vomiting)    Reflux      ROS  See HPI.     Objective:    BP 139/82   Pulse 74   Ht '5\' 5"'$  (1.651 m)   Wt 185 lb (83.9 kg)   SpO2 99%   BMI 30.79 kg/m  BP Readings from Last 3 Encounters:  11/24/21 139/82  09/22/21 (!) 184/79  07/28/21 (!) 152/62    .Marland Kitchen Results for orders placed or performed in visit on 11/24/21  Urine Culture   Specimen: Urine  Result Value Ref Range   MICRO NUMBER: [id number]    SPECIMEN QUALITY: Adequate    Sample Source NOT GIVEN    STATUS: FINAL    Result: No Growth   POCT URINALYSIS DIP (CLINITEK)  Result Value Ref Range   Color, UA yellow yellow   Clarity, UA clear clear   Glucose, UA negative negative mg/dL   Bilirubin, UA negative negative   Ketones, POC UA negative negative mg/dL   Spec Grav, UA 1.025 1.010 - 1.025   Blood, UA negative negative   pH, UA 5.5 5.0 - 8.0  POC PROTEIN,UA negative negative, trace   Urobilinogen, UA 0.2 0.2 or 1.0 E.U./dL   Nitrite, UA Negative Negative   Leukocytes, UA Negative Negative     Physical Exam Vitals reviewed.  Constitutional:      Appearance: Normal appearance. She is obese.  HENT:     Head: Normocephalic.     Right Ear: Tympanic membrane, ear canal and external ear normal. There is no impacted cerumen.     Left Ear: Tympanic membrane, ear canal and external ear normal. There is no impacted cerumen.     Nose: Congestion present.     Mouth/Throat:     Mouth: Mucous membranes are moist.  Eyes:     Extraocular Movements: Extraocular movements intact.     Conjunctiva/sclera: Conjunctivae normal.     Pupils: Pupils are equal, round, and reactive to  light.  Cardiovascular:     Rate and Rhythm: Normal rate and regular rhythm.     Pulses: Normal pulses.     Heart sounds: Normal heart sounds.  Pulmonary:     Effort: Pulmonary effort is normal.     Breath sounds: Normal breath sounds.  Abdominal:     General: Bowel sounds are normal. There is no distension.     Palpations: Abdomen is soft. There is no mass.     Tenderness: There is no abdominal tenderness. There is no right CVA tenderness, left CVA tenderness, guarding or rebound.  Neurological:     General: No focal deficit present.     Mental Status: She is alert and oriented to person, place, and time.  Psychiatric:        Mood and Affect: Mood normal.          Assessment & Plan:  Marland KitchenMarland KitchenPearl was seen today for dysuria.  Diagnoses and all orders for this visit:  Dysuria -     POCT URINALYSIS DIP (CLINITEK) -     Urine Culture -     amoxicillin-clavulanate (AUGMENTIN) 875-125 MG tablet; Take 1 tablet by mouth 2 (two) times daily. -     phenazopyridine (PYRIDIUM) 200 MG tablet; Take 1 tablet (200 mg total) by mouth 3 (three) times daily for 2 days.  Allergic sinusitis -     methylPREDNISolone sodium succinate (SOLU-MEDROL) 125 mg/2 mL injection 125 mg   No red flags seen today UA negative for blood, leuks, protein, nitrites Hold abx and will culture Start pyridium Increase hydration Follow up as needed Given augmentin if spikes fever, dysuria gets worse.   Allergic sinusitis discussed nasal saline rinses Solumedrol given today Continue on anti-histamine If sinus pressure and symptoms persist past 1 week start augmentin.   Return if symptoms worsen or fail to improve.  Iran Planas, PA-C

## 2021-11-25 MED ORDER — AMLODIPINE BESYLATE 10 MG PO TABS: 10.0000 mg | ORAL_TABLET | Freq: Every day | ORAL | Status: DC

## 2021-11-25 NOTE — Addendum Note (Signed)
Addended by: Cristopher Estimable on: 11/25/2021 08:26 AM   Modules accepted: Orders

## 2021-11-26 LAB — URINE CULTURE
MICRO NUMBER:: 13418785
Result:: NO GROWTH
SPECIMEN QUALITY:: ADEQUATE

## 2021-11-28 ENCOUNTER — Encounter: Payer: Self-pay | Admitting: Physician Assistant

## 2021-11-28 NOTE — Progress Notes (Signed)
No growth on urine culture. Did you ever start augmentin? Did the pyridium help? Dysuria does not appear to be coming from bacterial infection. Follow up with PCP if dysuria persist.

## 2021-11-30 ENCOUNTER — Ambulatory Visit: Payer: Medicare Other | Admitting: Internal Medicine

## 2021-12-15 DIAGNOSIS — L400 Psoriasis vulgaris: Secondary | ICD-10-CM | POA: Diagnosis not present

## 2021-12-16 ENCOUNTER — Telehealth: Payer: Self-pay | Admitting: Cardiology

## 2021-12-16 MED ORDER — METOPROLOL TARTRATE 25 MG PO TABS: 25.0000 mg | ORAL_TABLET | Freq: Two times a day (BID) | ORAL | 3 refills | Status: DC

## 2021-12-16 MED ORDER — METOPROLOL TARTRATE 50 MG PO TABS
50.0000 mg | ORAL_TABLET | Freq: Two times a day (BID) | ORAL | 3 refills | Status: DC
Start: 2021-12-16 — End: 2022-01-04

## 2021-12-16 NOTE — Telephone Encounter (Signed)
Pt c/o medication issue:  1. Name of Medication: metoprolol tartrate (LOPRESSOR) 50 MG tablet  2. How are you currently taking this medication (dosage and times per day)? Take 1.5 tablets (75 mg total) by mouth 2 (two) times daily.  3. Are you having a reaction (difficulty breathing--STAT)? no  4. What is your medication issue? Calling in to make sure that is that she getting the correct dosage of medication. She needs a refill but unsure if its correct

## 2021-12-16 NOTE — Telephone Encounter (Signed)
*  STAT* If patient is at the pharmacy, call can be transferred to refill team.   1. Which medications need to be refilled? (please list name of each medication and dose if known) metoprolol tartrate (LOPRESSOR) 25 MG tablet (75 MG twice daily)  2. Which pharmacy/location (including street and city if local pharmacy) is medication to be sent to? [facility] ([place], [place]  3. Do they need a 30 day or 90 day supply?  90 day supply  Patient states she is completely out of medication.

## 2021-12-16 NOTE — Telephone Encounter (Signed)
Spoke with pt, she is having trouble cutting the 5 mg tablet in 1/2 so new script for 50 mg and 25 mg called into the pharmacy. The patient is taking 75 mg twice daily.

## 2021-12-19 DIAGNOSIS — M48062 Spinal stenosis, lumbar region with neurogenic claudication: Secondary | ICD-10-CM | POA: Diagnosis not present

## 2021-12-23 ENCOUNTER — Ambulatory Visit (INDEPENDENT_AMBULATORY_CARE_PROVIDER_SITE_OTHER): Payer: Medicare Other | Admitting: Internal Medicine

## 2021-12-23 ENCOUNTER — Encounter: Payer: Self-pay | Admitting: Internal Medicine

## 2021-12-23 VITALS — BP 142/70 | HR 64 | Ht 65.0 in | Wt 185.2 lb

## 2021-12-23 DIAGNOSIS — Z950 Presence of cardiac pacemaker: Secondary | ICD-10-CM | POA: Diagnosis not present

## 2021-12-23 DIAGNOSIS — I459 Conduction disorder, unspecified: Secondary | ICD-10-CM

## 2021-12-23 DIAGNOSIS — I10 Essential (primary) hypertension: Secondary | ICD-10-CM | POA: Diagnosis not present

## 2021-12-23 DIAGNOSIS — I471 Supraventricular tachycardia, unspecified: Secondary | ICD-10-CM | POA: Insufficient documentation

## 2021-12-23 MED ORDER — METOPROLOL TARTRATE 25 MG PO TABS: 25.0000 mg | ORAL_TABLET | Freq: Every evening | ORAL | 3 refills | Status: DC

## 2021-12-23 NOTE — Patient Instructions (Addendum)
Medication Instructions:  Your physician has recommended you make the following change in your medication:   1.  You will change how you take your Metoprolol Tartrate.    A. You will take Metoprolol Tartrate 50 mg, take one tablet by mouth in the morning.   B. You will take Metoprolol Tartrate 50 mg and 25 mg by mouth in the evening.     Lab Work: None ordered. If you have labs (blood work) drawn today and your tests are completely normal, you will receive your results only by: [place] (if you have MyChart) OR A paper copy in the mail If you have any lab test that is abnormal or we need to change your treatment, we will call you to review the results.  Testing/Procedures: None ordered.  Follow-Up: At [facility], you and your health needs are our priority.  As part of our continuing mission to provide you with exceptional heart care, we have created designated Provider Care Teams.  These Care Teams include your primary Cardiologist (physician) and Advanced Practice Providers (APPs -  Physician Assistants and Nurse Practitioners) who all work together to provide you with the care you need, when you need it.  Your next appointment:    Your physician wants you to follow-up in: 6 months with Tommye Standard, PA-C  You will receive a reminder letter in the mail two months in advance. If you don't receive a letter, please call our office to schedule the follow-up appointment.  Remote monitoring is used to monitor your Pacemaker from home. This monitoring reduces the number of office visits required to check your device to one time per year. It allows [place] to keep an eye on the functioning of your device to ensure it is working properly. You are scheduled for a device check from home on 12/28/2021. You may send your transmission at any time that day. If you have a wireless device, the transmission will be sent automatically. After your physician reviews your transmission, you will receive a  postcard with your next transmission date.   Important Information About [place]

## 2021-12-23 NOTE — Progress Notes (Signed)
PCP: Samuel Bouche, NP Primary Cardiologist: Dr Stanford Breed Primary EP:  Dr Rainey Pines is a 87 y.o. female who presents today for routine electrophysiology followup.  Since last being seen in our clinic, the patient reports doing very well.  She is active for her age.  She has rare abrupt onset/offset tachypalpitations with associated chest discomfort due to SVT.  She continues to have labile blood pressure for which she is following with Dr Stanford Breed. Today, she denies symptoms of palpitations, chest pain, shortness of breath,  lower extremity edema, dizziness, presyncope, or syncope.  The patient is otherwise without complaint today.   Past Medical History:  Diagnosis Date   Arthritis    Asthma    Atrial fibrillation ([facility])    paroxysmal , addressed  with metoprolol    Chronic sinusitis    Complication of anesthesia    Dyspnea    sob on exertion, reports at her pre-op today 09-13-2018 that her cardiologist has planned for her to do a ECHO for evaluation    Family history of adverse reaction to anesthesia    sister is hard to wake up from Anesthesia   Follicular lymphoma ([place])    received Chemotherapy (2 years ago)  and radiation (last 12/2017)   Hyperlipidemia    Hypertension    Hypothyroidism    Meniere's disease    PONV (postoperative nausea and vomiting)    Primary localized osteoarthritis of left knee 09/24/2018   Primary localized osteoarthritis of right knee 06/18/2018   Reflux    Past Surgical History:  Procedure Laterality Date   ABDOMINAL HYSTERECTOMY     BONE MARROW BIOPSY  06/16/2014   at [facility]     bilateral   CHOLECYSTECTOMY     KNEE ARTHROSCOPY     PACEMAKER IMPLANT N/A 03/30/2020   Procedure: PACEMAKER IMPLANT;  Surgeon: Thompson Grayer, MD;  Location: [facility];  Service: Cardiovascular;  Laterality: N/A;   PARTIAL KNEE ARTHROPLASTY Right 06/18/2018   Procedure: UNICOMPARTMENTAL KNEE;  Surgeon: Marchia Bond, MD;   Location: [facility];  Service: Orthopedics;  Laterality: Right;   PARTIAL KNEE ARTHROPLASTY Left 09/24/2018   Procedure: UNICOMPARTMENTAL KNEE;  Surgeon: Marchia Bond, MD;  Location: [facility];  Service: Orthopedics;  Laterality: Left;    ROS- all systems are reviewed and negative except as per HPI above  Current Outpatient Medications  Medication Sig Dispense Refill   albuterol (VENTOLIN HFA) 108 (90 Base) MCG/ACT inhaler Inhale 2 puffs into the lungs every 6 (six) hours as needed for wheezing or shortness of breath. 8 g 0   amLODipine (NORVASC) 10 MG tablet Take 1 tablet (10 mg total) by mouth daily.     amoxicillin-clavulanate (AUGMENTIN) 875-125 MG tablet Take 1 tablet by mouth 2 (two) times daily. 20 tablet 0   aspirin EC 81 MG tablet Take 81 mg by mouth daily.     baclofen (LIORESAL) 10 MG tablet TAKE 1 TABLET BY MOUTH 3  TIMES DAILY AS NEEDED FOR  MUSCLE SPASMS 270 tablet 0   Clobetasol Propionate 0.05 % shampoo Apply 1 application topically daily. As needed for scalp irritation from psoriasis 236 mL 3   fluticasone (FLOVENT HFA) 110 MCG/ACT inhaler Inhale 2 puffs into the lungs 2 (two) times daily as needed (for shortness of breath or wheezing). 3 each 11   gabapentin (NEURONTIN) 100 MG capsule TAKE 1 CAPSULE BY MOUTH  TWICE DAILY AS NEEDED 180 capsule 2   hydrochlorothiazide (  HYDRODIURIL) 12.5 MG tablet TAKE 1 TABLET BY MOUTH  DAILY 90 tablet 3   isosorbide mononitrate (IMDUR) 30 MG 24 hr tablet Take 1 tablet (30 mg total) by mouth daily. 90 tablet 3   levothyroxine (SYNTHROID) 112 MCG tablet Take 1 tablet (112 mcg total) by mouth daily before breakfast. 90 tablet 3   losartan (COZAAR) 100 MG tablet Take 1 tablet (100 mg total) by mouth at bedtime. 90 tablet 3   meclizine (ANTIVERT) 25 MG tablet Take 1 tablet (25 mg total) by mouth 3 (three) times daily as needed for dizziness. 30 tablet 0   meloxicam (MOBIC) 15 MG tablet Take 15 mg by mouth daily as needed for pain.     metoprolol  tartrate (LOPRESSOR) 25 MG tablet Take 1 tablet (25 mg total) by mouth 2 (two) times daily. 180 tablet 3   metoprolol tartrate (LOPRESSOR) 50 MG tablet Take 1 tablet (50 mg total) by mouth 2 (two) times daily. 180 tablet 3   mometasone (ELOCON) 0.1 % cream Apply 1 application. topically daily. To vaginal area as needed for redness/irritation. Can use twice daily for the first week 100 g 1   omeprazole (PRILOSEC) 40 MG capsule TAKE 1 CAPSULE BY MOUTH IN  THE MORNING AND MAY REPEAT  DOSE IN THE EVENING AS  NEEDED FOR ACID REFLUX /  INDIGESTION 180 capsule 3   ondansetron (ZOFRAN) 4 MG tablet Take 1 tablet (4 mg total) by mouth every 8 (eight) hours as needed for nausea or vomiting. 10 tablet 0   pravastatin (PRAVACHOL) 80 MG tablet Take 1 tablet (80 mg total) by mouth at bedtime. 90 tablet 3   sennosides-docusate sodium (SENOKOT-S) 8.6-50 MG tablet Take 2 tablets by mouth daily. 30 tablet 1   tetrahydrozoline 0.05 % ophthalmic solution Place 2 drops into both eyes daily as needed (for dry eyes).     triamcinolone (NASACORT ALLERGY 24HR) 55 MCG/ACT AERO nasal inhaler Place 2 sprays into the nose daily. 3 Inhaler 3   No current facility-administered medications for this visit.    Physical Exam: Vitals:   12/23/21 1050  BP: (!) 142/70  Pulse: 64  SpO2: 93%  Weight: 185 lb 3.2 oz (84 kg)  Height: _0  (1.651 m)    GEN- The patient is well appearing, alert and oriented x 3 today.   Head- normocephalic, atraumatic Eyes-  Sclera clear, conjunctiva pink Ears- hearing intact Oropharynx- clear Lungs- Clear to ausculation bilaterally, normal work of breathing Chest- pacemaker pocket is well healed Heart- Regular rate and rhythm, no murmurs, rubs or gallops, PMI not laterally displaced GI- soft, NT, ND, + BS Extremities- no clubbing, cyanosis, or edema  Pacemaker interrogation- reviewed in detail today,  See PACEART report  ekg tracing ordered today is personally reviewed and shows sinus  rhythm 64 bpm, PR 232 msec, incomplete rbbb, PVCs  Assessment and Plan:  1. Symptomatic second degree heart block Normal pacemaker function See Pace Art report No changes today she is not device dependant today  2. HTN She takes her BP in the AM prior to taking her am BP medicines and then after taking her BP medicines finds that her BP is "too low" several hours later.  I have advised that she not take her AM bp until she has taken her AM medicines and given them some time to become effective.  Reduce am metoprolol to 72m and continue evening dose of 752m  3. SVT Noted again on device interrogation.  Prior NSVT  and PVCs noted. Conservative management is advised given advanced age.  Appears to  likely be AVNRT.  I have offered ablation which she declines. I do not see any recent Afib  4. PVCs Followed by Dr Orvil Feil previously  Controlled with metoprolol  5. Aortic stenosis/ AI Follows with Dr Stanford Breed    Return to see EP APP in 6 months Follow with Dr Stanford Breed as scheduled  Thompson Grayer MD, [facility] 12/23/2021 11:08 AM

## 2021-12-28 ENCOUNTER — Ambulatory Visit (INDEPENDENT_AMBULATORY_CARE_PROVIDER_SITE_OTHER): Payer: Medicare Other

## 2021-12-28 DIAGNOSIS — I459 Conduction disorder, unspecified: Secondary | ICD-10-CM | POA: Diagnosis not present

## 2021-12-29 LAB — CUP PACEART REMOTE DEVICE CHECK
Battery Remaining Longevity: 161 mo
Battery Voltage: 3.04 V
Brady Statistic AP VP Percent: 0.11 %
Brady Statistic AP VS Percent: 16.09 %
Brady Statistic AS VP Percent: 0.07 %
Brady Statistic AS VS Percent: 83.73 %
Brady Statistic RA Percent Paced: 19.06 %
Brady Statistic RV Percent Paced: 0.18 %
Date Time Interrogation Session: 20230621041200
Implantable Lead Implant Date: 20210921
Implantable Lead Implant Date: 20210921
Implantable Lead Location: 753859
Implantable Lead Location: 753860
Implantable Lead Model: 3830
Implantable Lead Model: 5076
Implantable Pulse Generator Implant Date: 20210921
Lead Channel Impedance Value: 304 Ohm
Lead Channel Impedance Value: 361 Ohm
Lead Channel Impedance Value: 532 Ohm
Lead Channel Impedance Value: 532 Ohm
Lead Channel Pacing Threshold Amplitude: 0.875 V
Lead Channel Pacing Threshold Amplitude: 1 V
Lead Channel Pacing Threshold Pulse Width: 0.4 ms
Lead Channel Pacing Threshold Pulse Width: 0.4 ms
Lead Channel Sensing Intrinsic Amplitude: 12.75 mV
Lead Channel Sensing Intrinsic Amplitude: 12.75 mV
Lead Channel Sensing Intrinsic Amplitude: 3.375 mV
Lead Channel Sensing Intrinsic Amplitude: 3.375 mV
Lead Channel Setting Pacing Amplitude: 2 V
Lead Channel Setting Pacing Amplitude: 2.25 V
Lead Channel Setting Pacing Pulse Width: 0.4 ms
Lead Channel Setting Sensing Sensitivity: 0.9 mV

## 2022-01-02 ENCOUNTER — Other Ambulatory Visit: Payer: Self-pay | Admitting: Cardiology

## 2022-01-04 ENCOUNTER — Other Ambulatory Visit: Payer: Self-pay

## 2022-01-04 MED ORDER — METOPROLOL TARTRATE 50 MG PO TABS: 50.0000 mg | ORAL_TABLET | Freq: Two times a day (BID) | ORAL | 3 refills | Status: DC

## 2022-01-04 MED ORDER — METOPROLOL TARTRATE 25 MG PO TABS: 25.0000 mg | ORAL_TABLET | Freq: Every evening | ORAL | 3 refills | Status: DC

## 2022-01-05 DIAGNOSIS — R0982 Postnasal drip: Secondary | ICD-10-CM | POA: Diagnosis not present

## 2022-01-05 DIAGNOSIS — J339 Nasal polyp, unspecified: Secondary | ICD-10-CM | POA: Diagnosis not present

## 2022-01-05 DIAGNOSIS — J328 Other chronic sinusitis: Secondary | ICD-10-CM | POA: Diagnosis not present

## 2022-01-05 DIAGNOSIS — K219 Gastro-esophageal reflux disease without esophagitis: Secondary | ICD-10-CM | POA: Diagnosis not present

## 2022-01-05 DIAGNOSIS — J300 Vasomotor rhinitis: Secondary | ICD-10-CM | POA: Diagnosis not present

## 2022-01-05 DIAGNOSIS — Z79899 Other long term (current) drug therapy: Secondary | ICD-10-CM | POA: Diagnosis not present

## 2022-01-05 DIAGNOSIS — J3489 Other specified disorders of nose and nasal sinuses: Secondary | ICD-10-CM | POA: Diagnosis not present

## 2022-01-09 NOTE — Progress Notes (Signed)
Remote pacemaker transmission.   

## 2022-01-15 NOTE — Progress Notes (Unsigned)
   Established Patient Office Visit  Subjective   Patient ID: [name], female   DOB: 06-30-1935 Age: 87 y.o. MRN: [id number]   No chief complaint on file.   HPI Pleasant 87 year old female presenting today for the following:  HTN: followed by Cardiology. Recently had her dose of Metoprolol adjusted.   HLD:  Hypothyroidism:  GERD:   Objective:    There were no vitals filed for this visit.  Physical Exam   No results found for this or any previous visit (from the past 24 hour(s)).   {Labs (Optional):23779}  The ASCVD Risk score (Arnett DK, et al., 2019) failed to calculate for the following reasons:   The 2019 ASCVD risk score is only valid for ages 40 to 69   The patient has a prior MI or stroke diagnosis   Assessment & Plan:   No problem-specific Assessment & Plan notes found for this encounter.   No follow-ups on file.  ___________________________________________ Clearnce Sorrel, DNP, APRN, FNP-BC [facility] [place]

## 2022-01-16 ENCOUNTER — Encounter: Payer: Self-pay | Admitting: Medical-Surgical

## 2022-01-16 ENCOUNTER — Other Ambulatory Visit: Payer: Self-pay | Admitting: Rehabilitation

## 2022-01-16 ENCOUNTER — Ambulatory Visit (INDEPENDENT_AMBULATORY_CARE_PROVIDER_SITE_OTHER): Payer: Medicare Other | Admitting: Medical-Surgical

## 2022-01-16 VITALS — BP 141/75 | HR 73 | Resp 20 | Ht 65.0 in | Wt 189.1 lb

## 2022-01-16 DIAGNOSIS — I10 Essential (primary) hypertension: Secondary | ICD-10-CM

## 2022-01-16 DIAGNOSIS — M47816 Spondylosis without myelopathy or radiculopathy, lumbar region: Secondary | ICD-10-CM | POA: Diagnosis not present

## 2022-01-16 DIAGNOSIS — E039 Hypothyroidism, unspecified: Secondary | ICD-10-CM | POA: Diagnosis not present

## 2022-01-16 DIAGNOSIS — M48062 Spinal stenosis, lumbar region with neurogenic claudication: Secondary | ICD-10-CM

## 2022-01-16 DIAGNOSIS — E559 Vitamin D deficiency, unspecified: Secondary | ICD-10-CM

## 2022-01-16 DIAGNOSIS — R350 Frequency of micturition: Secondary | ICD-10-CM

## 2022-01-16 DIAGNOSIS — K219 Gastro-esophageal reflux disease without esophagitis: Secondary | ICD-10-CM

## 2022-01-16 DIAGNOSIS — E7849 Other hyperlipidemia: Secondary | ICD-10-CM

## 2022-01-16 LAB — POCT URINALYSIS DIP (CLINITEK)
Bilirubin, UA: NEGATIVE
Blood, UA: NEGATIVE
Glucose, UA: NEGATIVE mg/dL
Ketones, POC UA: NEGATIVE mg/dL
Leukocytes, UA: NEGATIVE
Nitrite, UA: NEGATIVE
POC PROTEIN,UA: NEGATIVE
Spec Grav, UA: 1.02 (ref 1.010–1.025)
Urobilinogen, UA: 0.2 E.U./dL
pH, UA: 5.5 (ref 5.0–8.0)

## 2022-01-16 MED ORDER — PREMARIN 0.625 MG/GM VA CREA: 1.0000 | TOPICAL_CREAM | Freq: Every day | VAGINAL | 12 refills | Status: DC

## 2022-01-17 LAB — LIPID PANEL
Cholesterol: 145 mg/dL (ref <200)
HDL: 47 mg/dL — ABNORMAL LOW (ref >=50)
LDL Cholesterol (Calc): 73 mg/dL (calc)
Non-HDL Cholesterol (Calc): 98 mg/dL (calc) (ref <130)
Total CHOL/HDL Ratio: 3.1 (calc) (ref <5.0)
Triglycerides: 171 mg/dL — ABNORMAL HIGH (ref <150)

## 2022-01-17 LAB — TSH: TSH: 1.18 mIU/L (ref 0.40–4.50)

## 2022-01-17 LAB — VITAMIN D 25 HYDROXY (VIT D DEFICIENCY, FRACTURES): Vit D, 25-Hydroxy: 34 ng/mL (ref 30–100)

## 2022-01-18 ENCOUNTER — Other Ambulatory Visit (HOSPITAL_COMMUNITY): Payer: Self-pay | Admitting: Rehabilitation

## 2022-01-18 DIAGNOSIS — M47816 Spondylosis without myelopathy or radiculopathy, lumbar region: Secondary | ICD-10-CM

## 2022-01-18 DIAGNOSIS — M48062 Spinal stenosis, lumbar region with neurogenic claudication: Secondary | ICD-10-CM

## 2022-01-31 NOTE — Progress Notes (Unsigned)
Cardiology Clinic Note   Patient Name: [name] Date of Encounter: 02/02/2022  Primary Care Provider:  Samuel Bouche, NP Primary Cardiologist:  Kirk Ruths, MD  Patient Profile    [name] 87 year old female presents the clinic today for follow-up evaluation of her paroxysmal atrial fibrillation and hypertension.  Past Medical History    Past Medical History:  Diagnosis Date   Arthritis    Asthma    Atrial fibrillation (HCC)    paroxysmal , addressed  with metoprolol    Chronic sinusitis    Complication of anesthesia    Dyspnea    sob on exertion, reports at her pre-op today 09-13-2018 that her cardiologist has planned for her to do a ECHO for evaluation    Family history of adverse reaction to anesthesia    sister is hard to wake up from Anesthesia   Follicular lymphoma ([place])    received Chemotherapy (2 years ago)  and radiation (last 12/2017)   Hyperlipidemia    Hypertension    Hypothyroidism    Meniere's disease    PONV (postoperative nausea and vomiting)    Primary localized osteoarthritis of left knee 09/24/2018   Primary localized osteoarthritis of right knee 06/18/2018   Reflux    Past Surgical History:  Procedure Laterality Date   ABDOMINAL HYSTERECTOMY     BONE MARROW BIOPSY  06/16/2014   at [place]     bilateral   CHOLECYSTECTOMY     KNEE ARTHROSCOPY     PACEMAKER IMPLANT N/A 03/30/2020   Procedure: PACEMAKER IMPLANT;  Surgeon: Thompson Grayer, MD;  Location: [facility];  Service: Cardiovascular;  Laterality: N/A;   PARTIAL KNEE ARTHROPLASTY Right 06/18/2018   Procedure: UNICOMPARTMENTAL KNEE;  Surgeon: Marchia Bond, MD;  Location: [facility];  Service: Orthopedics;  Laterality: Right;   PARTIAL KNEE ARTHROPLASTY Left 09/24/2018   Procedure: UNICOMPARTMENTAL KNEE;  Surgeon: Marchia Bond, MD;  Location: [facility];  Service: Orthopedics;  Laterality: Left;    Allergies  No Known Allergies  History of Present  Illness    [name] has a PMH of HTN, paroxysmal atrial fibrillation, SVT, asthma, GERD, hypothyroidism carpal tunnel syndrome, degenerative disc disease, osteopenia, obesity, fatigue, left Uni knee replacement, syncope, and PPM (03/30/2020).  Carotid Dopplers 9/21 showed a 1-39% bilateral stenosis, nuclear stress test 8/22 showed an EF of 40% and normal perfusion.  Echocardiogram 8/22 showed normal LV function, mild LV hypertrophy, G1 DD, mild-moderate aortic insufficiency, mild aortic stenosis with a mean gradient of 16 mmHg.  She was noted to have AVNRT on remote device check and her metoprolol was increased.  She was last seen by Dr. Stanford Breed on 07/28/2021.  During that time she denied dyspnea and syncope.  She did note occasional episodes of chest discomfort.  She described the discomfort as sternal pain radiating to her neck.  The discomfort occurred with activities and at rest.  It would last approximately 10 minutes and resolve.  She did note some increase in the discomfort with inspiration.  She was noted to have increased blood pressure and her amlodipine was uptitrated to 10 mg daily.  She was seen in follow-up by Dr. Rayann Heman on 12/23/2021.  During that time she presented for routine follow-up.  She continued to do well from a cardiac standpoint and remain physically active.  She did note rare abrupt onset of tachypalpitations that were associated with chest discomfort due to SVT.  She continued to  have labile blood pressure.  She denied chest pain and shortness of breath.  She denied lower extremity swelling dizziness, presyncope and syncope.  PPM interrogated and showed normal device function battery status and stable leads with appropriate histograms.  She presents to the clinic today for follow-up evaluation and states she has had 2-3 episodes over the last 2 to 3 weeks of neck and throat pressure.  She does not notice any pattern with these episodes and has not checked her blood pressure  during those times.  She does report that in the evenings she has noticed that her blood pressure is elevated in the 180s over 80s.  She reports compliance with her GERD medication.  She does note that she drinks 2 cups of coffee each morning and has the occasional Diet Coke.  She also uses salt in her cooking.  We reviewed the importance of avoidance of causes for secondary hypertension.  Her daughter expressed understanding.  I have asked them to reach out to EP for remote download over the last 2 to 3 weeks.  I will stop her losartan and start valsartan.  We will have her draw a BMP in 1 week, repeat echocardiogram, and plan follow-up in 2 to 3 months.  I have also asked her to maintain a blood pressure log.  Today she denies chest pain, shortness of breath, lower extremity edema, fatigue, palpitations, melena, hematuria, hemoptysis, diaphoresis, weakness, presyncope, syncope, orthopnea, and PND.      Home Medications    Prior to Admission medications   Medication Sig Start Date End Date Taking? Authorizing Provider  albuterol (VENTOLIN HFA) 108 (90 Base) MCG/ACT inhaler Inhale 2 puffs into the lungs every 6 (six) hours as needed for wheezing or shortness of breath. 11/23/20   Brunetta Jeans, PA-C  amLODipine (NORVASC) 10 MG tablet Take 1 tablet (10 mg total) by mouth daily. 11/25/21   Lelon Perla, MD  aspirin EC 81 MG tablet Take 81 mg by mouth daily.    [provider]  baclofen (LIORESAL) 10 MG tablet TAKE 1 TABLET BY MOUTH 3  TIMES DAILY AS NEEDED FOR  MUSCLE SPASMS 02/08/21   [name], DO  Clobetasol Propionate 0.05 % shampoo Apply 1 application topically daily. As needed for scalp irritation from psoriasis 03/23/21   [name], DO  conjugated estrogens (PREMARIN) vaginal cream Place 1 Applicatorful vaginally daily. 01/16/22   Samuel Bouche, NP  fluticasone (FLOVENT HFA) 110 MCG/ACT inhaler Inhale 2 puffs into the lungs 2 (two) times daily as needed (for  shortness of breath or wheezing). 03/23/21   [name], DO  gabapentin (NEURONTIN) 100 MG capsule TAKE 1 CAPSULE BY MOUTH  TWICE DAILY AS NEEDED 06/28/20   [name], DO  hydrochlorothiazide (HYDRODIURIL) 12.5 MG tablet TAKE 1 TABLET BY MOUTH  DAILY 10/27/21   Lelon Perla, MD  isosorbide mononitrate (IMDUR) 30 MG 24 hr tablet TAKE 1 TABLET BY MOUTH  DAILY 01/03/22   Lelon Perla, MD  levothyroxine (SYNTHROID) 112 MCG tablet TAKE 1 TABLET BY MOUTH  DAILY BEFORE BREAKFAST 01/03/22   Lelon Perla, MD  losartan (COZAAR) 100 MG tablet Take 1 tablet (100 mg total) by mouth at bedtime. 01/21/21   Lelon Perla, MD  meclizine (ANTIVERT) 25 MG tablet Take 1 tablet (25 mg total) by mouth 3 (three) times daily as needed for dizziness. 10/10/18   [name], DO  meloxicam (MOBIC) 15 MG tablet Take 15 mg by mouth daily as needed  for pain.    [provider]  metoprolol tartrate (LOPRESSOR) 25 MG tablet Take 1 tablet (25 mg total) by mouth every evening. 01/04/22   Allred, Jeneen Rinks, MD  metoprolol tartrate (LOPRESSOR) 50 MG tablet Take 1 tablet (50 mg total) by mouth 2 (two) times daily. Pt takes metoprolol 50 mg in AM and 75 mg in evening. 01/04/22   Allred, Jeneen Rinks, MD  mometasone (ELOCON) 0.1 % cream Apply 1 application. topically daily. To vaginal area as needed for redness/irritation. Can use twice daily for the first week 09/22/21   Samuel Bouche, NP  omeprazole (PRILOSEC) 40 MG capsule TAKE 1 CAPSULE BY MOUTH IN  THE MORNING AND MAY REPEAT  DOSE IN THE EVENING AS  NEEDED FOR ACID REFLUX /  INDIGESTION 03/02/20   [name], DO  ondansetron (ZOFRAN) 4 MG tablet Take 1 tablet (4 mg total) by mouth every 8 (eight) hours as needed for nausea or vomiting. 09/03/18   [name], DO  pravastatin (PRAVACHOL) 80 MG tablet TAKE 1 TABLET BY MOUTH AT  BEDTIME 01/03/22   Lelon Perla, MD  sennosides-docusate sodium (SENOKOT-S) 8.6-50 MG tablet Take 2 tablets by mouth  daily. 06/18/18   Marchia Bond, MD  tetrahydrozoline 0.05 % ophthalmic solution Place 2 drops into both eyes daily as needed (for dry eyes).    [provider]  triamcinolone (NASACORT ALLERGY 24HR) 55 MCG/ACT AERO nasal inhaler Place 2 sprays into the nose daily. 09/03/18   [name], DO    Family History    Family History  Problem Relation Age of Onset   Heart disease Other    Skin cancer Mother    Stroke Mother    Prostate cancer Father    High blood pressure Father    Heart attack Father    She indicated that the status of her mother is unknown. She indicated that the status of her father is unknown. She indicated that the status of her other is unknown.  Social History    Social History   Socioeconomic History   Marital status: Widowed    Spouse name: Not on file   Number of children: 1   Years of education: 66   Highest education level: 12th grade  Occupational History   Occupation: Theatre manager    Comment: retired  Tobacco Use   Smoking status: Never   Smokeless tobacco: Never  Vaping Use   Vaping Use: Never used  Substance and Sexual Activity   Alcohol use: No   Drug use: No   Sexual activity: Not Currently    Partners: Male  Other Topics Concern   Not on file  Social History Narrative   No exercise. 2 cups of coffee a day    Social Determinants of Health   Financial Resource Strain: Low Risk  (03/10/2019)   Overall Financial Resource Strain (CARDIA)    Difficulty of Paying Living Expenses: Not hard at all  Food Insecurity: No Food Insecurity (03/10/2019)   Hunger Vital Sign    Worried About Running Out of Food in the Last Year: Never true    Ran Out of Food in the Last Year: Never true  Transportation Needs: No Transportation Needs (03/10/2019)   PRAPARE - Hydrologist (Medical): No    Lack of Transportation (Non-Medical): No  Physical Activity: Inactive (03/10/2019)   Exercise Vital Sign    Days of  Exercise per Week: 0 days    Minutes of Exercise per Session:  0 min  Stress: No Stress Concern Present (03/10/2019)   [place]    Feeling of Stress : Not at all  Social Connections: Socially Isolated (03/10/2019)   Social Connection and Isolation Panel [NHANES]    Frequency of Communication with Friends and Family: Once a week    Frequency of Social Gatherings with Friends and Family: Once a week    Attends Religious Services: 1 to 4 times per year    Active Member of [employer] or Organizations: No    Attends Archivist Meetings: Never    Marital Status: Widowed  Intimate Partner Violence: Not At Risk (03/10/2019)   Humiliation, Afraid, Rape, and Kick questionnaire    Fear of Current or Ex-Partner: No    Emotionally Abused: No    Physically Abused: No    Sexually Abused: No     Review of Systems    General:  No chills, fever, night sweats or weight changes.  Cardiovascular:  No chest pain, dyspnea on exertion, edema, orthopnea, palpitations, paroxysmal nocturnal dyspnea. Dermatological: No rash, lesions/masses Respiratory: No cough, dyspnea Urologic: No hematuria, dysuria Abdominal:   No nausea, vomiting, diarrhea, bright red blood per rectum, melena, or hematemesis Neurologic:  No visual changes, wkns, changes in mental status. All other systems reviewed and are otherwise negative except as noted above.  Physical Exam    VS:  BP 134/72   Pulse 71   Ht _0  (1.651 m)   Wt 188 lb 12.8 oz (85.6 kg)   SpO2 96%   BMI 31.42 kg/m  , BMI Body mass index is 31.42 kg/m. GEN: Well nourished, well developed, in no acute distress. HEENT: normal. Neck: Supple, no JVD, carotid bruits, or masses. Cardiac: RRR, no murmurs, rubs, or gallops. No clubbing, cyanosis, edema.  Radials/DP/PT 2+ and equal bilaterally.  Respiratory:  Respirations regular and unlabored, clear to auscultation bilaterally. GI: Soft, nontender,  nondistended, BS + x 4. MS: no deformity or atrophy. Skin: warm and dry, no rash. Neuro:  Strength and sensation are intact. Psych: Normal affect.  Accessory Clinical Findings    Recent Labs: 01/16/2022: TSH 1.18   Recent Lipid Panel    Component Value Date/Time   CHOL 145 01/16/2022 0000   TRIG 171 (H) 01/16/2022 0000   HDL 47 (L) 01/16/2022 0000   CHOLHDL 3.1 01/16/2022 0000   LDLCALC 73 01/16/2022 0000    ECG personally reviewed by me today-none today.  Echocardiogram 03/09/2021  IMPRESSIONS     1. Left ventricular ejection fraction, by estimation, is 60 to 65%. Left  ventricular ejection fraction by PLAX is 65 %. The left ventricle has  normal function. The left ventricle has no regional wall motion  abnormalities. There is mild concentric left  ventricular hypertrophy. Left ventricular diastolic parameters are  consistent with Grade I diastolic dysfunction (impaired relaxation).  Elevated left ventricular end-diastolic pressure.   2. Right ventricular systolic function is normal. The right ventricular  size is normal.   3. The mitral valve is grossly normal. Trivial mitral valve  regurgitation. No evidence of mitral stenosis.   4. The aortic valve is tricuspid. There is mild calcification of the  aortic valve. There is mild thickening of the aortic valve. Aortic valve  regurgitation is mild to moderate. Mild aortic valve stenosis. Aortic  regurgitation PHT measures 344 msec.  Aortic valve area, by VTI measures 1.28 cm. Aortic valve mean gradient  measures 16.0 mmHg. Aortic valve Vmax measures  2.64 m/s.   5. The inferior vena cava is normal in size with greater than 50%  respiratory variability, suggesting right atrial pressure of 3 mmHg.   Assessment & Plan   1.  Aortic stenosis-denies increased activity tolerance and DOE.  Echocardiogram 8/22 showed mild aortic stenosis with mean gradient of 16 mmHg. Repeat echocardiogram  PPM-had syncopal episode 9/21.  Was  found to be bradycardic and underwent PPM.  Recommended that she contact EP for remote download of the last 2-3 weeks to see if there is correlation between symptoms of pressure in her neck and throat. Follows with EP  Essential hypertension-BP today 134/72.  Has noticed increased blood pressures in the evenings up to 180's over 80s. Continue amlodipine, hydrochlorothiazide,  metoprolol Switch from caffeinated to decaf coffee Start valsartan 80 mg daily Stop losartan Heart healthy low-sodium diet-salty 6 given Increase physical activity as tolerated Maintain blood pressure log BMP in 1 week  Paroxysmal atrial fibrillation-heart rate today 71 bpm.  Previously identified during device check.   Metoprolol Avoid triggers caffeine, chocolate, EtOH, dehydration etc. Heart healthy low-sodium diet Increase physical activity as tolerated  Precordial pain-denies recent episodes of chest discomfort. Continue to monitor  Disposition: Follow-up with Dr. Stanford Breed or me in 6-9 months.   Jossie Ng. Rasul Decola NP-C     02/02/2022, 12:25 PM [place] [address] Office [phone] Fax [phone]  Notice: This dictation was prepared with Dragon dictation along with smaller phrase technology. Any transcriptional errors that result from this process are unintentional and may not be corrected upon review.  I spent 15 minutes examining this patient, reviewing medications, and using patient centered shared decision making involving her cardiac care.  Prior to her visit I spent greater than 20 minutes reviewing her past medical history,  medications, and prior cardiac tests.

## 2022-02-02 ENCOUNTER — Telehealth: Payer: Self-pay

## 2022-02-02 ENCOUNTER — Ambulatory Visit (INDEPENDENT_AMBULATORY_CARE_PROVIDER_SITE_OTHER): Payer: Medicare Other | Admitting: General Practice

## 2022-02-02 ENCOUNTER — Encounter: Payer: Self-pay | Admitting: General Practice

## 2022-02-02 ENCOUNTER — Telehealth: Payer: Self-pay | Admitting: Cardiology

## 2022-02-02 VITALS — BP 134/72 | HR 71 | Ht 65.0 in | Wt 188.8 lb

## 2022-02-02 DIAGNOSIS — I480 Paroxysmal atrial fibrillation: Secondary | ICD-10-CM

## 2022-02-02 DIAGNOSIS — Z950 Presence of cardiac pacemaker: Secondary | ICD-10-CM | POA: Diagnosis not present

## 2022-02-02 DIAGNOSIS — R072 Precordial pain: Secondary | ICD-10-CM | POA: Diagnosis not present

## 2022-02-02 DIAGNOSIS — I350 Nonrheumatic aortic (valve) stenosis: Secondary | ICD-10-CM | POA: Diagnosis not present

## 2022-02-02 DIAGNOSIS — I10 Essential (primary) hypertension: Secondary | ICD-10-CM

## 2022-02-02 MED ORDER — VALSARTAN 80 MG PO TABS: 80.0000 mg | ORAL_TABLET | Freq: Every day | ORAL | 0 refills | Status: DC

## 2022-02-02 MED ORDER — VALSARTAN 80 MG PO TABS: 80.0000 mg | ORAL_TABLET | Freq: Every day | ORAL | 6 refills | Status: DC

## 2022-02-02 NOTE — Telephone Encounter (Signed)
-----   Message from Waylan Rocher, LPN sent at 0/76/2263  3:32 PM EDT ----- Regarding: DOWNLOADS Hello- Denyse Amass would like pt to do a pacemaker download tonight.  Thank you!

## 2022-02-02 NOTE — Telephone Encounter (Signed)
Successful telephone encounter to patient to make sure she is aware to send manual transmission this even and to assess for her understanding of how to send transmission. Patient states she has sent manual remote transmission in the past and comfortable with the procedure. Will review transmission once it arrives for episodes that may correlate with symptoms of pressure in the throat.

## 2022-02-02 NOTE — Patient Instructions (Addendum)
Medication Instructions:  STOP LOSARTAN  START VALSARTAN '80MG'$  DAILY  *If you need a refill on your cardiac medications before your next appointment, please call your pharmacy*  Lab Work:   Testing/Procedures:  BMET IN 1 WEEK  ECHO AT CHECKOUT  If you have labs (blood work) drawn today and your tests are completely normal, you will receive your results only by: [place] (if you have MyChart) OR  A paper copy in the mail If you have any lab test that is abnormal or we need to change your treatment, we will call you to review the results.  You may also go to any of these [facility] locations:  [place] [id number],  [place] [address] ([facility]) [address]  [place] [address] [facility])  Special Instructions PLEASE READ AND FOLLOW SALTY 6-ATTACHED-1,'800mg'$  daily  STOP USING SALT  SWITCH TO DECAFFEINATED DRINKS  TAKE AND LOG YOUR BLOOD PRESSURE: SIT STILL WHEN TAKING AND WAIT UNTIL AT LEAST 1 HOUR AFTER MEDICATION   IF YOUR "FEEL BLOOD PRESSURE IN THROAT" TAKE AND LOG YOUR BLOOD PRESSURE  REQUEST PACEMAKER DOWNLOAD FOR THE LAST 2 WEEKS  Follow-Up: Your next appointment:  2-3 month(s) In Person with Kirk Ruths, MD  or Coletta Memos, FNP       At [facility], you and your health needs are our priority.  As part of our continuing mission to provide you with exceptional heart care, we have created designated Provider Care Teams.  These Care Teams include your primary Cardiologist (physician) and Advanced Practice Providers (APPs -  Physician Assistants and Nurse Practitioners) who all work together to provide you with the care you need, when you need it.  Important Information About Sugar              6 SALTY THINGS TO AVOID     1,'800MG'$  DAILY

## 2022-02-02 NOTE — Telephone Encounter (Signed)
Pt c/o medication issue:  1. Name of Medication:   valsartan (DIOVAN) 80 MG tablet  2. How are you currently taking this medication (dosage and times per day)?   3. Are you having a reaction (difficulty breathing--STAT)?   4. What is your medication issue?   Patient called stating she was to start taking this medication but the prescription was send to  [facility] ([facility] - [place], [place]. and she needs this prescription sent [id number] [place].  Patient would like a call back to confirm.

## 2022-02-02 NOTE — Telephone Encounter (Signed)
Spoke to patient she stated she was told to start Valsartan 80 mg today.Stated prescription was sent to mail order pharmacy.She also needs [facility].Advised I will [facility] to [facility].

## 2022-02-03 ENCOUNTER — Ambulatory Visit: Payer: Medicare Other | Admitting: Cardiology

## 2022-02-08 ENCOUNTER — Telehealth: Payer: Self-pay

## 2022-02-08 NOTE — Telephone Encounter (Signed)
Patient called in wanting someone to go over her latest transmission with her. I let patient know a nurse will give her a call back

## 2022-02-08 NOTE — Telephone Encounter (Signed)
Spoke with patient, patient was following up regarding phone note/ transmission on 02/02/2022 a transmission was never received, walked patient through transmission, only episode that occurred was on 01/28/22 at 2028 AVNRT [id number] in duration average A/V 158/158bpm per patient this date and time does not correlate with symptoms

## 2022-02-22 ENCOUNTER — Ambulatory Visit (HOSPITAL_COMMUNITY): Payer: Medicare Other | Attending: Cardiology

## 2022-02-22 DIAGNOSIS — R072 Precordial pain: Secondary | ICD-10-CM | POA: Diagnosis not present

## 2022-02-22 DIAGNOSIS — I480 Paroxysmal atrial fibrillation: Secondary | ICD-10-CM | POA: Diagnosis not present

## 2022-02-22 DIAGNOSIS — I10 Essential (primary) hypertension: Secondary | ICD-10-CM | POA: Diagnosis not present

## 2022-02-22 DIAGNOSIS — I350 Nonrheumatic aortic (valve) stenosis: Secondary | ICD-10-CM

## 2022-02-22 DIAGNOSIS — Z950 Presence of cardiac pacemaker: Secondary | ICD-10-CM | POA: Diagnosis not present

## 2022-02-22 LAB — ECHOCARDIOGRAM COMPLETE
AR max vel: 1.1 cm2
AV Area VTI: 1.06 cm2
AV Area mean vel: 0.95 cm2
AV Mean grad: 21 mmHg
AV Peak grad: 33.2 mmHg
Ao pk vel: 2.88 m/s
Area-P 1/2: 2.55 cm2
P 1/2 time: 383 msec
S' Lateral: 2.9 cm

## 2022-02-23 LAB — BASIC METABOLIC PANEL
BUN/Creatinine Ratio: 19 (ref 12–28)
BUN: 19 mg/dL (ref 8–27)
CO2: 25 mmol/L (ref 20–29)
Calcium: 9.3 mg/dL (ref 8.7–10.3)
Chloride: 98 mmol/L (ref 96–106)
Creatinine, Ser: 1.02 mg/dL — ABNORMAL HIGH (ref 0.57–1.00)
Glucose: 107 mg/dL — ABNORMAL HIGH (ref 70–99)
Potassium: 4.2 mmol/L (ref 3.5–5.2)
Sodium: 138 mmol/L (ref 134–144)
eGFR: 54 mL/min/{1.73_m2} — ABNORMAL LOW (ref >59)

## 2022-03-01 ENCOUNTER — Encounter: Payer: Self-pay | Admitting: General Practice

## 2022-03-03 DIAGNOSIS — M25511 Pain in right shoulder: Secondary | ICD-10-CM | POA: Diagnosis not present

## 2022-03-03 DIAGNOSIS — M542 Cervicalgia: Secondary | ICD-10-CM | POA: Diagnosis not present

## 2022-03-03 DIAGNOSIS — M25512 Pain in left shoulder: Secondary | ICD-10-CM | POA: Diagnosis not present

## 2022-03-07 ENCOUNTER — Telehealth: Payer: Self-pay | Admitting: General Practice

## 2022-03-07 NOTE — Telephone Encounter (Signed)
Patient would like for Coletta Memos or nurse to give her a call back. She has some questions she wanted to ask

## 2022-03-07 NOTE — Telephone Encounter (Signed)
-  Pt called to report she's been experiencing a fluctuation in her HR ranging from 40's-160's. -Pt report when HR is elevated, she feels chest pressure and overall drained. -She report yesterday was a bad day for her and HR got up to 160. -She is unable to determine precipitating or alleviating factors and report she doesn't drink caffeine but does note she doesn't drink as much water as she should.   BP  8/27-145/65 HR 45 (10 am), 118/89 151 (1pm) 156/68 HR 46 (8:17 pm) 8/28-131/99 HR 160 (3:26 pm), 135/107 HR 172 (5:24pm), 157/73 HR 47 (6:28 pm)  Will forward to NP to make aware.

## 2022-03-10 DIAGNOSIS — D3131 Benign neoplasm of right choroid: Secondary | ICD-10-CM | POA: Diagnosis not present

## 2022-03-10 DIAGNOSIS — H04123 Dry eye syndrome of bilateral lacrimal glands: Secondary | ICD-10-CM | POA: Diagnosis not present

## 2022-03-10 DIAGNOSIS — Z961 Presence of intraocular lens: Secondary | ICD-10-CM | POA: Diagnosis not present

## 2022-03-10 DIAGNOSIS — H353132 Nonexudative age-related macular degeneration, bilateral, intermediate dry stage: Secondary | ICD-10-CM | POA: Diagnosis not present

## 2022-03-14 ENCOUNTER — Ambulatory Visit (HOSPITAL_COMMUNITY)
Admission: RE | Admit: 2022-03-14 | Discharge: 2022-03-14 | Disposition: A | Discharge status: Home / Self Care | Payer: Medicare Other | Source: Ambulatory Visit | Attending: Rehabilitation | Admitting: Rehabilitation

## 2022-03-14 DIAGNOSIS — M48062 Spinal stenosis, lumbar region with neurogenic claudication: Secondary | ICD-10-CM | POA: Diagnosis not present

## 2022-03-14 DIAGNOSIS — M4316 Spondylolisthesis, lumbar region: Secondary | ICD-10-CM | POA: Diagnosis not present

## 2022-03-14 DIAGNOSIS — M47816 Spondylosis without myelopathy or radiculopathy, lumbar region: Secondary | ICD-10-CM | POA: Insufficient documentation

## 2022-03-14 NOTE — Progress Notes (Unsigned)
Cardiology Clinic Note   Patient Name: [name] Date of Encounter: 03/15/2022  Primary Care Provider:  Samuel Bouche, NP Primary Cardiologist:  Kirk Ruths, MD  Patient Profile    [name] 87 year old female presents the clinic today for follow-up evaluation of her paroxysmal atrial fibrillation and hypertension.  Past Medical History    Past Medical History:  Diagnosis Date   Arthritis    Asthma    Atrial fibrillation (HCC)    paroxysmal , addressed  with metoprolol    Chronic sinusitis    Complication of anesthesia    Dyspnea    sob on exertion, reports at her pre-op today 09-13-2018 that her cardiologist has planned for her to do a ECHO for evaluation    Family history of adverse reaction to anesthesia    sister is hard to wake up from Anesthesia   Follicular lymphoma ([place])    received Chemotherapy (2 years ago)  and radiation (last 12/2017)   Hyperlipidemia    Hypertension    Hypothyroidism    Meniere's disease    PONV (postoperative nausea and vomiting)    Primary localized osteoarthritis of left knee 09/24/2018   Primary localized osteoarthritis of right knee 06/18/2018   Reflux    Past Surgical History:  Procedure Laterality Date   ABDOMINAL HYSTERECTOMY     BONE MARROW BIOPSY  06/16/2014   at [facility]     bilateral   CHOLECYSTECTOMY     KNEE ARTHROSCOPY     PACEMAKER IMPLANT N/A 03/30/2020   Procedure: PACEMAKER IMPLANT;  Surgeon: Thompson Grayer, MD;  Location: [facility];  Service: Cardiovascular;  Laterality: N/A;   PARTIAL KNEE ARTHROPLASTY Right 06/18/2018   Procedure: UNICOMPARTMENTAL KNEE;  Surgeon: Marchia Bond, MD;  Location: [facility];  Service: Orthopedics;  Laterality: Right;   PARTIAL KNEE ARTHROPLASTY Left 09/24/2018   Procedure: UNICOMPARTMENTAL KNEE;  Surgeon: Marchia Bond, MD;  Location: [facility];  Service: Orthopedics;  Laterality: Left;    Allergies  No Known Allergies  History of Present  Illness    ZOIEY Greer has a PMH of HTN, paroxysmal atrial fibrillation, SVT, asthma, GERD, hypothyroidism carpal tunnel syndrome, degenerative disc disease, osteopenia, obesity, fatigue, left Uni knee replacement, syncope, and PPM (03/30/2020).  Carotid Dopplers 9/21 showed a 1-39% bilateral stenosis, nuclear stress test 8/22 showed an EF of 40% and normal perfusion.  Echocardiogram 8/22 showed normal LV function, mild LV hypertrophy, G1 DD, mild-moderate aortic insufficiency, mild aortic stenosis with a mean gradient of 16 mmHg.  She was noted to have AVNRT on remote device check and her metoprolol was increased.  She was last seen by Dr. Stanford Breed on 07/28/2021.  During that time she denied dyspnea and syncope.  She did note occasional episodes of chest discomfort.  She described the discomfort as sternal pain radiating to her neck.  The discomfort occurred with activities and at rest.  It would last approximately 10 minutes and resolve.  She did note some increase in the discomfort with inspiration.  She was noted to have increased blood pressure and her amlodipine was uptitrated to 10 mg daily.  She was seen in follow-up by Dr. Rayann Heman on 12/23/2021.  During that time she presented for routine follow-up.  She continued to do well from a cardiac standpoint and remain physically active.  She did note rare abrupt onset of tachypalpitations that were associated with chest discomfort due to SVT.  She continued to  have labile blood pressure.  She denied chest pain and shortness of breath.  She denied lower extremity swelling dizziness, presyncope and syncope.  PPM interrogated and showed normal device function battery status and stable leads with appropriate histograms.  She presented to the clinic 02/02/22 for follow-up evaluation and stated she  had 2-3 episodes over the last 2 to 3 weeks of neck and throat pressure.  She did not notice any pattern with these episodes and had not checked her blood pressure  during those times.  She did report that in the evenings she had noticed that her blood pressure was elevated in the 180s over 80s.  She reported compliance with her GERD medication.  She did note that she drank 2 cups of coffee each morning and had the occasional Diet Coke.  She also used salt in her cooking.  We reviewed the importance of avoidance of causes for secondary hypertension.  Her daughter expressed understanding.  I have asked them to reach out to EP for remote download over the last 2 to 3 weeks.  I  stopped her losartan and started valsartan.  I ordered a BMP in 1 week, repeat echocardiogram, and planned follow-up in 2 to 3 months.  I  asked her to maintain a blood pressure log.  Her echocardiogram 02/22/2022 showed normal LVEF, G1 DD trivial mitral valve regurgitation, severe calcification of the aortic valve with moderate aortic valve stenosis.  Follow-up BMP 02/22/2022 showed stable electrolyte and renal function.  She contacted the nurse triage line on 03/07/2022.  She reported blood pressures that were in the 157- 118/60-99 range, HR 45-172.  She was instructed to increase p.o. hydration and contact EP for remote download and schedule follow-up.  She presents to the clinic today for follow-up evaluation and states she has been feeling episodes of breathlessness and fatigue.  We reviewed her blood pressures which continue to be labile.  She notices that when she has irregular/fast heartbeats her blood pressure is higher.  She is having some back pain and has been seen by Dr. Patrice Paradise.  She is not sure at this time whether she will proceed with surgery.  We reviewed her previous visit with Dr. Rayann Heman who discussed ablation option to help better control her NSVT and PVCs.  I recommended that she return to Dr. Rayann Heman for further recommendations.  I will continue her current medication regimen, have her increase her physical activity as tolerated, continue to avoid increased caffeine and plan follow-up  in 6 months.  Today she denies chest pain, increased shortness of breath, lower extremity edema, fatigue, palpitations, melena, hematuria, hemoptysis, diaphoresis, weakness, presyncope, syncope, orthopnea, and PND.    Home Medications    Prior to Admission medications   Medication Sig Start Date End Date Taking? Authorizing Provider  albuterol (VENTOLIN HFA) 108 (90 Base) MCG/ACT inhaler Inhale 2 puffs into the lungs every 6 (six) hours as needed for wheezing or shortness of breath. 11/23/20   Brunetta Jeans, PA-C  amLODipine (NORVASC) 10 MG tablet Take 1 tablet (10 mg total) by mouth daily. 11/25/21   Lelon Perla, MD  aspirin EC 81 MG tablet Take 81 mg by mouth daily.    [provider]  baclofen (LIORESAL) 10 MG tablet TAKE 1 TABLET BY MOUTH 3  TIMES DAILY AS NEEDED FOR  MUSCLE SPASMS 02/08/21   [name], DO  Clobetasol Propionate 0.05 % shampoo Apply 1 application topically daily. As needed for scalp irritation from psoriasis 03/23/21  [name], DO  conjugated estrogens (PREMARIN) vaginal cream Place 1 Applicatorful vaginally daily. 01/16/22   Samuel Bouche, NP  fluticasone (FLOVENT HFA) 110 MCG/ACT inhaler Inhale 2 puffs into the lungs 2 (two) times daily as needed (for shortness of breath or wheezing). 03/23/21   [name], DO  gabapentin (NEURONTIN) 100 MG capsule TAKE 1 CAPSULE BY MOUTH  TWICE DAILY AS NEEDED 06/28/20   [name], DO  hydrochlorothiazide (HYDRODIURIL) 12.5 MG tablet TAKE 1 TABLET BY MOUTH  DAILY 10/27/21   Lelon Perla, MD  isosorbide mononitrate (IMDUR) 30 MG 24 hr tablet TAKE 1 TABLET BY MOUTH  DAILY 01/03/22   Lelon Perla, MD  levothyroxine (SYNTHROID) 112 MCG tablet TAKE 1 TABLET BY MOUTH  DAILY BEFORE BREAKFAST 01/03/22   Lelon Perla, MD  losartan (COZAAR) 100 MG tablet Take 1 tablet (100 mg total) by mouth at bedtime. 01/21/21   Lelon Perla, MD  meclizine (ANTIVERT) 25 MG tablet Take 1 tablet (25 mg  total) by mouth 3 (three) times daily as needed for dizziness. 10/10/18   [name], DO  meloxicam (MOBIC) 15 MG tablet Take 15 mg by mouth daily as needed for pain.    [provider]  metoprolol tartrate (LOPRESSOR) 25 MG tablet Take 1 tablet (25 mg total) by mouth every evening. 01/04/22   Allred, Jeneen Rinks, MD  metoprolol tartrate (LOPRESSOR) 50 MG tablet Take 1 tablet (50 mg total) by mouth 2 (two) times daily. Pt takes metoprolol 50 mg in AM and 75 mg in evening. 01/04/22   Allred, Jeneen Rinks, MD  mometasone (ELOCON) 0.1 % cream Apply 1 application. topically daily. To vaginal area as needed for redness/irritation. Can use twice daily for the first week 09/22/21   Samuel Bouche, NP  omeprazole (PRILOSEC) 40 MG capsule TAKE 1 CAPSULE BY MOUTH IN  THE MORNING AND MAY REPEAT  DOSE IN THE EVENING AS  NEEDED FOR ACID REFLUX /  INDIGESTION 03/02/20   [name], DO  ondansetron (ZOFRAN) 4 MG tablet Take 1 tablet (4 mg total) by mouth every 8 (eight) hours as needed for nausea or vomiting. 09/03/18   [name], DO  pravastatin (PRAVACHOL) 80 MG tablet TAKE 1 TABLET BY MOUTH AT  BEDTIME 01/03/22   Lelon Perla, MD  sennosides-docusate sodium (SENOKOT-S) 8.6-50 MG tablet Take 2 tablets by mouth daily. 06/18/18   Marchia Bond, MD  tetrahydrozoline 0.05 % ophthalmic solution Place 2 drops into both eyes daily as needed (for dry eyes).    [provider]  triamcinolone (NASACORT ALLERGY 24HR) 55 MCG/ACT AERO nasal inhaler Place 2 sprays into the nose daily. 09/03/18   [name], DO    Family History    Family History  Problem Relation Age of Onset   Heart disease Other    Skin cancer Mother    Stroke Mother    Prostate cancer Father    High blood pressure Father    Heart attack Father    She indicated that the status of her mother is unknown. She indicated that the status of her father is unknown. She indicated that the status of her other is  unknown.  Social History    Social History   Socioeconomic History   Marital status: Widowed    Spouse name: Not on file   Number of children: 1   Years of education: 28   Highest education level: 12th grade  Occupational History   Occupation: Theatre manager    Comment:  retired  Tobacco Use   Smoking status: Never   Smokeless tobacco: Never  Vaping Use   Vaping Use: Never used  Substance and Sexual Activity   Alcohol use: No   Drug use: No   Sexual activity: Not Currently    Partners: Male  Other Topics Concern   Not on file  Social History Narrative   No exercise. 2 cups of coffee a day    Social Determinants of Health   Financial Resource Strain: Low Risk  (03/10/2019)   Overall Financial Resource Strain (CARDIA)    Difficulty of Paying Living Expenses: Not hard at all  Food Insecurity: No Food Insecurity (03/10/2019)   Hunger Vital Sign    Worried About Running Out of Food in the Last Year: Never true    Ran Out of Food in the Last Year: Never true  Transportation Needs: No Transportation Needs (03/10/2019)   PRAPARE - Hydrologist (Medical): No    Lack of Transportation (Non-Medical): No  Physical Activity: Inactive (03/10/2019)   Exercise Vital Sign    Days of Exercise per Week: 0 days    Minutes of Exercise per Session: 0 min  Stress: No Stress Concern Present (03/10/2019)   [place]    Feeling of Stress : Not at all  Social Connections: Socially Isolated (03/10/2019)   Social Connection and Isolation Panel [NHANES]    Frequency of Communication with Friends and Family: Once a week    Frequency of Social Gatherings with Friends and Family: Once a week    Attends Religious Services: 1 to 4 times per year    Active Member of [employer] or Organizations: No    Attends Archivist Meetings: Never    Marital Status: Widowed  Intimate Partner Violence: Not At Risk  (03/10/2019)   Humiliation, Afraid, Rape, and Kick questionnaire    Fear of Current or Ex-Partner: No    Emotionally Abused: No    Physically Abused: No    Sexually Abused: No     Review of Systems    General:  No chills, fever, night sweats or weight changes.  Cardiovascular:  No chest pain, dyspnea on exertion, edema, orthopnea, palpitations, paroxysmal nocturnal dyspnea. Dermatological: No rash, lesions/masses Respiratory: No cough, dyspnea Urologic: No hematuria, dysuria Abdominal:   No nausea, vomiting, diarrhea, bright red blood per rectum, melena, or hematemesis Neurologic:  No visual changes, wkns, changes in mental status. All other systems reviewed and are otherwise negative except as noted above.  Physical Exam    VS:  BP (!) 116/58   Pulse 86   Ht 5' 5" (1.651 m)   Wt 185 lb 3.2 oz (84 kg)   SpO2 95%   BMI 30.82 kg/m  , BMI Body mass index is 30.82 kg/m. GEN: Well nourished, well developed, in no acute distress. HEENT: normal. Neck: Supple, no JVD, carotid bruits, or masses. Cardiac: RRR, no murmurs, rubs, or gallops. No clubbing, cyanosis, edema.  Radials/DP/PT 2+ and equal bilaterally.  Respiratory:  Respirations regular and unlabored, clear to auscultation bilaterally. GI: Soft, nontender, nondistended, BS + x 4. MS: no deformity or atrophy. Skin: warm and dry, no rash. Neuro:  Strength and sensation are intact. Psych: Normal affect.  Accessory Clinical Findings    Recent Labs: 01/16/2022: TSH 1.18 02/22/2022: BUN 19; Creatinine, Ser 1.02; Potassium 4.2; Sodium 138   Recent Lipid Panel    Component Value Date/Time   CHOL  145 01/16/2022 0000   TRIG 171 (H) 01/16/2022 0000   HDL 47 (L) 01/16/2022 0000   CHOLHDL 3.1 01/16/2022 0000   LDLCALC 73 01/16/2022 0000    ECG personally reviewed by me today-none today.  Echocardiogram 03/09/2021  IMPRESSIONS     1. Left ventricular ejection fraction, by estimation, is 60 to 65%. Left  ventricular  ejection fraction by PLAX is 65 %. The left ventricle has  normal function. The left ventricle has no regional wall motion  abnormalities. There is mild concentric left  ventricular hypertrophy. Left ventricular diastolic parameters are  consistent with Grade I diastolic dysfunction (impaired relaxation).  Elevated left ventricular end-diastolic pressure.   2. Right ventricular systolic function is normal. The right ventricular  size is normal.   3. The mitral valve is grossly normal. Trivial mitral valve  regurgitation. No evidence of mitral stenosis.   4. The aortic valve is tricuspid. There is mild calcification of the  aortic valve. There is mild thickening of the aortic valve. Aortic valve  regurgitation is mild to moderate. Mild aortic valve stenosis. Aortic  regurgitation PHT measures 344 msec.  Aortic valve area, by VTI measures 1.28 cm. Aortic valve mean gradient  measures 16.0 mmHg. Aortic valve Vmax measures 2.64 m/s.   5. The inferior vena cava is normal in size with greater than 50%  respiratory variability, suggesting right atrial pressure of 3 mmHg.   Echocardiogram 02/22/2022  IMPRESSIONS     1. Left ventricular ejection fraction, by estimation, is 55 to 60%. The  left ventricle has normal function. The left ventricle has no regional  wall motion abnormalities. There is mild concentric left ventricular  hypertrophy. Left ventricular diastolic  parameters are consistent with Grade I diastolic dysfunction (impaired  relaxation).   2. Right ventricular systolic function is normal. The right ventricular  size is normal. There is normal pulmonary artery systolic pressure. The  estimated right ventricular systolic pressure is 70.1 mmHg.   3. The mitral valve is normal in structure. Trivial mitral valve  regurgitation. No evidence of mitral stenosis.   4. The aortic valve is tricuspid. There is severe calcifcation of the  aortic valve. Aortic valve regurgitation is mild.  Moderate aortic valve  stenosis. Aortic valve area, by VTI measures 1.06 cm. Aortic valve mean  gradient measures 21.0 mmHg.   5. The inferior vena cava is normal in size with greater than 50%  respiratory variability, suggesting right atrial pressure of 3 mmHg.  Assessment & Plan   1.  Essential hypertension-BP today 116/58.  Blood pressure continues to be labile at home.  Notices irregular heartbeat with fluctuations in blood pressure.  Has noticed increased blood pressures in the evenings up to 180's over 80s.  Follow-up BMP stable. Continue amlodipine, hydrochlorothiazide,  metoprolol Continue to cut back on caffeine Continue valsartan 80 mg daily Heart healthy low-sodium diet-salty 6 given Increase physical activity as tolerated Maintain blood pressure log  Paroxysmal atrial fibrillation, SVT-heart rate today 86 bpm.  Previously identified during device check.  She was also noted to have prior NSVT and PVCs.  It was felt to be likely related to AVNRT.  Dr. Rayann Heman offered ablation but she declined.  She was not noted to have episodes of A-fib. Continue metoprolol Avoid triggers caffeine, chocolate, EtOH, dehydration etc.-reviewed Heart healthy low-sodium diet Increase physical activity as tolerated  Aortic stenosis-denies increased activity tolerance and DOE.  Follow-up echocardiogram 02/22/2022 showed normal EF, G1 DD, trivial mitral valve regurgitation, moderate aortic valve  stenosis with a mean gradient of 21 mmHg.  Echocardiogram 8/22 showed mild aortic stenosis with mean gradient of 16 mmHg. Plan for repeat echocardiogram 9/24  PPM-denies further syncopal events.  Had syncopal episode 9/21.  Was found to be bradycardic and underwent PPM.  Previously recommended that she contact EP for remote download for that period of time to see if there is correlation between symptoms of pressure in her neck and throat. Follow-up with the EP  Disposition: Follow-up with Dr. Stanford Breed or me in 6  months.   Jossie Ng. Husein Guedes NP-C     03/15/2022, 4:20 PM [facility] [place] [address] Office [phone] Fax [phone]  Notice: This dictation was prepared with Dragon dictation along with smaller phrase technology. Any transcriptional errors that result from this process are unintentional and may not be corrected upon review.  I spent 15 minutes examining this patient, reviewing medications, and using patient centered shared decision making involving her cardiac care.  Prior to her visit I spent greater than 20 minutes reviewing her past medical history,  medications, and prior cardiac tests.

## 2022-03-14 NOTE — Progress Notes (Signed)
Per order, Changed device settings for MRI to DOO at 15 bpm above presenting.  (DOO 95)  Tachy-therapies to off if applicable.   Will program device back to pre-MRI settings after completion of exam.

## 2022-03-14 NOTE — Progress Notes (Signed)
Informed of MRI for today.   Device system confirmed to be MRI conditional, with implant date > 6 weeks ago, and no evidence of abandoned or epicardial leads in review of most recent CXR Interrogation from today reviewed, pt is currently AP-VS at ~ 60 bpm  Change device settings for MRI to DOO at 15 bpm above presenting.   Tachy-therapies to off if applicable.  Program device back to pre-MRI settings after completion of exam.  Shirley Friar, PA-C  03/14/2022 1:30 PM

## 2022-03-15 ENCOUNTER — Ambulatory Visit: Payer: Medicare Other | Attending: General Practice | Admitting: General Practice

## 2022-03-15 ENCOUNTER — Encounter: Payer: Self-pay | Admitting: General Practice

## 2022-03-15 ENCOUNTER — Telehealth: Payer: Self-pay

## 2022-03-15 VITALS — BP 116/58 | HR 86 | Ht 65.0 in | Wt 185.2 lb

## 2022-03-15 DIAGNOSIS — I350 Nonrheumatic aortic (valve) stenosis: Secondary | ICD-10-CM | POA: Diagnosis not present

## 2022-03-15 DIAGNOSIS — I480 Paroxysmal atrial fibrillation: Secondary | ICD-10-CM | POA: Diagnosis not present

## 2022-03-15 DIAGNOSIS — R072 Precordial pain: Secondary | ICD-10-CM | POA: Insufficient documentation

## 2022-03-15 DIAGNOSIS — Z950 Presence of cardiac pacemaker: Secondary | ICD-10-CM | POA: Insufficient documentation

## 2022-03-15 DIAGNOSIS — I471 Supraventricular tachycardia: Secondary | ICD-10-CM | POA: Insufficient documentation

## 2022-03-15 DIAGNOSIS — I10 Essential (primary) hypertension: Secondary | ICD-10-CM | POA: Insufficient documentation

## 2022-03-15 DIAGNOSIS — M5416 Radiculopathy, lumbar region: Secondary | ICD-10-CM | POA: Diagnosis not present

## 2022-03-15 DIAGNOSIS — M48062 Spinal stenosis, lumbar region with neurogenic claudication: Secondary | ICD-10-CM | POA: Diagnosis not present

## 2022-03-15 DIAGNOSIS — Z6830 Body mass index (BMI) 30.0-30.9, adult: Secondary | ICD-10-CM | POA: Diagnosis not present

## 2022-03-15 NOTE — Telephone Encounter (Signed)
Discussed pt with Coletta Memos, FNP he states pt does not need to do download, will discuss again at appt today Pt daughter notified, they will discuss at appt today.

## 2022-03-15 NOTE — Telephone Encounter (Signed)
   Pre-operative Risk Assessment    Patient Name: [name]  DOB: Oct 09, 1934 MRN: [id number]      Request for Surgical Clearance    Procedure:   L4-5 LAMI PSF/TLIF  Date of Surgery:  Clearance TBD                                 Surgeon:  Starling Manns, MD, FAAOS Surgeon's Group or Practice Name:  [facility] Phone number:  [phone] Fax number:  [phone]   Type of Clearance Requested:   - Medical    Type of Anesthesia:  General    Additional requests/questions:

## 2022-03-15 NOTE — Patient Instructions (Signed)
Medication Instructions:  The current medical regimen is effective;  continue present plan and medications as directed. Please refer to the Current Medication list given to you today.   *If you need a refill on your cardiac medications before your next appointment, please call your pharmacy*  Lab Work:   Testing/Procedures:  NONE    NONE If you have labs (blood work) drawn today and your tests are completely normal, you will receive your results only by:  1-MyChart Message (if you have MyChart) OR  2-A paper copy in the mail.  If you have any lab test that is abnormal or we need to change your treatment, we will call you to review the results.  Special Instructions MAKE SURE TO DO YOUR DEVICE DOWNLOAD BEFORE YOUR   Follow-Up: Your next appointment:  6 month(s) In Person with Kirk Ruths, MD   [place], MD FIRST AVAILABLE  At [facility], you and your health needs are our priority.  As part of our continuing mission to provide you with exceptional heart care, we have created designated Provider Care Teams.  These Care Teams include your primary Cardiologist (physician) and Advanced Practice Providers (APPs -  Physician Assistants and Nurse Practitioners) who all work together to provide you with the care you need, when you need it.  Important Information About Sugar

## 2022-03-16 ENCOUNTER — Ambulatory Visit
Admission: EM | Admit: 2022-03-16 | Discharge: 2022-03-16 | Disposition: A | Discharge status: Home / Self Care | Payer: Medicare Other | Attending: Family Medicine | Admitting: Family Medicine

## 2022-03-16 ENCOUNTER — Encounter: Payer: Self-pay | Admitting: Emergency Medicine

## 2022-03-16 DIAGNOSIS — Z20822 Contact with and (suspected) exposure to covid-19: Secondary | ICD-10-CM | POA: Diagnosis not present

## 2022-03-16 DIAGNOSIS — M791 Myalgia, unspecified site: Secondary | ICD-10-CM | POA: Insufficient documentation

## 2022-03-16 DIAGNOSIS — H9202 Otalgia, left ear: Secondary | ICD-10-CM | POA: Insufficient documentation

## 2022-03-16 LAB — SARS CORONAVIRUS 2 BY RT PCR: SARS Coronavirus 2 by RT PCR: NEGATIVE

## 2022-03-16 NOTE — ED Triage Notes (Signed)
Patient c/o left ear pain x 3 days, pain in the back of her head.  Patient has taken Tylenol for pain.

## 2022-03-16 NOTE — Discharge Instructions (Signed)
May take Tylenol as needed for pain.  If your COVID-19 test is positive, isolate yourself for five days from today.  At the end of five days you may end isolation if your symptoms have cleared or improved, and you have not had a fever for 24 hours. At this time you should wear a mask for five more days when you are around others.   If symptoms become significantly worse during the night or over the weekend, proceed to the local emergency room.

## 2022-03-16 NOTE — Telephone Encounter (Signed)
IF ADDITIONAL MD CLEARANCE IS NEEDED REQUESTING PARTY IS WANTING CLEARANCE FOR ALLRED AND CRENSHAW

## 2022-03-16 NOTE — ED Provider Notes (Signed)
[name]    CSN: [id number] Arrival date & time: 03/16/22  1239      History   Chief Complaint Chief Complaint  Patient presents with   Otalgia    HPI [name] is a 87 y.o. female.   Patient complains of throbbing left earache for 4 days.  She has also noticed pain in her left neck when swallowing and soreness in her left neck.  She developed myalgias yesterday.  She denies nasal congestion, cough, and fevers, chills, and sweats.  The history is provided by the patient.    Past Medical History:  Diagnosis Date   Arthritis    Asthma    Atrial fibrillation (HCC)    paroxysmal , addressed  with metoprolol    Chronic sinusitis    Complication of anesthesia    Dyspnea    sob on exertion, reports at her pre-op today 09-13-2018 that her cardiologist has planned for her to do a ECHO for evaluation    Family history of adverse reaction to anesthesia    sister is hard to wake up from Anesthesia   Follicular lymphoma ([place])    received Chemotherapy (2 years ago)  and radiation (last 12/2017)   Hyperlipidemia    Hypertension    Hypothyroidism    Meniere's disease    PONV (postoperative nausea and vomiting)    Primary localized osteoarthritis of left knee 09/24/2018   Primary localized osteoarthritis of right knee 06/18/2018   Reflux     Patient Active Problem List   Diagnosis Date Noted   SVT (supraventricular tachycardia) ([place]) 12/23/2021   Osteopenia 04/29/2021   Asthma without status asthmaticus 03/21/2021   Heart block 04/07/2020   Pacemaker 04/07/2020   Syncope 03/28/2020   Shingles 10/24/2019   MTHFR gene mutation 10/09/2018   Primary localized osteoarthritis of left knee 09/24/2018   S/P left unicompartmental knee replacement 09/24/2018   Fatigue 09/03/2018   Myalgia 09/03/2018   Vertigo 09/03/2018   Sebaceous cyst 09/03/2018   Callus of foot 09/03/2018   Primary localized osteoarthritis of right knee 06/18/2018   Status post right partial  knee replacement 06/18/2018   Nasal polyposis 05/16/2018   Class 1 obesity due to excess calories with serious comorbidity and body mass index (BMI) of 30.0 to 30.9 in adult 07/16/2017   DDD (degenerative disc disease), cervical 07/16/2017   GERD (gastroesophageal reflux disease) 07/16/2017   Other hyperlipidemia 09/27/2016   Paroxysmal atrial fibrillation ([place]) 09/27/2016   Benign essential hypertension 95/62/1308   Follicular lymphoma ([place]) 12/18/2014   Lymphoma ([place]) 07/28/2014   Hypothyroidism 01/14/2014   Acquired trigger finger 08/05/2013   Carpal tunnel syndrome on both sides 06/30/2013   Dyspnea 12/25/2012    Past Surgical History:  Procedure Laterality Date   ABDOMINAL HYSTERECTOMY     BONE MARROW BIOPSY  06/16/2014   at [place]     bilateral   CHOLECYSTECTOMY     KNEE ARTHROSCOPY     PACEMAKER IMPLANT N/A 03/30/2020   Procedure: PACEMAKER IMPLANT;  Surgeon: Thompson Grayer, MD;  Location: [facility];  Service: Cardiovascular;  Laterality: N/A;   PARTIAL KNEE ARTHROPLASTY Right 06/18/2018   Procedure: UNICOMPARTMENTAL KNEE;  Surgeon: Marchia Bond, MD;  Location: [facility];  Service: Orthopedics;  Laterality: Right;   PARTIAL KNEE ARTHROPLASTY Left 09/24/2018   Procedure: UNICOMPARTMENTAL KNEE;  Surgeon: Marchia Bond, MD;  Location: [facility];  Service: Orthopedics;  Laterality: Left;    OB History  No obstetric history on file.      Home Medications    Prior to Admission medications   Medication Sig Start Date End Date Taking? Authorizing Provider  albuterol (VENTOLIN HFA) 108 (90 Base) MCG/ACT inhaler Inhale 2 puffs into the lungs every 6 (six) hours as needed for wheezing or shortness of breath. 11/23/20  Yes Brunetta Jeans, PA-C  amLODipine (NORVASC) 10 MG tablet Take 1 tablet (10 mg total) by mouth daily. 11/25/21  Yes Lelon Perla, MD  aspirin EC 81 MG tablet Take 81 mg by mouth daily.   Yes [provider]   baclofen (LIORESAL) 10 MG tablet TAKE 1 TABLET BY MOUTH 3  TIMES DAILY AS NEEDED FOR  MUSCLE SPASMS 02/08/21  Yes [name], DO  Clobetasol Propionate 0.05 % shampoo Apply 1 application topically daily. As needed for scalp irritation from psoriasis 03/23/21  Yes [name], DO  conjugated estrogens (PREMARIN) vaginal cream Place 1 Applicatorful vaginally daily. 01/16/22  Yes Samuel Bouche, NP  fluticasone (FLOVENT HFA) 110 MCG/ACT inhaler Inhale 2 puffs into the lungs 2 (two) times daily as needed (for shortness of breath or wheezing). 03/23/21  Yes [name], DO  gabapentin (NEURONTIN) 100 MG capsule TAKE 1 CAPSULE BY MOUTH  TWICE DAILY AS NEEDED 06/28/20  Yes [name], DO  hydrochlorothiazide (HYDRODIURIL) 12.5 MG tablet TAKE 1 TABLET BY MOUTH  DAILY 10/27/21  Yes Lelon Perla, MD  isosorbide mononitrate (IMDUR) 30 MG 24 hr tablet TAKE 1 TABLET BY MOUTH  DAILY 01/03/22  Yes Lelon Perla, MD  levothyroxine (SYNTHROID) 112 MCG tablet TAKE 1 TABLET BY MOUTH  DAILY BEFORE BREAKFAST 01/03/22  Yes Lelon Perla, MD  meclizine (ANTIVERT) 25 MG tablet Take 1 tablet (25 mg total) by mouth 3 (three) times daily as needed for dizziness. 10/10/18  Yes [name], DO  meloxicam (MOBIC) 15 MG tablet Take 15 mg by mouth daily as needed for pain.   Yes [provider]  metoprolol tartrate (LOPRESSOR) 25 MG tablet Take 1 tablet (25 mg total) by mouth every evening. 01/04/22  Yes Allred, Jeneen Rinks, MD  metoprolol tartrate (LOPRESSOR) 50 MG tablet Take 1 tablet (50 mg total) by mouth 2 (two) times daily. Pt takes metoprolol 50 mg in AM and 75 mg in evening. 01/04/22  Yes Allred, Jeneen Rinks, MD  mometasone (ELOCON) 0.1 % cream Apply 1 application. topically daily. To vaginal area as needed for redness/irritation. Can use twice daily for the first week 09/22/21  Yes Samuel Bouche, NP  omeprazole (PRILOSEC) 40 MG capsule TAKE 1 CAPSULE BY MOUTH IN  THE MORNING AND MAY REPEAT  DOSE  IN THE EVENING AS  NEEDED FOR ACID REFLUX /  INDIGESTION 03/02/20  Yes [name], DO  ondansetron (ZOFRAN) 4 MG tablet Take 1 tablet (4 mg total) by mouth every 8 (eight) hours as needed for nausea or vomiting. 09/03/18  Yes [name], DO  sennosides-docusate sodium (SENOKOT-S) 8.6-50 MG tablet Take 2 tablets by mouth daily. 06/18/18  Yes Marchia Bond, MD  tetrahydrozoline 0.05 % ophthalmic solution Place 2 drops into both eyes daily as needed (for dry eyes).   Yes [provider]  triamcinolone (NASACORT ALLERGY 24HR) 55 MCG/ACT AERO nasal inhaler Place 2 sprays into the nose daily. 09/03/18  Yes [name], DO  valsartan (DIOVAN) 80 MG tablet Take 1 tablet (80 mg total) by mouth daily. 02/02/22  Yes Cleaver, Jossie Ng, NP    Family History Family History  Problem Relation  Age of Onset   Skin cancer Mother    Stroke Mother    Prostate cancer Father    High blood pressure Father    Heart attack Father    Heart disease Other     Social History Social History   Tobacco Use   Smoking status: Never   Smokeless tobacco: Never  Vaping Use   Vaping Use: Never used  Substance Use Topics   Alcohol use: No   Drug use: No     Allergies   Patient has no known allergies.   Review of Systems Review of Systems ? sore throat No cough No pleuritic pain No wheezing No nasal congestion No post-nasal drainage No sinus pain/pressure No itchy/red eyes + left earache No hemoptysis No SOB No fever/chills No nausea No vomiting No abdominal pain No diarrhea No urinary symptoms No skin rash + fatigue + myalgias No headache   Physical Exam Triage Vital Signs ED Triage Vitals  Enc Vitals Group     BP 03/16/22 1336 (!) 160/71     Pulse Rate 03/16/22 1336 (!) 57     Resp 03/16/22 1336 18     Temp 03/16/22 1336 99.7 F (37.6 C)     Temp Source 03/16/22 1336 Oral     SpO2 03/16/22 1336 95 %     Weight --      Height --      Head Circumference --       Peak Flow --      Pain Score 03/16/22 1337 7     Pain Loc --      Pain Edu? --      Excl. in [place]? --    No data found.  Updated Vital Signs BP (!) 160/71 (BP Location: Right Arm)   Pulse (!) 57   Temp 99.7 F (37.6 C) (Oral)   Resp 18   SpO2 95%   Visual Acuity Right Eye Distance:   Left Eye Distance:   Bilateral Distance:    Right Eye Near:   Left Eye Near:    Bilateral Near:     Physical Exam Nursing notes and Vital Signs reviewed. Appearance:  Patient appears stated age, and in no acute distress Eyes:  Pupils are equal, round, and reactive to light and accomodation.  Extraocular movement is intact.  Conjunctivae are not inflamed  Ears:  Canals normal.  Tympanic membranes normal. No TMJ tenderness.  No tenderness over left parotid gland. Nose: Normal turbinates.  No sinus tenderness. Pharynx:  Normal Neck:  Supple.  Mildly enlarged lateral nodes are present, tender to palpation on the left.   Lungs:  Clear to auscultation.  Breath sounds are equal.  Moving air well. Heart:  Regular rate and rhythm without murmurs, rubs, or gallops.  Abdomen:  Nontender without masses or hepatosplenomegaly.  Bowel sounds are present.  No CVA or flank tenderness.  Extremities:  No edema.  Skin:  No rash present.   UC Treatments / Results  Labs (all labs ordered are listed, but only abnormal results are displayed) Labs Reviewed  SARS CORONAVIRUS 2 BY RT PCR    EKG   Radiology   Procedures Procedures (including critical care time)  Medications Ordered in UC Medications - No data to display  Initial Impression / Assessment and Plan / UC Course  I have reviewed the triage vital signs and the nursing notes.  Pertinent labs & imaging results that were available during my care of the patient were reviewed by  me and considered in my medical decision making (see chart for details).    There is no evidence of bacterial infection today.  Suspect early viral syndrome.  Treat  symptomatically for now. COVID19 PCR pending.  Final Clinical Impressions(s) / UC Diagnoses   Final diagnoses:  Otalgia of left ear     Discharge Instructions      May take Tylenol as needed for pain.  If your COVID-19 test is positive, isolate yourself for five days from today.  At the end of five days you may end isolation if your symptoms have cleared or improved, and you have not had a fever for 24 hours. At this time you should wear a mask for five more days when you are around others.   If symptoms become significantly worse during the night or over the weekend, proceed to the local emergency room.         ED Prescriptions   None       Kandra Nicolas, MD 03/18/22 1450

## 2022-03-17 NOTE — Telephone Encounter (Signed)
I have sent a message to Gracy Bruins, EP scheduler. She can try and call the pt Monday when she is back in the office. I will send FYI to requesting office as the plan as of now. Pt needs EP appt before she can be cleared.

## 2022-03-17 NOTE — Telephone Encounter (Signed)
Jillian Amass, do we need to send a message to EP scheduler [place]? Pt has a remote device check 03/29/22 it looks like.

## 2022-03-20 ENCOUNTER — Other Ambulatory Visit: Payer: Self-pay | Admitting: Cardiology

## 2022-03-20 DIAGNOSIS — Z950 Presence of cardiac pacemaker: Secondary | ICD-10-CM

## 2022-03-20 NOTE — Telephone Encounter (Signed)
Pt scheduled to see Tommye Standard, [facility] 03/27/22. See previous notes. Will send FYI to requesting office the pt has appt 03/27/22.

## 2022-03-23 NOTE — Progress Notes (Unsigned)
Cardiology Office Note Date:  03/23/2022  Patient ID:  Jillian, Greer [birth year], MRN [id number] PCP:  Samuel Bouche, NP  Cardiologist:  Dr. Stanford Breed Electrophysiologist: Dr. Rayann Heman  ***refresh   Chief Complaint: *** pre-op  History of Present Illness: [name] is a 87 y.o. female with history of HTN, HLD, hypothyroidism, symptomatic bradycardia w/PPM, SVT, NSVT/PVCs.  She was last seen by Dr. Rayann Heman June 2023, she was having some palpitations c/w an SVT, and known labile BPs being managed with Dr. Stanford Breed. SVTs noted on device (again), suspected to be AVNRT and offered ablation but pt declined.  She is also known to have previously PVCs/NSVT, felt best treated conservatively for these given advanced age. Her metoprolol dosing adjusted to 62m AM and same 766mPM with her reports of low BP after AM medicines.  She has seen J. Cleaver, NP a couple times since then, most recently 03/15/22 f/u on her BP's recent medication changes.reported some correlation with higher BPs and higher HRs, advised to f/u with EP for this. Reminded to reduce sodium and increase exercise to her ability, with c/o back pain.  She is pending back procedure , laminectomy, spinal fusion/TLIF, not yet scheduled. [name] best seen by EP for final recs given her palpitations.  *** palps *** SVT burden *** VT burden ? *** BP?? *** more BB?  RCRI score is zero, 0.4% risk   Device information MDT dual chamber PPM implanted 03/30/2020   Past Medical History:  Diagnosis Date   Arthritis    Asthma    Atrial fibrillation (HCC)    paroxysmal , addressed  with metoprolol    Chronic sinusitis    Complication of anesthesia    Dyspnea    sob on exertion, reports at her pre-op today 09-13-2018 that her cardiologist has planned for her to do a ECHO for evaluation    Family history of adverse reaction to anesthesia    sister is hard to wake up from Anesthesia   [place]   received  Chemotherapy (2 years ago)  and radiation (last 12/2017)   Hyperlipidemia    Hypertension    Hypothyroidism    Meniere's disease    PONV (postoperative nausea and vomiting)    Primary localized osteoarthritis of left knee 09/24/2018   Primary localized osteoarthritis of right knee 06/18/2018   Reflux     Past Surgical History:  Procedure Laterality Date   ABDOMINAL HYSTERECTOMY     BONE MARROW BIOPSY  06/16/2014   [place]   bilateral   CHOLECYSTECTOMY     KNEE ARTHROSCOPY     PACEMAKER IMPLANT N/A 03/30/2020   Procedure: PACEMAKER IMPLANT;  Surgeon: AlThompson GrayerMD;  [facility];  Service: Cardiovascular;  Laterality: N/A;   PARTIAL KNEE ARTHROPLASTY Right 06/18/2018   Procedure: UNICOMPARTMENTAL KNEE;  Surgeon: LaMarchia BondMD;  [facility];  Service: Orthopedics;  Laterality: Right;   PARTIAL KNEE ARTHROPLASTY Left 09/24/2018   Procedure: UNICOMPARTMENTAL KNEE;  Surgeon: LaMarchia BondMD;  [facility];  Service: Orthopedics;  Laterality: Left;    Current Outpatient Medications  Medication Sig Dispense Refill   albuterol (VENTOLIN HFA) 108 (90 Base) MCG/ACT inhaler Inhale 2 puffs into the lungs every 6 (six) hours as needed for wheezing or shortness of breath. 8 g 0   amLODipine (NORVASC) 10 MG tablet Take 1 tablet (10 mg total) by mouth daily.  aspirin EC 81 MG tablet Take 81 mg by mouth daily.     baclofen (LIORESAL) 10 MG tablet TAKE 1 TABLET BY MOUTH 3  TIMES DAILY AS NEEDED FOR  MUSCLE SPASMS 270 tablet 0   Clobetasol Propionate 0.05 % shampoo Apply 1 application topically daily. As needed for scalp irritation from psoriasis 236 mL 3   conjugated estrogens (PREMARIN) vaginal cream Place 1 Applicatorful vaginally daily. 42.5 g 12   fluticasone (FLOVENT HFA) 110 MCG/ACT inhaler Inhale 2 puffs into the lungs 2 (two) times daily as needed (for shortness of breath or wheezing). 3 each 11   gabapentin (NEURONTIN)  100 MG capsule TAKE 1 CAPSULE BY MOUTH  TWICE DAILY AS NEEDED 180 capsule 2   hydrochlorothiazide (HYDRODIURIL) 12.5 MG tablet TAKE 1 TABLET BY MOUTH  DAILY 90 tablet 3   isosorbide mononitrate (IMDUR) 30 MG 24 hr tablet TAKE 1 TABLET BY MOUTH  DAILY 90 tablet 2   levothyroxine (SYNTHROID) 112 MCG tablet TAKE 1 TABLET BY MOUTH  DAILY BEFORE BREAKFAST 90 tablet 2   meclizine (ANTIVERT) 25 MG tablet Take 1 tablet (25 mg total) by mouth 3 (three) times daily as needed for dizziness. 30 tablet 0   meloxicam (MOBIC) 15 MG tablet Take 15 mg by mouth daily as needed for pain.     metoprolol tartrate (LOPRESSOR) 25 MG tablet Take 1 tablet (25 mg total) by mouth every evening. 90 tablet 3   metoprolol tartrate (LOPRESSOR) 50 MG tablet Take 1 tablet (50 mg total) by mouth 2 (two) times daily. Pt takes metoprolol 50 mg in AM and 75 mg in evening. 180 tablet 3   mometasone (ELOCON) 0.1 % cream Apply 1 application. topically daily. To vaginal area as needed for redness/irritation. Can use twice daily for the first week 100 g 1   omeprazole (PRILOSEC) 40 MG capsule TAKE 1 CAPSULE BY MOUTH IN  THE MORNING AND MAY REPEAT  DOSE IN THE EVENING AS  NEEDED FOR ACID REFLUX /  INDIGESTION 180 capsule 3   ondansetron (ZOFRAN) 4 MG tablet Take 1 tablet (4 mg total) by mouth every 8 (eight) hours as needed for nausea or vomiting. 10 tablet 0   sennosides-docusate sodium (SENOKOT-S) 8.6-50 MG tablet Take 2 tablets by mouth daily. 30 tablet 1   tetrahydrozoline 0.05 % ophthalmic solution Place 2 drops into both eyes daily as needed (for dry eyes).     triamcinolone (NASACORT ALLERGY 24HR) 55 MCG/ACT AERO nasal inhaler Place 2 sprays into the nose daily. 3 Inhaler 3   valsartan (DIOVAN) 80 MG tablet Take 1 tablet (80 mg total) by mouth daily. 30 tablet 0   No current facility-administered medications for this visit.    Allergies:   Patient has no known allergies.   Social History:  The patient  reports that she has never  smoked. She has never used smokeless tobacco. She reports that she does not drink alcohol and does not use drugs.   Family History:  The patient's family history includes Heart attack in her father; Heart disease in an other family member; High blood pressure in her father; Prostate cancer in her father; Skin cancer in her mother; Stroke in her mother.  ROS:  Please see the history of present illness.    All other systems are reviewed and otherwise negative.   PHYSICAL EXAM:  VS:  There were no vitals taken for this visit. BMI: There is no height or weight on file to calculate BMI. Well nourished,   well developed, in no acute distress HEENT: normocephalic, atraumatic Neck: no JVD, carotid bruits or masses Cardiac:  *** RRR; no significant murmurs, no rubs, or gallops Lungs:  *** CTA b/l, no wheezing, rhonchi or rales Abd: soft, nontender MS: no deformity or *** atrophy Ext: *** no edema Skin: warm and dry, no rash Neuro:  No gross deficits appreciated Psych: euthymic mood, full affect  *** PPM site is stable, no tethering or discomfort   EKG:  Done today and reviewed by myself shows  ***  Device interrogation done today and reviewed by myself:  ***   02/22/2022: TTE  1. Left ventricular ejection fraction, by estimation, is 55 to 60%. The  left ventricle has normal function. The left ventricle has no regional  wall motion abnormalities. There is mild concentric left ventricular  hypertrophy. Left ventricular diastolic  parameters are consistent with Grade I diastolic dysfunction (impaired  relaxation).   2. Right ventricular systolic function is normal. The right ventricular  size is normal. There is normal pulmonary artery systolic pressure. The  estimated right ventricular systolic pressure is 20.0 mmHg.   3. The mitral valve is normal in structure. Trivial mitral valve  regurgitation. No evidence of mitral stenosis.   4. The aortic valve is tricuspid. There is severe  calcifcation of the  aortic valve. Aortic valve regurgitation is mild. Moderate aortic valve  stenosis. Aortic valve area, by VTI measures 1.06 cm. Aortic valve mean  gradient measures 21.0 mmHg.   5. The inferior vena cava is normal in size with greater than 50%  respiratory variability, suggesting right atrial pressure of 3 mmHg.    03/09/2021; TTE  1. Left ventricular ejection fraction, by estimation, is 60 to 65%. Left  ventricular ejection fraction by PLAX is 65 %. The left ventricle has  normal function. The left ventricle has no regional wall motion  abnormalities. There is mild concentric left  ventricular hypertrophy. Left ventricular diastolic parameters are  consistent with Grade I diastolic dysfunction (impaired relaxation).  Elevated left ventricular end-diastolic pressure.   2. Right ventricular systolic function is normal. The right ventricular  size is normal.   3. The mitral valve is grossly normal. Trivial mitral valve  regurgitation. No evidence of mitral stenosis.   4. The aortic valve is tricuspid. There is mild calcification of the  aortic valve. There is mild thickening of the aortic valve. Aortic valve  regurgitation is mild to moderate. Mild aortic valve stenosis. Aortic  regurgitation PHT measures 344 msec.  Aortic valve area, by VTI measures 1.28 cm. Aortic valve mean gradient  measures 16.0 mmHg. Aortic valve Vmax measures 2.64 m/s.   5. The inferior vena cava is normal in size with greater than 50%  respiratory variability, suggesting right atrial pressure of 3 mmHg.    02/16/2021: stress myoview The left ventricular ejection fraction is moderately decreased (30-44%). Nuclear stress EF: 40%. Global hypokinesis There was no ST segment deviation noted during stress. This is an intermediate risk study based upon reduction in ejection fraction of 40%. Consider echocardiogram to correlate ejection fraction. Previous echocardiogram in 2021 showed EF of 60 to  65% There are no significant perfusion defects at rest or stress. No ischemia.   Recent Labs: 01/16/2022: TSH 1.18 02/22/2022: BUN 19; Creatinine, Ser 1.02; Potassium 4.2; Sodium 138  01/16/2022: Cholesterol 145; HDL 47; LDL Cholesterol (Calc) 73; Total CHOL/HDL Ratio 3.1; Triglycerides 171   CrCl cannot be calculated (Patient's most recent lab result is older than the maximum   21 days allowed.).   Wt Readings from Last 3 Encounters:  03/15/22 185 lb 3.2 oz (84 kg)  02/02/22 188 lb 12.8 oz (85.6 kg)  01/16/22 189 lb 1.3 oz (85.8 kg)     Other studies reviewed: Additional studies/records reviewed today include: summarized above  ASSESSMENT AND PLAN:  PPM ***  HTN Fairly labile by report/chart hx ***  SVT Suspect AVNRT ***  NSVT/PVC Felt best managed conservatively *** BB  Pre-op *** low cardiac risk score *** procedure not felt to be high cardaic risk procedure ***  usual peri-operative pacemaker management ***   Disposition: F/u with ***  Current medicines are reviewed at length with the patient today.  The patient did not have any concerns regarding medicines.  Signed, Renee Ursuy, PA-C 03/23/2022 12:42 PM     [facility] [address] [place] [address] [phone] (office)  [phone] (fax)   

## 2022-03-24 ENCOUNTER — Other Ambulatory Visit: Payer: Self-pay

## 2022-03-24 DIAGNOSIS — N904 Leukoplakia of vulva: Secondary | ICD-10-CM

## 2022-03-24 DIAGNOSIS — Z780 Asymptomatic menopausal state: Secondary | ICD-10-CM

## 2022-03-24 MED ORDER — MOMETASONE FUROATE 0.1 % EX CREA: 1.0000 | TOPICAL_CREAM | Freq: Every day | CUTANEOUS | 1 refills | Status: DC

## 2022-03-27 ENCOUNTER — Ambulatory Visit: Payer: Medicare Other | Admitting: Physician Assistant

## 2022-03-29 ENCOUNTER — Ambulatory Visit (INDEPENDENT_AMBULATORY_CARE_PROVIDER_SITE_OTHER): Payer: Medicare Other

## 2022-03-29 DIAGNOSIS — I441 Atrioventricular block, second degree: Secondary | ICD-10-CM

## 2022-03-29 LAB — CUP PACEART REMOTE DEVICE CHECK
Battery Remaining Longevity: 158 mo
Battery Voltage: 3.03 V
Brady Statistic AP VP Percent: 0.05 %
Brady Statistic AP VS Percent: 14.45 %
Brady Statistic AS VP Percent: 0.07 %
Brady Statistic AS VS Percent: 85.42 %
Brady Statistic RA Percent Paced: 18.31 %
Brady Statistic RV Percent Paced: 0.12 %
Date Time Interrogation Session: 20230920064557
Implantable Lead Implant Date: 20210921
Implantable Lead Implant Date: 20210921
Implantable Lead Location: 753859
Implantable Lead Location: 753860
Implantable Lead Model: 3830
Implantable Lead Model: 5076
Implantable Pulse Generator Implant Date: 20210921
Lead Channel Impedance Value: 323 Ohm
Lead Channel Impedance Value: 380 Ohm
Lead Channel Impedance Value: 551 Ohm
Lead Channel Impedance Value: 608 Ohm
Lead Channel Pacing Threshold Amplitude: 0.75 V
Lead Channel Pacing Threshold Amplitude: 1.125 V
Lead Channel Pacing Threshold Pulse Width: 0.4 ms
Lead Channel Pacing Threshold Pulse Width: 0.4 ms
Lead Channel Sensing Intrinsic Amplitude: 13.875 mV
Lead Channel Sensing Intrinsic Amplitude: 13.875 mV
Lead Channel Sensing Intrinsic Amplitude: 3 mV
Lead Channel Sensing Intrinsic Amplitude: 3 mV
Lead Channel Setting Pacing Amplitude: 2 V
Lead Channel Setting Pacing Amplitude: 2.25 V
Lead Channel Setting Pacing Pulse Width: 0.4 ms
Lead Channel Setting Sensing Sensitivity: 0.9 mV

## 2022-04-05 NOTE — Progress Notes (Signed)
Cardiology Office Note Date:  04/05/2022  Patient ID:  Jillian, [name] 1934/09/17, MRN [id number] PCP:  Samuel Bouche, NP  Cardiologist:  Dr. Stanford Breed Electrophysiologist: Dr. Rayann Heman    Chief Complaint:  pre-op  History of Present Illness: [name] is a 87 y.o. female with history of HTN, HLD, hypothyroidism, symptomatic bradycardia w/PPM, SVT, NSVT/PVCs.  She was last seen by Dr. Rayann Heman June 2023, she was having some palpitations c/w an SVT, and known labile BPs being managed with Dr. Stanford Breed. SVTs noted on device (again), suspected to be AVNRT and offered ablation but pt declined.  She is also known to have previously PVCs/NSVT, felt best treated conservatively for these given advanced age. Her metoprolol dosing adjusted to 61m AM and same 713mPM with her reports of low BP after AM medicines.  She has seen J. Cleaver, NP a couple times since then, most recently 03/15/22 f/u on her BP's recent medication changes.reported some correlation with higher BPs and higher HRs, advised to f/u with EP for this. Reminded to reduce sodium and increase exercise to her ability, with c/o back pain.  She is pending back procedure , laminectomy, spinal fusion/TLIF, not yet scheduled. JeDenyse Amasselt best seen by EP for final recs given her palpitations.  Dr. MeMyles Gipeviewed a remote, recommended to consider a 1st step to increase BB.  Saw PMD team 04/07/22, felt  to be a good candidate for surgery from their perspective.  TODAY She is accompanied by her daughter She is becoming increasingly bothered by her tachycardia.  When she gets them they are very anxiety provoking and make her feel poorly. She feels her heart racing/pounding in her throat,, make her SOB, weak. No CP She has not had syncope roth them She had two episodes yesterday  She has occasional fleeting episode so of near syncope. These seemed to get better after PPM, though have creped in again, she can be seated, doing nothing  and suddenly feel like she is going t faint and then is gone.  There is no trigger/pattern for either.  She c/o DOE, no rest SOB but poor exertional capacity that has been regressive for perhaps 6-8 months. She mentions her heart murmur that she was told her echo did not explain her SOB  They are quite worried about her fluctuating BP.  She really wants to get her heart rhythm settled and would very much like to be re-evaluated for possible ablation.   Device information MDT dual chamber PPM implanted 03/30/2020   Past Medical History:  Diagnosis Date   Arthritis    Asthma    Atrial fibrillation (HCC)    paroxysmal , addressed  with metoprolol    Chronic sinusitis    Complication of anesthesia    Dyspnea    sob on exertion, reports at her pre-op today 09-13-2018 that her cardiologist has planned for her to do a ECHO for evaluation    Family history of adverse reaction to anesthesia    sister is hard to wake up from Anesthesia   [place]   received Chemotherapy (2 years ago)  and radiation (last 12/2017)   Hyperlipidemia    Hypertension    Hypothyroidism    Meniere's disease    PONV (postoperative nausea and vomiting)    Primary localized osteoarthritis of left knee 09/24/2018   Primary localized osteoarthritis of right knee 06/18/2018   Reflux     Past Surgical History:  Procedure Laterality Date  ABDOMINAL HYSTERECTOMY     BONE MARROW BIOPSY  06/16/2014   at [facility]     bilateral   CHOLECYSTECTOMY     KNEE ARTHROSCOPY     PACEMAKER IMPLANT N/A 03/30/2020   Procedure: PACEMAKER IMPLANT;  Surgeon: Thompson Grayer, MD;  Location: [facility];  Service: Cardiovascular;  Laterality: N/A;   PARTIAL KNEE ARTHROPLASTY Right 06/18/2018   Procedure: UNICOMPARTMENTAL KNEE;  Surgeon: Marchia Bond, MD;  Location: [facility];  Service: Orthopedics;  Laterality: Right;   PARTIAL KNEE ARTHROPLASTY Left 09/24/2018   Procedure:  UNICOMPARTMENTAL KNEE;  Surgeon: Marchia Bond, MD;  Location: [facility];  Service: Orthopedics;  Laterality: Left;    Current Outpatient Medications  Medication Sig Dispense Refill   albuterol (VENTOLIN HFA) 108 (90 Base) MCG/ACT inhaler Inhale 2 puffs into the lungs every 6 (six) hours as needed for wheezing or shortness of breath. 8 g 0   amLODipine (NORVASC) 10 MG tablet Take 1 tablet (10 mg total) by mouth daily.     aspirin EC 81 MG tablet Take 81 mg by mouth daily.     baclofen (LIORESAL) 10 MG tablet TAKE 1 TABLET BY MOUTH 3  TIMES DAILY AS NEEDED FOR  MUSCLE SPASMS 270 tablet 0   Clobetasol Propionate 0.05 % shampoo Apply 1 application topically daily. As needed for scalp irritation from psoriasis 236 mL 3   conjugated estrogens (PREMARIN) vaginal cream Place 1 Applicatorful vaginally daily. 42.5 g 12   fluticasone (FLOVENT HFA) 110 MCG/ACT inhaler Inhale 2 puffs into the lungs 2 (two) times daily as needed (for shortness of breath or wheezing). 3 each 11   gabapentin (NEURONTIN) 100 MG capsule TAKE 1 CAPSULE BY MOUTH  TWICE DAILY AS NEEDED 180 capsule 2   hydrochlorothiazide (HYDRODIURIL) 12.5 MG tablet TAKE 1 TABLET BY MOUTH  DAILY 90 tablet 3   isosorbide mononitrate (IMDUR) 30 MG 24 hr tablet TAKE 1 TABLET BY MOUTH  DAILY 90 tablet 2   levothyroxine (SYNTHROID) 112 MCG tablet TAKE 1 TABLET BY MOUTH  DAILY BEFORE BREAKFAST 90 tablet 2   meclizine (ANTIVERT) 25 MG tablet Take 1 tablet (25 mg total) by mouth 3 (three) times daily as needed for dizziness. 30 tablet 0   meloxicam (MOBIC) 15 MG tablet Take 15 mg by mouth daily as needed for pain.     metoprolol tartrate (LOPRESSOR) 25 MG tablet Take 1 tablet (25 mg total) by mouth every evening. 90 tablet 3   metoprolol tartrate (LOPRESSOR) 50 MG tablet Take 1 tablet (50 mg total) by mouth 2 (two) times daily. Pt takes metoprolol 50 mg in AM and 75 mg in evening. 180 tablet 3   mometasone (ELOCON) 0.1 % cream Apply 1 Application topically  daily. To vaginal area as needed for redness/irritation. Can use twice daily for the first week 100 g 1   omeprazole (PRILOSEC) 40 MG capsule TAKE 1 CAPSULE BY MOUTH IN  THE MORNING AND MAY REPEAT  DOSE IN THE EVENING AS  NEEDED FOR ACID REFLUX /  INDIGESTION 180 capsule 3   ondansetron (ZOFRAN) 4 MG tablet Take 1 tablet (4 mg total) by mouth every 8 (eight) hours as needed for nausea or vomiting. 10 tablet 0   sennosides-docusate sodium (SENOKOT-S) 8.6-50 MG tablet Take 2 tablets by mouth daily. 30 tablet 1   tetrahydrozoline 0.05 % ophthalmic solution Place 2 drops into both eyes daily as needed (for dry eyes).     triamcinolone (NASACORT  ALLERGY 24HR) 55 MCG/ACT AERO nasal inhaler Place 2 sprays into the nose daily. 3 Inhaler 3   valsartan (DIOVAN) 80 MG tablet Take 1 tablet (80 mg total) by mouth daily. 30 tablet 0   No current facility-administered medications for this visit.    Allergies:   Patient has no known allergies.   Social History:  The patient  reports that she has never smoked. She has never used smokeless tobacco. She reports that she does not drink alcohol and does not use drugs.   Family History:  The patient's family history includes Heart attack in her father; Heart disease in an other family member; High blood pressure in her father; Prostate cancer in her father; Skin cancer in her mother; Stroke in her mother.  ROS:  Please see the history of present illness.    All other systems are reviewed and otherwise negative.   PHYSICAL EXAM:  VS:  There were no vitals taken for this visit. BMI: There is no height or weight on file to calculate BMI. Well nourished, well developed, in no acute distress, appears younger then her age 8: normocephalic, atraumatic Neck: no JVD, carotid bruits or masses Cardiac:  RRR; no significant murmurs, no rubs, or gallops Lungs:  CTA b/l, no wheezing, rhonchi or rales Abd: soft, nontender MS: no deformity or atrophy Ext: no edema Skin:  warm and dry, no rash Neuro:  No gross deficits appreciated Psych: euthymic mood, full affect  PPM site is stable, no tethering or discomfort   EKG:  Done today and reviewed by myself shows  SR/RBBB,  a paced beat  Device interrogation done today and reviewed by myself:  Battery and lead measurements are good She has had some NSVTs these 1-2seconds Fast AV episodes as well, longest 14 minutes   02/22/2022: TTE  1. Left ventricular ejection fraction, by estimation, is 55 to 60%. The  left ventricle has normal function. The left ventricle has no regional  wall motion abnormalities. There is mild concentric left ventricular  hypertrophy. Left ventricular diastolic  parameters are consistent with Grade I diastolic dysfunction (impaired  relaxation).   2. Right ventricular systolic function is normal. The right ventricular  size is normal. There is normal pulmonary artery systolic pressure. The  estimated right ventricular systolic pressure is 62.1 mmHg.   3. The mitral valve is normal in structure. Trivial mitral valve  regurgitation. No evidence of mitral stenosis.   4. The aortic valve is tricuspid. There is severe calcifcation of the  aortic valve. Aortic valve regurgitation is mild. Moderate aortic valve  stenosis. Aortic valve area, by VTI measures 1.06 cm. Aortic valve mean  gradient measures 21.0 mmHg.   5. The inferior vena cava is normal in size with greater than 50%  respiratory variability, suggesting right atrial pressure of 3 mmHg.    03/09/2021; TTE  1. Left ventricular ejection fraction, by estimation, is 60 to 65%. Left  ventricular ejection fraction by PLAX is 65 %. The left ventricle has  normal function. The left ventricle has no regional wall motion  abnormalities. There is mild concentric left  ventricular hypertrophy. Left ventricular diastolic parameters are  consistent with Grade I diastolic dysfunction (impaired relaxation).  Elevated left ventricular  end-diastolic pressure.   2. Right ventricular systolic function is normal. The right ventricular  size is normal.   3. The mitral valve is grossly normal. Trivial mitral valve  regurgitation. No evidence of mitral stenosis.   4. The aortic valve is tricuspid.  There is mild calcification of the  aortic valve. There is mild thickening of the aortic valve. Aortic valve  regurgitation is mild to moderate. Mild aortic valve stenosis. Aortic  regurgitation PHT measures 344 msec.  Aortic valve area, by VTI measures 1.28 cm. Aortic valve mean gradient  measures 16.0 mmHg. Aortic valve Vmax measures 2.64 m/s.   5. The inferior vena cava is normal in size with greater than 50%  respiratory variability, suggesting right atrial pressure of 3 mmHg.    02/16/2021: stress myoview The left ventricular ejection fraction is moderately decreased (30-44%). Nuclear stress EF: 40%. Global hypokinesis There was no ST segment deviation noted during stress. This is an intermediate risk study based upon reduction in ejection fraction of 40%. Consider echocardiogram to correlate ejection fraction. Previous echocardiogram in 2021 showed EF of 60 to 65% There are no significant perfusion defects at rest or stress. No ischemia.   Recent Labs: 01/16/2022: TSH 1.18 02/22/2022: BUN 19; Creatinine, Ser 1.02; Potassium 4.2; Sodium 138  01/16/2022: Cholesterol 145; HDL 47; LDL Cholesterol (Calc) 73; Total CHOL/HDL Ratio 3.1; Triglycerides 171   CrCl cannot be calculated (Patient's most recent lab result is older than the maximum 21 days allowed.).   Wt Readings from Last 3 Encounters:  03/15/22 185 lb 3.2 oz (84 kg)  02/02/22 188 lb 12.8 oz (85.6 kg)  01/16/22 189 lb 1.3 oz (85.8 kg)     Other studies reviewed: Additional studies/records reviewed today include: summarized above  ASSESSMENT AND PLAN:  PPM Intact function No programming changes made  HTN Fairly labile by report/chart hx Looks ok  today  SVT Suspect AVNRT Increasing burden, quite symptomatic Discussed management options They are hesitant to go higher on her BB previously making her very tired/fatigued   NSVT/PVCs Felt best managed conservatively on BB Wonder if this may be her fleeting dizziness?  Pre-op She would like to postpone her back surgery until more definative management/or her tachcardia is better controlled  We discussed need for a new EP MD Her daughter found good reviewed for Dr. Curt Bears and would like to see him.   Disposition: Given the increase in frequency/burden and symptoms, her pending surgery, with an available appt this afternoon for Dr. Curt Bears, will have her see him this afternoon.    Current medicines are reviewed at length with the patient today.  The patient did not have any concerns regarding medicines.  Venetia Night, PA-C 04/05/2022 2:49 PM     [address] [place] [address] [phone] (office)  [phone] (fax)

## 2022-04-07 ENCOUNTER — Encounter: Payer: Self-pay | Admitting: Medical-Surgical

## 2022-04-07 ENCOUNTER — Ambulatory Visit (INDEPENDENT_AMBULATORY_CARE_PROVIDER_SITE_OTHER): Payer: Medicare Other | Admitting: Medical-Surgical

## 2022-04-07 VITALS — BP 134/63 | HR 69 | Resp 20 | Ht 65.0 in | Wt 183.3 lb

## 2022-04-07 DIAGNOSIS — Z01818 Encounter for other preprocedural examination: Secondary | ICD-10-CM

## 2022-04-07 DIAGNOSIS — Z23 Encounter for immunization: Secondary | ICD-10-CM

## 2022-04-07 NOTE — Progress Notes (Signed)
   Established Patient Office Visit  Subjective   Patient ID: [name], female   DOB: Oct 03, 1934 Age: 87 y.o. MRN: [id number]   Chief Complaint  Patient presents with   Form Completion    SURGICAL CLEARANCE   HPI Very pleasant 87 year old female presenting today to discuss presurgical clearance. She will be have lumbar spine surgery to address bone spurring and completed a L4-5 laminectomy. The surgery date is not scheduled as the surgeon's office is waiting on clearance from her PCP, cardiology, and oncology. Reports she has a Cardiology visit scheduled for next week to discuss their clearance. She has had recent labs with no areas of concerns. Reports that the surgeon will also do necessary preoperative testing. Will have an EKG with cardiology.    Objective:    Vitals:   04/07/22 1048  BP: 134/63  Pulse: 69  Resp: 20  Height: '5\' 5"'$  (1.651 m)  Weight: 183 lb 4.8 oz (83.1 kg)  SpO2: 96%  BMI (Calculated): 30.5    Physical Exam Vitals and nursing note reviewed.  Constitutional:      General: She is not in acute distress.    Appearance: Normal appearance. She is obese. She is not ill-appearing.  HENT:     Head: Normocephalic and atraumatic.  Cardiovascular:     Rate and Rhythm: Normal rate and regular rhythm.     Pulses: Normal pulses.     Heart sounds: Normal heart sounds.  Pulmonary:     Effort: Pulmonary effort is normal. No respiratory distress.     Breath sounds: Normal breath sounds. No wheezing, rhonchi or rales.  Skin:    General: Skin is warm and dry.  Neurological:     Mental Status: She is alert and oriented to person, place, and time.  Psychiatric:        Mood and Affect: Mood normal.        Behavior: Behavior normal.        Thought Content: Thought content normal.        Judgment: Judgment normal.   No results found for this or any previous visit (from the past 24 hour(s)).     The ASCVD Risk score (Arnett DK, et al., 2019) failed to calculate for  the following reasons:   The 2019 ASCVD risk score is only valid for ages 78 to 15   The patient has a prior MI or stroke diagnosis   Assessment & Plan:   1. Preoperative clearance From a primary care standpoint, she is a low risk for surgery given her active lifestyle and excellent level of functioning. Her BP and chronic conditions are well controlled and her medication regimen is stable. Reviewed recent labs and specialist notes. Further clearance from specialists regarding her cardiac status and lymphoma to be completed by those providers. Form completed and will be faxed along with office notes as requested.   2. Need for influenza vaccination Declined today. She will be getting this at the same time as the COVID booster and will update [place].   Return in about 6 months (around 10/06/2022) for chronic disease follow up.  ___________________________________________ Clearnce Sorrel, DNP, APRN, FNP-BC [facility] [place]

## 2022-04-11 NOTE — Progress Notes (Signed)
Remote pacemaker transmission.   

## 2022-04-12 ENCOUNTER — Ambulatory Visit (INDEPENDENT_AMBULATORY_CARE_PROVIDER_SITE_OTHER): Payer: Medicare Other | Admitting: Cardiology

## 2022-04-12 ENCOUNTER — Ambulatory Visit (INDEPENDENT_AMBULATORY_CARE_PROVIDER_SITE_OTHER): Payer: Medicare Other

## 2022-04-12 ENCOUNTER — Encounter: Payer: Self-pay | Admitting: Cardiology

## 2022-04-12 ENCOUNTER — Encounter: Payer: Self-pay | Admitting: Physician Assistant

## 2022-04-12 ENCOUNTER — Ambulatory Visit: Payer: Medicare Other | Attending: Physician Assistant | Admitting: Physician Assistant

## 2022-04-12 VITALS — BP 138/70 | HR 74 | Ht 65.0 in | Wt 186.0 lb

## 2022-04-12 DIAGNOSIS — Z23 Encounter for immunization: Secondary | ICD-10-CM | POA: Diagnosis not present

## 2022-04-12 DIAGNOSIS — I471 Supraventricular tachycardia, unspecified: Secondary | ICD-10-CM

## 2022-04-12 DIAGNOSIS — Z950 Presence of cardiac pacemaker: Secondary | ICD-10-CM | POA: Diagnosis not present

## 2022-04-12 DIAGNOSIS — Z01818 Encounter for other preprocedural examination: Secondary | ICD-10-CM | POA: Diagnosis not present

## 2022-04-12 DIAGNOSIS — I4729 Other ventricular tachycardia: Secondary | ICD-10-CM | POA: Insufficient documentation

## 2022-04-12 LAB — CUP PACEART INCLINIC DEVICE CHECK
Battery Remaining Longevity: 158 mo
Battery Voltage: 3.03 V
Brady Statistic AP VP Percent: 0.07 %
Brady Statistic AP VS Percent: 14 %
Brady Statistic AS VP Percent: 0.07 %
Brady Statistic AS VS Percent: 85.86 %
Brady Statistic RA Percent Paced: 17.42 %
Brady Statistic RV Percent Paced: 0.14 %
Date Time Interrogation Session: 20231004143933
Implantable Lead Implant Date: 20210921
Implantable Lead Implant Date: 20210921
Implantable Lead Location: 753859
Implantable Lead Location: 753860
Implantable Lead Model: 3830
Implantable Lead Model: 5076
Implantable Pulse Generator Implant Date: 20210921
Lead Channel Impedance Value: 361 Ohm
Lead Channel Impedance Value: 399 Ohm
Lead Channel Impedance Value: 570 Ohm
Lead Channel Impedance Value: 608 Ohm
Lead Channel Pacing Threshold Amplitude: 0.75 V
Lead Channel Pacing Threshold Amplitude: 1 V
Lead Channel Pacing Threshold Pulse Width: 0.4 ms
Lead Channel Pacing Threshold Pulse Width: 0.4 ms
Lead Channel Sensing Intrinsic Amplitude: 16.625 mV
Lead Channel Sensing Intrinsic Amplitude: 17.625 mV
Lead Channel Sensing Intrinsic Amplitude: 3 mV
Lead Channel Sensing Intrinsic Amplitude: 3.5 mV
Lead Channel Setting Pacing Amplitude: 2 V
Lead Channel Setting Pacing Amplitude: 2.25 V
Lead Channel Setting Pacing Pulse Width: 0.4 ms
Lead Channel Setting Sensing Sensitivity: 0.9 mV

## 2022-04-12 MED ORDER — FLECAINIDE ACETATE 50 MG PO TABS: 50.0000 mg | ORAL_TABLET | Freq: Two times a day (BID) | ORAL | 3 refills | Status: DC

## 2022-04-12 NOTE — Progress Notes (Unsigned)
Enrolled for [employer] to mail a ZIO XT long term holter monitor to the patients address on file.  

## 2022-04-12 NOTE — Progress Notes (Signed)
Electrophysiology Office Note   Date:  04/12/2022   ID:  [name], [name] 08-19-1934, MRN [id number]  PCP:  Samuel Bouche, NP  Cardiologist:  Stanford Breed Primary Electrophysiologist:  Chevy Virgo Meredith Leeds, MD    Chief Complaint: SVT   History of Present Illness: [name] is a 86 y.o. female who is being seen today for the evaluation of SVT at the request of Samuel Bouche, NP. Presenting today for electrophysiology evaluation.  She has a history seen for hypertension, hyperlipidemia, hypothyroidism, symptomatic bradycardia post Medtronic pacemaker implanted 03/30/2020, SVT, PVCs/nonsustained VT.  She was last seen June 2023 with palpitations and SVT.  He has known labile blood pressures.  SVT was noted on device and suspected to be AVNRT.  She was offered an ablation but she declined.  Her metoprolol was adjusted at that time.  Unfortunately she is continued to have episodes of palpitations.  They are quite anxiety provoking, making her feel poorly with palpitations and shortness of breath as well as weakness.  She is short of breath most of the time, though it is certainly worse when she is having palpitations.  Today, she denies symptoms of palpitations, chest pain, shortness of breath, orthopnea, PND, lower extremity edema, claudication, dizziness, presyncope, syncope, bleeding, or neurologic sequela. The patient is tolerating medications without difficulties.    Past Medical History:  Diagnosis Date   Acquired trigger finger 08/05/2013   Arthritis    Asthma    Atrial fibrillation (HCC)    paroxysmal , addressed  with metoprolol    Callus of foot 09/03/2018   Chronic sinusitis    Complication of anesthesia    Dyspnea    sob on exertion, reports at her pre-op today 09-13-2018 that her cardiologist has planned for her to do a ECHO for evaluation    Family history of adverse reaction to anesthesia    sister is hard to wake up from Anesthesia   Follicular lymphoma ([place])    received  Chemotherapy (2 years ago)  and radiation (last 12/2017)   Hyperlipidemia    Hypertension    Hypothyroidism    Meniere's disease    PONV (postoperative nausea and vomiting)    Primary localized osteoarthritis of left knee 09/24/2018   Primary localized osteoarthritis of right knee 06/18/2018   Reflux    Sebaceous cyst 09/03/2018   Shingles 10/24/2019   Past Surgical History:  Procedure Laterality Date   ABDOMINAL HYSTERECTOMY     BONE MARROW BIOPSY  06/16/2014   at [place]     bilateral   CHOLECYSTECTOMY     KNEE ARTHROSCOPY     PACEMAKER IMPLANT N/A 03/30/2020   Procedure: PACEMAKER IMPLANT;  Surgeon: Thompson Grayer, MD;  Location: [facility];  Service: Cardiovascular;  Laterality: N/A;   PARTIAL KNEE ARTHROPLASTY Right 06/18/2018   Procedure: UNICOMPARTMENTAL KNEE;  Surgeon: Marchia Bond, MD;  Location: [facility];  Service: Orthopedics;  Laterality: Right;   PARTIAL KNEE ARTHROPLASTY Left 09/24/2018   Procedure: UNICOMPARTMENTAL KNEE;  Surgeon: Marchia Bond, MD;  Location: [facility];  Service: Orthopedics;  Laterality: Left;     Current Outpatient Medications  Medication Sig Dispense Refill   albuterol (VENTOLIN HFA) 108 (90 Base) MCG/ACT inhaler Inhale 2 puffs into the lungs every 6 (six) hours as needed for wheezing or shortness of breath. 8 g 0   amLODipine (NORVASC) 10 MG tablet Take 1 tablet (10 mg total) by mouth daily.     aspirin  EC 81 MG tablet Take 81 mg by mouth daily.     baclofen (LIORESAL) 10 MG tablet TAKE 1 TABLET BY MOUTH 3  TIMES DAILY AS NEEDED FOR  MUSCLE SPASMS 270 tablet 0   Clobetasol Propionate 0.05 % shampoo Apply 1 application topically daily. As needed for scalp irritation from psoriasis 236 mL 3   conjugated estrogens (PREMARIN) vaginal cream Place 1 Applicatorful vaginally daily. 42.5 g 12   flecainide (TAMBOCOR) 50 MG tablet Take 1 tablet (50 mg total) by mouth 2 (two) times daily. 180 each 3   fluticasone (FLOVENT  HFA) 110 MCG/ACT inhaler Inhale 2 puffs into the lungs 2 (two) times daily as needed (for shortness of breath or wheezing). 3 each 11   gabapentin (NEURONTIN) 100 MG capsule TAKE 1 CAPSULE BY MOUTH  TWICE DAILY AS NEEDED 180 capsule 2   hydrochlorothiazide (HYDRODIURIL) 12.5 MG tablet TAKE 1 TABLET BY MOUTH  DAILY 90 tablet 3   isosorbide mononitrate (IMDUR) 30 MG 24 hr tablet TAKE 1 TABLET BY MOUTH  DAILY 90 tablet 2   levothyroxine (SYNTHROID) 112 MCG tablet TAKE 1 TABLET BY MOUTH  DAILY BEFORE BREAKFAST 90 tablet 2   meclizine (ANTIVERT) 25 MG tablet Take 1 tablet (25 mg total) by mouth 3 (three) times daily as needed for dizziness. 30 tablet 0   meloxicam (MOBIC) 15 MG tablet Take 15 mg by mouth daily as needed for pain.     metoprolol tartrate (LOPRESSOR) 25 MG tablet Take 1 tablet (25 mg total) by mouth every evening. 90 tablet 3   metoprolol tartrate (LOPRESSOR) 50 MG tablet Take 1 tablet (50 mg total) by mouth 2 (two) times daily. Pt takes metoprolol 50 mg in AM and 75 mg in evening. 180 tablet 3   mometasone (ELOCON) 0.1 % cream Apply 1 Application topically daily. To vaginal area as needed for redness/irritation. Can use twice daily for the first week 100 g 1   omeprazole (PRILOSEC) 40 MG capsule TAKE 1 CAPSULE BY MOUTH IN  THE MORNING AND MAY REPEAT  DOSE IN THE EVENING AS  NEEDED FOR ACID REFLUX /  INDIGESTION 180 capsule 3   ondansetron (ZOFRAN) 4 MG tablet Take 1 tablet (4 mg total) by mouth every 8 (eight) hours as needed for nausea or vomiting. 10 tablet 0   sennosides-docusate sodium (SENOKOT-S) 8.6-50 MG tablet Take 2 tablets by mouth daily. 30 tablet 1   tetrahydrozoline 0.05 % ophthalmic solution Place 2 drops into both eyes daily as needed (for dry eyes).     triamcinolone (NASACORT ALLERGY 24HR) 55 MCG/ACT AERO nasal inhaler Place 2 sprays into the nose daily. 3 Inhaler 3   valsartan (DIOVAN) 80 MG tablet Take 1 tablet (80 mg total) by mouth daily. 30 tablet 0   No current  facility-administered medications for this visit.    Allergies:   Patient has no known allergies.   Social History:  The patient  reports that she has never smoked. She has never used smokeless tobacco. She reports that she does not drink alcohol and does not use drugs.   Family History:  The patient's family history includes Heart attack in her father; Heart disease in an other family member; High blood pressure in her father; Prostate cancer in her father; Skin cancer in her mother; Stroke in her mother.    ROS:  Please see the history of present illness.   Otherwise, review of systems is positive for none.   All other systems are reviewed  and negative.    PHYSICAL EXAM: VS:  BP 138/70   Pulse 74   Ht _0  (1.651 m)   Wt 186 lb (84.4 kg)   SpO2 98%   BMI 30.95 kg/m  , BMI Body mass index is 30.95 kg/m. GEN: Well nourished, well developed, in no acute distress  HEENT: normal  Neck: no JVD, carotid bruits, or masses Cardiac: RRR; no murmurs, rubs, or gallops,no edema  Respiratory:  clear to auscultation bilaterally, normal work of breathing GI: soft, nontender, nondistended, + BS MS: no deformity or atrophy  Skin: warm and dry, device pocket is well healed Neuro:  Strength and sensation are intact Psych: euthymic mood, full affect  EKG:  EKG is ordered today. Personal review of the ekg ordered shows sinus rhythm, T wave inversions  Device interrogation is reviewed today in detail.  See PaceArt for details.   Recent Labs: 01/16/2022: TSH 1.18 02/22/2022: BUN 19; Creatinine, Ser 1.02; Potassium 4.2; Sodium 138    Lipid Panel     Component Value Date/Time   CHOL 145 01/16/2022 0000   TRIG 171 (H) 01/16/2022 0000   HDL 47 (L) 01/16/2022 0000   CHOLHDL 3.1 01/16/2022 0000   LDLCALC 73 01/16/2022 0000     Wt Readings from Last 3 Encounters:  04/12/22 186 lb (84.4 kg)  04/12/22 186 lb (84.4 kg)  04/07/22 183 lb 4.8 oz (83.1 kg)      Other studies  Reviewed: Additional studies/ records that were reviewed today include: TTE 02/22/22  Review of the above records today demonstrates:   1. Left ventricular ejection fraction, by estimation, is 55 to 60%. The  left ventricle has normal function. The left ventricle has no regional  wall motion abnormalities. There is mild concentric left ventricular  hypertrophy. Left ventricular diastolic  parameters are consistent with Grade I diastolic dysfunction (impaired  relaxation).   2. Right ventricular systolic function is normal. The right ventricular  size is normal. There is normal pulmonary artery systolic pressure. The  estimated right ventricular systolic pressure is 70.1 mmHg.   3. The mitral valve is normal in structure. Trivial mitral valve  regurgitation. No evidence of mitral stenosis.   4. The aortic valve is tricuspid. There is severe calcifcation of the  aortic valve. Aortic valve regurgitation is mild. Moderate aortic valve  stenosis. Aortic valve area, by VTI measures 1.06 cm. Aortic valve mean  gradient measures 21.0 mmHg.   5. The inferior vena cava is normal in size with greater than 50%  respiratory variability, suggesting right atrial pressure of 3 mmHg.    ASSESSMENT AND PLAN:  1.  SVT: Suspected due to AVNRT.  He has had an increasing burden and has been quite symptomatic.  She would benefit from a rhythm control strategy.  She could potentially tolerate ablation, but with her PVCs, medication management would be a potentially reasonable initial option.  We Phil Corti start her on flecainide 50 mg twice daily.  2.  Symptomatic bradycardia: Status post Medtronic dual-chamber pacemaker.  Device functioning appropriately.  She is having some episodes of near syncope.  It is unclear to me as to the cause of her near syncope.  There is nothing on her pacemaker that would indicate this.  We Jamare Vanatta have her wear a 2-week monitor.  3.  Hypertension: Currently well controlled  4.   PVCs/nonsustained VT: Currently on a beta-blocker.  Thought to be best to be managed conservatively.  Start flecainide as above.    Current  medicines are reviewed at length with the patient today.   The patient does not have concerns regarding her medicines.  The following changes were made today: Start flecainide  Labs/ tests ordered today include:  Orders Placed This Encounter  Procedures   LONG TERM MONITOR (3-14 DAYS)   CUP PACEART INCLINIC DEVICE CHECK     Disposition:   FU with Jaydee Conran 3 months  Signed, Stevens Magwood Meredith Leeds, MD  04/12/2022 3:06 PM     [address] [place] [address] [phone] (office) [phone] (fax)

## 2022-04-12 NOTE — Patient Instructions (Signed)
Medication Instructions:  Your physician has recommended you make the following change in your medication:  1) START taking flecainide 50 mg twice daily *If you need a refill on your cardiac medications before your next appointment, please call your pharmacy*  Testing/Procedures: Your physician has recommended that you wear an event monitor. Event monitors are medical devices that record the heart's electrical activity. Doctors most often [place] these monitors to diagnose arrhythmias. Arrhythmias are problems with the speed or rhythm of the heartbeat. The monitor is a small, portable device. You can wear one while you do your normal daily activities. This is usually used to diagnose what is causing palpitations/syncope (passing out).  Follow-Up: At [facility], you and your health needs are our priority.  As part of our continuing mission to provide you with exceptional heart care, we have created designated Provider Care Teams.  These Care Teams include your primary Cardiologist (physician) and Advanced Practice Providers (APPs -  Physician Assistants and Nurse Practitioners) who all work together to provide you with the care you need, when you need it.  Follow up in 6 months with Jillian Standard, [name] Other Instructions ZIO XT- Long Term Monitor Instructions  Your physician has requested you wear a ZIO patch monitor for 14 days.  This is a single patch monitor. [employer] supplies one patch monitor per enrollment. Additional stickers are not available. Please do not apply patch if you will be having a Nuclear Stress Test,  Echocardiogram, Cardiac CT, MRI, or Chest Xray during the period you would be wearing the  monitor. The patch cannot be worn during these tests. You cannot remove and re-apply the  ZIO XT patch monitor.  Your ZIO patch monitor will be mailed 3 day USPS to your address on file. It may take 3-5 days  to receive your monitor after you have been enrolled.  Once you have  received your monitor, please review the enclosed instructions. Your monitor  has already been registered assigning a specific monitor serial # to you.  Billing and Patient Assistance Program Information  We have supplied [employer] with any of your insurance information on file for billing purposes. [employer] offers a sliding scale Patient Assistance Program for patients that do not have  insurance, or whose insurance does not completely cover the cost of the ZIO monitor.  You must apply for the Patient Assistance Program to qualify for this discounted rate.  To apply, please call [employer] at [phone], select option 4, select option 2, ask to apply for  Patient Assistance Program. [name] will ask your household income, and how many people  are in your household. They will quote your out-of-pocket cost based on that information.  [employer] will also be able to set up a 44-month interest-free payment plan if needed.  Applying the monitor   Shave hair from upper left chest.  Hold abrader disc by orange tab. Rub abrader in 40 strokes over the upper left chest as  indicated in your monitor instructions.  Clean area with 4 enclosed alcohol pads. Let dry.  Apply patch as indicated in monitor instructions. Patch will be placed under collarbone on left  side of chest with arrow pointing upward.  Rub patch adhesive wings for 2 minutes. Remove white label marked "1". Remove the white  label marked "2". Rub patch adhesive wings for 2 additional minutes.  While looking in a mirror, press and release button in center of patch. A small green light will  flash 3-4 times. This will  be your only indicator that the monitor has been turned on.  Do not shower for the first 24 hours. You may shower after the first 24 hours.  Press the button if you feel a symptom. You will hear a small click. Record Date, Time and  Symptom in the Patient Logbook.  When you are ready to remove the patch, follow instructions on  the last 2 pages of Patient  Logbook. Stick patch monitor onto the last page of Patient Logbook.  Place Patient Logbook in the blue and white box. Use locking tab on box and tape box closed  securely. The blue and white box has prepaid postage on it. Please place it in the mailbox as  soon as possible. Your physician should have your test results approximately 7 days after the  monitor has been mailed back to [facility].  Call [facility] at [phone] if you have questions regarding  your ZIO XT patch monitor. Call them immediately if you see an orange light blinking on your  monitor.  If your monitor falls off in less than 4 days, contact our Monitor department at [phone].  If your monitor becomes loose or falls off after 4 days call [employer] at [phone] for  suggestions on securing your monitor   Important Information About Sugar

## 2022-04-12 NOTE — Patient Instructions (Signed)
Medication Instructions:   Your physician recommends that you continue on your current medications as directed. Please refer to the Current Medication list given to you today.  *If you need a refill on your cardiac medications before your next appointment, please call your pharmacy*   Lab Work: [place]    If you have labs (blood work) drawn today and your tests are completely normal, you will receive your results only by: [place] (if you have MyChart) OR A paper copy in the mail If you have any lab test that is abnormal or we need to change your treatment, we will call you to review the results.   Testing/Procedures: NONE ORDERED  TODAY    Follow-Up: At [facility], you and your health needs are our priority.  As part of our continuing mission to provide you with exceptional heart care, we have created designated Provider Care Teams.  These Care Teams include your primary Cardiologist (physician) and Advanced Practice Providers (APPs -  Physician Assistants and Nurse Practitioners) who all work together to provide you with the care you need, when you need it.  We recommend signing up for the patient portal called "MyChart".  Sign up information is provided on this After Visit Summary.  MyChart is used to connect with patients for Virtual Visits (Telemedicine).  Patients are able to view lab/test results, encounter notes, upcoming appointments, etc.  Non-urgent messages can be sent to your provider as well.   To learn more about what you can do with MyChart, go to [email or web address].    Your next appointment:  AS SCHEDULED WITH DR CAMNITZ   The format for your next appointment:   In Person  Provider:   Allegra Lai, MD    Other Instructions    Important Information About Sugar

## 2022-04-15 DIAGNOSIS — I471 Supraventricular tachycardia, unspecified: Secondary | ICD-10-CM | POA: Diagnosis not present

## 2022-04-17 ENCOUNTER — Other Ambulatory Visit: Payer: Self-pay | Admitting: Orthopedic Surgery

## 2022-04-17 ENCOUNTER — Other Ambulatory Visit (HOSPITAL_COMMUNITY): Payer: Self-pay | Admitting: Orthopedic Surgery

## 2022-04-17 DIAGNOSIS — M25511 Pain in right shoulder: Secondary | ICD-10-CM | POA: Diagnosis not present

## 2022-04-17 DIAGNOSIS — M75100 Unspecified rotator cuff tear or rupture of unspecified shoulder, not specified as traumatic: Secondary | ICD-10-CM

## 2022-04-20 DIAGNOSIS — R590 Localized enlarged lymph nodes: Secondary | ICD-10-CM | POA: Diagnosis not present

## 2022-04-20 DIAGNOSIS — R5383 Other fatigue: Secondary | ICD-10-CM | POA: Diagnosis not present

## 2022-04-20 DIAGNOSIS — C8230 Follicular lymphoma grade IIIa, unspecified site: Secondary | ICD-10-CM | POA: Diagnosis not present

## 2022-04-20 DIAGNOSIS — I10 Essential (primary) hypertension: Secondary | ICD-10-CM | POA: Diagnosis not present

## 2022-04-20 DIAGNOSIS — I4891 Unspecified atrial fibrillation: Secondary | ICD-10-CM | POA: Diagnosis not present

## 2022-04-20 DIAGNOSIS — Z923 Personal history of irradiation: Secondary | ICD-10-CM | POA: Diagnosis not present

## 2022-04-20 DIAGNOSIS — R1310 Dysphagia, unspecified: Secondary | ICD-10-CM | POA: Diagnosis not present

## 2022-04-20 DIAGNOSIS — I480 Paroxysmal atrial fibrillation: Secondary | ICD-10-CM | POA: Diagnosis not present

## 2022-04-20 DIAGNOSIS — C8200 Follicular lymphoma grade I, unspecified site: Secondary | ICD-10-CM | POA: Diagnosis not present

## 2022-04-25 ENCOUNTER — Ambulatory Visit: Payer: Medicare Other | Admitting: Medical-Surgical

## 2022-04-25 DIAGNOSIS — C8200 Follicular lymphoma grade I, unspecified site: Secondary | ICD-10-CM | POA: Diagnosis not present

## 2022-04-25 DIAGNOSIS — R590 Localized enlarged lymph nodes: Secondary | ICD-10-CM | POA: Diagnosis not present

## 2022-04-25 DIAGNOSIS — C8202 Follicular lymphoma grade I, intrathoracic lymph nodes: Secondary | ICD-10-CM | POA: Diagnosis not present

## 2022-04-25 DIAGNOSIS — C8208 Follicular lymphoma grade I, lymph nodes of multiple sites: Secondary | ICD-10-CM | POA: Diagnosis not present

## 2022-04-25 DIAGNOSIS — R222 Localized swelling, mass and lump, trunk: Secondary | ICD-10-CM | POA: Diagnosis not present

## 2022-05-05 ENCOUNTER — Telehealth: Payer: Self-pay | Admitting: Cardiology

## 2022-05-05 NOTE — Telephone Encounter (Signed)
*  STAT* If patient is at the pharmacy, call can be transferred to refill team.   1. Which medications need to be refilled? (please list name of each medication and dose if known) omeprazole (PRILOSEC) 40 MG capsule  2. Which pharmacy/location (including street and city if local pharmacy) is medication to be sent to? [facility] ([place], [place]  3. Do they need a 30 day or 90 day supply? [place]

## 2022-05-08 MED ORDER — OMEPRAZOLE 40 MG PO CPDR: DELAYED_RELEASE_CAPSULE | ORAL | 3 refills | Status: DC

## 2022-05-08 NOTE — Telephone Encounter (Signed)
Refill complete 

## 2022-05-10 ENCOUNTER — Telehealth: Payer: Self-pay | Admitting: Cardiology

## 2022-05-10 NOTE — Telephone Encounter (Signed)
Spoke with the patient who wore heart monitor from 10/7-10/21. Advised that we do not have results yet but I will check on them and we will call once received and Dr. Baird Kay reviews.

## 2022-05-10 NOTE — Telephone Encounter (Signed)
Patient is calling requesting her heart monitor results. Please advise.  

## 2022-05-12 DIAGNOSIS — I4719 Other supraventricular tachycardia: Secondary | ICD-10-CM | POA: Diagnosis not present

## 2022-05-26 ENCOUNTER — Other Ambulatory Visit: Payer: Self-pay | Admitting: *Deleted

## 2022-05-26 MED ORDER — FLECAINIDE ACETATE 100 MG PO TABS: 100.0000 mg | ORAL_TABLET | Freq: Two times a day (BID) | ORAL | 6 refills | Status: DC

## 2022-06-02 ENCOUNTER — Encounter: Payer: Self-pay | Admitting: Cardiology

## 2022-06-02 ENCOUNTER — Ambulatory Visit (HOSPITAL_COMMUNITY)
Admission: RE | Admit: 2022-06-02 | Discharge: 2022-06-02 | Disposition: A | Discharge status: Home / Self Care | Payer: Medicare Other | Source: Ambulatory Visit | Attending: Orthopedic Surgery | Admitting: Orthopedic Surgery

## 2022-06-02 DIAGNOSIS — M75100 Unspecified rotator cuff tear or rupture of unspecified shoulder, not specified as traumatic: Secondary | ICD-10-CM | POA: Insufficient documentation

## 2022-06-02 DIAGNOSIS — M25511 Pain in right shoulder: Secondary | ICD-10-CM | POA: Diagnosis not present

## 2022-06-02 NOTE — Progress Notes (Signed)
Patient here today at [facility] for MRI Right shoulder wo contrast. Patient has Medtronic device. CLE sent. Orders received for DOO 80. Will re-program once scan is completed. Patient mentions to this RN that she has recently been more fatigued, light-headed, nausea and sick on her stomach since increasing her flecainide as prescribed by her physician. This RN notified Renee-Cards PA of new symptoms. Patient is followed by Dr. Curt Bears

## 2022-06-07 DIAGNOSIS — M542 Cervicalgia: Secondary | ICD-10-CM | POA: Diagnosis not present

## 2022-06-07 DIAGNOSIS — M25511 Pain in right shoulder: Secondary | ICD-10-CM | POA: Diagnosis not present

## 2022-06-09 ENCOUNTER — Ambulatory Visit: Payer: Medicare Other

## 2022-06-14 ENCOUNTER — Other Ambulatory Visit: Payer: Self-pay | Admitting: General Practice

## 2022-06-20 ENCOUNTER — Encounter: Payer: Self-pay | Admitting: Cardiology

## 2022-06-20 NOTE — Telephone Encounter (Signed)
Spoke to dtr, aware awaiting MD recommendation. Informed that I would let her know by tomorrow morning. She is agreeable

## 2022-06-24 ENCOUNTER — Other Ambulatory Visit: Payer: Self-pay | Admitting: Cardiology

## 2022-06-24 DIAGNOSIS — Z950 Presence of cardiac pacemaker: Secondary | ICD-10-CM

## 2022-06-27 NOTE — Telephone Encounter (Signed)
Spoke to pt and dtr. Will see what tomorrows transmission shows before determining starting Amiodarone. Pt will stop Flecainide tonight, as she was still taking in once daily Patient verbalized understanding and agreeable to plan.

## 2022-06-28 ENCOUNTER — Ambulatory Visit (INDEPENDENT_AMBULATORY_CARE_PROVIDER_SITE_OTHER): Payer: Medicare Other

## 2022-06-28 DIAGNOSIS — I441 Atrioventricular block, second degree: Secondary | ICD-10-CM

## 2022-06-28 LAB — CUP PACEART REMOTE DEVICE CHECK
Battery Remaining Longevity: 155 mo
Battery Voltage: 3.03 V
Brady Statistic AP VP Percent: 9.75 %
Brady Statistic AP VS Percent: 21.84 %
Brady Statistic AS VP Percent: 15.83 %
Brady Statistic AS VS Percent: 52.58 %
Brady Statistic RA Percent Paced: 36.64 %
Brady Statistic RV Percent Paced: 25.58 %
Date Time Interrogation Session: 20231220053859
Implantable Lead Connection Status: 753985
Implantable Lead Connection Status: 753985
Implantable Lead Implant Date: 20210921
Implantable Lead Implant Date: 20210921
Implantable Lead Location: 753859
Implantable Lead Location: 753860
Implantable Lead Model: 3830
Implantable Lead Model: 5076
Implantable Pulse Generator Implant Date: 20210921
Lead Channel Impedance Value: 304 Ohm
Lead Channel Impedance Value: 361 Ohm
Lead Channel Impedance Value: 513 Ohm
Lead Channel Impedance Value: 513 Ohm
Lead Channel Pacing Threshold Amplitude: 0.875 V
Lead Channel Pacing Threshold Amplitude: 0.875 V
Lead Channel Pacing Threshold Pulse Width: 0.4 ms
Lead Channel Pacing Threshold Pulse Width: 0.4 ms
Lead Channel Sensing Intrinsic Amplitude: 13.5 mV
Lead Channel Sensing Intrinsic Amplitude: 13.5 mV
Lead Channel Sensing Intrinsic Amplitude: 2.625 mV
Lead Channel Sensing Intrinsic Amplitude: 2.625 mV
Lead Channel Setting Pacing Amplitude: 1.75 V
Lead Channel Setting Pacing Amplitude: 2 V
Lead Channel Setting Pacing Pulse Width: 0.4 ms
Lead Channel Setting Sensing Sensitivity: 0.9 mV
Zone Setting Status: 755011

## 2022-06-29 NOTE — Telephone Encounter (Addendum)
Spoke to pt. Transmission reviewed. Aware ok to hold off on starting Amiodarone for now, per Dr. Curt Bears.  She will let us know if she begins to experience  issues with her HRs and would like to readdress antiarrythmic.

## 2022-07-18 ENCOUNTER — Telehealth: Payer: Self-pay | Admitting: *Deleted

## 2022-07-18 ENCOUNTER — Telehealth: Payer: Self-pay

## 2022-07-18 ENCOUNTER — Telehealth: Payer: Self-pay | Admitting: Cardiology

## 2022-07-18 NOTE — Telephone Encounter (Signed)
  Pt is calling to follow up for her clearance sent in 03/15/22 and saw Renee on 04/12/22. She said Dr. Towanda Malkin office has not receive the clearance yet, she asked if someone can call her to confirm that her clearance has been sent

## 2022-07-18 NOTE — Telephone Encounter (Signed)
I s/w the pt and she has been scheduled for tele pre op appt 07/20/22 @ 2:20. Med rec and consent are done. Pt also asked me if I could help her get an appt with Dr. Curt Bears in April. She states Sherri told her last week that someone will call her this week to make appt, but she states no one has called her. I have made appt and pt thanked me for the help.

## 2022-07-18 NOTE — Telephone Encounter (Signed)
I called the pt right back but got her vm. Pt was calling to change her tele pre op appt.

## 2022-07-18 NOTE — Telephone Encounter (Signed)
   Name: [name]  DOB: 19-Aug-1934  MRN: [id number]  Primary Cardiologist: Kirk Ruths, MD  Chart reviewed as part of pre-operative protocol coverage. Because of [name]'s past medical history and time since last visit, she will require a follow-up telephone visit in order to better assess preoperative cardiovascular risk.  Pre-op covering staff: - Please schedule appointment and call patient to inform them. If patient already had an upcoming appointment within acceptable timeframe, please add "pre-op clearance" to the appointment notes so provider is aware. - Please contact requesting surgeon's office via preferred method (i.e, phone, fax) to inform them of need for appointment prior to surgery.  No medications are indicated as needing held.    Elgie Collard, PA-C  07/18/2022, 1:03 PM

## 2022-07-18 NOTE — Telephone Encounter (Signed)
I will ask the pre op APP to review the chart to see if the pt has been cleared. Pt saw Tommye Standard, [facility] 04/12/22 and Dr. Curt Bears 04/12/22.

## 2022-07-18 NOTE — Telephone Encounter (Signed)
I s/w the pt and she has been scheduled for tele pre op appt 07/20/22 @ 2:20. Med rec and consent are done. Pt also asked me if I could help her get an appt with Dr. Curt Bears in April. She states Sherri told her last week that someone will call her this week to make appt, but she states no one has called her. I have made appt and pt thanked me for the help.      Patient Consent for Virtual Visit        [name] has provided verbal consent on 07/18/2022 for a virtual visit (video or telephone).   CONSENT FOR VIRTUAL VISIT FOR:  [name]  By participating in this virtual visit I agree to the following:  I hereby voluntarily request, consent and authorize [facility] and its employed or contracted physicians, physician assistants, nurse practitioners or other licensed health care professionals (the Practitioner), to provide me with telemedicine health care services (the "Services") as deemed necessary by the treating Practitioner. I acknowledge and consent to receive the Services by the Practitioner via telemedicine. I understand that the telemedicine visit will involve communicating with the Practitioner through live audiovisual communication technology and the disclosure of certain medical information by electronic transmission. I acknowledge that I have been given the opportunity to request an in-person assessment or other available alternative prior to the telemedicine visit and am voluntarily participating in the telemedicine visit.  I understand that I have the right to withhold or withdraw my consent to the use of telemedicine in the course of my care at any time, without affecting my right to future care or treatment, and that the Practitioner or I may terminate the telemedicine visit at any time. I understand that I have the right to inspect all information obtained and/or recorded in the course of the telemedicine visit and may receive copies of available information for a  reasonable fee.  I understand that some of the potential risks of receiving the Services via telemedicine include:  Delay or interruption in medical evaluation due to technological equipment failure or disruption; Information transmitted may not be sufficient (e.g. poor resolution of images) to allow for appropriate medical decision making by the Practitioner; and/or  In rare instances, security protocols could fail, causing a breach of personal health information.  Furthermore, I acknowledge that it is my responsibility to provide information about my medical history, conditions and care that is complete and accurate to the best of my ability. I acknowledge that Practitioner's advice, recommendations, and/or decision may be based on factors not within their control, such as incomplete or inaccurate data provided by me or distortions of diagnostic images or specimens that may result from electronic transmissions. I understand that the practice of medicine is not an exact science and that Practitioner makes no warranties or guarantees regarding treatment outcomes. I acknowledge that a copy of this consent can be made available to me via my patient portal ([facility]), or I can request a printed copy by calling the office of [facility].    I understand that my insurance will be billed for this visit.   I have read or had this consent read to me. I understand the contents of this consent, which adequately explains the benefits and risks of the Services being provided via telemedicine.  I have been provided ample opportunity to ask questions regarding this consent and the Services and have had my questions answered to my satisfaction.  I give my informed consent for the services to be provided through the use of telemedicine in my medical care

## 2022-07-18 NOTE — Telephone Encounter (Signed)
   Pre-operative Risk Assessment    Patient Name: [name]  DOB: Jun 10, 1935 MRN: [id number]       Request for Surgical Clearance    Procedure:   L4-5 LAMI PSF TLIF  Date of Surgery:  Clearance TBD                                 Surgeon:  DR Patrice Paradise Surgeon's Group or Practice Name:  [facility] Phone number:  [phone] Fax number:  [phone]   Type of Clearance Requested:   - Medical is patient cleared for surgery at this time from your stand point?    Type of Anesthesia:  General    Additional requests/questions:  Please advise surgeon/provider what medications should be held. Please fax a copy of cardiac clearance  to the surgeon's office.  Signed, Jeanmarie Plant Hailie Searight  CCMA 07/18/2022, 1:58 PM

## 2022-07-18 NOTE — Telephone Encounter (Signed)
ERROR

## 2022-07-18 NOTE — Telephone Encounter (Signed)
I s/w the pt and her daughter [name]. Pt needed to reschedule her tele pre op appt to 07/24/22 @ 10:20 so that her daughter can be on the call as well.

## 2022-07-20 ENCOUNTER — Telehealth: Payer: Medicare Other

## 2022-07-21 DIAGNOSIS — M25562 Pain in left knee: Secondary | ICD-10-CM | POA: Diagnosis not present

## 2022-07-21 DIAGNOSIS — M25511 Pain in right shoulder: Secondary | ICD-10-CM | POA: Diagnosis not present

## 2022-07-21 DIAGNOSIS — M25561 Pain in right knee: Secondary | ICD-10-CM | POA: Diagnosis not present

## 2022-07-21 NOTE — Progress Notes (Signed)
Remote pacemaker transmission.   

## 2022-07-23 NOTE — Progress Notes (Unsigned)
Virtual Visit via Telephone Note   Because of [name]'s co-morbid illnesses, she is at least at moderate risk for complications without adequate follow up.  This format is felt to be most appropriate for this patient at this time.  The patient did not have access to video technology/had technical difficulties with video requiring transitioning to audio format only (telephone).  All issues noted in this document were discussed and addressed.  No physical exam could be performed with this format.  Please refer to the patient's chart for her consent to telehealth for [facility].  Evaluation Performed:  Preoperative cardiovascular risk assessment _____________   Date:  07/23/2022   Patient ID:  Jillian, Greer October 30, 1934, MRN [id number] Patient Location:  Home Provider location:   Office  Primary Care Provider:  Samuel Bouche, NP Primary Cardiologist:  Kirk Ruths, MD  Chief Complaint / Patient Profile   88 y.o. y/o female with a h/o HTN, HLD, hypothyroidism, symptomatic bradycardia s/p Medtr[employer] PPM 2021, PCV/nonsustained VT who is pending L4-5 LAMI PSF TLIF  and presents today for telephonic preoperative cardiovascular risk assessment.  History of Present Illness    [name] is a 88 y.o. female who presents via audio/video conferencing for a telehealth visit today.  Pt was last seen in cardiology clinic on 04/12/2022 by Dr. Curt Bears.  At that time [name] was doing well with no complaints of chest pain, shortness of breath and was tolerating medications without difficulties.  The patient is now pending procedure as outlined above. Since her last visit, she reports that she is doing the same with no chest pain but has mild shortness of breath that is her baseline.  She is active within her home and climbs a flight of stairs daily with no difficulty.  She does have some limitations with her arthritic back that requires the upcoming procedure.    Past Medical  History    Past Medical History:  Diagnosis Date   Acquired trigger finger 08/05/2013   Arthritis    Asthma    Atrial fibrillation (HCC)    paroxysmal , addressed  with metoprolol    Callus of foot 09/03/2018   Chronic sinusitis    Complication of anesthesia    Dyspnea    sob on exertion, reports at her pre-op today 09-13-2018 that her cardiologist has planned for her to do a ECHO for evaluation    Family history of adverse reaction to anesthesia    sister is hard to wake up from Anesthesia   Follicular lymphoma ([place])    received Chemotherapy (2 years ago)  and radiation (last 12/2017)   Hyperlipidemia    Hypertension    Hypothyroidism    Meniere's disease    PONV (postoperative nausea and vomiting)    Primary localized osteoarthritis of left knee 09/24/2018   Primary localized osteoarthritis of right knee 06/18/2018   Reflux    Sebaceous cyst 09/03/2018   Shingles 10/24/2019   Past Surgical History:  Procedure Laterality Date   ABDOMINAL HYSTERECTOMY     BONE MARROW BIOPSY  06/16/2014   at [place]     bilateral   CHOLECYSTECTOMY     KNEE ARTHROSCOPY     PACEMAKER IMPLANT N/A 03/30/2020   Procedure: PACEMAKER IMPLANT;  Surgeon: Thompson Grayer, MD;  Location: [place] [facility];  Service: Cardiovascular;  Laterality: N/A;   PARTIAL KNEE ARTHROPLASTY Right 06/18/2018   Procedure: UNICOMPARTMENTAL KNEE;  Surgeon:  Marchia Bond, MD;  Location: [facility];  Service: Orthopedics;  Laterality: Right;   PARTIAL KNEE ARTHROPLASTY Left 09/24/2018   Procedure: UNICOMPARTMENTAL KNEE;  Surgeon: Marchia Bond, MD;  Location: [facility];  Service: Orthopedics;  Laterality: Left;    Allergies  No Known Allergies  Home Medications    Prior to Admission medications   Medication Sig Start Date End Date Taking? Authorizing Provider  albuterol (VENTOLIN HFA) 108 (90 Base) MCG/ACT inhaler Inhale 2 puffs into the lungs every 6 (six) hours as needed for wheezing or shortness  of breath. 11/23/20   Brunetta Jeans, PA-C  amLODipine (NORVASC) 10 MG tablet Take 1 tablet (10 mg total) by mouth daily. 11/25/21   Lelon Perla, MD  aspirin EC 81 MG tablet Take 81 mg by mouth daily.    [provider]  baclofen (LIORESAL) 10 MG tablet TAKE 1 TABLET BY MOUTH 3  TIMES DAILY AS NEEDED FOR  MUSCLE SPASMS 02/08/21   [name], DO  Clobetasol Propionate 0.05 % shampoo Apply 1 application topically daily. As needed for scalp irritation from psoriasis 03/23/21   [name], DO  conjugated estrogens (PREMARIN) vaginal cream Place 1 Applicatorful vaginally daily. 01/16/22   Samuel Bouche, NP  fluticasone (FLOVENT HFA) 110 MCG/ACT inhaler Inhale 2 puffs into the lungs 2 (two) times daily as needed (for shortness of breath or wheezing). 03/23/21   [name], DO  gabapentin (NEURONTIN) 100 MG capsule TAKE 1 CAPSULE BY MOUTH  TWICE DAILY AS NEEDED 06/28/20   [name], DO  hydrochlorothiazide (HYDRODIURIL) 12.5 MG tablet TAKE 1 TABLET BY MOUTH  DAILY 10/27/21   Lelon Perla, MD  isosorbide mononitrate (IMDUR) 30 MG 24 hr tablet TAKE 1 TABLET BY MOUTH  DAILY 01/03/22   Lelon Perla, MD  levothyroxine (SYNTHROID) 112 MCG tablet TAKE 1 TABLET BY MOUTH  DAILY BEFORE BREAKFAST 01/03/22   Lelon Perla, MD  meclizine (ANTIVERT) 25 MG tablet Take 1 tablet (25 mg total) by mouth 3 (three) times daily as needed for dizziness. 10/10/18   [name], DO  meloxicam (MOBIC) 15 MG tablet Take 15 mg by mouth daily as needed for pain.    [provider]  metoprolol tartrate (LOPRESSOR) 25 MG tablet Take 1 tablet (25 mg total) by mouth every evening. 01/04/22   Allred, Jeneen Rinks, MD  metoprolol tartrate (LOPRESSOR) 50 MG tablet Take 1 tablet (50 mg total) by mouth 2 (two) times daily. Pt takes metoprolol 50 mg in AM and 75 mg in evening. 01/04/22   Allred, Jeneen Rinks, MD  mometasone (ELOCON) 0.1 % cream Apply 1 Application topically daily. To vaginal area  as needed for redness/irritation. Can use twice daily for the first week 03/24/22   Samuel Bouche, NP  omeprazole (PRILOSEC) 40 MG capsule TAKE 1 CAPSULE BY MOUTH IN  THE MORNING AND MAY REPEAT  DOSE IN THE EVENING AS  NEEDED FOR ACID REFLUX /  INDIGESTION 05/08/22   Lelon Perla, MD  ondansetron (ZOFRAN) 4 MG tablet Take 1 tablet (4 mg total) by mouth every 8 (eight) hours as needed for nausea or vomiting. 09/03/18   [name], DO  sennosides-docusate sodium (SENOKOT-S) 8.6-50 MG tablet Take 2 tablets by mouth daily. 06/18/18   Marchia Bond, MD  tetrahydrozoline 0.05 % ophthalmic solution Place 2 drops into both eyes daily as needed (for dry eyes).    [provider]  triamcinolone (NASACORT ALLERGY 24HR) 55 MCG/ACT AERO nasal inhaler Place 2 sprays into the  nose daily. 09/03/18   [name], DO  valsartan (DIOVAN) 80 MG tablet TAKE 1 TABLET BY MOUTH DAILY 06/14/22   Constance Haw, MD    Physical Exam    Vital Signs:  [name] does not have vital signs available for review today.  Given telephonic nature of communication, physical exam is limited. AAOx3. NAD. Normal affect.  Speech and respirations are unlabored.  Accessory Clinical Findings    None  Assessment & Plan    1.  Preoperative Cardiovascular Risk Assessment:  The patient affirms she has been doing well without any new cardiac symptoms. They are able to achieve 5 METS without cardiac limitations. Therefore, based on ACC/AHA guidelines, the patient would be at acceptable risk for the planned procedure without further cardiovascular testing. The patient was advised that if she develops new symptoms prior to surgery to contact our office to arrange for a follow-up visit, and she verbalized understanding.   [name] perioperative risk of a major cardiac event is 6.6% according to the Revised Cardiac Risk Index (RCRI).  Therefore, she is at high risk for perioperative complications.   Her  functional capacity is fair at 5.07 METs according to the [facility] Activity Status Index (DASI). Recommendations: According to ACC/AHA guidelines, no further cardiovascular testing needed.  The patient may proceed to surgery at acceptable risk.   Antiplatelet and/or Anticoagulation Recommendations: Aspirin can be held for 7 days prior to her surgery.  Please resume Aspirin post operatively when it is felt to be safe from a bleeding standpoint.    The patient was advised that if she develops new symptoms prior to surgery to contact our office to arrange for a follow-up visit, and she verbalized understanding.   A copy of this note will be routed to requesting surgeon.  Time:   Today, I have spent 6 minutes with the patient with telehealth technology discussing medical history, symptoms, and management plan.     Mable Fill, Marissa Nestle, NP  07/23/2022, 8:57 PM

## 2022-07-24 ENCOUNTER — Ambulatory Visit: Payer: Medicare Other | Attending: Internal Medicine | Admitting: Nurse Practitioner

## 2022-07-24 DIAGNOSIS — Z01810 Encounter for preprocedural cardiovascular examination: Secondary | ICD-10-CM | POA: Diagnosis not present

## 2022-08-01 DIAGNOSIS — E669 Obesity, unspecified: Secondary | ICD-10-CM | POA: Diagnosis not present

## 2022-08-01 DIAGNOSIS — M545 Low back pain, unspecified: Secondary | ICD-10-CM | POA: Diagnosis not present

## 2022-08-01 DIAGNOSIS — M3501 Sicca syndrome with keratoconjunctivitis: Secondary | ICD-10-CM | POA: Diagnosis not present

## 2022-08-01 DIAGNOSIS — M1991 Primary osteoarthritis, unspecified site: Secondary | ICD-10-CM | POA: Diagnosis not present

## 2022-08-01 DIAGNOSIS — Z6831 Body mass index (BMI) 31.0-31.9, adult: Secondary | ICD-10-CM | POA: Diagnosis not present

## 2022-08-01 DIAGNOSIS — R768 Other specified abnormal immunological findings in serum: Secondary | ICD-10-CM | POA: Diagnosis not present

## 2022-08-08 ENCOUNTER — Other Ambulatory Visit: Payer: Self-pay | Admitting: Cardiology

## 2022-08-08 DIAGNOSIS — R072 Precordial pain: Secondary | ICD-10-CM

## 2022-08-09 ENCOUNTER — Telehealth: Payer: Self-pay

## 2022-08-09 NOTE — Telephone Encounter (Signed)
Patient called in reference to a Surgical Clearance form that was dropped off 03/15/2022. Was waiting for her visit with Cardiology to clear her before sending last office notes from [facility].   I have faxed the last office notes and surgical clearance form today.

## 2022-08-10 NOTE — Telephone Encounter (Signed)
Joy with [facility] called and left a vm that she wanted to confirm if clearance note from Ambrose Pancoast, NP was clearing for both the gen card provider Dr. Stanford Breed and the EP provider Dr. Curt Bears.pt was seeing Dr. Rayann Heman in the past for EP though Dr. Rayann Heman is not longer with the practice and now see's Dr. Curt Bears for EP. I did assure Joy the pre op clearance is for both providers. Joy confirmed do they need to send a clearance request for each doctor. I stated no, just one request is suffice as they are both of our providers. Joy thanked me for the help.   Just as Juluis Rainier will send to Caryl Asp of our conversation from today.

## 2022-08-25 DIAGNOSIS — M48062 Spinal stenosis, lumbar region with neurogenic claudication: Secondary | ICD-10-CM | POA: Insufficient documentation

## 2022-09-11 NOTE — Progress Notes (Signed)
HPI:  Follow-up AS/AI, paroxysmal atrial fibrillation and history of syncope (bradycardia mediated status post pacemaker).  Patient admitted September 2021 with syncopal episode.  Found to be bradycardic and ultimately underwent pacemaker. Carotid Doppler September 2021 showed 1 to 39% bilateral stenosis.  Nuclear study August 2022 showed ejection fraction 40% with normal perfusion. 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  Echocardiogram August 2023 showed normal LV function, mild left ventricular hypertrophy, grade 1 diastolic dysfunction, moderate aortic stenosis with mean gradient 21 mmHg, mild aortic insufficiency.  Monitor November 2023 showed sinus rhythm with PVCs.  Since last seen, she has some dyspnea on exertion which is unchanged.  No exertional chest pain.  She has occasional dizzy spells where she transiently feels like she might pass out and resolves quickly.  No frank syncope.  Current Outpatient Medications  Medication Sig Dispense Refill   albuterol (VENTOLIN HFA) 108 (90 Base) MCG/ACT inhaler Inhale 2 puffs into the lungs every 6 (six) hours as needed for wheezing or shortness of breath. 8 g 0   amLODipine (NORVASC) 10 MG tablet TAKE 1 TABLET BY MOUTH DAILY 90 tablet 3   aspirin EC 81 MG tablet Take 81 mg by mouth daily.     baclofen (LIORESAL) 10 MG tablet TAKE 1 TABLET BY MOUTH 3  TIMES DAILY AS NEEDED FOR  MUSCLE SPASMS 270 tablet 0   Clobetasol Propionate 0.05 % shampoo Apply 1 application topically daily. As needed for scalp irritation from psoriasis 236 mL 3   conjugated estrogens (PREMARIN) vaginal cream Place 1 Applicatorful vaginally daily. 42.5 g 12   fluticasone (FLOVENT HFA) 110 MCG/ACT inhaler Inhale 2 puffs into the lungs 2 (two) times daily as needed (for shortness of breath or wheezing). 3 each 11   gabapentin (NEURONTIN) 100 MG capsule TAKE 1 CAPSULE BY MOUTH  TWICE DAILY AS NEEDED 180 capsule 2   hydrochlorothiazide (HYDRODIURIL)  12.5 MG tablet TAKE 1 TABLET BY MOUTH DAILY 90 tablet 3   isosorbide mononitrate (IMDUR) 30 MG 24 hr tablet TAKE 1 TABLET BY MOUTH DAILY 90 tablet 3   levothyroxine (SYNTHROID) 112 MCG tablet TAKE 1 TABLET BY MOUTH DAILY  BEFORE BREAKFAST 90 tablet 3   meclizine (ANTIVERT) 25 MG tablet Take 1 tablet (25 mg total) by mouth 3 (three) times daily as needed for dizziness. 30 tablet 0   meloxicam (MOBIC) 15 MG tablet Take 15 mg by mouth daily as needed for pain.     metoprolol tartrate (LOPRESSOR) 25 MG tablet Take 1 tablet (25 mg total) by mouth every evening. 90 tablet 3   metoprolol tartrate (LOPRESSOR) 50 MG tablet Take 1 tablet (50 mg total) by mouth 2 (two) times daily. Pt takes metoprolol 50 mg in AM and 75 mg in evening. 180 tablet 3   mometasone (ELOCON) 0.1 % cream Apply 1 Application topically daily. To vaginal area as needed for redness/irritation. Can use twice daily for the first week 100 g 1   omeprazole (PRILOSEC) 40 MG capsule TAKE 1 CAPSULE BY MOUTH IN  THE MORNING AND MAY REPEAT  DOSE IN THE EVENING AS  NEEDED FOR ACID REFLUX /  INDIGESTION 180 capsule 3   ondansetron (ZOFRAN) 4 MG tablet Take 1 tablet (4 mg total) by mouth every 8 (eight) hours as needed for nausea or vomiting. 10 tablet 0   sennosides-docusate sodium (SENOKOT-S) 8.6-50 MG tablet Take 2 tablets by mouth daily. 30 tablet 1   tetrahydrozoline 0.05 % ophthalmic  solution Place 2 drops into both eyes daily as needed (for dry eyes).     triamcinolone (NASACORT ALLERGY 24HR) 55 MCG/ACT AERO nasal inhaler Place 2 sprays into the nose daily. 3 Inhaler 3   valsartan (DIOVAN) 80 MG tablet TAKE 1 TABLET BY MOUTH DAILY 90 tablet 3   No current facility-administered medications for this visit.     Past Medical History:  Diagnosis Date   Acquired trigger finger 08/05/2013   Arthritis    Asthma    Atrial fibrillation (HCC)    paroxysmal , addressed  with metoprolol    Callus of foot 09/03/2018   Chronic sinusitis     Complication of anesthesia    Dyspnea    sob on exertion, reports at her pre-op today 09-13-2018 that her cardiologist has planned for her to do a ECHO for evaluation    Family history of adverse reaction to anesthesia    sister is hard to wake up from Anesthesia   Follicular lymphoma ([place])    received Chemotherapy (2 years ago)  and radiation (last 12/2017)   Hyperlipidemia    Hypertension    Hypothyroidism    Meniere's disease    PONV (postoperative nausea and vomiting)    Primary localized osteoarthritis of left knee 09/24/2018   Primary localized osteoarthritis of right knee 06/18/2018   Reflux    Sebaceous cyst 09/03/2018   Shingles 10/24/2019    Past Surgical History:  Procedure Laterality Date   ABDOMINAL HYSTERECTOMY     BONE MARROW BIOPSY  06/16/2014   at [place]     bilateral   CHOLECYSTECTOMY     KNEE ARTHROSCOPY     PACEMAKER IMPLANT N/A 03/30/2020   Procedure: PACEMAKER IMPLANT;  Surgeon: Thompson Grayer, MD;  Location: [place] [facility];  Service: Cardiovascular;  Laterality: N/A;   PARTIAL KNEE ARTHROPLASTY Right 06/18/2018   Procedure: UNICOMPARTMENTAL KNEE;  Surgeon: Marchia Bond, MD;  Location: [facility];  Service: Orthopedics;  Laterality: Right;   PARTIAL KNEE ARTHROPLASTY Left 09/24/2018   Procedure: UNICOMPARTMENTAL KNEE;  Surgeon: Marchia Bond, MD;  Location: [facility];  Service: Orthopedics;  Laterality: Left;    Social History   Socioeconomic History   Marital status: Widowed    Spouse name: Not on file   Number of children: 1   Years of education: 54   Highest education level: 12th grade  Occupational History   Occupation: Theatre manager    Comment: retired  Tobacco Use   Smoking status: Never   Smokeless tobacco: Never  Vaping Use   Vaping Use: Never used  Substance and Sexual Activity   Alcohol use: No   Drug use: No   Sexual activity: Not Currently    Partners: Male  Other Topics Concern   Not on file  Social  History Narrative   No exercise. 2 cups of coffee a day    Social Determinants of Health   Financial Resource Strain: Low Risk  (03/10/2019)   Overall Financial Resource Strain (CARDIA)    Difficulty of Paying Living Expenses: Not hard at all  Food Insecurity: No Food Insecurity (03/10/2019)   Hunger Vital Sign    Worried About Running Out of Food in the Last Year: Never true    Ran Out of Food in the Last Year: Never true  Transportation Needs: No Transportation Needs (03/10/2019)   PRAPARE - Hydrologist (Medical): No    Lack of Transportation (Non-Medical):  No  Physical Activity: Inactive (03/10/2019)   Exercise Vital Sign    Days of Exercise per Week: 0 days    Minutes of Exercise per Session: 0 min  Stress: No Stress Concern Present (03/10/2019)   [place]    Feeling of Stress : Not at all  Social Connections: Socially Isolated (03/10/2019)   Social Connection and Isolation Panel [NHANES]    Frequency of Communication with Friends and Family: Once a week    Frequency of Social Gatherings with Friends and Family: Once a week    Attends Religious Services: 1 to 4 times per year    Active Member of [employer] or Organizations: No    Attends Archivist Meetings: Never    Marital Status: Widowed  Intimate Partner Violence: Not At Risk (03/10/2019)   Humiliation, Afraid, Rape, and Kick questionnaire    Fear of Current or Ex-Partner: No    Emotionally Abused: No    Physically Abused: No    Sexually Abused: No    Family History  Problem Relation Age of Onset   Skin cancer Mother    Stroke Mother    Prostate cancer Father    High blood pressure Father    Heart attack Father    Heart disease Other     ROS: no fevers or chills, productive cough, hemoptysis, dysphasia, odynophagia, melena, hematochezia, dysuria, hematuria, rash, seizure activity, orthopnea, PND, pedal edema,  claudication. Remaining systems are negative.  Physical Exam: Well-developed well-nourished in no acute distress.  Skin is warm and dry.  HEENT is normal.  Neck is supple.  Chest is clear to auscultation with normal expansion.  Cardiovascular exam is regular rate and rhythm.  3/6 systolic murmur left sternal border. Abdominal exam nontender or distended. No masses palpated. Extremities show no edema. neuro grossly intact   A/P  1 paroxysmal atrial fibrillation-this was previously noted in the remote past by Dr. Rayann Heman on device check.  However he felt it was short lived (less than 30 seconds) and did not require anticoagulation.  She has had no recurrences on device checks or monitor.    2 aortic stenosis/aortic insufficiency-aortic stenosis is moderate on most recent echocardiogram.  She denies increased dyspnea or chest pain.  She is having occasional dizzy spells.  Question related aortic stenosis.  Will repeat echocardiogram and I will see her back in 4 to 6 weeks to review.  3 hypertension-blood pressure controlled. However she is having worsening palpitations suggestive of SVT.  I will increase metoprolol to 100 mg twice daily.  Discontinue isosorbide and decrease amlodipine to 5 mg daily.  Follow blood pressure and adjust medications as needed.  4 PVCs-continue beta-blocker.  5 prior pacemaker-monitored by electrophysiology.  6 history of AVNRT-symptoms are worsening.  Increase metoprolol as outlined above.  She did not tolerate flecainide.  She is scheduled to see Dr. Curt Bears in April and can have further discussions concerning ablation versus antiarrhythmic therapy.  Kirk Ruths, MD

## 2022-09-17 ENCOUNTER — Other Ambulatory Visit: Payer: Self-pay | Admitting: Cardiology

## 2022-09-18 ENCOUNTER — Ambulatory Visit: Payer: Medicare Other | Attending: Cardiology | Admitting: Cardiology

## 2022-09-18 ENCOUNTER — Encounter: Payer: Self-pay | Admitting: Cardiology

## 2022-09-18 VITALS — BP 122/60 | HR 86 | Ht 65.0 in | Wt 185.8 lb

## 2022-09-18 DIAGNOSIS — R072 Precordial pain: Secondary | ICD-10-CM

## 2022-09-18 DIAGNOSIS — I480 Paroxysmal atrial fibrillation: Secondary | ICD-10-CM | POA: Diagnosis not present

## 2022-09-18 DIAGNOSIS — I10 Essential (primary) hypertension: Secondary | ICD-10-CM | POA: Insufficient documentation

## 2022-09-18 DIAGNOSIS — Z950 Presence of cardiac pacemaker: Secondary | ICD-10-CM

## 2022-09-18 DIAGNOSIS — I350 Nonrheumatic aortic (valve) stenosis: Secondary | ICD-10-CM | POA: Insufficient documentation

## 2022-09-18 MED ORDER — METOPROLOL TARTRATE 100 MG PO TABS: 100.0000 mg | ORAL_TABLET | Freq: Two times a day (BID) | ORAL | 3 refills | Status: DC

## 2022-09-18 MED ORDER — AMLODIPINE BESYLATE 5 MG PO TABS: 5.0000 mg | ORAL_TABLET | Freq: Every day | ORAL | 3 refills | Status: DC

## 2022-09-18 NOTE — Patient Instructions (Signed)
STOP ISOSORBIDE  REDUCE AMLODIPINE TO 5 MG ONCE DAILY= 1/2 OF THE 10 MG TABLET ONCE DAILY  INCREASE METOPROLOL TO 100 MG TWICE DAILY= 2 OF THE 50 MG TABLETS TWICE DAILY   Testing/Procedures:  Your physician has requested that you have an echocardiogram. Echocardiography is a painless test that uses sound waves to create images of your heart. It provides your doctor with information about the size and shape of your heart and how well your heart's chambers and valves are working. This procedure takes approximately one hour. There are no restrictions for this procedure. Please do NOT wear cologne, perfume, aftershave, or lotions (deodorant is allowed). Please arrive 15 minutes prior to your appointment time. [place], you and your health needs are our priority.  As part of our continuing mission to provide you with exceptional heart care, we have created designated Provider Care Teams.  These Care Teams include your primary Cardiologist (physician) and Advanced Practice Providers (APPs -  Physician Assistants and Nurse Practitioners) who all work together to provide you with the care you need, when you need it.  We recommend signing up for the patient portal called "MyChart".  Sign up information is provided on this After Visit Summary.  MyChart is used to connect with patients for Virtual Visits (Telemedicine).  Patients are able to view lab/test results, encounter notes, upcoming appointments, etc.  Non-urgent messages can be sent to your provider as well.   To learn more about what you can do with MyChart, go to [email or web address].    Your next appointment:   4-6 week(s)  Provider:   Kirk Ruths, MD

## 2022-09-20 DIAGNOSIS — L853 Xerosis cutis: Secondary | ICD-10-CM | POA: Diagnosis not present

## 2022-09-20 DIAGNOSIS — L400 Psoriasis vulgaris: Secondary | ICD-10-CM | POA: Diagnosis not present

## 2022-09-20 DIAGNOSIS — L249 Irritant contact dermatitis, unspecified cause: Secondary | ICD-10-CM | POA: Diagnosis not present

## 2022-09-27 ENCOUNTER — Ambulatory Visit (INDEPENDENT_AMBULATORY_CARE_PROVIDER_SITE_OTHER): Payer: Medicare Other

## 2022-09-27 DIAGNOSIS — I441 Atrioventricular block, second degree: Secondary | ICD-10-CM | POA: Diagnosis not present

## 2022-09-28 LAB — CUP PACEART REMOTE DEVICE CHECK
Battery Remaining Longevity: 152 mo
Battery Voltage: 3.03 V
Brady Statistic AP VP Percent: 0.08 %
Brady Statistic AP VS Percent: 11.98 %
Brady Statistic AS VP Percent: 0.26 %
Brady Statistic AS VS Percent: 87.69 %
Brady Statistic RA Percent Paced: 14.64 %
Brady Statistic RV Percent Paced: 0.34 %
Date Time Interrogation Session: 20240320010620
Implantable Lead Connection Status: 753985
Implantable Lead Connection Status: 753985
Implantable Lead Implant Date: 20210921
Implantable Lead Implant Date: 20210921
Implantable Lead Location: 753859
Implantable Lead Location: 753860
Implantable Lead Model: 3830
Implantable Lead Model: 5076
Implantable Pulse Generator Implant Date: 20210921
Lead Channel Impedance Value: 323 Ohm
Lead Channel Impedance Value: 380 Ohm
Lead Channel Impedance Value: 437 Ohm
Lead Channel Impedance Value: 551 Ohm
Lead Channel Pacing Threshold Amplitude: 0.75 V
Lead Channel Pacing Threshold Amplitude: 0.875 V
Lead Channel Pacing Threshold Pulse Width: 0.4 ms
Lead Channel Pacing Threshold Pulse Width: 0.4 ms
Lead Channel Sensing Intrinsic Amplitude: 18.125 mV
Lead Channel Sensing Intrinsic Amplitude: 18.125 mV
Lead Channel Sensing Intrinsic Amplitude: 2.25 mV
Lead Channel Sensing Intrinsic Amplitude: 2.25 mV
Lead Channel Setting Pacing Amplitude: 1.5 V
Lead Channel Setting Pacing Amplitude: 2 V
Lead Channel Setting Pacing Pulse Width: 0.4 ms
Lead Channel Setting Sensing Sensitivity: 0.9 mV
Zone Setting Status: 755011

## 2022-10-04 ENCOUNTER — Ambulatory Visit: Payer: Medicare Other | Admitting: Medical-Surgical

## 2022-10-11 ENCOUNTER — Ambulatory Visit: Payer: Medicare Other | Attending: Cardiology | Admitting: Cardiology

## 2022-10-11 VITALS — BP 148/82 | HR 68 | Ht 65.0 in | Wt 183.0 lb

## 2022-10-11 DIAGNOSIS — I471 Supraventricular tachycardia, unspecified: Secondary | ICD-10-CM

## 2022-10-11 DIAGNOSIS — D6869 Other thrombophilia: Secondary | ICD-10-CM

## 2022-10-11 DIAGNOSIS — I493 Ventricular premature depolarization: Secondary | ICD-10-CM

## 2022-10-11 NOTE — Progress Notes (Signed)
Electrophysiology Office Note   Date:  10/11/2022   ID:  Nikera, Furer June 29, 1935, MRN [id number]  PCP:  Samuel Bouche, NP  Cardiologist:  Stanford Breed Primary Electrophysiologist:  Zebbie Ace Meredith Leeds, MD    Chief Complaint: SVT   History of Present Illness: [name] is a 88 y.o. female who is being seen today for the evaluation of SVT at the request of Samuel Bouche, NP. Presenting today for electrophysiology evaluation.  He has a history of significant hypertension, hyperlipidemia, hypothyroidism, symptomatic bradycardia post pacemaker implant 03/30/2020, SVT, PVCs/nonsustained VT.  She had SVT noted on device interrogation suspected due to AVNRT.  She was offered ablation but declined.  She is now on flecainide.  Today, denies symptoms of palpitations, chest pain, shortness of breath, orthopnea, PND, lower extremity edema, claudication, dizziness, presyncope, syncope, bleeding, or neurologic sequela. The patient is tolerating medications without difficulties.  Since being seen she has done well.  She has had no chest pain or shortness of breath.  She was previously having issues with near syncopal episodes.  She saw her primary cardiologist and had medications adjusted including a higher dose of metoprolol.  She is felt much improved since then.    Past Medical History:  Diagnosis Date   Acquired trigger finger 08/05/2013   Arthritis    Asthma    Atrial fibrillation    paroxysmal , addressed  with metoprolol    Callus of foot 09/03/2018   Chronic sinusitis    Complication of anesthesia    Dyspnea    sob on exertion, reports at her pre-op today 09-13-2018 that her cardiologist has planned for her to do a ECHO for evaluation    Family history of adverse reaction to anesthesia    sister is hard to wake up from Anesthesia   Follicular lymphoma    received Chemotherapy (2 years ago)  and radiation (last 12/2017)   Hyperlipidemia    Hypertension    Hypothyroidism    Meniere's  disease    PONV (postoperative nausea and vomiting)    Primary localized osteoarthritis of left knee 09/24/2018   Primary localized osteoarthritis of right knee 06/18/2018   Reflux    Sebaceous cyst 09/03/2018   Shingles 10/24/2019   Past Surgical History:  Procedure Laterality Date   ABDOMINAL HYSTERECTOMY     BONE MARROW BIOPSY  06/16/2014   at [facility]     bilateral   CHOLECYSTECTOMY     KNEE ARTHROSCOPY     PACEMAKER IMPLANT N/A 03/30/2020   Procedure: PACEMAKER IMPLANT;  Surgeon: Thompson Grayer, MD;  Location: [facility];  Service: Cardiovascular;  Laterality: N/A;   PARTIAL KNEE ARTHROPLASTY Right 06/18/2018   Procedure: UNICOMPARTMENTAL KNEE;  Surgeon: Marchia Bond, MD;  Location: [facility];  Service: Orthopedics;  Laterality: Right;   PARTIAL KNEE ARTHROPLASTY Left 09/24/2018   Procedure: UNICOMPARTMENTAL KNEE;  Surgeon: Marchia Bond, MD;  Location: [facility];  Service: Orthopedics;  Laterality: Left;     Current Outpatient Medications  Medication Sig Dispense Refill   albuterol (VENTOLIN HFA) 108 (90 Base) MCG/ACT inhaler Inhale 2 puffs into the lungs every 6 (six) hours as needed for wheezing or shortness of breath. 8 g 0   amLODipine (NORVASC) 5 MG tablet Take 1 tablet (5 mg total) by mouth daily. 90 tablet 3   aspirin EC 81 MG tablet Take 81 mg by mouth daily.     baclofen (LIORESAL) 10 MG tablet TAKE 1  TABLET BY MOUTH 3  TIMES DAILY AS NEEDED FOR  MUSCLE SPASMS 270 tablet 0   Clobetasol Propionate 0.05 % shampoo Apply 1 application topically daily. As needed for scalp irritation from psoriasis 236 mL 3   conjugated estrogens (PREMARIN) vaginal cream Place 1 Applicatorful vaginally daily. 42.5 g 12   fluticasone (FLOVENT HFA) 110 MCG/ACT inhaler Inhale 2 puffs into the lungs 2 (two) times daily as needed (for shortness of breath or wheezing). 3 each 11   gabapentin (NEURONTIN) 100 MG capsule TAKE 1 CAPSULE BY MOUTH  TWICE DAILY AS NEEDED 180  capsule 2   hydrochlorothiazide (HYDRODIURIL) 12.5 MG tablet TAKE 1 TABLET BY MOUTH DAILY 90 tablet 3   levothyroxine (SYNTHROID) 112 MCG tablet TAKE 1 TABLET BY MOUTH DAILY  BEFORE BREAKFAST 90 tablet 3   meclizine (ANTIVERT) 25 MG tablet Take 1 tablet (25 mg total) by mouth 3 (three) times daily as needed for dizziness. 30 tablet 0   meloxicam (MOBIC) 15 MG tablet Take 15 mg by mouth daily as needed for pain.     metoprolol tartrate (LOPRESSOR) 100 MG tablet Take 100 mg by mouth 2 (two) times daily.     mometasone (ELOCON) 0.1 % cream Apply 1 Application topically daily. To vaginal area as needed for redness/irritation. Can use twice daily for the first week 100 g 1   omeprazole (PRILOSEC) 40 MG capsule TAKE 1 CAPSULE BY MOUTH IN  THE MORNING AND MAY REPEAT  DOSE IN THE EVENING AS  NEEDED FOR ACID REFLUX /  INDIGESTION 180 capsule 3   ondansetron (ZOFRAN) 4 MG tablet Take 1 tablet (4 mg total) by mouth every 8 (eight) hours as needed for nausea or vomiting. 10 tablet 0   sennosides-docusate sodium (SENOKOT-S) 8.6-50 MG tablet Take 2 tablets by mouth daily. 30 tablet 1   tetrahydrozoline 0.05 % ophthalmic solution Place 2 drops into both eyes daily as needed (for dry eyes).     triamcinolone (NASACORT ALLERGY 24HR) 55 MCG/ACT AERO nasal inhaler Place 2 sprays into the nose daily. 3 Inhaler 3   valsartan (DIOVAN) 80 MG tablet TAKE 1 TABLET BY MOUTH DAILY 90 tablet 3   No current facility-administered medications for this visit.    Allergies:   Patient has no known allergies.   Social History:  The patient  reports that she has never smoked. She has never used smokeless tobacco. She reports that she does not drink alcohol and does not use drugs.   Family History:  The patient's family history includes Heart attack in her father; Heart disease in an other family member; High blood pressure in her father; Prostate cancer in her father; Skin cancer in her mother; Stroke in her mother.   ROS:   Please see the history of present illness.   Otherwise, review of systems is positive for none.   All other systems are reviewed and negative.   PHYSICAL EXAM: VS:  BP (!) 148/82 (BP Location: Left Arm, Cuff Size: Normal)   Pulse 68   Ht 5\' 5"  (1.651 m)   Wt 183 lb (83 kg)   SpO2 96%   BMI 30.45 kg/m  , BMI Body mass index is 30.45 kg/m. GEN: Well nourished, well developed, in no acute distress  HEENT: normal  Neck: no JVD, carotid bruits, or masses Cardiac: RRR; 2/6 systolic murmur at the base, no gallops,no edema  Respiratory:  clear to auscultation bilaterally, normal work of breathing GI: soft, nontender, nondistended, + BS MS: no deformity  or atrophy  Skin: warm and dry Neuro:  Strength and sensation are intact Psych: euthymic mood, full affect  EKG:  EKG is ordered today. Personal review of the ekg ordered shows sinus rhythm, rate 66   Recent Labs: 01/16/2022: TSH 1.18 02/22/2022: BUN 19; Creatinine, Ser 1.02; Potassium 4.2; Sodium 138    Lipid Panel     Component Value Date/Time   CHOL 145 01/16/2022 0000   TRIG 171 (H) 01/16/2022 0000   HDL 47 (L) 01/16/2022 0000   CHOLHDL 3.1 01/16/2022 0000   LDLCALC 73 01/16/2022 0000     Wt Readings from Last 3 Encounters:  10/11/22 183 lb (83 kg)  09/18/22 185 lb 12.8 oz (84.3 kg)  04/12/22 186 lb (84.4 kg)      Other studies Reviewed: Additional studies/ records that were reviewed today include: TTE 02/22/22  Review of the above records today demonstrates:   1. Left ventricular ejection fraction, by estimation, is 55 to 60%. The  left ventricle has normal function. The left ventricle has no regional  wall motion abnormalities. There is mild concentric left ventricular  hypertrophy. Left ventricular diastolic  parameters are consistent with Grade I diastolic dysfunction (impaired  relaxation).   2. Right ventricular systolic function is normal. The right ventricular  size is normal. There is normal pulmonary artery  systolic pressure. The  estimated right ventricular systolic pressure is 0000000 mmHg.   3. The mitral valve is normal in structure. Trivial mitral valve  regurgitation. No evidence of mitral stenosis.   4. The aortic valve is tricuspid. There is severe calcifcation of the  aortic valve. Aortic valve regurgitation is mild. Moderate aortic valve  stenosis. Aortic valve area, by VTI measures 1.06 cm. Aortic valve mean  gradient measures 21.0 mmHg.   5. The inferior vena cava is normal in size with greater than 50%  respiratory variability, suggesting right atrial pressure of 3 mmHg.    Cardiac monitor 05/15/2019. Predominant rhythm was sinus rhythm Less than 1% supraventricular ectopy 8.6% ventricular ectopy Triggered episodes associated with both sinus rhythm and sinus rhythm with PVCs  ASSESSMENT AND PLAN:  1.  SVT: Suspected due to AVNRT.  She was having an increased burden and is quite symptomatic.  She also has PVCs.  She was started on flecainide but did not tolerate this.  She was having episodes of near syncope.  Her medications were adjusted, metoprolol was increased, and she is felt much better since then.  Marnette Perkins continue with current management.  2.  Symptomatic bradycardia: Status post Medtronic dual-chamber pacemaker.  Device functioning appropriately.  Is having episodes of near syncope though no cause seen on pacemaker or monitor.  3.  Hypertension: Elevated today.  Usually well-controlled.  Coming down on recheck.  No changes.  4.  PVCs/nonsustained VT: Currently on beta-blocker.  Felt best managed conservatively.  8.6% noted on cardiac monitor.  5.  High risk medication monitoring: Currently on flecainide.  QRS remains stable.    Current medicines are reviewed at length with the patient today.   The patient does not have concerns regarding her medicines.  The following changes were made today: None  Labs/ tests ordered today include:  Orders Placed This Encounter   Procedures   EKG 12-Lead     Disposition:   FU 6 months  Signed, Voshon Petro Meredith Leeds, MD  10/11/2022 11:02 AM     [facility] [address] [place] [address] [phone] (office) [phone] (fax)

## 2022-10-11 NOTE — Patient Instructions (Signed)
Medication Instructions:  Your physician recommends that you continue on your current medications as directed. Please refer to the Current Medication list given to you today.  *If you need a refill on your cardiac medications before your next appointment, please call your pharmacy*  Lab Work: None ordered  Testing/Procedures: None ordered  Follow-Up: At [facility], you and your health needs are our priority.  As part of our continuing mission to provide you with exceptional heart care, we have created designated Provider Care Teams.  These Care Teams include your primary Cardiologist (physician) and Advanced Practice Providers (APPs -  Physician Assistants and Nurse Practitioners) who all work together to provide you with the care you need, when you need it.  Your next appointment:   6 month(s)  The format for your next appointment:   In Person  Provider:   You may see Will Meredith Leeds, MD or one of the following Advanced Practice Providers on your designated Care Team:   Tommye Standard, [place] Legrand Como "Sidney" [place], PA-C [place], [place]

## 2022-10-12 ENCOUNTER — Ambulatory Visit (INDEPENDENT_AMBULATORY_CARE_PROVIDER_SITE_OTHER): Payer: Medicare Other | Admitting: Medical-Surgical

## 2022-10-12 ENCOUNTER — Encounter: Payer: Self-pay | Admitting: Medical-Surgical

## 2022-10-12 VITALS — BP 158/77 | HR 72 | Resp 20 | Ht 65.0 in | Wt 185.1 lb

## 2022-10-12 DIAGNOSIS — M1991 Primary osteoarthritis, unspecified site: Secondary | ICD-10-CM | POA: Insufficient documentation

## 2022-10-12 DIAGNOSIS — I10 Essential (primary) hypertension: Secondary | ICD-10-CM

## 2022-10-12 DIAGNOSIS — M791 Myalgia, unspecified site: Secondary | ICD-10-CM

## 2022-10-12 DIAGNOSIS — E559 Vitamin D deficiency, unspecified: Secondary | ICD-10-CM

## 2022-10-12 DIAGNOSIS — J309 Allergic rhinitis, unspecified: Secondary | ICD-10-CM

## 2022-10-12 DIAGNOSIS — E039 Hypothyroidism, unspecified: Secondary | ICD-10-CM | POA: Diagnosis not present

## 2022-10-12 DIAGNOSIS — H16229 Keratoconjunctivitis sicca, not specified as Sjogren's, unspecified eye: Secondary | ICD-10-CM | POA: Insufficient documentation

## 2022-10-12 DIAGNOSIS — M2550 Pain in unspecified joint: Secondary | ICD-10-CM

## 2022-10-12 DIAGNOSIS — M3501 Sicca syndrome with keratoconjunctivitis: Secondary | ICD-10-CM | POA: Insufficient documentation

## 2022-10-12 DIAGNOSIS — R350 Frequency of micturition: Secondary | ICD-10-CM | POA: Diagnosis not present

## 2022-10-12 LAB — POCT URINALYSIS DIP (CLINITEK)
Bilirubin, UA: NEGATIVE
Blood, UA: NEGATIVE
Glucose, UA: NEGATIVE mg/dL
Ketones, POC UA: NEGATIVE mg/dL
Nitrite, UA: POSITIVE — AB
POC PROTEIN,UA: NEGATIVE
Spec Grav, UA: 1.015 (ref 1.010–1.025)
Urobilinogen, UA: 0.2 E.U./dL
pH, UA: 5.5 (ref 5.0–8.0)

## 2022-10-12 MED ORDER — TRIAMCINOLONE ACETONIDE 55 MCG/ACT NA AERO: 2.0000 | INHALATION_SPRAY | Freq: Every day | NASAL | 3 refills | Status: AC

## 2022-10-12 MED ORDER — DULOXETINE HCL 30 MG PO CPEP: 30.0000 mg | ORAL_CAPSULE | Freq: Every day | ORAL | 3 refills | Status: DC

## 2022-10-12 MED ORDER — SULFAMETHOXAZOLE-TRIMETHOPRIM 800-160 MG PO TABS: 1.0000 | ORAL_TABLET | Freq: Two times a day (BID) | ORAL | 0 refills | Status: DC

## 2022-10-12 MED ORDER — MONTELUKAST SODIUM 10 MG PO TABS: 10.0000 mg | ORAL_TABLET | Freq: Every day | ORAL | 3 refills | Status: DC

## 2022-10-12 MED ORDER — ONDANSETRON HCL 4 MG PO TABS: 4.0000 mg | ORAL_TABLET | Freq: Three times a day (TID) | ORAL | 3 refills | Status: DC | PRN

## 2022-10-12 NOTE — Progress Notes (Addendum)
        Established patient visit  History, exam, impression, and plan:  1. Benign essential hypertension Cardiology.  Was at their office yesterday and had some medication changes made.  Blood pressure is elevated on arrival in our office, recheck is still elevated 158/77.  On exam, heart rate regular, no peripheral edema. Checking some labs today. - CBC with Differential/Platelet - COMPLETE METABOLIC PANEL WITH GFR - Lipid panel  2. Allergic sinusitis Continues to struggle with allergies and asthma symptoms associated with it.  She is taking an over-the-counter antihistamine and using Nasacort nasal spray.  Some benefit from these however it is not helping enough at this point.  Having to use her albuterol inhaler for wheezing in the evenings after exposure during the day.  On exam, she does have regular respirations, unlabored, even.  Intermittent scattered wheezing throughout.  Increased sinus congestion with postnasal drip.  Continue over-the-counter antihistamine and Nasacort nasal spray.  Adding Singulair 10 mg daily.  Avoiding steroids at this time due to upcoming surgery.  3. Hypothyroidism, unspecified type Taking levothyroxine 112 mcg daily, tolerating well without side effects.  No concerning symptoms at this time.  Checking TSH today.  Continue levothyroxine as prescribed, titrate depending on results. - TSH  4. Urinary frequency Reports that she has had an increase in urinary frequency lately and would like to have her urine checked.  No burning, urgency, hematuria, or urinary odor.  POCT urinalysis positive for nitrites and small leukocytes.  Sending for culture.  Since she has upcoming surgery, treating with Bactrim twice daily x 3 days. - POCT URINALYSIS DIP (CLINITEK) - Urine Culture  5. Vitamin D deficiency Checking vitamin D. - VITAMIN D 25 Hydroxy (Vit-D Deficiency, Fractures)  6. Myalgia 7. Polyarthralgia History of osteoarthritis affecting multiple joints.  Also  reports that she has muscle tenderness at times that is not well-managed.  Was told that she may benefit from Cymbalta rather than using muscle relaxers, anti-inflammatories, etc. that can have significant side effects.  Has been taking gabapentin which helps for pain but has been off of this for the last month in preparation for her upcoming surgery.  After discussion, we will start Cymbalta 30 mg daily and plan to monitor for effectiveness in approximately 6 weeks.  Procedures performed this visit: None.  Return in about 6 weeks (around 11/23/2022) for cymbalta follow up.  __________________________________ Clearnce Sorrel, DNP, APRN, FNP-BC Primary [facility] and [place]

## 2022-10-13 DIAGNOSIS — M48062 Spinal stenosis, lumbar region with neurogenic claudication: Secondary | ICD-10-CM | POA: Diagnosis not present

## 2022-10-13 LAB — COMPLETE METABOLIC PANEL WITH GFR
AG Ratio: 1.5 (calc) (ref 1.0–2.5)
ALT: 15 U/L (ref 6–29)
AST: 18 U/L (ref 10–35)
Albumin: 4.4 g/dL (ref 3.6–5.1)
Alkaline phosphatase (APISO): 66 U/L (ref 37–153)
BUN/Creatinine Ratio: 20 (calc) (ref 6–22)
BUN: 20 mg/dL (ref 7–25)
CO2: 30 mmol/L (ref 20–32)
Calcium: 9.4 mg/dL (ref 8.6–10.4)
Chloride: 100 mmol/L (ref 98–110)
Creat: 1 mg/dL — ABNORMAL HIGH (ref 0.60–0.95)
Globulin: 2.9 g/dL (calc) (ref 1.9–3.7)
Glucose, Bld: 98 mg/dL (ref 65–99)
Potassium: 4.1 mmol/L (ref 3.5–5.3)
Sodium: 138 mmol/L (ref 135–146)
Total Bilirubin: 0.7 mg/dL (ref 0.2–1.2)
Total Protein: 7.3 g/dL (ref 6.1–8.1)
eGFR: 55 mL/min/{1.73_m2} — ABNORMAL LOW (ref >=60)

## 2022-10-13 LAB — CBC WITH DIFFERENTIAL/PLATELET
Absolute Monocytes: 753 cells/uL (ref 200–950)
Basophils Absolute: 43 cells/uL (ref 0–200)
Basophils Relative: 0.6 %
Eosinophils Absolute: 327 cells/uL (ref 15–500)
Eosinophils Relative: 4.6 %
HCT: 42.3 % (ref 35.0–45.0)
Hemoglobin: 14.2 g/dL (ref 11.7–15.5)
Lymphs Abs: 2676.7 cells/uL (ref 850–3900)
MCH: 29.5 pg (ref 27.0–33.0)
MCHC: 33.6 g/dL (ref 32.0–36.0)
MCV: 87.8 fL (ref 80.0–100.0)
MPV: 10.4 fL (ref 7.5–12.5)
Monocytes Relative: 10.6 %
Neutro Abs: 3302 cells/uL (ref 1500–7800)
Neutrophils Relative %: 46.5 %
Platelets: 266 10*3/uL (ref 140–400)
RBC: 4.82 10*6/uL (ref 3.80–5.10)
RDW: 12.4 % (ref 11.0–15.0)
Total Lymphocyte: 37.7 %
WBC: 7.1 10*3/uL (ref 3.8–10.8)

## 2022-10-13 LAB — TSH: TSH: 1.43 mIU/L (ref 0.40–4.50)

## 2022-10-13 LAB — LIPID PANEL
Cholesterol: 166 mg/dL (ref <200)
HDL: 51 mg/dL (ref >=50)
LDL Cholesterol (Calc): 79 mg/dL (calc)
Non-HDL Cholesterol (Calc): 115 mg/dL (calc) (ref <130)
Total CHOL/HDL Ratio: 3.3 (calc) (ref <5.0)
Triglycerides: 275 mg/dL — ABNORMAL HIGH (ref <150)

## 2022-10-13 LAB — VITAMIN D 25 HYDROXY (VIT D DEFICIENCY, FRACTURES): Vit D, 25-Hydroxy: 38 ng/mL (ref 30–100)

## 2022-10-14 LAB — URINE CULTURE
MICRO NUMBER:: 14783143
SPECIMEN QUALITY:: ADEQUATE

## 2022-10-16 ENCOUNTER — Telehealth: Payer: Self-pay

## 2022-10-16 DIAGNOSIS — Z7982 Long term (current) use of aspirin: Secondary | ICD-10-CM | POA: Diagnosis not present

## 2022-10-16 DIAGNOSIS — Z79899 Other long term (current) drug therapy: Secondary | ICD-10-CM | POA: Diagnosis not present

## 2022-10-16 DIAGNOSIS — Z01818 Encounter for other preprocedural examination: Secondary | ICD-10-CM | POA: Diagnosis not present

## 2022-10-16 DIAGNOSIS — I4891 Unspecified atrial fibrillation: Secondary | ICD-10-CM | POA: Diagnosis not present

## 2022-10-16 DIAGNOSIS — Z950 Presence of cardiac pacemaker: Secondary | ICD-10-CM | POA: Diagnosis not present

## 2022-10-16 DIAGNOSIS — M48062 Spinal stenosis, lumbar region with neurogenic claudication: Secondary | ICD-10-CM | POA: Diagnosis not present

## 2022-10-16 MED ORDER — ACYCLOVIR 5 % EX OINT: 1.0000 | TOPICAL_OINTMENT | Freq: Every day | CUTANEOUS | 1 refills | Status: DC | PRN

## 2022-10-16 NOTE — Telephone Encounter (Signed)
[name] is requesting a prescription on Zovirax for her cold sores.

## 2022-10-16 NOTE — Telephone Encounter (Signed)
Patient advised.

## 2022-10-16 NOTE — Telephone Encounter (Signed)
Prescription for acyclovir ointment sent to the pharmacy as requested.  ___________________________________________ Thayer Ohm, DNP, APRN, FNP-BC Primary Care and Sports Medicine [facility]

## 2022-10-19 ENCOUNTER — Other Ambulatory Visit: Payer: Self-pay | Admitting: *Deleted

## 2022-10-19 ENCOUNTER — Ambulatory Visit (HOSPITAL_COMMUNITY): Payer: Medicare Other | Attending: Cardiology

## 2022-10-19 DIAGNOSIS — I350 Nonrheumatic aortic (valve) stenosis: Secondary | ICD-10-CM

## 2022-10-19 DIAGNOSIS — C8290 Follicular lymphoma, unspecified, unspecified site: Secondary | ICD-10-CM | POA: Diagnosis not present

## 2022-10-19 LAB — ECHOCARDIOGRAM COMPLETE
AR max vel: 0.96 cm2
AV Area VTI: 0.97 cm2
AV Area mean vel: 0.96 cm2
AV Mean grad: 27 mmHg
AV Peak grad: 49.6 mmHg
Ao pk vel: 3.52 m/s
Area-P 1/2: 3.48 cm2
P 1/2 time: 381 msec
S' Lateral: 3.9 cm

## 2022-10-23 DIAGNOSIS — I480 Paroxysmal atrial fibrillation: Secondary | ICD-10-CM | POA: Diagnosis present

## 2022-10-23 DIAGNOSIS — Z79890 Hormone replacement therapy (postmenopausal): Secondary | ICD-10-CM | POA: Diagnosis not present

## 2022-10-23 DIAGNOSIS — M8580 Other specified disorders of bone density and structure, unspecified site: Secondary | ICD-10-CM | POA: Diagnosis present

## 2022-10-23 DIAGNOSIS — Z8572 Personal history of non-Hodgkin lymphomas: Secondary | ICD-10-CM | POA: Diagnosis not present

## 2022-10-23 DIAGNOSIS — K219 Gastro-esophageal reflux disease without esophagitis: Secondary | ICD-10-CM | POA: Diagnosis present

## 2022-10-23 DIAGNOSIS — M47816 Spondylosis without myelopathy or radiculopathy, lumbar region: Secondary | ICD-10-CM | POA: Diagnosis present

## 2022-10-23 DIAGNOSIS — Z79899 Other long term (current) drug therapy: Secondary | ICD-10-CM | POA: Diagnosis not present

## 2022-10-23 DIAGNOSIS — M48062 Spinal stenosis, lumbar region with neurogenic claudication: Secondary | ICD-10-CM | POA: Diagnosis not present

## 2022-10-23 DIAGNOSIS — I10 Essential (primary) hypertension: Secondary | ICD-10-CM | POA: Diagnosis present

## 2022-10-23 DIAGNOSIS — Z7982 Long term (current) use of aspirin: Secondary | ICD-10-CM | POA: Diagnosis not present

## 2022-10-23 DIAGNOSIS — M4316 Spondylolisthesis, lumbar region: Secondary | ICD-10-CM | POA: Diagnosis present

## 2022-10-23 DIAGNOSIS — M4326 Fusion of spine, lumbar region: Secondary | ICD-10-CM | POA: Diagnosis not present

## 2022-10-23 DIAGNOSIS — H9190 Unspecified hearing loss, unspecified ear: Secondary | ICD-10-CM | POA: Diagnosis present

## 2022-10-23 DIAGNOSIS — Z7951 Long term (current) use of inhaled steroids: Secondary | ICD-10-CM | POA: Diagnosis not present

## 2022-10-23 DIAGNOSIS — Z419 Encounter for procedure for purposes other than remedying health state, unspecified: Secondary | ICD-10-CM | POA: Diagnosis not present

## 2022-10-23 DIAGNOSIS — Z950 Presence of cardiac pacemaker: Secondary | ICD-10-CM | POA: Diagnosis not present

## 2022-10-23 DIAGNOSIS — E785 Hyperlipidemia, unspecified: Secondary | ICD-10-CM | POA: Diagnosis present

## 2022-10-23 DIAGNOSIS — Z85828 Personal history of other malignant neoplasm of skin: Secondary | ICD-10-CM | POA: Diagnosis not present

## 2022-10-23 DIAGNOSIS — Z96653 Presence of artificial knee joint, bilateral: Secondary | ICD-10-CM | POA: Diagnosis present

## 2022-10-23 DIAGNOSIS — E039 Hypothyroidism, unspecified: Secondary | ICD-10-CM | POA: Diagnosis present

## 2022-10-23 DIAGNOSIS — M5416 Radiculopathy, lumbar region: Secondary | ICD-10-CM | POA: Diagnosis present

## 2022-10-23 NOTE — Progress Notes (Deleted)
HPI: Follow-up AS/AI, paroxysmal atrial fibrillation and history of syncope (bradycardia mediated status post pacemaker).  Patient admitted September 2021 with syncopal episode.  Found to be bradycardic and ultimately underwent pacemaker. Carotid Doppler September 2021 showed 1 to 39% bilateral stenosis.  Nuclear study August 2022 showed ejection fraction 40% with normal perfusion. 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 Echocardiogram April 2024 showed normal LV function, mild left ventricular hypertrophy, grade 1 diastolic dysfunction, mild mitral regurgitation, moderate aortic stenosis with mean gradient 27 mmHg, aortic valve area 1 cm and mild aortic insufficiency.  At last office visit beta-blocker was increased due to worsening episodes of SVT.  Since last seen,   Current Outpatient Medications  Medication Sig Dispense Refill   acyclovir ointment (ZOVIRAX) 5 % Apply 1 Application topically daily as needed. 30 g 1   albuterol (VENTOLIN HFA) 108 (90 Base) MCG/ACT inhaler Inhale 2 puffs into the lungs every 6 (six) hours as needed for wheezing or shortness of breath. 8 g 0   amLODipine (NORVASC) 5 MG tablet Take 1 tablet (5 mg total) by mouth daily. 90 tablet 3   aspirin EC 81 MG tablet Take 81 mg by mouth daily.     baclofen (LIORESAL) 10 MG tablet TAKE 1 TABLET BY MOUTH 3  TIMES DAILY AS NEEDED FOR  MUSCLE SPASMS 270 tablet 0   Clobetasol Propionate 0.05 % shampoo Apply 1 application topically daily. As needed for scalp irritation from psoriasis 236 mL 3   conjugated estrogens (PREMARIN) vaginal cream Place 1 Applicatorful vaginally daily. 42.5 g 12   DULoxetine (CYMBALTA) 30 MG capsule Take 1 capsule (30 mg total) by mouth daily. 90 capsule 3   fluticasone (FLOVENT HFA) 110 MCG/ACT inhaler Inhale 2 puffs into the lungs 2 (two) times daily as needed (for shortness of breath or wheezing). 3 each 11   gabapentin (NEURONTIN) 100 MG capsule TAKE 1 CAPSULE BY MOUTH   TWICE DAILY AS NEEDED 180 capsule 2   hydrochlorothiazide (HYDRODIURIL) 12.5 MG tablet TAKE 1 TABLET BY MOUTH DAILY 90 tablet 3   levothyroxine (SYNTHROID) 112 MCG tablet TAKE 1 TABLET BY MOUTH DAILY  BEFORE BREAKFAST 90 tablet 3   meclizine (ANTIVERT) 25 MG tablet Take 1 tablet (25 mg total) by mouth 3 (three) times daily as needed for dizziness. 30 tablet 0   meloxicam (MOBIC) 15 MG tablet Take 15 mg by mouth daily as needed for pain.     metoprolol tartrate (LOPRESSOR) 100 MG tablet Take 100 mg by mouth 2 (two) times daily.     mometasone (ELOCON) 0.1 % cream Apply 1 Application topically daily. To vaginal area as needed for redness/irritation. Can use twice daily for the first week 100 g 1   montelukast (SINGULAIR) 10 MG tablet Take 1 tablet (10 mg total) by mouth at bedtime. 90 tablet 3   omeprazole (PRILOSEC) 40 MG capsule TAKE 1 CAPSULE BY MOUTH IN  THE MORNING AND MAY REPEAT  DOSE IN THE EVENING AS  NEEDED FOR ACID REFLUX /  INDIGESTION 180 capsule 3   ondansetron (ZOFRAN) 4 MG tablet Take 1 tablet (4 mg total) by mouth every 8 (eight) hours as needed for nausea or vomiting. 30 tablet 3   sennosides-docusate sodium (SENOKOT-S) 8.6-50 MG tablet Take 2 tablets by mouth daily. 30 tablet 1   sulfamethoxazole-trimethoprim (BACTRIM DS) 800-160 MG tablet Take 1 tablet by mouth 2 (two) times daily. 6 tablet 0   tetrahydrozoline 0.05 % ophthalmic  solution Place 2 drops into both eyes daily as needed (for dry eyes).     triamcinolone (NASACORT ALLERGY 24HR) 55 MCG/ACT AERO nasal inhaler Place 2 sprays into the nose daily. 3 each 3   valsartan (DIOVAN) 80 MG tablet TAKE 1 TABLET BY MOUTH DAILY 90 tablet 3   No current facility-administered medications for this visit.     Past Medical History:  Diagnosis Date   Acquired trigger finger 08/05/2013   Arthritis    Asthma    Atrial fibrillation    paroxysmal , addressed  with metoprolol    Callus of foot 09/03/2018   Chronic sinusitis     Complication of anesthesia    Dyspnea    sob on exertion, reports at her pre-op today 09-13-2018 that her cardiologist has planned for her to do a ECHO for evaluation    Family history of adverse reaction to anesthesia    sister is hard to wake up from Anesthesia   Follicular lymphoma    received Chemotherapy (2 years ago)  and radiation (last 12/2017)   Hyperlipidemia    Hypertension    Hypothyroidism    Meniere's disease    PONV (postoperative nausea and vomiting)    Primary localized osteoarthritis of left knee 09/24/2018   Primary localized osteoarthritis of right knee 06/18/2018   Reflux    Sebaceous cyst 09/03/2018   Shingles 10/24/2019    Past Surgical History:  Procedure Laterality Date   ABDOMINAL HYSTERECTOMY     BONE MARROW BIOPSY  06/16/2014   at [facility]   CARPAL TUNNEL RELEASE     bilateral   CHOLECYSTECTOMY     KNEE ARTHROSCOPY     PACEMAKER IMPLANT N/A 03/30/2020   Procedure: PACEMAKER IMPLANT;  Surgeon: Hillis Range, MD;  Location: [facility];  Service: Cardiovascular;  Laterality: N/A;   PARTIAL KNEE ARTHROPLASTY Right 06/18/2018   Procedure: UNICOMPARTMENTAL KNEE;  Surgeon: Teryl Lucy, MD;  Location: [facility];  Service: Orthopedics;  Laterality: Right;   PARTIAL KNEE ARTHROPLASTY Left 09/24/2018   Procedure: UNICOMPARTMENTAL KNEE;  Surgeon: Teryl Lucy, MD;  Location: [facility];  Service: Orthopedics;  Laterality: Left;    Social History   Socioeconomic History   Marital status: Widowed    Spouse name: Not on file   Number of children: 1   Years of education: 39   Highest education level: 12th grade  Occupational History   Occupation: Advertising account planner    Comment: retired  Tobacco Use   Smoking status: Never   Smokeless tobacco: Never  Vaping Use   Vaping Use: Never used  Substance and Sexual Activity   Alcohol use: No   Drug use: No   Sexual activity: Not Currently    Partners: Male  Other Topics Concern   Not on file  Social History  Narrative   No exercise. 2 cups of coffee a day    Social Determinants of Health   Financial Resource Strain: Low Risk  (10/11/2022)   Overall Financial Resource Strain (CARDIA)    Difficulty of Paying Living Expenses: Not hard at all  Food Insecurity: No Food Insecurity (10/11/2022)   Hunger Vital Sign    Worried About Running Out of Food in the Last Year: Never true    Ran Out of Food in the Last Year: Never true  Transportation Needs: No Transportation Needs (10/11/2022)   PRAPARE - Administrator, Civil Service (Medical): No    Lack of Transportation (Non-Medical): No  Physical Activity: Unknown (10/11/2022)   Exercise Vital Sign    Days of Exercise per Week: 0 days    Minutes of Exercise per Session: Not on file  Stress: No Stress Concern Present (10/11/2022)   [employer] of Occupational Health - Occupational Stress Questionnaire    Feeling of Stress : Only a little  Social Connections: Moderately Integrated (10/11/2022)   Social Connection and Isolation Panel [NHANES]    Frequency of Communication with Friends and Family: More than three times a week    Frequency of Social Gatherings with Friends and Family: Once a week    Attends Religious Services: More than 4 times per year    Active Member of [employer] or Organizations: Yes    Attends Banker Meetings: More than 4 times per year    Marital Status: Widowed  Intimate Partner Violence: Not At Risk (03/10/2019)   Humiliation, Afraid, Rape, and Kick questionnaire    Fear of Current or Ex-Partner: No    Emotionally Abused: No    Physically Abused: No    Sexually Abused: No    Family History  Problem Relation Age of Onset   Skin cancer Mother    Stroke Mother    Prostate cancer Father    High blood pressure Father    Heart attack Father    Heart disease Other     ROS: no fevers or chills, productive cough, hemoptysis, dysphasia, odynophagia, melena, hematochezia, dysuria, hematuria, rash, seizure  activity, orthopnea, PND, pedal edema, claudication. Remaining systems are negative.  Physical Exam: Well-developed well-nourished in no acute distress.  Skin is warm and dry.  HEENT is normal.  Neck is supple.  Chest is clear to auscultation with normal expansion.  Cardiovascular exam is regular rate and rhythm.  Abdominal exam nontender or distended. No masses palpated. Extremities show no edema. neuro grossly intact  ECG- personally reviewed  A/P  1 aortic stenosis-patient remains asymptomatic.  Plan follow-up echocardiogram April 2025.  She understands she will likely require aortic valve replacement in the future.  2 paroxysmal atrial fibrillation-noted in remote past by Dr. Johney Frame on device check.  However episode was less than 30 seconds and he felt anticoagulation not indicated.  She has had no recurrences on device checks.  3 hypertension-patient's blood pressure is controlled.  Continue present medications and follow.  4 pacemaker-Per EP.  5 history of PVCs-continue beta-blocker.  6 history of AVNRT- patient did not tolerate flecainide.  Beta-blocker was increased at previous office visit and her symptoms have improved.  Will follow.  She is also followed by Dr. Elberta Fortis.  Olga Millers, MD

## 2022-10-24 DIAGNOSIS — K219 Gastro-esophageal reflux disease without esophagitis: Secondary | ICD-10-CM | POA: Diagnosis present

## 2022-10-24 DIAGNOSIS — H9190 Unspecified hearing loss, unspecified ear: Secondary | ICD-10-CM | POA: Diagnosis present

## 2022-10-24 DIAGNOSIS — I10 Essential (primary) hypertension: Secondary | ICD-10-CM | POA: Diagnosis present

## 2022-10-24 DIAGNOSIS — M47816 Spondylosis without myelopathy or radiculopathy, lumbar region: Secondary | ICD-10-CM | POA: Diagnosis present

## 2022-10-24 DIAGNOSIS — Z96653 Presence of artificial knee joint, bilateral: Secondary | ICD-10-CM | POA: Diagnosis present

## 2022-10-24 DIAGNOSIS — I480 Paroxysmal atrial fibrillation: Secondary | ICD-10-CM | POA: Diagnosis present

## 2022-10-24 DIAGNOSIS — Z7951 Long term (current) use of inhaled steroids: Secondary | ICD-10-CM | POA: Diagnosis not present

## 2022-10-24 DIAGNOSIS — Z8572 Personal history of non-Hodgkin lymphomas: Secondary | ICD-10-CM | POA: Diagnosis not present

## 2022-10-24 DIAGNOSIS — Z7982 Long term (current) use of aspirin: Secondary | ICD-10-CM | POA: Diagnosis not present

## 2022-10-24 DIAGNOSIS — Z950 Presence of cardiac pacemaker: Secondary | ICD-10-CM | POA: Diagnosis not present

## 2022-10-24 DIAGNOSIS — Z85828 Personal history of other malignant neoplasm of skin: Secondary | ICD-10-CM | POA: Diagnosis not present

## 2022-10-24 DIAGNOSIS — Z79899 Other long term (current) drug therapy: Secondary | ICD-10-CM | POA: Diagnosis not present

## 2022-10-24 DIAGNOSIS — M8580 Other specified disorders of bone density and structure, unspecified site: Secondary | ICD-10-CM | POA: Diagnosis present

## 2022-10-24 DIAGNOSIS — Z419 Encounter for procedure for purposes other than remedying health state, unspecified: Secondary | ICD-10-CM | POA: Insufficient documentation

## 2022-10-24 DIAGNOSIS — E785 Hyperlipidemia, unspecified: Secondary | ICD-10-CM | POA: Diagnosis present

## 2022-10-24 DIAGNOSIS — M4316 Spondylolisthesis, lumbar region: Secondary | ICD-10-CM | POA: Diagnosis present

## 2022-10-24 DIAGNOSIS — E039 Hypothyroidism, unspecified: Secondary | ICD-10-CM | POA: Diagnosis present

## 2022-10-24 DIAGNOSIS — Z79890 Hormone replacement therapy (postmenopausal): Secondary | ICD-10-CM | POA: Diagnosis not present

## 2022-10-24 DIAGNOSIS — M48062 Spinal stenosis, lumbar region with neurogenic claudication: Secondary | ICD-10-CM | POA: Diagnosis present

## 2022-10-24 DIAGNOSIS — M4326 Fusion of spine, lumbar region: Secondary | ICD-10-CM | POA: Diagnosis not present

## 2022-10-24 DIAGNOSIS — M5416 Radiculopathy, lumbar region: Secondary | ICD-10-CM | POA: Diagnosis present

## 2022-10-26 ENCOUNTER — Telehealth: Payer: Self-pay

## 2022-10-26 ENCOUNTER — Encounter: Payer: Self-pay | Admitting: Cardiology

## 2022-10-26 DIAGNOSIS — Z7982 Long term (current) use of aspirin: Secondary | ICD-10-CM | POA: Diagnosis not present

## 2022-10-26 DIAGNOSIS — H9192 Unspecified hearing loss, left ear: Secondary | ICD-10-CM | POA: Diagnosis not present

## 2022-10-26 DIAGNOSIS — Z981 Arthrodesis status: Secondary | ICD-10-CM | POA: Diagnosis not present

## 2022-10-26 DIAGNOSIS — Z9181 History of falling: Secondary | ICD-10-CM | POA: Diagnosis not present

## 2022-10-26 DIAGNOSIS — E785 Hyperlipidemia, unspecified: Secondary | ICD-10-CM | POA: Diagnosis not present

## 2022-10-26 DIAGNOSIS — Z4789 Encounter for other orthopedic aftercare: Secondary | ICD-10-CM | POA: Diagnosis not present

## 2022-10-26 DIAGNOSIS — M4316 Spondylolisthesis, lumbar region: Secondary | ICD-10-CM | POA: Diagnosis not present

## 2022-10-26 DIAGNOSIS — M48062 Spinal stenosis, lumbar region with neurogenic claudication: Secondary | ICD-10-CM | POA: Diagnosis not present

## 2022-10-26 DIAGNOSIS — Z950 Presence of cardiac pacemaker: Secondary | ICD-10-CM | POA: Diagnosis not present

## 2022-10-26 DIAGNOSIS — I10 Essential (primary) hypertension: Secondary | ICD-10-CM | POA: Diagnosis not present

## 2022-10-26 DIAGNOSIS — I4891 Unspecified atrial fibrillation: Secondary | ICD-10-CM | POA: Diagnosis not present

## 2022-10-26 DIAGNOSIS — M4726 Other spondylosis with radiculopathy, lumbar region: Secondary | ICD-10-CM | POA: Diagnosis not present

## 2022-10-26 DIAGNOSIS — M5116 Intervertebral disc disorders with radiculopathy, lumbar region: Secondary | ICD-10-CM | POA: Diagnosis not present

## 2022-10-26 DIAGNOSIS — Z8572 Personal history of non-Hodgkin lymphomas: Secondary | ICD-10-CM | POA: Diagnosis not present

## 2022-10-26 NOTE — Transitions of Care (Post Inpatient/ED Visit) (Signed)
   10/26/2022  Name: [name] MRN: [id number] DOB: 12-Dec-1934  Today's TOC FU Call Status: Today's TOC FU Call Status:: Successful TOC FU Call Competed TOC FU Call Complete Date: 10/26/22  Transition Care Management Follow-up Telephone Call Date of Discharge: 10/25/22 Discharge Facility: [facility] Type of Discharge: Inpatient Admission Primary Inpatient Discharge Diagnosis:: lumbar surgery How have you been since you were released from the hospital?: Better Any questions or concerns?: No  Items Reviewed: Did you receive and understand the discharge instructions provided?: Yes Medications obtained and verified?: Yes (Medications Reviewed) Any new allergies since your discharge?: No Dietary orders reviewed?: Yes Do you have support at home?: Yes People in Home: child(ren), adult  Home Care and Equipment/Supplies: Were Home Health Services Ordered?: Yes Name of Home Health Agency:: [facility] Has Agency set up a time to come to your home?: Yes First Home Health Visit Date: 10/26/22 Any new equipment or medical supplies ordered?: NA  Functional Questionnaire: Do you need assistance with bathing/showering or dressing?: Yes Do you need assistance with meal preparation?: Yes Do you need assistance with eating?: No Do you have difficulty maintaining continence: No Do you need assistance with getting out of bed/getting out of a chair/moving?: Yes Do you have difficulty managing or taking your medications?: No  Follow up appointments reviewed: Specialist Hospital Follow-up appointment confirmed?: Yes Date of Specialist follow-up appointment?: 11/10/22 Follow-Up Specialty Provider:: surgeon Do you need transportation to your follow-up appointment?: No Do you understand care options if your condition(s) worsen?: Yes-patient verbalized understanding    SIGNATURE Karena Addison, LPN [facility] Nurse Health Advisor Direct Dial [phone]

## 2022-10-27 ENCOUNTER — Telehealth: Payer: Self-pay

## 2022-10-27 ENCOUNTER — Other Ambulatory Visit: Payer: Self-pay | Admitting: Cardiology

## 2022-10-27 DIAGNOSIS — Z4789 Encounter for other orthopedic aftercare: Secondary | ICD-10-CM | POA: Diagnosis not present

## 2022-10-27 DIAGNOSIS — M4726 Other spondylosis with radiculopathy, lumbar region: Secondary | ICD-10-CM | POA: Diagnosis not present

## 2022-10-27 DIAGNOSIS — I4891 Unspecified atrial fibrillation: Secondary | ICD-10-CM | POA: Diagnosis not present

## 2022-10-27 DIAGNOSIS — M48062 Spinal stenosis, lumbar region with neurogenic claudication: Secondary | ICD-10-CM | POA: Diagnosis not present

## 2022-10-27 DIAGNOSIS — M4316 Spondylolisthesis, lumbar region: Secondary | ICD-10-CM | POA: Diagnosis not present

## 2022-10-27 DIAGNOSIS — M5116 Intervertebral disc disorders with radiculopathy, lumbar region: Secondary | ICD-10-CM | POA: Diagnosis not present

## 2022-10-30 DIAGNOSIS — M5116 Intervertebral disc disorders with radiculopathy, lumbar region: Secondary | ICD-10-CM | POA: Diagnosis not present

## 2022-10-30 DIAGNOSIS — M4726 Other spondylosis with radiculopathy, lumbar region: Secondary | ICD-10-CM | POA: Diagnosis not present

## 2022-10-30 DIAGNOSIS — M48062 Spinal stenosis, lumbar region with neurogenic claudication: Secondary | ICD-10-CM | POA: Diagnosis not present

## 2022-10-30 DIAGNOSIS — Z4789 Encounter for other orthopedic aftercare: Secondary | ICD-10-CM | POA: Diagnosis not present

## 2022-10-30 DIAGNOSIS — I4891 Unspecified atrial fibrillation: Secondary | ICD-10-CM | POA: Diagnosis not present

## 2022-10-30 DIAGNOSIS — M4316 Spondylolisthesis, lumbar region: Secondary | ICD-10-CM | POA: Diagnosis not present

## 2022-10-31 ENCOUNTER — Ambulatory Visit (INDEPENDENT_AMBULATORY_CARE_PROVIDER_SITE_OTHER): Payer: Medicare Other | Admitting: Medical-Surgical

## 2022-10-31 ENCOUNTER — Telehealth: Payer: Self-pay

## 2022-10-31 ENCOUNTER — Encounter: Payer: Self-pay | Admitting: Medical-Surgical

## 2022-10-31 DIAGNOSIS — R399 Unspecified symptoms and signs involving the genitourinary system: Secondary | ICD-10-CM

## 2022-10-31 LAB — POCT URINALYSIS DIP (CLINITEK)
Bilirubin, UA: NEGATIVE
Blood, UA: NEGATIVE
Glucose, UA: NEGATIVE mg/dL
Ketones, POC UA: NEGATIVE mg/dL
Leukocytes, UA: NEGATIVE
Nitrite, UA: NEGATIVE
POC PROTEIN,UA: NEGATIVE
Spec Grav, UA: 1.015 (ref 1.010–1.025)
Urobilinogen, UA: 0.2 E.U./dL
pH, UA: 6 (ref 5.0–8.0)

## 2022-10-31 NOTE — Progress Notes (Signed)
POCT urinalysis negative for any indications of UTI.  Sending culture for verification.  Okay to use over-the-counter Azo Standard for urinary pain/burning relief over the next 2 to 3 days if desired.  Continue to drink plenty of fluids.  Recommend regular pericare and monitoring for any additional symptoms. ___________________________________________ Thayer Ohm, DNP, APRN, FNP-BC Primary Care and Sports Medicine [facility] [place]

## 2022-10-31 NOTE — Progress Notes (Signed)
Patient's daughter dropped of Urine for the patient.  Message continued in MyChart.

## 2022-10-31 NOTE — Addendum Note (Signed)
Addended by: Chalmers Cater on: 10/31/2022 10:54 AM   Modules accepted: Orders

## 2022-10-31 NOTE — Telephone Encounter (Signed)
Requested transmission received from Pt.  Message received from [facility]:  Clinic requested transmission 36 NSVT, EGM's show NSVT < 20 beats, and AF with RVR 60 fast AV showing short V to A, as well as AF with RVR 2 AF, longest duration 1 hr , poor rate control, burden 0.7%, no OAC per EPIC  Clinic requested transmission d/t mychart message sent by daughter:  [name] (proxy for Orbie Pyo)  to [facility] Triage (supporting Will Jorja Loa, MD)     10/26/22  9:22 PM Mom came home from the hospital yesterday after her back surgery, and has had two episodes similar to what she described to Dr. Elberta Fortis last time she saw him. I'm not sure if these are cardiac related but was wondering if you could check her pacemaker during these times for any abnormalities that might be responsible. During both episodes she looks right through me, unfocused, and does not respond to verbal stimuli. The first one lasted about 30-60 seconds and the last just 15 seconds or so. These are the periods of time to check:   4/18 12:20 - 12:50 am 4/18 7:00 - 7:30 pm  Reviewed transmission.  No cardiac abnormalities noted during times submitted via MyChart. Afib episodes appear to be noise.  Reviewed with [facility] representative.  Question if Pt was using a TENS unit.  Message sent back to daughter who advised Pt is not using a TENS unit.  States Pt had an episode of heart racing on 10/30/22.  Per review of transmission Pt has had an increase in tachyarrhthymias, Pt with a known history of SVT.    Pt stopped flecainide 06/2022 d/t side effects.  Plan was to start amiodarone after stopping flecainide, but it was decided to hold off on starting amiodarone and continue to monitor.  Discussed with daughter.  Will continue to monitor for symptoms and resubmit a manual transmission next week to see if heart racing has subsided.  Pt had back surgery on 10/23/2022 and daughter thinks these "spells" are  improving.  Advised if tachyarrhythmias continue they might want to consider starting the amiodarone.  Daughter agrees.  (Symptomatic episode #465 imported below with presenting)  Will check transmission 11/07/2022.

## 2022-11-01 DIAGNOSIS — M48062 Spinal stenosis, lumbar region with neurogenic claudication: Secondary | ICD-10-CM | POA: Diagnosis not present

## 2022-11-01 DIAGNOSIS — Z4789 Encounter for other orthopedic aftercare: Secondary | ICD-10-CM | POA: Diagnosis not present

## 2022-11-01 DIAGNOSIS — M4726 Other spondylosis with radiculopathy, lumbar region: Secondary | ICD-10-CM | POA: Diagnosis not present

## 2022-11-01 DIAGNOSIS — M4316 Spondylolisthesis, lumbar region: Secondary | ICD-10-CM | POA: Diagnosis not present

## 2022-11-01 LAB — URINE CULTURE
MICRO NUMBER:: 14862132
SPECIMEN QUALITY:: ADEQUATE

## 2022-11-01 NOTE — Progress Notes (Signed)
Remote pacemaker transmission.   

## 2022-11-02 ENCOUNTER — Ambulatory Visit: Payer: Medicare Other | Attending: Cardiology | Admitting: Cardiology

## 2022-11-02 DIAGNOSIS — M4726 Other spondylosis with radiculopathy, lumbar region: Secondary | ICD-10-CM | POA: Diagnosis not present

## 2022-11-02 DIAGNOSIS — M48062 Spinal stenosis, lumbar region with neurogenic claudication: Secondary | ICD-10-CM | POA: Diagnosis not present

## 2022-11-02 DIAGNOSIS — Z4789 Encounter for other orthopedic aftercare: Secondary | ICD-10-CM | POA: Diagnosis not present

## 2022-11-02 DIAGNOSIS — I4891 Unspecified atrial fibrillation: Secondary | ICD-10-CM | POA: Diagnosis not present

## 2022-11-02 DIAGNOSIS — M4316 Spondylolisthesis, lumbar region: Secondary | ICD-10-CM | POA: Diagnosis not present

## 2022-11-02 DIAGNOSIS — M5116 Intervertebral disc disorders with radiculopathy, lumbar region: Secondary | ICD-10-CM | POA: Diagnosis not present

## 2022-11-03 DIAGNOSIS — M5116 Intervertebral disc disorders with radiculopathy, lumbar region: Secondary | ICD-10-CM | POA: Diagnosis not present

## 2022-11-03 DIAGNOSIS — M48062 Spinal stenosis, lumbar region with neurogenic claudication: Secondary | ICD-10-CM | POA: Diagnosis not present

## 2022-11-03 DIAGNOSIS — M4316 Spondylolisthesis, lumbar region: Secondary | ICD-10-CM | POA: Diagnosis not present

## 2022-11-03 DIAGNOSIS — Z4789 Encounter for other orthopedic aftercare: Secondary | ICD-10-CM | POA: Diagnosis not present

## 2022-11-03 DIAGNOSIS — M4726 Other spondylosis with radiculopathy, lumbar region: Secondary | ICD-10-CM | POA: Diagnosis not present

## 2022-11-03 DIAGNOSIS — I4891 Unspecified atrial fibrillation: Secondary | ICD-10-CM | POA: Diagnosis not present

## 2022-11-06 DIAGNOSIS — M48062 Spinal stenosis, lumbar region with neurogenic claudication: Secondary | ICD-10-CM | POA: Diagnosis not present

## 2022-11-06 DIAGNOSIS — Z4789 Encounter for other orthopedic aftercare: Secondary | ICD-10-CM | POA: Diagnosis not present

## 2022-11-06 DIAGNOSIS — M4726 Other spondylosis with radiculopathy, lumbar region: Secondary | ICD-10-CM | POA: Diagnosis not present

## 2022-11-06 DIAGNOSIS — I4891 Unspecified atrial fibrillation: Secondary | ICD-10-CM | POA: Diagnosis not present

## 2022-11-06 DIAGNOSIS — M4316 Spondylolisthesis, lumbar region: Secondary | ICD-10-CM | POA: Diagnosis not present

## 2022-11-06 DIAGNOSIS — M5116 Intervertebral disc disorders with radiculopathy, lumbar region: Secondary | ICD-10-CM | POA: Diagnosis not present

## 2022-11-07 NOTE — Transitions of Care (Post Inpatient/ED Visit) (Signed)
   11/07/2022  Name: [name] MRN: [id number] DOB: [birth year]  Today's TOC FU Call Status:    Transition Care Management Follow-up Telephone Call    Items Reviewed:    Home Care and Equipment/Supplies:    Functional Questionnaire:    Follow up appointments reviewed:      SIGNATURE Karena Addison, LPN [facility] Nurse Health Advisor Direct Dial [phone]

## 2022-11-09 DIAGNOSIS — M4726 Other spondylosis with radiculopathy, lumbar region: Secondary | ICD-10-CM | POA: Diagnosis not present

## 2022-11-09 DIAGNOSIS — M4316 Spondylolisthesis, lumbar region: Secondary | ICD-10-CM | POA: Diagnosis not present

## 2022-11-09 DIAGNOSIS — M5116 Intervertebral disc disorders with radiculopathy, lumbar region: Secondary | ICD-10-CM | POA: Diagnosis not present

## 2022-11-09 DIAGNOSIS — M48062 Spinal stenosis, lumbar region with neurogenic claudication: Secondary | ICD-10-CM | POA: Diagnosis not present

## 2022-11-09 DIAGNOSIS — Z4789 Encounter for other orthopedic aftercare: Secondary | ICD-10-CM | POA: Diagnosis not present

## 2022-11-09 DIAGNOSIS — I4891 Unspecified atrial fibrillation: Secondary | ICD-10-CM | POA: Diagnosis not present

## 2022-11-13 DIAGNOSIS — M4316 Spondylolisthesis, lumbar region: Secondary | ICD-10-CM | POA: Diagnosis not present

## 2022-11-13 DIAGNOSIS — I4891 Unspecified atrial fibrillation: Secondary | ICD-10-CM | POA: Diagnosis not present

## 2022-11-13 DIAGNOSIS — M4726 Other spondylosis with radiculopathy, lumbar region: Secondary | ICD-10-CM | POA: Diagnosis not present

## 2022-11-13 DIAGNOSIS — M5116 Intervertebral disc disorders with radiculopathy, lumbar region: Secondary | ICD-10-CM | POA: Diagnosis not present

## 2022-11-13 DIAGNOSIS — Z4789 Encounter for other orthopedic aftercare: Secondary | ICD-10-CM | POA: Diagnosis not present

## 2022-11-13 DIAGNOSIS — M48062 Spinal stenosis, lumbar region with neurogenic claudication: Secondary | ICD-10-CM | POA: Diagnosis not present

## 2022-11-14 ENCOUNTER — Telehealth: Payer: Self-pay | Admitting: Medical-Surgical

## 2022-11-14 MED ORDER — GABAPENTIN 100 MG PO CAPS: ORAL_CAPSULE | ORAL | 0 refills | Status: DC

## 2022-11-14 NOTE — Telephone Encounter (Signed)
Contacted [name] to schedule their annual wellness visit. Patient declined to schedule AWV at this time.  [name] Patient Engineer, production II Direct Dial: [phone]

## 2022-11-14 NOTE — Telephone Encounter (Signed)
While speaking with patient on phone regarding AWV, patient would like to have a prescription refilled. Patient states she has been in pharmacy limbo and Dr. Lyn Hollingshead is no longer here to fill it. Patient requesting PCP fill gabapentin (NEURONTIN) 100 MG capsule. Please advise.

## 2022-11-15 ENCOUNTER — Telehealth: Payer: Self-pay | Admitting: Medical-Surgical

## 2022-11-15 DIAGNOSIS — M4316 Spondylolisthesis, lumbar region: Secondary | ICD-10-CM | POA: Diagnosis not present

## 2022-11-15 DIAGNOSIS — Z4789 Encounter for other orthopedic aftercare: Secondary | ICD-10-CM | POA: Diagnosis not present

## 2022-11-15 DIAGNOSIS — M5116 Intervertebral disc disorders with radiculopathy, lumbar region: Secondary | ICD-10-CM | POA: Diagnosis not present

## 2022-11-15 DIAGNOSIS — M4726 Other spondylosis with radiculopathy, lumbar region: Secondary | ICD-10-CM | POA: Diagnosis not present

## 2022-11-15 DIAGNOSIS — M48062 Spinal stenosis, lumbar region with neurogenic claudication: Secondary | ICD-10-CM | POA: Diagnosis not present

## 2022-11-15 DIAGNOSIS — I4891 Unspecified atrial fibrillation: Secondary | ICD-10-CM | POA: Diagnosis not present

## 2022-11-15 NOTE — Telephone Encounter (Signed)
Patient called about gabapentin 100mg  she missed your call and would like for you to call back [phone]

## 2022-11-16 ENCOUNTER — Other Ambulatory Visit: Payer: Self-pay

## 2022-11-16 MED ORDER — GABAPENTIN 100 MG PO CAPS: ORAL_CAPSULE | ORAL | 1 refills | Status: DC

## 2022-11-16 NOTE — Telephone Encounter (Signed)
Contacted patient and she requested to have a 90 day supply sent to [facility].  Sent 90 days with 1 Refill to [facility]

## 2022-11-16 NOTE — Telephone Encounter (Signed)
Patient called back requesting an update on her medication. Patient stated it was sent to the wrong pharmacy.  Please call back.

## 2022-11-17 ENCOUNTER — Telehealth: Payer: Self-pay

## 2022-11-17 DIAGNOSIS — M48062 Spinal stenosis, lumbar region with neurogenic claudication: Secondary | ICD-10-CM | POA: Diagnosis not present

## 2022-11-17 NOTE — Telephone Encounter (Signed)
Normal device function.  77 monitored VHR episodes.  EGMs c/w NSVT <20 beats, AF with RVR and SVT/re-enterant tachycardia.  38 Fast A&V episodes. Available EGMs show short V-A conduction, cannot exclude re-entrant tachycardia at 157-182 bpm. 5 AHR episodes, burden 0.5%.  Longest 1 hr 27 min 48 sec.  Cannot exclude AF vs atrial lead noise.  Not on OAC per Epic.  Sent to Triage for AF/RVR and tachycardia.    Spoke with patient and patients daughter informed them of AF seen on latest remote transmission. Patient and daughter agreeable to apt with AF clinic on 11/23/22 at 830, direct number to AF clinic given.

## 2022-11-21 DIAGNOSIS — M5116 Intervertebral disc disorders with radiculopathy, lumbar region: Secondary | ICD-10-CM | POA: Diagnosis not present

## 2022-11-21 DIAGNOSIS — M4316 Spondylolisthesis, lumbar region: Secondary | ICD-10-CM | POA: Diagnosis not present

## 2022-11-21 DIAGNOSIS — Z4789 Encounter for other orthopedic aftercare: Secondary | ICD-10-CM | POA: Diagnosis not present

## 2022-11-21 DIAGNOSIS — M4726 Other spondylosis with radiculopathy, lumbar region: Secondary | ICD-10-CM | POA: Diagnosis not present

## 2022-11-21 DIAGNOSIS — I4891 Unspecified atrial fibrillation: Secondary | ICD-10-CM | POA: Diagnosis not present

## 2022-11-21 DIAGNOSIS — M48062 Spinal stenosis, lumbar region with neurogenic claudication: Secondary | ICD-10-CM | POA: Diagnosis not present

## 2022-11-23 ENCOUNTER — Ambulatory Visit (HOSPITAL_COMMUNITY)
Admission: RE | Admit: 2022-11-23 | Discharge: 2022-11-23 | Disposition: A | Discharge status: Home / Self Care | Payer: Medicare Other | Source: Ambulatory Visit | Attending: Physician Assistant | Admitting: Physician Assistant

## 2022-11-23 VITALS — BP 148/70 | HR 75 | Ht 65.0 in | Wt 172.4 lb

## 2022-11-23 DIAGNOSIS — I471 Supraventricular tachycardia, unspecified: Secondary | ICD-10-CM | POA: Diagnosis not present

## 2022-11-23 DIAGNOSIS — D6869 Other thrombophilia: Secondary | ICD-10-CM | POA: Diagnosis not present

## 2022-11-23 DIAGNOSIS — R001 Bradycardia, unspecified: Secondary | ICD-10-CM | POA: Diagnosis not present

## 2022-11-23 DIAGNOSIS — Z7901 Long term (current) use of anticoagulants: Secondary | ICD-10-CM | POA: Diagnosis not present

## 2022-11-23 DIAGNOSIS — E039 Hypothyroidism, unspecified: Secondary | ICD-10-CM | POA: Insufficient documentation

## 2022-11-23 DIAGNOSIS — M5116 Intervertebral disc disorders with radiculopathy, lumbar region: Secondary | ICD-10-CM | POA: Diagnosis not present

## 2022-11-23 DIAGNOSIS — M4316 Spondylolisthesis, lumbar region: Secondary | ICD-10-CM | POA: Diagnosis not present

## 2022-11-23 DIAGNOSIS — I10 Essential (primary) hypertension: Secondary | ICD-10-CM | POA: Diagnosis not present

## 2022-11-23 DIAGNOSIS — Z950 Presence of cardiac pacemaker: Secondary | ICD-10-CM | POA: Insufficient documentation

## 2022-11-23 DIAGNOSIS — M4726 Other spondylosis with radiculopathy, lumbar region: Secondary | ICD-10-CM | POA: Diagnosis not present

## 2022-11-23 DIAGNOSIS — Z79899 Other long term (current) drug therapy: Secondary | ICD-10-CM | POA: Diagnosis not present

## 2022-11-23 DIAGNOSIS — I480 Paroxysmal atrial fibrillation: Secondary | ICD-10-CM | POA: Diagnosis not present

## 2022-11-23 DIAGNOSIS — Z8249 Family history of ischemic heart disease and other diseases of the circulatory system: Secondary | ICD-10-CM | POA: Diagnosis not present

## 2022-11-23 DIAGNOSIS — M48062 Spinal stenosis, lumbar region with neurogenic claudication: Secondary | ICD-10-CM | POA: Diagnosis not present

## 2022-11-23 DIAGNOSIS — I4891 Unspecified atrial fibrillation: Secondary | ICD-10-CM | POA: Diagnosis not present

## 2022-11-23 DIAGNOSIS — Z4789 Encounter for other orthopedic aftercare: Secondary | ICD-10-CM | POA: Diagnosis not present

## 2022-11-23 LAB — CBC
HCT: 39 % (ref 36.0–46.0)
Hemoglobin: 13 g/dL (ref 12.0–15.0)
MCH: 29.8 pg (ref 26.0–34.0)
MCHC: 33.3 g/dL (ref 30.0–36.0)
MCV: 89.4 fL (ref 80.0–100.0)
Platelets: 253 10*3/uL (ref 150–400)
RBC: 4.36 MIL/uL (ref 3.87–5.11)
RDW: 13.9 % (ref 11.5–15.5)
WBC: 4.4 10*3/uL (ref 4.0–10.5)
nRBC: 0 % (ref 0.0–0.2)

## 2022-11-23 LAB — BASIC METABOLIC PANEL
Anion gap: 9 (ref 5–15)
BUN: 12 mg/dL (ref 8–23)
CO2: 26 mmol/L (ref 22–32)
Calcium: 9.3 mg/dL (ref 8.9–10.3)
Chloride: 97 mmol/L — ABNORMAL LOW (ref 98–111)
Creatinine, Ser: 0.91 mg/dL (ref 0.44–1.00)
GFR, Estimated: >60 mL/min (ref >60)
Glucose, Bld: 100 mg/dL — ABNORMAL HIGH (ref 70–99)
Potassium: 3.7 mmol/L (ref 3.5–5.1)
Sodium: 132 mmol/L — ABNORMAL LOW (ref 135–145)

## 2022-11-23 MED ORDER — APIXABAN 5 MG PO TABS: 5.0000 mg | ORAL_TABLET | Freq: Two times a day (BID) | ORAL | 3 refills | Status: DC

## 2022-11-23 NOTE — Progress Notes (Signed)
Primary Care Physician: Christen Butter, NP Primary Cardiologist: Dr Jens Som Primary Electrophysiologist: Dr Elberta Fortis Referring Physician: Device clinic/Dr Eber Jones [name] is a 88 y.o. female with a history of HTN, HLD, hypothyroidism, SVT (AVNRT), NSVT, aortic stenosis, symptomatic bradycardia s/p PPM, atrial fibrillation who presents for follow up in the [facility]. The patient was initially diagnosed with atrial fibrillation remotely on her device but the episodes were so brief it was thought to be subclinical and anticoagulation was not started at that time. On 11/17/22, a remote device alert showed 5 afib episodes of afib with a burden of 0.5%, longest episode 1.5 hours. Patient has a CHADS2VASC score of 4.  On follow up today, patient reports that she feels well today. She did have a lot of palpitations following back surgery but this has improved with increasing her BB. She is in SR today. She denies alcohol use or snoring.   Today, she denies symptoms of chest pain, shortness of breath, orthopnea, PND, lower extremity edema, dizziness, presyncope, syncope, snoring, daytime somnolence, bleeding, or neurologic sequela. The patient is tolerating medications without difficulties and is otherwise without complaint today.    Atrial Fibrillation Risk Factors:  she does not have symptoms or diagnosis of sleep apnea. she does not have a history of rheumatic fever. she does not have a history of alcohol use. The patient does not have a history of early familial atrial fibrillation or other arrhythmias.  she has a BMI of Body mass index is 28.69 kg/m.Marland Kitchen Filed Weights   11/23/22 0831  Weight: 78.2 kg    Family History  Problem Relation Age of Onset   Skin cancer Mother    Stroke Mother    Prostate cancer Father    High blood pressure Father    Heart attack Father    Heart disease Other      Atrial Fibrillation Management history:  Previous  antiarrhythmic drugs: flecainide (did not tolerate) Previous cardioversions: none Previous ablations: none Anticoagulation history: none   Past Medical History:  Diagnosis Date   Acquired trigger finger 08/05/2013   Arthritis    Asthma    Atrial fibrillation (HCC)    paroxysmal , addressed  with metoprolol    Callus of foot 09/03/2018   Chronic sinusitis    Complication of anesthesia    Dyspnea    sob on exertion, reports at her pre-op today 09-13-2018 that her cardiologist has planned for her to do a ECHO for evaluation    Family history of adverse reaction to anesthesia    sister is hard to wake up from Anesthesia   Follicular lymphoma (HCC)    received Chemotherapy (2 years ago)  and radiation (last 12/2017)   Hyperlipidemia    Hypertension    Hypothyroidism    Meniere's disease    PONV (postoperative nausea and vomiting)    Primary localized osteoarthritis of left knee 09/24/2018   Primary localized osteoarthritis of right knee 06/18/2018   Reflux    Sebaceous cyst 09/03/2018   Shingles 10/24/2019   Past Surgical History:  Procedure Laterality Date   ABDOMINAL HYSTERECTOMY     BONE MARROW BIOPSY  06/16/2014   at [facility]   CARPAL TUNNEL RELEASE     bilateral   CHOLECYSTECTOMY     KNEE ARTHROSCOPY     PACEMAKER IMPLANT N/A 03/30/2020   Procedure: PACEMAKER IMPLANT;  Surgeon: Hillis Range, MD;  Location: [facility];  Service: Cardiovascular;  Laterality: N/A;   PARTIAL KNEE ARTHROPLASTY Right 06/18/2018   Procedure: UNICOMPARTMENTAL KNEE;  Surgeon: Teryl Lucy, MD;  Location: [facility] ORS;  Service: Orthopedics;  Laterality: Right;   PARTIAL KNEE ARTHROPLASTY Left 09/24/2018   Procedure: UNICOMPARTMENTAL KNEE;  Surgeon: Teryl Lucy, MD;  Location: [facility] ORS;  Service: Orthopedics;  Laterality: Left;    Current Outpatient Medications  Medication Sig Dispense Refill   acyclovir ointment (ZOVIRAX) 5 % Apply 1 Application topically daily as needed. 30 g 1    albuterol (VENTOLIN HFA) 108 (90 Base) MCG/ACT inhaler Inhale 2 puffs into the lungs every 6 (six) hours as needed for wheezing or shortness of breath. 8 g 0   amLODipine (NORVASC) 5 MG tablet Take 1 tablet (5 mg total) by mouth daily. 90 tablet 3   aspirin EC 81 MG tablet Take 81 mg by mouth daily.     baclofen (LIORESAL) 10 MG tablet TAKE 1 TABLET BY MOUTH 3  TIMES DAILY AS NEEDED FOR  MUSCLE SPASMS 270 tablet 0   Clobetasol Propionate 0.05 % shampoo Apply 1 application topically daily. As needed for scalp irritation from psoriasis 236 mL 3   conjugated estrogens (PREMARIN) vaginal cream Place 1 Applicatorful vaginally daily. 42.5 g 12   DULoxetine (CYMBALTA) 30 MG capsule Take 1 capsule (30 mg total) by mouth daily. 90 capsule 3   fluticasone (FLOVENT HFA) 110 MCG/ACT inhaler Inhale 2 puffs into the lungs 2 (two) times daily as needed (for shortness of breath or wheezing). 3 each 11   gabapentin (NEURONTIN) 100 MG capsule TAKE 1 CAPSULE BY MOUTH  TWICE DAILY AS NEEDED 180 capsule 1   hydrochlorothiazide (HYDRODIURIL) 12.5 MG tablet TAKE 1 TABLET BY MOUTH DAILY 90 tablet 3   levothyroxine (SYNTHROID) 112 MCG tablet TAKE 1 TABLET BY MOUTH DAILY  BEFORE BREAKFAST 90 tablet 3   meclizine (ANTIVERT) 25 MG tablet Take 1 tablet (25 mg total) by mouth 3 (three) times daily as needed for dizziness. 30 tablet 0   meloxicam (MOBIC) 15 MG tablet Take 15 mg by mouth daily as needed for pain.     metoprolol tartrate (LOPRESSOR) 100 MG tablet Take 100 mg by mouth 2 (two) times daily.     mometasone (ELOCON) 0.1 % cream Apply 1 Application topically daily. To vaginal area as needed for redness/irritation. Can use twice daily for the first week 100 g 1   omeprazole (PRILOSEC) 40 MG capsule TAKE 1 CAPSULE BY MOUTH IN  THE MORNING AND MAY REPEAT  DOSE IN THE EVENING AS  NEEDED FOR ACID REFLUX /  INDIGESTION 180 capsule 3   ondansetron (ZOFRAN) 4 MG tablet Take 1 tablet (4 mg total) by mouth every 8 (eight) hours as  needed for nausea or vomiting. 30 tablet 3   sennosides-docusate sodium (SENOKOT-S) 8.6-50 MG tablet Take 2 tablets by mouth daily. (Patient taking differently: Take 2 tablets by mouth as needed.) 30 tablet 1   tetrahydrozoline 0.05 % ophthalmic solution Place 2 drops into both eyes daily as needed (for dry eyes).     triamcinolone (NASACORT ALLERGY 24HR) 55 MCG/ACT AERO nasal inhaler Place 2 sprays into the nose daily. 3 each 3   valsartan (DIOVAN) 80 MG tablet TAKE 1 TABLET BY MOUTH DAILY 90 tablet 3   montelukast (SINGULAIR) 10 MG tablet Take 1 tablet (10 mg total) by mouth at bedtime. (Patient not taking: Reported on 11/23/2022) 90 tablet 3   No current facility-administered medications for this encounter.    No Known Allergies  Social History   Socioeconomic History   Marital status: Widowed    Spouse name: Not on file   Number of children: 1   Years of education: 27   Highest education level: 12th grade  Occupational History   Occupation: Advertising account planner    Comment: retired  Tobacco Use   Smoking status: Never   Smokeless tobacco: Never  Vaping Use   Vaping Use: Never used  Substance and Sexual Activity   Alcohol use: No   Drug use: No   Sexual activity: Not Currently    Partners: Male  Other Topics Concern   Not on file  Social History Narrative   No exercise. 2 cups of coffee a day    Social Determinants of Health   Financial Resource Strain: Low Risk  (10/11/2022)   Overall Financial Resource Strain (CARDIA)    Difficulty of Paying Living Expenses: Not hard at all  Food Insecurity: No Food Insecurity (10/11/2022)   Hunger Vital Sign    Worried About Running Out of Food in the Last Year: Never true    Ran Out of Food in the Last Year: Never true  Transportation Needs: No Transportation Needs (10/11/2022)   PRAPARE - Administrator, Civil Service (Medical): No    Lack of Transportation (Non-Medical): No  Physical Activity: Unknown (10/11/2022)   Exercise  Vital Sign    Days of Exercise per Week: 0 days    Minutes of Exercise per Session: Not on file  Stress: No Stress Concern Present (10/11/2022)   [employer] of Occupational Health - Occupational Stress Questionnaire    Feeling of Stress : Only a little  Social Connections: Moderately Integrated (10/11/2022)   Social Connection and Isolation Panel [NHANES]    Frequency of Communication with Friends and Family: More than three times a week    Frequency of Social Gatherings with Friends and Family: Once a week    Attends Religious Services: More than 4 times per year    Active Member of [employer] or Organizations: Yes    Attends Banker Meetings: More than 4 times per year    Marital Status: Widowed  Intimate Partner Violence: Not At Risk (03/10/2019)   Humiliation, Afraid, Rape, and Kick questionnaire    Fear of Current or Ex-Partner: No    Emotionally Abused: No    Physically Abused: No    Sexually Abused: No     ROS- All systems are reviewed and negative except as per the HPI above.  Physical Exam: Vitals:   11/23/22 0831  BP: (!) 148/70  Pulse: 75  Weight: 78.2 kg  Height: 5\' 5"  (1.651 m)    GEN- The patient is a well appearing female, alert and oriented x 3 today.   Head- normocephalic, atraumatic Eyes-  Sclera clear, conjunctiva pink Ears- hearing intact Oropharynx- clear Neck- supple  Lungs- Clear to ausculation bilaterally, normal work of breathing Heart- Regular rate and rhythm, no rubs or gallops, 3/6 systolic murmur GI- soft, NT, ND, + BS Extremities- no clubbing, cyanosis, or edema MS- no significant deformity or atrophy Skin- no rash or lesion Psych- euthymic mood, full affect Neuro- strength and sensation are intact  Wt Readings from Last 3 Encounters:  11/23/22 78.2 kg  10/12/22 84 kg  10/11/22 83 kg    EKG today demonstrates  SR, PVC, 1st degree AV block, RBBB Vent. rate 75 BPM PR interval 228 ms QRS duration 134 ms QT/QTcB 428/477  ms  Echo 10/19/22 demonstrated  1. Left ventricular ejection fraction, by estimation, is 55 to 60%. The  left ventricle has normal function. The left ventricle has no regional  wall motion abnormalities. There is mild concentric left ventricular  hypertrophy. Left ventricular diastolic parameters are consistent with Grade I diastolic dysfunction (impaired relaxation). The average left ventricular global longitudinal strain is -14.5 %. The global longitudinal strain is abnormal.   2. Right ventricular systolic function is normal. The right ventricular  size is normal. There is normal pulmonary artery systolic pressure. The  estimated right ventricular systolic pressure is 16.8 mmHg.   3. Mild mitral valve regurgitation.   4. The aortic valve is calcified. Aortic valve regurgitation is mild.  Moderate aortic valve stenosis.   5. The inferior vena cava is normal in size with greater than 50%  respiratory variability, suggesting right atrial pressure of 3 mmHg.   Comparison(s): No significant change from prior study.   Epic records are reviewed at length today.  CHA2DS2-VASc Score = 4  The patient's score is based upon: CHF History: 0 HTN History: 1 Diabetes History: 0 Stroke History: 0 Vascular Disease History: 0 Age Score: 2 Gender Score: 1       ASSESSMENT AND PLAN: 1. Paroxysmal Atrial Fibrillation (ICD10:  I48.0) The patient's CHA2DS2-VASc score is 4, indicating a 4.8% annual risk of stroke.   General education about afib provided and questions answered. We also discussed her stroke risk and the risks and benefits of anticoagulation. Will stop ASA and start Eliquis 5 mg BID (weight >60 kg, Cr < 1.5) Check bmet/cbc today.  Continue Lopressor 100 mg BID Will continue to monitor AF burden on device. Can consider alternate AAD if she has an increase in her burden.   2. Secondary Hypercoagulable State (ICD10:  D68.69) The patient is at significant risk for stroke/thromboembolism  based upon her CHA2DS2-VASc Score of 4.  Continue Apixaban (Eliquis).   3. Symptomatic bradycardia S/p PPM, followed by Dr Elberta Fortis and the device clinic.  4. SVT AVNRT, declined ablation previously On metoprolol   5. HTN Stable, no changes today.  6. VHD Moderate AS Followed by Dr Jens Som with serial echos   Follow up in the AF clinic in one month.    Jorja Loa PA-C Afib Clinic [facility] [address] [place], [place] [address] [phone] 11/23/2022 9:19 AM

## 2022-11-23 NOTE — Patient Instructions (Signed)
Stop aspirin  Start Eliquis 5mg twice a day 

## 2022-11-24 ENCOUNTER — Ambulatory Visit (INDEPENDENT_AMBULATORY_CARE_PROVIDER_SITE_OTHER): Payer: Medicare Other | Admitting: Medical-Surgical

## 2022-11-24 ENCOUNTER — Encounter: Payer: Self-pay | Admitting: Medical-Surgical

## 2022-11-24 VITALS — BP 135/74 | HR 74 | Resp 20 | Ht 65.0 in | Wt 173.1 lb

## 2022-11-24 DIAGNOSIS — M48062 Spinal stenosis, lumbar region with neurogenic claudication: Secondary | ICD-10-CM | POA: Diagnosis not present

## 2022-11-24 DIAGNOSIS — C8290 Follicular lymphoma, unspecified, unspecified site: Secondary | ICD-10-CM

## 2022-11-24 DIAGNOSIS — H16229 Keratoconjunctivitis sicca, not specified as Sjogren's, unspecified eye: Secondary | ICD-10-CM

## 2022-11-24 DIAGNOSIS — M2550 Pain in unspecified joint: Secondary | ICD-10-CM | POA: Diagnosis not present

## 2022-11-24 DIAGNOSIS — M3501 Sicca syndrome with keratoconjunctivitis: Secondary | ICD-10-CM

## 2022-11-24 DIAGNOSIS — M4316 Spondylolisthesis, lumbar region: Secondary | ICD-10-CM | POA: Diagnosis not present

## 2022-11-24 DIAGNOSIS — M4726 Other spondylosis with radiculopathy, lumbar region: Secondary | ICD-10-CM | POA: Diagnosis not present

## 2022-11-24 DIAGNOSIS — I4891 Unspecified atrial fibrillation: Secondary | ICD-10-CM | POA: Diagnosis not present

## 2022-11-24 DIAGNOSIS — M791 Myalgia, unspecified site: Secondary | ICD-10-CM

## 2022-11-24 DIAGNOSIS — Z4789 Encounter for other orthopedic aftercare: Secondary | ICD-10-CM | POA: Diagnosis not present

## 2022-11-24 DIAGNOSIS — M5116 Intervertebral disc disorders with radiculopathy, lumbar region: Secondary | ICD-10-CM | POA: Diagnosis not present

## 2022-11-24 MED ORDER — ONDANSETRON HCL 4 MG PO TABS: 4.0000 mg | ORAL_TABLET | Freq: Three times a day (TID) | ORAL | 3 refills | Status: DC | PRN

## 2022-11-24 NOTE — Progress Notes (Signed)
        Established patient visit  History, exam, impression, and plan:  1. Myalgia 2. Polyarthralgia Pleasant 88 year old female accompanied by her daughter presenting for follow-up on arthralgias/myalgias.  She was finally able to get started on Cymbalta 30 mg daily and has been taking the medication as prescribed, tolerating well without side effects.  Does note that she gets a little sleepy at times but is also on gabapentin and is not sure which medication is causing it.  Does not feel too groggy and has noted that her myalgias and arthralgias have greatly improved.  Previously unable to completely flex her left hand middle finger but this has resolved and she is able to make a fist without difficulty.  Currently happy with her medication and would like to avoid making changes for now.  Continue Cymbalta as prescribed.  3. Keratitis sicca (HCC) Managed by dermatology.  4. Follicular lymphoma, unspecified grade, unspecified body region [facility]) Managed by oncology.  Procedures performed this visit: None.  Return in about 3 months (around 02/24/2023) for chronic disease follow up.  __________________________________ Thayer Ohm, DNP, APRN, FNP-BC Primary Care and Sports Medicine [facility]

## 2022-11-25 DIAGNOSIS — Z950 Presence of cardiac pacemaker: Secondary | ICD-10-CM | POA: Diagnosis not present

## 2022-11-25 DIAGNOSIS — Z8572 Personal history of non-Hodgkin lymphomas: Secondary | ICD-10-CM | POA: Diagnosis not present

## 2022-11-25 DIAGNOSIS — Z981 Arthrodesis status: Secondary | ICD-10-CM | POA: Diagnosis not present

## 2022-11-25 DIAGNOSIS — M4316 Spondylolisthesis, lumbar region: Secondary | ICD-10-CM | POA: Diagnosis not present

## 2022-11-25 DIAGNOSIS — M48062 Spinal stenosis, lumbar region with neurogenic claudication: Secondary | ICD-10-CM | POA: Diagnosis not present

## 2022-11-25 DIAGNOSIS — E785 Hyperlipidemia, unspecified: Secondary | ICD-10-CM | POA: Diagnosis not present

## 2022-11-25 DIAGNOSIS — M5116 Intervertebral disc disorders with radiculopathy, lumbar region: Secondary | ICD-10-CM | POA: Diagnosis not present

## 2022-11-25 DIAGNOSIS — H9192 Unspecified hearing loss, left ear: Secondary | ICD-10-CM | POA: Diagnosis not present

## 2022-11-25 DIAGNOSIS — M4726 Other spondylosis with radiculopathy, lumbar region: Secondary | ICD-10-CM | POA: Diagnosis not present

## 2022-11-25 DIAGNOSIS — Z7982 Long term (current) use of aspirin: Secondary | ICD-10-CM | POA: Diagnosis not present

## 2022-11-25 DIAGNOSIS — Z9181 History of falling: Secondary | ICD-10-CM | POA: Diagnosis not present

## 2022-11-25 DIAGNOSIS — Z4789 Encounter for other orthopedic aftercare: Secondary | ICD-10-CM | POA: Diagnosis not present

## 2022-11-25 DIAGNOSIS — I4891 Unspecified atrial fibrillation: Secondary | ICD-10-CM | POA: Diagnosis not present

## 2022-11-25 DIAGNOSIS — I10 Essential (primary) hypertension: Secondary | ICD-10-CM | POA: Diagnosis not present

## 2022-11-27 ENCOUNTER — Other Ambulatory Visit (HOSPITAL_COMMUNITY): Payer: Self-pay | Admitting: *Deleted

## 2022-11-27 DIAGNOSIS — Z4789 Encounter for other orthopedic aftercare: Secondary | ICD-10-CM | POA: Diagnosis not present

## 2022-11-27 DIAGNOSIS — I4891 Unspecified atrial fibrillation: Secondary | ICD-10-CM | POA: Diagnosis not present

## 2022-11-27 DIAGNOSIS — M4316 Spondylolisthesis, lumbar region: Secondary | ICD-10-CM | POA: Diagnosis not present

## 2022-11-27 DIAGNOSIS — M5116 Intervertebral disc disorders with radiculopathy, lumbar region: Secondary | ICD-10-CM | POA: Diagnosis not present

## 2022-11-27 DIAGNOSIS — M4726 Other spondylosis with radiculopathy, lumbar region: Secondary | ICD-10-CM | POA: Diagnosis not present

## 2022-11-27 DIAGNOSIS — M48062 Spinal stenosis, lumbar region with neurogenic claudication: Secondary | ICD-10-CM | POA: Diagnosis not present

## 2022-11-27 MED ORDER — APIXABAN 5 MG PO TABS: 5.0000 mg | ORAL_TABLET | Freq: Two times a day (BID) | ORAL | 1 refills | Status: DC

## 2022-11-29 DIAGNOSIS — M4316 Spondylolisthesis, lumbar region: Secondary | ICD-10-CM | POA: Diagnosis not present

## 2022-11-29 DIAGNOSIS — M48062 Spinal stenosis, lumbar region with neurogenic claudication: Secondary | ICD-10-CM | POA: Diagnosis not present

## 2022-11-29 DIAGNOSIS — I4891 Unspecified atrial fibrillation: Secondary | ICD-10-CM | POA: Diagnosis not present

## 2022-11-29 DIAGNOSIS — Z4789 Encounter for other orthopedic aftercare: Secondary | ICD-10-CM | POA: Diagnosis not present

## 2022-11-29 DIAGNOSIS — M5116 Intervertebral disc disorders with radiculopathy, lumbar region: Secondary | ICD-10-CM | POA: Diagnosis not present

## 2022-11-29 DIAGNOSIS — M4726 Other spondylosis with radiculopathy, lumbar region: Secondary | ICD-10-CM | POA: Diagnosis not present

## 2022-12-01 ENCOUNTER — Other Ambulatory Visit (HOSPITAL_COMMUNITY): Payer: Self-pay | Admitting: *Deleted

## 2022-12-01 MED ORDER — APIXABAN 5 MG PO TABS: 5.0000 mg | ORAL_TABLET | Freq: Two times a day (BID) | ORAL | 3 refills | Status: DC

## 2022-12-06 ENCOUNTER — Telehealth: Payer: Self-pay | Admitting: Cardiology

## 2022-12-06 MED ORDER — METOPROLOL TARTRATE 100 MG PO TABS: 100.0000 mg | ORAL_TABLET | Freq: Two times a day (BID) | ORAL | 2 refills | Status: DC

## 2022-12-06 NOTE — Telephone Encounter (Signed)
Pt's medication was sent to pt's pharmacy as requested. Confirmation received.  °

## 2022-12-06 NOTE — Telephone Encounter (Signed)
*  STAT* If patient is at the pharmacy, call can be transferred to refill team.   1. Which medications need to be refilled? (please list name of each medication and dose if known)   metoprolol tartrate (LOPRESSOR) 100 MG tablet    2. Which pharmacy/location (including street and city if local pharmacy) is medication to be sent to? [facility] - [place], [place] - [address] [place]   3. Do they need a 30 day or 90 day supply? 90 day

## 2022-12-11 ENCOUNTER — Telehealth: Payer: Self-pay | Admitting: Medical-Surgical

## 2022-12-11 MED ORDER — ONDANSETRON HCL 4 MG PO TABS: 4.0000 mg | ORAL_TABLET | Freq: Three times a day (TID) | ORAL | 3 refills | Status: AC | PRN

## 2022-12-11 NOTE — Telephone Encounter (Signed)
Medication sent.

## 2022-12-11 NOTE — Telephone Encounter (Signed)
Patient called requesting a refill of ;  Ondansetron (ZOFRAN) 4mg   Pharmacy ; [facility]

## 2022-12-15 DIAGNOSIS — Z79891 Long term (current) use of opiate analgesic: Secondary | ICD-10-CM | POA: Diagnosis not present

## 2022-12-15 DIAGNOSIS — G894 Chronic pain syndrome: Secondary | ICD-10-CM | POA: Diagnosis not present

## 2022-12-21 ENCOUNTER — Ambulatory Visit (HOSPITAL_COMMUNITY): Payer: Medicare Other | Admitting: Physician Assistant

## 2022-12-25 ENCOUNTER — Other Ambulatory Visit: Payer: Self-pay | Admitting: Cardiology

## 2022-12-27 ENCOUNTER — Ambulatory Visit (INDEPENDENT_AMBULATORY_CARE_PROVIDER_SITE_OTHER): Payer: Medicare Other

## 2022-12-27 DIAGNOSIS — I441 Atrioventricular block, second degree: Secondary | ICD-10-CM

## 2022-12-27 DIAGNOSIS — I480 Paroxysmal atrial fibrillation: Secondary | ICD-10-CM

## 2022-12-28 ENCOUNTER — Telehealth: Payer: Self-pay

## 2022-12-28 LAB — CUP PACEART REMOTE DEVICE CHECK
Battery Remaining Longevity: 149 mo
Battery Voltage: 3.02 V
Brady Statistic AP VP Percent: 0.07 %
Brady Statistic AP VS Percent: 14.25 %
Brady Statistic AS VP Percent: 0.04 %
Brady Statistic AS VS Percent: 85.63 %
Brady Statistic RA Percent Paced: 15.16 %
Brady Statistic RV Percent Paced: 0.12 %
Date Time Interrogation Session: 20240619021808
Implantable Lead Connection Status: 753985
Implantable Lead Connection Status: 753985
Implantable Lead Implant Date: 20210921
Implantable Lead Implant Date: 20210921
Implantable Lead Location: 753859
Implantable Lead Location: 753860
Implantable Lead Model: 3830
Implantable Lead Model: 5076
Implantable Pulse Generator Implant Date: 20210921
Lead Channel Impedance Value: 323 Ohm
Lead Channel Impedance Value: 380 Ohm
Lead Channel Impedance Value: 380 Ohm
Lead Channel Impedance Value: 513 Ohm
Lead Channel Pacing Threshold Amplitude: 0.5 V
Lead Channel Pacing Threshold Amplitude: 0.875 V
Lead Channel Pacing Threshold Pulse Width: 0.4 ms
Lead Channel Pacing Threshold Pulse Width: 0.4 ms
Lead Channel Sensing Intrinsic Amplitude: 16.875 mV
Lead Channel Sensing Intrinsic Amplitude: 16.875 mV
Lead Channel Sensing Intrinsic Amplitude: 2.5 mV
Lead Channel Sensing Intrinsic Amplitude: 2.5 mV
Lead Channel Setting Pacing Amplitude: 1.5 V
Lead Channel Setting Pacing Amplitude: 2 V
Lead Channel Setting Pacing Pulse Width: 0.4 ms
Lead Channel Setting Sensing Sensitivity: 0.9 mV
Zone Setting Status: 755011

## 2022-12-28 NOTE — Telephone Encounter (Signed)
Scheduled remote reviewed. Normal device function.    There were 2 short NSVT arrhythmias detected, there were 3 fast A&V events detected, ? AT or VT, longest is 2 minutes. sent to triage. Next remote 91 days. Hassell Halim, RN, CCDS, [employer]   NOTE:  Patient has follow up appt with Jorja Loa, PA tomorrow.  Will also FYI to Dr. Elberta Fortis for review.    PRESENTING EGM:

## 2022-12-29 ENCOUNTER — Ambulatory Visit (HOSPITAL_COMMUNITY)
Admission: RE | Admit: 2022-12-29 | Discharge: 2022-12-29 | Disposition: A | Discharge status: Home / Self Care | Payer: Medicare Other | Source: Ambulatory Visit | Attending: Physician Assistant | Admitting: Physician Assistant

## 2022-12-29 VITALS — BP 150/68 | HR 63 | Ht 65.0 in | Wt 172.4 lb

## 2022-12-29 DIAGNOSIS — Z79899 Other long term (current) drug therapy: Secondary | ICD-10-CM | POA: Insufficient documentation

## 2022-12-29 DIAGNOSIS — Z950 Presence of cardiac pacemaker: Secondary | ICD-10-CM | POA: Diagnosis not present

## 2022-12-29 DIAGNOSIS — I080 Rheumatic disorders of both mitral and aortic valves: Secondary | ICD-10-CM | POA: Diagnosis not present

## 2022-12-29 DIAGNOSIS — I480 Paroxysmal atrial fibrillation: Secondary | ICD-10-CM | POA: Diagnosis not present

## 2022-12-29 DIAGNOSIS — Z7901 Long term (current) use of anticoagulants: Secondary | ICD-10-CM | POA: Diagnosis not present

## 2022-12-29 DIAGNOSIS — I471 Supraventricular tachycardia, unspecified: Secondary | ICD-10-CM | POA: Insufficient documentation

## 2022-12-29 DIAGNOSIS — I10 Essential (primary) hypertension: Secondary | ICD-10-CM | POA: Diagnosis not present

## 2022-12-29 DIAGNOSIS — D6869 Other thrombophilia: Secondary | ICD-10-CM | POA: Diagnosis not present

## 2022-12-29 DIAGNOSIS — E039 Hypothyroidism, unspecified: Secondary | ICD-10-CM | POA: Diagnosis not present

## 2022-12-29 DIAGNOSIS — E785 Hyperlipidemia, unspecified: Secondary | ICD-10-CM | POA: Insufficient documentation

## 2022-12-29 LAB — CBC
HCT: 39.7 % (ref 36.0–46.0)
Hemoglobin: 13.4 g/dL (ref 12.0–15.0)
MCH: 30.2 pg (ref 26.0–34.0)
MCHC: 33.8 g/dL (ref 30.0–36.0)
MCV: 89.4 fL (ref 80.0–100.0)
Platelets: 258 10*3/uL (ref 150–400)
RBC: 4.44 MIL/uL (ref 3.87–5.11)
RDW: 12.7 % (ref 11.5–15.5)
WBC: 7.6 10*3/uL (ref 4.0–10.5)
nRBC: 0 % (ref 0.0–0.2)

## 2022-12-29 LAB — BASIC METABOLIC PANEL
Anion gap: 9 (ref 5–15)
BUN: 16 mg/dL (ref 8–23)
CO2: 27 mmol/L (ref 22–32)
Calcium: 9 mg/dL (ref 8.9–10.3)
Chloride: 96 mmol/L — ABNORMAL LOW (ref 98–111)
Creatinine, Ser: 0.94 mg/dL (ref 0.44–1.00)
GFR, Estimated: 59 mL/min — ABNORMAL LOW (ref >60)
Glucose, Bld: 102 mg/dL — ABNORMAL HIGH (ref 70–99)
Potassium: 4.2 mmol/L (ref 3.5–5.1)
Sodium: 132 mmol/L — ABNORMAL LOW (ref 135–145)

## 2022-12-29 MED ORDER — DILTIAZEM HCL ER COATED BEADS 180 MG PO CP24: 180.0000 mg | ORAL_CAPSULE | Freq: Every day | ORAL | 1 refills | Status: DC

## 2022-12-29 NOTE — Telephone Encounter (Signed)
Patient advised to continue Amlodipine until she is able to start Diltiazem 180 mg once daily. Once she receives her medication she was told to stop her Amlodipine medication. Communicated with patient's daughter and she verbalized understanding.

## 2022-12-29 NOTE — Progress Notes (Signed)
Primary Care Physician: Christen Butter, NP Primary Cardiologist: Dr Jens Som Primary Electrophysiologist: Dr Elberta Fortis Referring Physician: Device clinic/Dr Eber Jones [name] is a 88 y.o. female with a history of HTN, HLD, hypothyroidism, SVT (AVNRT), NSVT, aortic stenosis, symptomatic bradycardia s/p PPM, atrial fibrillation who presents for follow up in the [facility]. The patient was initially diagnosed with atrial fibrillation remotely on her device but the episodes were so brief it was thought to be subclinical and anticoagulation was not started at that time. On 11/17/22, a remote device alert showed 5 afib episodes of afib with a burden of 0.5%, longest episode 1.5 hours. Patient has a CHADS2VASC score of 4.  On follow up today, patient reports that she has done well since her last visit. Her PPM shows <0.1% afib burden. She denies any bleeding issues since starting Eliquis.   Today, she denies symptoms of palpitations, chest pain, shortness of breath, orthopnea, PND, lower extremity edema, dizziness, presyncope, syncope, snoring, daytime somnolence, bleeding, or neurologic sequela. The patient is tolerating medications without difficulties and is otherwise without complaint today.    Atrial Fibrillation Risk Factors:  she does not have symptoms or diagnosis of sleep apnea. she does not have a history of rheumatic fever. she does not have a history of alcohol use. The patient does not have a history of early familial atrial fibrillation or other arrhythmias.   Atrial Fibrillation Management history:  Previous antiarrhythmic drugs: flecainide (did not tolerate) Previous cardioversions: none Previous ablations: none Anticoagulation history: Eliquis   Past Medical History:  Diagnosis Date   Acquired trigger finger 08/05/2013   Arthritis    Asthma    Atrial fibrillation (HCC)    paroxysmal , addressed  with metoprolol    Callus of foot  09/03/2018   Chronic sinusitis    Complication of anesthesia    Dyspnea    sob on exertion, reports at her pre-op today 09-13-2018 that her cardiologist has planned for her to do a ECHO for evaluation    Family history of adverse reaction to anesthesia    sister is hard to wake up from Anesthesia   Follicular lymphoma (HCC)    received Chemotherapy (2 years ago)  and radiation (last 12/2017)   Hyperlipidemia    Hypertension    Hypothyroidism    Meniere's disease    PONV (postoperative nausea and vomiting)    Primary localized osteoarthritis of left knee 09/24/2018   Primary localized osteoarthritis of right knee 06/18/2018   Reflux    Sebaceous cyst 09/03/2018   Shingles 10/24/2019    ROS- All systems are reviewed and negative except as per the HPI above.  Physical Exam: Vitals:   12/29/22 1059  BP: (!) 150/68  Pulse: 63  Weight: 78.2 kg  Height: 5\' 5"  (1.651 m)     GEN: Well nourished, well developed in no acute distress NECK: No JVD; No carotid bruits CARDIAC: Regular rate and rhythm, no rubs, gallops, 3/6 systolic murmur RESPIRATORY:  Clear to auscultation without rales, wheezing or rhonchi  ABDOMEN: Soft, non-tender, non-distended EXTREMITIES:  No edema; No deformity    Wt Readings from Last 3 Encounters:  12/29/22 78.2 kg  11/24/22 78.5 kg  11/23/22 78.2 kg    EKG today demonstrates  SR with intermittent A pacing, RBBB Vent. rate 63 BPM PR interval 234 ms QRS duration 134 ms QT/QTcB 474/485 ms  Echo 10/19/22 demonstrated  1. Left ventricular ejection fraction, by estimation, is 55  to 60%. The  left ventricle has normal function. The left ventricle has no regional  wall motion abnormalities. There is mild concentric left ventricular  hypertrophy. Left ventricular diastolic parameters are consistent with Grade I diastolic dysfunction (impaired relaxation). The average left ventricular global longitudinal strain is -14.5 %. The global longitudinal strain is  abnormal.   2. Right ventricular systolic function is normal. The right ventricular  size is normal. There is normal pulmonary artery systolic pressure. The  estimated right ventricular systolic pressure is 16.8 mmHg.   3. Mild mitral valve regurgitation.   4. The aortic valve is calcified. Aortic valve regurgitation is mild.  Moderate aortic valve stenosis.   5. The inferior vena cava is normal in size with greater than 50%  respiratory variability, suggesting right atrial pressure of 3 mmHg.   Comparison(s): No significant change from prior study.   Epic records are reviewed at length today.  CHA2DS2-VASc Score = 4  The patient's score is based upon: CHF History: 0 HTN History: 1 Diabetes History: 0 Stroke History: 0 Vascular Disease History: 0 Age Score: 2 Gender Score: 1       ASSESSMENT AND PLAN: Paroxysmal Atrial Fibrillation (ICD10:  I48.0) The patient's CHA2DS2-VASc score is 4, indicating a 4.8% annual risk of stroke.   PPM shows < 0.1% afib burden Continue Eliquis 5 mg BID (weight >60 kg, Cr < 1.5) Check bmet/cbc today.  Continue Lopressor 100 mg BID  Secondary Hypercoagulable State (ICD10:  D68.69) The patient is at significant risk for stroke/thromboembolism based upon her CHA2DS2-VASc Score of 4.  Continue Apixaban (Eliquis).   Symptomatic bradycardia S/p PPM, followed by Dr Elberta Fortis and the device clinic  SVT AVNRT, declined ablation previously On metoprolol   HTN Stable on current regimen  VHD Moderate AS Followed by Dr Jens Som    Follow up with Dr Jens Som and Dr Elberta Fortis as scheduled.    Jorja Loa PA-C Afib Clinic [facility] [address] [place], [place] [address] [phone] 12/29/2022 12:21 PM

## 2023-01-04 DIAGNOSIS — J328 Other chronic sinusitis: Secondary | ICD-10-CM | POA: Diagnosis not present

## 2023-01-04 DIAGNOSIS — Z9889 Other specified postprocedural states: Secondary | ICD-10-CM | POA: Diagnosis not present

## 2023-01-04 DIAGNOSIS — R0982 Postnasal drip: Secondary | ICD-10-CM | POA: Diagnosis not present

## 2023-01-04 DIAGNOSIS — J339 Nasal polyp, unspecified: Secondary | ICD-10-CM | POA: Diagnosis not present

## 2023-01-04 DIAGNOSIS — J300 Vasomotor rhinitis: Secondary | ICD-10-CM | POA: Diagnosis not present

## 2023-01-25 ENCOUNTER — Encounter: Payer: Self-pay | Admitting: Medical-Surgical

## 2023-01-25 ENCOUNTER — Ambulatory Visit (INDEPENDENT_AMBULATORY_CARE_PROVIDER_SITE_OTHER): Payer: Medicare Other | Admitting: Medical-Surgical

## 2023-01-25 VITALS — BP 155/77 | HR 62 | Resp 18 | Ht 65.0 in | Wt 172.2 lb

## 2023-01-25 DIAGNOSIS — T148XXA Other injury of unspecified body region, initial encounter: Secondary | ICD-10-CM

## 2023-01-25 MED ORDER — CEPHALEXIN 500 MG PO CAPS: 500.0000 mg | ORAL_CAPSULE | Freq: Three times a day (TID) | ORAL | 0 refills | Status: DC

## 2023-01-25 NOTE — Progress Notes (Signed)
        Established patient visit  History, exam, impression, and plan:  1. Multiple skin tears Pleasant 88 year old female presenting today for evaluation of right arm and hand injury that occurred about 4 to 5 days ago.  Notes that she was in the pantry and ended up scraping her right hand and arm on the door jam she sustained 2 large skin tears and 1 small 1 on the dorsal hand and distal forearm they have been doing site care at home trying to keep it clean and moist to avoid drying out.  They were able to replace the skin flap on the forearm and it has scabbed over.  Unfortunately, they were unable to replace the skin flap over the dorsal hand and the ulnar prominence.  Over the last couple of days, the area has become swollen and erythematous.  Although no purulent drainage, the redness and swelling is concerning for cellulitis.  Treating with Keflex 500 mg 3 times daily x 7 days.  Reviewed recommendations for site care.  Dressed with Xeroform gauze to the 2 open areas, secured in place with thin rolled gauze and Coban.  Extra supplies sent home with patient.  Return precautions reviewed.  Procedures performed this visit: None.  Return if symptoms worsen or fail to improve.  __________________________________ Thayer Ohm, DNP, APRN, FNP-BC Primary Care and Sports Medicine [facility] [place]

## 2023-02-08 DIAGNOSIS — Z79899 Other long term (current) drug therapy: Secondary | ICD-10-CM | POA: Diagnosis not present

## 2023-02-08 DIAGNOSIS — H35363 Drusen (degenerative) of macula, bilateral: Secondary | ICD-10-CM | POA: Diagnosis not present

## 2023-02-16 ENCOUNTER — Other Ambulatory Visit (HOSPITAL_COMMUNITY): Payer: Self-pay | Admitting: *Deleted

## 2023-02-16 MED ORDER — APIXABAN 5 MG PO TABS: 5.0000 mg | ORAL_TABLET | Freq: Two times a day (BID) | ORAL | 2 refills | Status: DC

## 2023-02-21 DIAGNOSIS — M48062 Spinal stenosis, lumbar region with neurogenic claudication: Secondary | ICD-10-CM | POA: Diagnosis not present

## 2023-02-21 DIAGNOSIS — Z6830 Body mass index (BMI) 30.0-30.9, adult: Secondary | ICD-10-CM | POA: Diagnosis not present

## 2023-02-22 DIAGNOSIS — M79661 Pain in right lower leg: Secondary | ICD-10-CM | POA: Diagnosis not present

## 2023-03-01 ENCOUNTER — Encounter: Payer: Self-pay | Admitting: Medical-Surgical

## 2023-03-01 ENCOUNTER — Ambulatory Visit (INDEPENDENT_AMBULATORY_CARE_PROVIDER_SITE_OTHER): Payer: Medicare Other | Admitting: Medical-Surgical

## 2023-03-01 VITALS — BP 151/88 | HR 66 | Ht 65.0 in | Wt 177.1 lb

## 2023-03-01 DIAGNOSIS — H43821 Vitreomacular adhesion, right eye: Secondary | ICD-10-CM | POA: Diagnosis not present

## 2023-03-01 DIAGNOSIS — M2550 Pain in unspecified joint: Secondary | ICD-10-CM

## 2023-03-01 DIAGNOSIS — Z7901 Long term (current) use of anticoagulants: Secondary | ICD-10-CM | POA: Diagnosis not present

## 2023-03-01 DIAGNOSIS — Z961 Presence of intraocular lens: Secondary | ICD-10-CM | POA: Diagnosis not present

## 2023-03-01 DIAGNOSIS — D3131 Benign neoplasm of right choroid: Secondary | ICD-10-CM | POA: Diagnosis not present

## 2023-03-01 DIAGNOSIS — H353132 Nonexudative age-related macular degeneration, bilateral, intermediate dry stage: Secondary | ICD-10-CM | POA: Diagnosis not present

## 2023-03-01 DIAGNOSIS — H35341 Macular cyst, hole, or pseudohole, right eye: Secondary | ICD-10-CM | POA: Diagnosis not present

## 2023-03-01 MED ORDER — DULOXETINE HCL 60 MG PO CPEP: 60.0000 mg | ORAL_CAPSULE | Freq: Every day | ORAL | 1 refills | Status: DC

## 2023-03-01 NOTE — Progress Notes (Signed)
        Established patient visit  History, exam, impression, and plan:  1. Polyarthralgia Pleasant 88 year old female presenting today for follow-up on polyarthralgia.  She is followed by rheumatology however has been having some significant issues with multiple aching joints that are starting to interfere with her daily activity.  Is currently being treated with Cymbalta 30 mg daily as well as gabapentin 100 mg twice daily as needed.  Has also restarted meloxicam 15 mg daily.  Tolerating all medications well without side effects.  Most recent slight of concern is her bilateral hands.  Continue gabapentin and meloxicam as prescribed.  Increasing Cymbalta to 60 mg daily.  We will touch base in about 4 weeks to see if this is well-tolerated and evaluate the effect.   Procedures performed this visit: None.  Return in about 6 months (around 09/01/2023) for chronic disease follow up.  __________________________________ Thayer Ohm, DNP, APRN, FNP-BC Primary Care and Sports Medicine [facility] [place]

## 2023-03-05 NOTE — Progress Notes (Signed)
HPI: Follow-up AS/AI, paroxysmal atrial fibrillation and history of syncope (bradycardia mediated status post pacemaker).  Patient admitted September 2021 with syncopal episode.  Found to be bradycardic and ultimately underwent pacemaker. Carotid Doppler September 2021 showed 1 to 39% bilateral stenosis.  Nuclear study August 2022 showed ejection fraction 40% with normal perfusion. 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  Monitor November 2023 showed sinus rhythm with PVCs.  Echo 4/24 showed normal LV function, mild LVH, grade 1 DD, mild MR, mild AI, moderate AS (mean gradient 27 mmHg).  Recently found to have increased episodes of atrial fibrillation on device check.  Since last seen, she continues to have some dyspnea on exertion.  No orthopnea, PND, pedal edema.  Occasional brief chest pain by her report described as a tightness.  No syncope.  She describes a rash on her chest since initiating Cardizem.  Current Outpatient Medications  Medication Sig Dispense Refill   acyclovir ointment (ZOVIRAX) 5 % Apply 1 Application topically daily as needed. 30 g 1   albuterol (VENTOLIN HFA) 108 (90 Base) MCG/ACT inhaler Inhale 2 puffs into the lungs every 6 (six) hours as needed for wheezing or shortness of breath. 8 g 0   apixaban (ELIQUIS) 5 MG TABS tablet Take 1 tablet (5 mg total) by mouth 2 (two) times daily. 180 tablet 2   baclofen (LIORESAL) 10 MG tablet TAKE 1 TABLET BY MOUTH 3  TIMES DAILY AS NEEDED FOR  MUSCLE SPASMS 270 tablet 0   Calcipotriene 0.005 % solution Apply topically.     Clobetasol Propionate 0.05 % shampoo Apply 1 application topically daily. As needed for scalp irritation from psoriasis 236 mL 3   conjugated estrogens (PREMARIN) vaginal cream Place 1 Applicatorful vaginally daily. 42.5 g 12   diltiazem (CARDIZEM CD) 180 MG 24 hr capsule Take 1 capsule (180 mg total) by mouth daily. 90 capsule 1   diltiazem (TIAZAC) 180 MG 24 hr capsule       DULoxetine (CYMBALTA) 60 MG capsule Take 1 capsule (60 mg total) by mouth daily. 90 capsule 1   fluticasone (FLOVENT HFA) 110 MCG/ACT inhaler Inhale 2 puffs into the lungs 2 (two) times daily as needed (for shortness of breath or wheezing). 3 each 11   fluticasone-salmeterol (ADVAIR) 250-50 MCG/ACT AEPB Inhale into the lungs.     gabapentin (NEURONTIN) 100 MG capsule TAKE 1 CAPSULE BY MOUTH  TWICE DAILY AS NEEDED 180 capsule 1   hydrochlorothiazide (HYDRODIURIL) 12.5 MG tablet TAKE 1 TABLET BY MOUTH DAILY 90 tablet 3   levothyroxine (SYNTHROID) 112 MCG tablet TAKE 1 TABLET BY MOUTH DAILY  BEFORE BREAKFAST 90 tablet 3   meclizine (ANTIVERT) 25 MG tablet Take 1 tablet (25 mg total) by mouth 3 (three) times daily as needed for dizziness. 30 tablet 0   meloxicam (MOBIC) 15 MG tablet Take 15 mg by mouth daily as needed for pain.     metoprolol tartrate (LOPRESSOR) 100 MG tablet Take 1 tablet (100 mg total) by mouth 2 (two) times daily. 180 tablet 2   mometasone (ELOCON) 0.1 % cream Apply 1 Application topically daily. To vaginal area as needed for redness/irritation. Can use twice daily for the first week 100 g 1   montelukast (SINGULAIR) 10 MG tablet Take by mouth.     omeprazole (PRILOSEC) 40 MG capsule TAKE 1 CAPSULE BY MOUTH IN  THE MORNING AND MAY REPEAT  DOSE IN THE EVENING AS  NEEDED FOR ACID REFLUX /  INDIGESTION 180 capsule 3   ondansetron (ZOFRAN) 4 MG tablet Take 1 tablet (4 mg total) by mouth every 8 (eight) hours as needed for nausea or vomiting. 30 tablet 3   pravastatin (PRAVACHOL) 80 MG tablet TAKE 1 TABLET BY MOUTH AT  BEDTIME 90 tablet 3   sennosides-docusate sodium (SENOKOT-S) 8.6-50 MG tablet Take 2 tablets by mouth daily. (Patient taking differently: Take 2 tablets by mouth as needed.) 30 tablet 1   tetrahydrozoline 0.05 % ophthalmic solution Place 2 drops into both eyes daily as needed (for dry eyes).     traMADol (ULTRAM) 50 MG tablet SMARTSIG:1 Tablet(s) By Mouth Every 12 Hours PRN      triamcinolone (NASACORT ALLERGY 24HR) 55 MCG/ACT AERO nasal inhaler Place 2 sprays into the nose daily. 3 each 3   valsartan (DIOVAN) 80 MG tablet TAKE 1 TABLET BY MOUTH DAILY 90 tablet 3   No current facility-administered medications for this visit.     Past Medical History:  Diagnosis Date   Acquired trigger finger 08/05/2013   Arthritis    Asthma    Atrial fibrillation (HCC)    paroxysmal , addressed  with metoprolol    Callus of foot 09/03/2018   Chronic sinusitis    Complication of anesthesia    Dyspnea    sob on exertion, reports at her pre-op today 09-13-2018 that her cardiologist has planned for her to do a ECHO for evaluation    Family history of adverse reaction to anesthesia    sister is hard to wake up from Anesthesia   Follicular lymphoma (HCC)    received Chemotherapy (2 years ago)  and radiation (last 12/2017)   Hyperlipidemia    Hypertension    Hypothyroidism    Meniere's disease    PONV (postoperative nausea and vomiting)    Primary localized osteoarthritis of left knee 09/24/2018   Primary localized osteoarthritis of right knee 06/18/2018   Reflux    Sebaceous cyst 09/03/2018   Shingles 10/24/2019    Past Surgical History:  Procedure Laterality Date   ABDOMINAL HYSTERECTOMY     BONE MARROW BIOPSY  06/16/2014   at [facility]   CARPAL TUNNEL RELEASE     bilateral   CHOLECYSTECTOMY     KNEE ARTHROSCOPY     PACEMAKER IMPLANT N/A 03/30/2020   Procedure: PACEMAKER IMPLANT;  Surgeon: Hillis Range, MD;  Location: [facility];  Service: Cardiovascular;  Laterality: N/A;   PARTIAL KNEE ARTHROPLASTY Right 06/18/2018   Procedure: UNICOMPARTMENTAL KNEE;  Surgeon: Teryl Lucy, MD;  Location: [facility];  Service: Orthopedics;  Laterality: Right;   PARTIAL KNEE ARTHROPLASTY Left 09/24/2018   Procedure: UNICOMPARTMENTAL KNEE;  Surgeon: Teryl Lucy, MD;  Location: [facility];  Service: Orthopedics;  Laterality: Left;    Social History   Socioeconomic History    Marital status: Widowed    Spouse name: Not on file   Number of children: 1   Years of education: 84   Highest education level: 12th grade  Occupational History   Occupation: Advertising account planner    Comment: retired  Tobacco Use   Smoking status: Never   Smokeless tobacco: Never  Vaping Use   Vaping status: Never Used  Substance and Sexual Activity   Alcohol use: No   Drug use: No   Sexual activity: Not Currently    Partners: Male  Other Topics Concern   Not on file  Social History Narrative   No exercise. 2 cups of coffee a day  Social Determinants of Health   Financial Resource Strain: Low Risk  (10/11/2022)   Overall Financial Resource Strain (CARDIA)    Difficulty of Paying Living Expenses: Not hard at all  Food Insecurity: Low Risk  (10/23/2022)   [facility]   Hunger Vital Sign    Worried About Running Out of Food in the Last Year: Never true    Ran Out of Food in the Last Year: Never true  Transportation Needs: No Transportation Needs (10/23/2022)   [facility]   Transportation    In the past 12 months, has lack of reliable transportation kept you from medical appointments, meetings, work or from getting things needed for daily living? : No  Physical Activity: Unknown (10/11/2022)   Exercise Vital Sign    Days of Exercise per Week: 0 days    Minutes of Exercise per Session: Not on file  Stress: No Stress Concern Present (10/11/2022)   [employer] of Occupational Health - Occupational Stress Questionnaire    Feeling of Stress : Only a little  Social Connections: Unknown (02/08/2023)   Received from [facility]   Social Network    Social Network: Not on file  Intimate Partner Violence: Unknown (02/08/2023)   Received from [facility]   HITS    Physically Hurt: Not on file    Insult or Talk Down To: Not on file    Threaten Physical Harm: Not on file    Scream or Curse: Not on file    Family History   Problem Relation Age of Onset   Skin cancer Mother    Stroke Mother    Prostate cancer Father    High blood pressure Father    Heart attack Father    Heart disease Other     ROS: no fevers or chills, productive cough, hemoptysis, dysphasia, odynophagia, melena, hematochezia, dysuria, hematuria, rash, seizure activity, orthopnea, PND, pedal edema, claudication. Remaining systems are negative.  Physical Exam: Well-developed well-nourished in no acute distress.  Skin is warm and dry.  HEENT is normal.  Neck is supple.  Chest is clear to auscultation with normal expansion.  Cardiovascular exam is regular rate and rhythm.  3/6 systolic murmur left sternal border. Abdominal exam nontender or distended. No masses palpated. Extremities show no edema. neuro grossly intact  A/P  1 AS/AI-symptoms are unchanged.  Most recent echocardiogram shows moderate aortic stenosis.  Plan repeat study April 2025.  She understands she will likely require TAVR in the future.  She also understands the symptoms to be aware of including worsening dyspnea, chest pain or syncope.  2 paroxysmal atrial fibrillation-increased frequency noted on recent device check.  Continue metoprolol.  Continue apixaban.  3 hypertension-blood pressure controlled.  However she is complaining of a rash on her chest since initiating diltiazem.  I will discontinue this medicine and treat with amlodipine 5 mg daily.  Follow blood pressure and adjust as needed.  4 history of AVNRT-continue beta-blocker.  5 PVCs-continue beta-blocker.  6 pacemaker-managed by Dr. Elberta Fortis.  7 chest pain-symptoms are somewhat atypical.  Electrocardiogram is unchanged compared to previous.  Will arrange Lexiscan nuclear study to screen for ischemia.  Olga Millers, MD

## 2023-03-13 ENCOUNTER — Ambulatory Visit: Payer: Medicare Other | Attending: Cardiology | Admitting: Cardiology

## 2023-03-13 ENCOUNTER — Encounter: Payer: Self-pay | Admitting: Cardiology

## 2023-03-13 VITALS — BP 138/88 | HR 65 | Ht 65.0 in | Wt 174.4 lb

## 2023-03-13 DIAGNOSIS — R079 Chest pain, unspecified: Secondary | ICD-10-CM | POA: Insufficient documentation

## 2023-03-13 DIAGNOSIS — I480 Paroxysmal atrial fibrillation: Secondary | ICD-10-CM | POA: Diagnosis not present

## 2023-03-13 DIAGNOSIS — I350 Nonrheumatic aortic (valve) stenosis: Secondary | ICD-10-CM | POA: Diagnosis not present

## 2023-03-13 MED ORDER — AMLODIPINE BESYLATE 5 MG PO TABS: 5.0000 mg | ORAL_TABLET | Freq: Every day | ORAL | 0 refills | Status: DC

## 2023-03-13 NOTE — Patient Instructions (Addendum)
Medication Instructions:  Your physician has recommended you make the following change in your medication:  STOP: Diltiazem (Cardizem) START: Amlodipine (Norvasc) 5 mg daily.  *If you need a refill on your cardiac medications before your next appointment, please call your pharmacy*   Lab Work: None    Testing/Procedures: Your physician has requested that you have an echocardiogram April 2025. Echocardiography is a painless test that uses sound waves to create images of your heart. It provides your doctor with information about the size and shape of your heart and how well your heart's chambers and valves are working. This procedure takes approximately one hour. There are no restrictions for this procedure. Please do NOT wear cologne, perfume, aftershave, or lotions (deodorant is allowed). Please arrive 15 minutes prior to your appointment time.  Your physician has requested that you have a lexiscan myoview. For further information please visit [email or web address] Please follow instruction sheet, as given.   The test will take approximately 3 to 4 hours to complete; you may bring reading material.  If someone comes with you to your appointment, they will need to remain in the main lobby due to limited space in the testing area. **If you are pregnant or breastfeeding, please notify the nuclear lab prior to your appointment**  How to prepare for your Myocardial Perfusion Test: Do not eat or drink 3 hours prior to your test, except you may have water. Do not consume products containing caffeine (regular or decaffeinated) 12 hours prior to your test. (ex: coffee, chocolate, sodas, tea). Do bring a list of your current medications with you.  If not listed below, you may take your medications as normal. Do wear comfortable clothes (no dresses or overalls) and walking shoes, tennis shoes preferred (No heels or open toe shoes are allowed). Do NOT wear cologne, perfume, aftershave, or lotions  (deodorant is allowed). If these instructions are not followed, your test will have to be rescheduled.   Follow-Up: At [facility], you and your health needs are our priority.  As part of our continuing mission to provide you with exceptional heart care, we have created designated Provider Care Teams.  These Care Teams include your primary Cardiologist (physician) and Advanced Practice Providers (APPs -  Physician Assistants and Nurse Practitioners) who all work together to provide you with the care you need, when you need it.    Your next appointment:   6 month(s)  Provider:   Olga Millers, MD

## 2023-03-14 ENCOUNTER — Encounter (HOSPITAL_COMMUNITY): Payer: Self-pay

## 2023-03-14 DIAGNOSIS — D485 Neoplasm of uncertain behavior of skin: Secondary | ICD-10-CM | POA: Diagnosis not present

## 2023-03-14 DIAGNOSIS — L309 Dermatitis, unspecified: Secondary | ICD-10-CM | POA: Diagnosis not present

## 2023-03-14 DIAGNOSIS — C44529 Squamous cell carcinoma of skin of other part of trunk: Secondary | ICD-10-CM | POA: Diagnosis not present

## 2023-03-14 DIAGNOSIS — L400 Psoriasis vulgaris: Secondary | ICD-10-CM | POA: Diagnosis not present

## 2023-03-14 NOTE — Addendum Note (Signed)
Addended by: Reynolds Bowl on: 03/14/2023 08:26 AM   Modules accepted: Orders

## 2023-03-14 NOTE — Addendum Note (Signed)
Addended by: Lewayne Bunting on: 03/14/2023 08:39 AM   Modules accepted: Orders

## 2023-03-19 ENCOUNTER — Other Ambulatory Visit: Payer: Self-pay

## 2023-03-20 ENCOUNTER — Telehealth: Payer: Self-pay | Admitting: Cardiology

## 2023-03-20 NOTE — Telephone Encounter (Signed)
Pharmacy states [place] has a law that if a medication has a narrow therapeutic window, they have to new manufacturer get approval from provider and patient to change the medication to the new manufacturer. Lupin is the new manufacturer. Approval to change and fill prescription

## 2023-03-20 NOTE — Telephone Encounter (Signed)
Pt c/o medication issue:  1. Name of Medication:   levothyroxine (SYNTHROID) 112 MCG tablet   2. How are you currently taking this medication (dosage and times per day)?   As prescribed  3. Are you having a reaction (difficulty breathing--STAT)?   No  4. What is your medication issue?   Patient stated her pharmacy is no longer going to carry this medication and pharmacy sent request to change the medication.  Patient stated she will not be available after 4:00 pm today.

## 2023-03-20 NOTE — Telephone Encounter (Signed)
Pt c/o medication issue:  1. Name of Medication:   levothyroxine (SYNTHROID) 112 MCG tablet    2. How are you currently taking this medication (dosage and times per day)?   3. Are you having a reaction (difficulty breathing--STAT)?   4. What is your medication issue? Patient's pharmacy is calling to discuss medication changing manufacturer. They state any time there is a change in manufacturer, they have to get an okay from the doctors stating it is okay to switch to new manufacturer with same medication. Requesting fax in regards to this.

## 2023-03-23 ENCOUNTER — Other Ambulatory Visit: Payer: Self-pay | Admitting: Medical-Surgical

## 2023-03-23 ENCOUNTER — Ambulatory Visit (HOSPITAL_COMMUNITY): Payer: Medicare Other | Attending: Cardiology

## 2023-03-23 VITALS — Ht 65.0 in | Wt 174.0 lb

## 2023-03-23 DIAGNOSIS — R079 Chest pain, unspecified: Secondary | ICD-10-CM | POA: Diagnosis not present

## 2023-03-23 DIAGNOSIS — R110 Nausea: Secondary | ICD-10-CM | POA: Diagnosis not present

## 2023-03-23 LAB — MYOCARDIAL PERFUSION IMAGING
LV dias vol: 123 mL (ref 46–106)
LV sys vol: 70 mL
Nuc Stress EF: 43 %
Peak HR: 102 {beats}/min
Rest HR: 76 {beats}/min
Rest Nuclear Isotope Dose: 10.6 mCi
SDS: 4
SRS: 1
SSS: 5
ST Depression (mm): 0 mm
Stress Nuclear Isotope Dose: 29.5 mCi
TID: 1.06

## 2023-03-23 MED ORDER — AMINOPHYLLINE 25 MG/ML IV SOLN
150.0000 mg | Freq: Once | INTRAVENOUS | Status: AC
Start: 2023-03-23 — End: 2023-03-23
  Administered 2023-03-23: 150 mg via INTRAVENOUS

## 2023-03-23 MED ORDER — TECHNETIUM TC 99M TETROFOSMIN IV KIT
10.6000 | PACK | Freq: Once | INTRAVENOUS | Status: AC | PRN
  Administered 2023-03-23: 10.6 via INTRAVENOUS

## 2023-03-23 MED ORDER — TECHNETIUM TC 99M TETROFOSMIN IV KIT
29.5000 | PACK | Freq: Once | INTRAVENOUS | Status: AC | PRN
  Administered 2023-03-23: 29.5 via INTRAVENOUS

## 2023-03-23 MED ORDER — REGADENOSON 0.4 MG/5ML IV SOLN
0.4000 mg | Freq: Once | INTRAVENOUS | Status: AC
Start: 2023-03-23 — End: 2023-03-23
  Administered 2023-03-23: 0.4 mg via INTRAVENOUS

## 2023-03-28 ENCOUNTER — Ambulatory Visit (INDEPENDENT_AMBULATORY_CARE_PROVIDER_SITE_OTHER): Payer: Medicare Other

## 2023-03-28 DIAGNOSIS — I441 Atrioventricular block, second degree: Secondary | ICD-10-CM | POA: Diagnosis not present

## 2023-04-03 NOTE — Progress Notes (Signed)
Remote pacemaker transmission.   

## 2023-04-11 NOTE — Progress Notes (Signed)
Remote pacemaker transmission.   

## 2023-04-13 ENCOUNTER — Encounter: Payer: Medicare Other | Admitting: Cardiology

## 2023-04-20 DIAGNOSIS — H26491 Other secondary cataract, right eye: Secondary | ICD-10-CM | POA: Diagnosis not present

## 2023-04-23 DIAGNOSIS — C44529 Squamous cell carcinoma of skin of other part of trunk: Secondary | ICD-10-CM | POA: Diagnosis not present

## 2023-04-23 DIAGNOSIS — L905 Scar conditions and fibrosis of skin: Secondary | ICD-10-CM | POA: Diagnosis not present

## 2023-04-27 DIAGNOSIS — H26492 Other secondary cataract, left eye: Secondary | ICD-10-CM | POA: Diagnosis not present

## 2023-04-30 DIAGNOSIS — Z08 Encounter for follow-up examination after completed treatment for malignant neoplasm: Secondary | ICD-10-CM | POA: Diagnosis not present

## 2023-04-30 DIAGNOSIS — L089 Local infection of the skin and subcutaneous tissue, unspecified: Secondary | ICD-10-CM | POA: Diagnosis not present

## 2023-04-30 DIAGNOSIS — L309 Dermatitis, unspecified: Secondary | ICD-10-CM | POA: Diagnosis not present

## 2023-04-30 DIAGNOSIS — L814 Other melanin hyperpigmentation: Secondary | ICD-10-CM | POA: Diagnosis not present

## 2023-04-30 DIAGNOSIS — R208 Other disturbances of skin sensation: Secondary | ICD-10-CM | POA: Diagnosis not present

## 2023-04-30 DIAGNOSIS — L570 Actinic keratosis: Secondary | ICD-10-CM | POA: Diagnosis not present

## 2023-04-30 DIAGNOSIS — R58 Hemorrhage, not elsewhere classified: Secondary | ICD-10-CM | POA: Diagnosis not present

## 2023-04-30 DIAGNOSIS — L820 Inflamed seborrheic keratosis: Secondary | ICD-10-CM | POA: Diagnosis not present

## 2023-04-30 DIAGNOSIS — C8290 Follicular lymphoma, unspecified, unspecified site: Secondary | ICD-10-CM | POA: Diagnosis not present

## 2023-04-30 DIAGNOSIS — R6889 Other general symptoms and signs: Secondary | ICD-10-CM | POA: Diagnosis not present

## 2023-04-30 DIAGNOSIS — L821 Other seborrheic keratosis: Secondary | ICD-10-CM | POA: Diagnosis not present

## 2023-04-30 DIAGNOSIS — Z85828 Personal history of other malignant neoplasm of skin: Secondary | ICD-10-CM | POA: Diagnosis not present

## 2023-04-30 DIAGNOSIS — L400 Psoriasis vulgaris: Secondary | ICD-10-CM | POA: Diagnosis not present

## 2023-05-14 ENCOUNTER — Other Ambulatory Visit: Payer: Self-pay | Admitting: Cardiology

## 2023-05-17 ENCOUNTER — Other Ambulatory Visit: Payer: Self-pay | Admitting: Cardiology

## 2023-05-23 DIAGNOSIS — M48062 Spinal stenosis, lumbar region with neurogenic claudication: Secondary | ICD-10-CM | POA: Diagnosis not present

## 2023-05-23 DIAGNOSIS — M4326 Fusion of spine, lumbar region: Secondary | ICD-10-CM | POA: Diagnosis not present

## 2023-05-23 DIAGNOSIS — Z6830 Body mass index (BMI) 30.0-30.9, adult: Secondary | ICD-10-CM | POA: Diagnosis not present

## 2023-05-24 ENCOUNTER — Encounter: Payer: Self-pay | Admitting: Cardiology

## 2023-05-24 ENCOUNTER — Ambulatory Visit: Payer: Medicare Other | Attending: Cardiology | Admitting: Cardiology

## 2023-05-24 VITALS — BP 150/84 | HR 72 | Ht 65.0 in | Wt 175.0 lb

## 2023-05-24 DIAGNOSIS — R001 Bradycardia, unspecified: Secondary | ICD-10-CM

## 2023-05-24 DIAGNOSIS — I480 Paroxysmal atrial fibrillation: Secondary | ICD-10-CM | POA: Diagnosis not present

## 2023-05-24 DIAGNOSIS — I471 Supraventricular tachycardia, unspecified: Secondary | ICD-10-CM

## 2023-05-24 DIAGNOSIS — D6869 Other thrombophilia: Secondary | ICD-10-CM | POA: Diagnosis not present

## 2023-05-24 DIAGNOSIS — I493 Ventricular premature depolarization: Secondary | ICD-10-CM

## 2023-05-24 LAB — CUP PACEART INCLINIC DEVICE CHECK
Battery Remaining Longevity: 143 mo
Battery Voltage: 3.01 V
Brady Statistic AP VP Percent: 1.64 %
Brady Statistic AP VS Percent: 15.42 %
Brady Statistic AS VP Percent: 11.46 %
Brady Statistic AS VS Percent: 71.47 %
Brady Statistic RA Percent Paced: 18.3 %
Brady Statistic RV Percent Paced: 13.1 %
Date Time Interrogation Session: 20241114172353
Implantable Lead Connection Status: 753985
Implantable Lead Connection Status: 753985
Implantable Lead Implant Date: 20210921
Implantable Lead Implant Date: 20210921
Implantable Lead Location: 753859
Implantable Lead Location: 753860
Implantable Lead Model: 3830
Implantable Lead Model: 5076
Implantable Pulse Generator Implant Date: 20210921
Lead Channel Impedance Value: 323 Ohm
Lead Channel Impedance Value: 380 Ohm
Lead Channel Impedance Value: 380 Ohm
Lead Channel Impedance Value: 532 Ohm
Lead Channel Pacing Threshold Amplitude: 0.625 V
Lead Channel Pacing Threshold Amplitude: 0.875 V
Lead Channel Pacing Threshold Pulse Width: 0.4 ms
Lead Channel Pacing Threshold Pulse Width: 0.4 ms
Lead Channel Sensing Intrinsic Amplitude: 2.625 mV
Lead Channel Sensing Intrinsic Amplitude: 2.875 mV
Lead Channel Sensing Intrinsic Amplitude: 21.25 mV
Lead Channel Sensing Intrinsic Amplitude: 21.25 mV
Lead Channel Setting Pacing Amplitude: 1.5 V
Lead Channel Setting Pacing Amplitude: 2 V
Lead Channel Setting Pacing Pulse Width: 0.4 ms
Lead Channel Setting Sensing Sensitivity: 0.9 mV
Zone Setting Status: 755011

## 2023-05-24 MED ORDER — CHLORTHALIDONE 25 MG PO TABS: 25.0000 mg | ORAL_TABLET | Freq: Every day | ORAL | 11 refills | Status: DC

## 2023-05-24 NOTE — Patient Instructions (Signed)
Medication Instructions:  Your physician has recommended you make the following change in your medication:  STOP Hydrochlorothiazide START Chlorthalidone 25 mg once daily  *If you need a refill on your cardiac medications before your next appointment, please call your pharmacy*   Lab Work: None ordered   Testing/Procedures: None ordered   Follow-Up: At [facility], you and your health needs are our priority.  As part of our continuing mission to provide you with exceptional heart care, we have created designated Provider Care Teams.  These Care Teams include your primary Cardiologist (physician) and Advanced Practice Providers (APPs -  Physician Assistants and Nurse Practitioners) who all work together to provide you with the care you need, when you need it.   Your next appointment:   1 year(s)  The format for your next appointment:   In Person  Provider:   You will see one of the following Advanced Practice Providers on your designated Care Team:   Francis Dowse, [place]" [place], [place] Canary Brim, NP {   Thank you for choosing [facility]!!   Dory Horn, RN [phone]  Other Instructions  Chlorthalidone; Clonidine oral tablets What is this medication? CHLORTHALIDONE; CLONIDINE (klor THAL i done ; KLOE ni deen) is a combination of two medicines that are used to treat high blood pressure. Chlorthalidone is a diuretic. It lowers blood pressure and increases the amount of urine passed, which causes the body to lose salt and water. Clonidine also lowers blood pressure. This medicine may be used for other purposes; ask your health care provider or pharmacist if you have questions. This medicine may be used for other purposes; ask your health care provider or pharmacist if you have questions. COMMON BRAND NAME(S): Clorpres, Combipres What should I tell my care team before I take this medication? They need to know if you have any of these  conditions: asthma diabetes gout kidney disease liver disease systemic lupus erythematosus (SLE) an unusual or allergic reaction to chlorthalidone, sulfa drugs, clonidine, other medicines, foods, dyes, or preservatives pregnant or trying to get pregnant breast-feeding How should I use this medication? Take this medicine by mouth with a glass of water. Take this medicine with food. Follow the directions on the prescription label. Take your doses at regular intervals. Do not take your medicine more often than directed. Remember that you will need to pass urine frequently after taking this medicine. Do not suddenly stop taking this medicine. You must gradually reduce the dose or you may get a dangerous increase in blood pressure. Ask your doctor or health care professional for advice. Talk to your pediatrician regarding the use of this medicine in children. Special care may be needed. Overdosage: If you think you have taken too much of this medicine contact a poison control center or emergency room at once. NOTE: This medicine is only for you. Do not share this medicine with others. Overdosage: If you think you have taken too much of this medicine contact a poison control center or emergency room at once. NOTE: This medicine is only for you. Do not share this medicine with others. What if I miss a dose? If you miss a dose, take it as soon as you can. If it is almost time for your next dose, take only that dose. Do not take double or extra doses. What may interact with this medication? barbiturate medicines for inducing sleep or treating seizures like phenobarbital certain medicines for depression, like amitriptyline or imipramine digoxin ephedra, Ma  Huang glycyrrhizin (licorice) lithium medicines for diabetes other medicines for high blood pressure steroid medicines like prednisone or cortisone This list may not describe all possible interactions. Give your health care provider a list of  all the medicines, herbs, non-prescription drugs, or dietary supplements you use. Also tell them if you smoke, drink alcohol, or use illegal drugs. Some items may interact with your medicine. This list may not describe all possible interactions. Give your health care provider a list of all the medicines, herbs, non-prescription drugs, or dietary supplements you use. Also tell them if you smoke, drink alcohol, or use illegal drugs. Some items may interact with your medicine. What should I watch for while using this medication? Visit your doctor or health care professional for regular checks on your progress. Check your blood pressure regularly as directed. Ask your doctor or health care professional what your blood pressure should be and when you should contact him or her. You may need blood work done while you are taking this medicine. You may need to be on a special diet while taking this medicine. Ask your doctor. You may get drowsy or dizzy. Do not drive, use machinery, or do anything that needs mental alertness until you know how this medicine affects you. To avoid dizzy or fainting spells, do not stand or sit up quickly, especially if you are an older person. Alcohol can make you more drowsy and dizzy. Avoid alcoholic drinks. This medicine may affect blood sugar levels. If you have diabetes, check with your doctor or health care professional before you change your diet or the dose of your diabetic medicine. Your mouth may get dry. Chewing sugarless gum or sucking hard candy, and drinking plenty of water may help. Contact your doctor if the problem does not go away or is severe. This medicine can make you more sensitive to the sun. Keep out of the sun. If you cannot avoid being in the sun, wear protective clothing and use sunscreen. Do not use sun lamps or tanning beds/booths. Do not treat yourself for coughs, colds, or pain while you are taking this medicine without asking your doctor or health care  professional for advice. Some ingredients may increase your blood pressure. If you are going to have surgery tell your doctor or health care professional that you are taking this medicine. What side effects may I notice from receiving this medication? Side effects that you should report to your doctor or health care professional as soon as possible: allergic reactions like skin rash, itching or hives, swelling of the face, lips, or tongue anxiety, nervousness chest pain depressed mood fast, irregular heartbeat feeling faint or lightheaded, falls increased hunger or thirst muscle pain, cramps, or spasm pain or difficulty when passing urine pain, tingling, numbness in the hands or feet redness, blistering, peeling or loosening of the skin, including inside the mouth swelling of feet or legs unusually weak or tired yellowing of the eyes or skin Side effects that usually do not require medical attention (report to your doctor or health care professional if they continue or are bothersome): change in sex drive or performance drowsiness dry mouth headache nausea stomach upset This list may not describe all possible side effects. Call your doctor for medical advice about side effects. You may report side effects to FDA at 1-800-FDA-1088. This list may not describe all possible side effects. Call your doctor for medical advice about side effects. You may report side effects to FDA at 1-800-FDA-1088. Where should I keep  my medication? Keep out of the reach of children. Store between 20 and 25 degrees C (68 and 77 degrees F). Protect from moisture. Throw away any unused medicine after the expiration date. NOTE: This sheet is a summary. It may not cover all possible information. If you have questions about this medicine, talk to your doctor, pharmacist, or health care provider.  2023 Elsevier/Gold Standard (2010-04-29 00:00:00)

## 2023-05-24 NOTE — Progress Notes (Signed)
  Electrophysiology Office Note:   Date:  05/24/2023  ID:  [name], [name] 05/17/1935, MRN [id number]  Primary Cardiologist: Olga Millers, MD Electrophysiologist: Regan Lemming, MD      History of Present Illness:   [name] is a 88 y.o. female with h/o pretension, hyperlipidemia, symptomatic bradycardia post pacemaker, SVT, PVCs, nonsustained VT seen today for routine electrophysiology followup.   Since last being seen in our clinic the patient reports doing well.  She is minimally symptomatic.  She is able to do her daily activities.  She has no chest pain, but does have shortness of breath.  Her daughter is going to talk to her primary physician about a referral to pulmonary medicine..  she denies chest pain, palpitations, PND, orthopnea, nausea, vomiting, dizziness, syncope, edema, weight gain, or early satiety.   Review of systems complete and found to be negative unless listed in HPI.      EP Information / Studies Reviewed:    EKG is not ordered today. EKG from 03/13/23 reviewed which showed this rhythm, first-degree AV block, right bundle branch block      PPM Interrogation-  reviewed in detail today,  See PACEART report.  Device History: Medtronic Dual Chamber PPM implanted 03/30/20 for Symptomatic bradycardia  Risk Assessment/Calculations:    CHA2DS2-VASc Score = 4   This indicates a 4.8% annual risk of stroke. The patient's score is based upon: CHF History: 0 HTN History: 1 Diabetes History: 0 Stroke History: 0 Vascular Disease History: 0 Age Score: 2 Gender Score: 1            Physical Exam:   VS:  BP (!) 150/84 (BP Location: Left Arm, Patient Position: Sitting, Cuff Size: Normal)   Pulse 72   Ht 5\' 5"  (1.651 m)   Wt 175 lb (79.4 kg)   SpO2 97%   BMI 29.12 kg/m    Wt Readings from Last 3 Encounters:  05/24/23 175 lb (79.4 kg)  03/23/23 174 lb (78.9 kg)  03/13/23 174 lb 6.4 oz (79.1 kg)     GEN: Well nourished, well developed in no  acute distress NECK: No JVD; No carotid bruits CARDIAC: Regular rate and rhythm, 2/6 systolic murmur at the base RESPIRATORY:  Clear to auscultation without rales, wheezing or rhonchi  ABDOMEN: Soft, non-tender, non-distended EXTREMITIES:  No edema; No deformity   ASSESSMENT AND PLAN:    Symptomatic bradycardia s/p Medtronic PPM  Normal PPM function See Pace Art report No changes today  2.  SVT: Likely due to AVNRT.  Patient has not wanted ablation.  Currently on metoprolol.  3.  Hypertension: Hydrochlorothiazide and start 25 mg chlorthalidone.  She can follow-up further with her primary physician and primary cardiologist for blood pressure issues.  4.  PVCs: Burden of 8.6%.  Continue monitoring.  5.  Paroxysmal atrial fibrillation: Low burden on cardiac monitor.  Currently on Eliquis.  Continue with current management.  6.  Secondary hypercoagulable state: Currently on Eliquis for atrial fibrillation  7.  Aortic stenosis: Plan per primary cardiology  Disposition:   Follow up with EP APP in 12 months  Signed, Birt Reinoso Jorja Loa, MD

## 2023-05-28 DIAGNOSIS — L244 Irritant contact dermatitis due to drugs in contact with skin: Secondary | ICD-10-CM | POA: Diagnosis not present

## 2023-06-11 ENCOUNTER — Other Ambulatory Visit (HOSPITAL_COMMUNITY): Payer: Self-pay | Admitting: Physician Assistant

## 2023-06-11 DIAGNOSIS — I480 Paroxysmal atrial fibrillation: Secondary | ICD-10-CM

## 2023-06-12 NOTE — Telephone Encounter (Signed)
Eliquis 5mg  refill request received. Patient is 88 years old, weight-79.4kg, Crea- 0.94 on 12/29/22, Diagnosis-Afib, and last seen by Dr. Elberta Fortis on 05/24/23. Dose is appropriate based on dosing criteria. Will send in refill to requested pharmacy.

## 2023-06-14 DIAGNOSIS — J339 Nasal polyp, unspecified: Secondary | ICD-10-CM | POA: Diagnosis not present

## 2023-06-14 DIAGNOSIS — R0982 Postnasal drip: Secondary | ICD-10-CM | POA: Diagnosis not present

## 2023-06-14 DIAGNOSIS — Z9889 Other specified postprocedural states: Secondary | ICD-10-CM | POA: Diagnosis not present

## 2023-06-14 DIAGNOSIS — J300 Vasomotor rhinitis: Secondary | ICD-10-CM | POA: Diagnosis not present

## 2023-06-14 DIAGNOSIS — J328 Other chronic sinusitis: Secondary | ICD-10-CM | POA: Diagnosis not present

## 2023-06-27 ENCOUNTER — Ambulatory Visit (INDEPENDENT_AMBULATORY_CARE_PROVIDER_SITE_OTHER): Payer: Medicare Other

## 2023-06-27 DIAGNOSIS — I441 Atrioventricular block, second degree: Secondary | ICD-10-CM | POA: Diagnosis not present

## 2023-06-27 LAB — CUP PACEART REMOTE DEVICE CHECK
Battery Remaining Longevity: 141 mo
Battery Voltage: 3.01 V
Brady Statistic AP VP Percent: 6.55 %
Brady Statistic AP VS Percent: 0 %
Brady Statistic AS VP Percent: 89.03 %
Brady Statistic AS VS Percent: 4.42 %
Brady Statistic RA Percent Paced: 8.11 %
Brady Statistic RV Percent Paced: 95.58 %
Date Time Interrogation Session: 20241217213457
Implantable Lead Connection Status: 753985
Implantable Lead Connection Status: 753985
Implantable Lead Implant Date: 20210921
Implantable Lead Implant Date: 20210921
Implantable Lead Location: 753859
Implantable Lead Location: 753860
Implantable Lead Model: 3830
Implantable Lead Model: 5076
Implantable Pulse Generator Implant Date: 20210921
Lead Channel Impedance Value: 285 Ohm
Lead Channel Impedance Value: 342 Ohm
Lead Channel Impedance Value: 342 Ohm
Lead Channel Impedance Value: 494 Ohm
Lead Channel Pacing Threshold Amplitude: 0.625 V
Lead Channel Pacing Threshold Amplitude: 0.875 V
Lead Channel Pacing Threshold Pulse Width: 0.4 ms
Lead Channel Pacing Threshold Pulse Width: 0.4 ms
Lead Channel Sensing Intrinsic Amplitude: 2.875 mV
Lead Channel Sensing Intrinsic Amplitude: 2.875 mV
Lead Channel Sensing Intrinsic Amplitude: 30.25 mV
Lead Channel Sensing Intrinsic Amplitude: 30.25 mV
Lead Channel Setting Pacing Amplitude: 1.5 V
Lead Channel Setting Pacing Amplitude: 2 V
Lead Channel Setting Pacing Pulse Width: 0.4 ms
Lead Channel Setting Sensing Sensitivity: 0.9 mV
Zone Setting Status: 755011

## 2023-07-05 ENCOUNTER — Other Ambulatory Visit: Payer: Self-pay | Admitting: Medical-Surgical

## 2023-07-05 ENCOUNTER — Telehealth: Payer: Self-pay

## 2023-07-05 DIAGNOSIS — N904 Leukoplakia of vulva: Secondary | ICD-10-CM

## 2023-07-05 DIAGNOSIS — Z780 Asymptomatic menopausal state: Secondary | ICD-10-CM

## 2023-07-05 MED ORDER — MOMETASONE FUROATE 0.1 % EX CREA
1.0000 | TOPICAL_CREAM | Freq: Every day | CUTANEOUS | 0 refills | Status: DC
Start: 2023-07-05 — End: 2023-07-05

## 2023-07-05 MED ORDER — MOMETASONE FUROATE 0.1 % EX CREA
1.0000 | TOPICAL_CREAM | Freq: Every day | CUTANEOUS | 0 refills | Status: AC
Start: 2023-07-05 — End: ?

## 2023-07-05 NOTE — Telephone Encounter (Signed)
Copied from CRM [id number]. Topic: Clinical - Prescription Issue >> Jul 05, 2023  3:17 PM Antony Haste wrote: Reason for CRM: PT needs mometasone (ELOCON) 0.1 % cream sent to [facility]. PT is requesting to have Rx [facility]ill from [facility] cancelled for insurance purposes. Informed PT it may take 3 business days for Rx refills.   Callback #: [phone]

## 2023-07-05 NOTE — Telephone Encounter (Signed)
Copied from CRM [id number]. Topic: Clinical - Medication Refill >> Jul 05, 2023 11:09 AM Desma Mcgregor wrote: Most Recent Primary Care Visit:  Provider: Christen Butter  Department: [facility]  Visit Type: OFFICE VISIT  Date: 03/01/2023  Medication: mometasone (ELOCON) 0.1 % cream  Has the patient contacted their pharmacy? No (Agent: If no, request that the patient contact the pharmacy for the refill. If patient does not wish to contact the pharmacy document the reason why and proceed with request.) (Agent: If yes, when and what did the pharmacy advise?) Its been over a year  Is this the correct pharmacy for this prescription?  If no, delete pharmacy and type the correct one.  This is the patient's preferred pharmacy:  [id number] [place], [place] - [address] [place] [address] Phone: [phone] Fax: [phone]  [facility]) - [place], [place] - [address] [facility] [address] [place] [address] [place] [address] Phone: [phone] Fax: [phone]  [facility] - [place], [place] - [address] [place] [address] Phone: [phone] Fax: [phone]  [facility] [email or web address] - [place], [place] - [address] [place] [address] Phone: [phone] Fax: [phone]   Has the prescription been filled recently?   Is the patient out of the medication?   Has the patient been seen for an appointment in the last year OR does the patient have an upcoming appointment?   Can we respond through MyChart?   Agent: Please be advised that Rx refills may take up to 3 business days. We ask that you follow-up with your pharmacy.

## 2023-07-05 NOTE — Telephone Encounter (Signed)
Cancelled mometasone at [facility] and resent to [facility]  home delivery in [place] as patient has requested.

## 2023-07-29 ENCOUNTER — Other Ambulatory Visit: Payer: Self-pay | Admitting: Cardiology

## 2023-08-03 NOTE — Progress Notes (Signed)
Remote pacemaker transmission.

## 2023-08-06 ENCOUNTER — Ambulatory Visit (INDEPENDENT_AMBULATORY_CARE_PROVIDER_SITE_OTHER): Payer: Medicare Other | Admitting: Podiatry

## 2023-08-06 DIAGNOSIS — M79675 Pain in left toe(s): Secondary | ICD-10-CM | POA: Diagnosis not present

## 2023-08-06 DIAGNOSIS — L600 Ingrowing nail: Secondary | ICD-10-CM

## 2023-08-06 DIAGNOSIS — M79674 Pain in right toe(s): Secondary | ICD-10-CM

## 2023-08-06 NOTE — Progress Notes (Unsigned)
Subjective:   Patient ID: [name], female   DOB: 89 y.o.   MRN: [id number]   HPI No chief complaint on file.  54-year-old female presents the office today for concerns of ingrown toenails there is skin growing up to the side of her nails on the right 1st through 3rd toes as well as the left first and second toe.  She gets pedicures regularly does make it feel better but a week later the pain comes back.  She had to discuss other treatment options.  She has previously had a corn of the right third toenail removed and healed well but it did come back causing discomfort.  No drainage or pus.  She is also asked  The nails to be trimmed as are causing pain when the become elongated.  No open lesions.  She is on Eliquis.  Review of Systems  All other systems reviewed and are negative.  Past Medical History:  Diagnosis Date   Acquired trigger finger 08/05/2013   Arthritis    Asthma    Atrial fibrillation (HCC)    paroxysmal , addressed  with metoprolol    Callus of foot 09/03/2018   Chronic sinusitis    Complication of anesthesia    Dyspnea    sob on exertion, reports at her pre-op today 09-13-2018 that her cardiologist has planned for her to do a ECHO for evaluation    Family history of adverse reaction to anesthesia    sister is hard to wake up from Anesthesia   Follicular lymphoma (HCC)    received Chemotherapy (2 years ago)  and radiation (last 12/2017)   Hyperlipidemia    Hypertension    Hypothyroidism    Meniere's disease    PONV (postoperative nausea and vomiting)    Primary localized osteoarthritis of left knee 09/24/2018   Primary localized osteoarthritis of right knee 06/18/2018   Reflux    Sebaceous cyst 09/03/2018   Shingles 10/24/2019    Past Surgical History:  Procedure Laterality Date   ABDOMINAL HYSTERECTOMY     BONE MARROW BIOPSY  06/16/2014   at [facility]   CARPAL TUNNEL RELEASE     bilateral   CHOLECYSTECTOMY     KNEE ARTHROSCOPY     PACEMAKER IMPLANT  N/A 03/30/2020   Procedure: PACEMAKER IMPLANT;  Surgeon: Hillis Range, MD;  Location: [facility];  Service: Cardiovascular;  Laterality: N/A;   PARTIAL KNEE ARTHROPLASTY Right 06/18/2018   Procedure: UNICOMPARTMENTAL KNEE;  Surgeon: Teryl Lucy, MD;  Location: [facility];  Service: Orthopedics;  Laterality: Right;   PARTIAL KNEE ARTHROPLASTY Left 09/24/2018   Procedure: UNICOMPARTMENTAL KNEE;  Surgeon: Teryl Lucy, MD;  Location: [facility];  Service: Orthopedics;  Laterality: Left;     Current Outpatient Medications:    cephALEXin (KEFLEX) 500 MG capsule, Take 1 capsule (500 mg total) by mouth 3 (three) times daily., Disp: 21 capsule, Rfl: 0   acyclovir ointment (ZOVIRAX) 5 %, Apply 1 Application topically daily as needed., Disp: 30 g, Rfl: 1   albuterol (VENTOLIN HFA) 108 (90 Base) MCG/ACT inhaler, Inhale 2 puffs into the lungs every 6 (six) hours as needed for wheezing or shortness of breath., Disp: 8 g, Rfl: 0   amLODipine (NORVASC) 5 MG tablet, TAKE 1 TABLET BY MOUTH DAILY, Disp: 90 tablet, Rfl: 3   apixaban (ELIQUIS) 5 MG TABS tablet, TAKE 1 TABLET BY MOUTH TWICE  DAILY, Disp: 180 tablet, Rfl: 1   baclofen (LIORESAL) 10 MG tablet, TAKE 1 TABLET  BY MOUTH 3  TIMES DAILY AS NEEDED FOR  MUSCLE SPASMS, Disp: 270 tablet, Rfl: 0   Calcipotriene 0.005 % solution, Apply topically., Disp: , Rfl:    chlorthalidone (HYGROTON) 25 MG tablet, Take 1 tablet (25 mg total) by mouth daily., Disp: 30 tablet, Rfl: 11   Clobetasol Propionate 0.05 % shampoo, Apply 1 application topically daily. As needed for scalp irritation from psoriasis, Disp: 236 mL, Rfl: 3   conjugated estrogens (PREMARIN) vaginal cream, Place 1 Applicatorful vaginally daily., Disp: 42.5 g, Rfl: 12   DULoxetine (CYMBALTA) 60 MG capsule, Take 1 capsule (60 mg total) by mouth daily., Disp: 90 capsule, Rfl: 1   fluticasone (FLOVENT HFA) 110 MCG/ACT inhaler, Inhale 2 puffs into the lungs 2 (two) times daily as needed (for shortness of  breath or wheezing)., Disp: 3 each, Rfl: 11   fluticasone-salmeterol (ADVAIR) 250-50 MCG/ACT AEPB, Inhale into the lungs., Disp: , Rfl:    gabapentin (NEURONTIN) 100 MG capsule, TAKE 1 CAPSULE BY MOUTH TWICE  DAILY AS NEEDED, Disp: 180 capsule, Rfl: 1   levothyroxine (SYNTHROID) 112 MCG tablet, TAKE 1 TABLET BY MOUTH DAILY  BEFORE BREAKFAST, Disp: 90 tablet, Rfl: 3   meclizine (ANTIVERT) 25 MG tablet, Take 1 tablet (25 mg total) by mouth 3 (three) times daily as needed for dizziness., Disp: 30 tablet, Rfl: 0   meloxicam (MOBIC) 15 MG tablet, Take 15 mg by mouth daily as needed for pain., Disp: , Rfl:    metoprolol tartrate (LOPRESSOR) 100 MG tablet, TAKE 1 TABLET BY MOUTH TWICE  DAILY, Disp: 180 tablet, Rfl: 3   mometasone (ELOCON) 0.1 % cream, Apply 1 Application topically daily. To vaginal area as needed for redness/irritation. Can use twice daily for the first week, Disp: 100 g, Rfl: 0   montelukast (SINGULAIR) 10 MG tablet, Take by mouth., Disp: , Rfl:    omeprazole (PRILOSEC) 40 MG capsule, TAKE 1 CAPSULE BY MOUTH IN  THE MORNING AND MAY REPEAT  DOSE IN THE EVENING AS  NEEDED FOR ACID REFLUX /  INDIGESTION, Disp: 180 capsule, Rfl: 3   ondansetron (ZOFRAN) 4 MG tablet, Take 1 tablet (4 mg total) by mouth every 8 (eight) hours as needed for nausea or vomiting., Disp: 30 tablet, Rfl: 3   pravastatin (PRAVACHOL) 80 MG tablet, TAKE 1 TABLET BY MOUTH AT  BEDTIME, Disp: 90 tablet, Rfl: 3   sennosides-docusate sodium (SENOKOT-S) 8.6-50 MG tablet, Take 2 tablets by mouth daily. (Patient taking differently: Take 2 tablets by mouth as needed.), Disp: 30 tablet, Rfl: 1   tetrahydrozoline 0.05 % ophthalmic solution, Place 2 drops into both eyes daily as needed (for dry eyes)., Disp: , Rfl:    traMADol (ULTRAM) 50 MG tablet, SMARTSIG:1 Tablet(s) By Mouth Every 12 Hours PRN, Disp: , Rfl:    triamcinolone (NASACORT ALLERGY 24HR) 55 MCG/ACT AERO nasal inhaler, Place 2 sprays into the nose daily., Disp: 3 each,  Rfl: 3   valsartan (DIOVAN) 80 MG tablet, TAKE 1 TABLET BY MOUTH DAILY, Disp: 90 tablet, Rfl: 2  No Known Allergies         Objective:  Physical Exam  General: AAO x3, NAD  Dermatological: Incurvation present to the medial and lateral aspects of bilateral hallux, second digit toenails as well as the medial aspect the right third toenail.  She has tenderness to palpation all his nails.  The rest nails are elongated and mildly hypertrophic with yellow discoloration.  No edema or erythema.  No open lesions otherwise.  Vascular: Dorsalis  Pedis artery and Posterior Tibial artery pedal pulses are 2/4 bilateral with immedate capillary fill time.  There is no pain with calf compression, swelling, warmth, erythema.   Neruologic: Grossly intact via light touch bilateral.    Musculoskeletal: Tenderness assist with ingrown toenails but no other areas of discomfort.     Assessment:   89 year old female with ingrown toenails     Plan:  -Treatment options discussed including all alternatives, risks, and complications -Etiology of symptoms were discussed -We discussed treatment options.  She is getting regular pedicures as it is without long-term improvement.  We discussed partial nail avulsions with chemical matricectomy.  She will to proceed with this.  Will start with the right foot today and plan to the left foot next couple weeks once the right side is healed. -At this time, the patient is requesting partial nail removal with chemical matricectomy to the symptomatic portion of the nail. Risks and complications were discussed with the patient for which they understand and written consent was obtained. Under sterile conditions a total of 3 mL of a mixture of 2% lidocaine plain and 0.5% Marcaine plain was infiltrated in a digital block fashion. Once anesthetized, the skin was prepped in sterile fashion. A tourniquet was then applied. Next the medial, lateral aspect of the hallux and second digit  toenails and medial border of the right third toenail was removed in total manage remove all nail borders. Once the nails were ensured to be removed area was debrided and the underlying skin was intact. There is no purulence identified in the procedure. Next phenol was then applied under standard conditions and copiously irrigated.  Silvadene was applied. A dry sterile dressing was applied. After application of the dressing the tourniquet was removed and there is found to be an immediate capillary refill time to the digit. The patient tolerated the procedure well any complications. Post procedure instructions were discussed the patient for which he verbally understood. Discussed signs/symptoms of infection and directed to call the office immediately should any occur or go directly to the emergency room. In the meantime, encouraged to call the office with any questions, concerns, changes symptoms. -Keflex (the medication I thought was sent at the time of the appointment but was resent on 08/08/2023 as it was sitting ordered but not sent)  -As a courtesy I sharply debrided the other toenails without any complications or bleeding.  Vivi Barrack DPM

## 2023-08-06 NOTE — Patient Instructions (Signed)

## 2023-08-07 ENCOUNTER — Telehealth: Payer: Self-pay | Admitting: Podiatry

## 2023-08-07 NOTE — Telephone Encounter (Signed)
Patient called in regards to have her soak instructions sent to her MyChart. I do not have access, please and thank you.

## 2023-08-08 MED ORDER — CEPHALEXIN 500 MG PO CAPS: 500.0000 mg | ORAL_CAPSULE | Freq: Three times a day (TID) | ORAL | 0 refills | Status: DC

## 2023-08-23 ENCOUNTER — Ambulatory Visit: Payer: Medicare Other | Admitting: Podiatry

## 2023-08-23 DIAGNOSIS — M461 Sacroiliitis, not elsewhere classified: Secondary | ICD-10-CM | POA: Diagnosis not present

## 2023-08-26 ENCOUNTER — Other Ambulatory Visit: Payer: Self-pay | Admitting: Cardiology

## 2023-08-28 NOTE — Telephone Encounter (Signed)
 Would Dr. Jens Som like to refill this medication since it is not an cardiac medication?

## 2023-09-03 ENCOUNTER — Ambulatory Visit (INDEPENDENT_AMBULATORY_CARE_PROVIDER_SITE_OTHER): Payer: Medicare Other | Admitting: Medical-Surgical

## 2023-09-03 VITALS — BP 146/71 | HR 76 | Resp 20 | Ht 65.0 in | Wt 176.0 lb

## 2023-09-03 DIAGNOSIS — I10 Essential (primary) hypertension: Secondary | ICD-10-CM

## 2023-09-03 DIAGNOSIS — E039 Hypothyroidism, unspecified: Secondary | ICD-10-CM

## 2023-09-03 DIAGNOSIS — Z780 Asymptomatic menopausal state: Secondary | ICD-10-CM

## 2023-09-03 DIAGNOSIS — M8580 Other specified disorders of bone density and structure, unspecified site: Secondary | ICD-10-CM | POA: Diagnosis not present

## 2023-09-03 DIAGNOSIS — D6869 Other thrombophilia: Secondary | ICD-10-CM

## 2023-09-03 DIAGNOSIS — I480 Paroxysmal atrial fibrillation: Secondary | ICD-10-CM

## 2023-09-03 NOTE — Progress Notes (Unsigned)
 Subjective:  Patient ID: [name], female    DOB: 02/17/1935, 89 y.o.   MRN: [id number]  Patient Care Team: Christen Butter, NP as PCP - General (Nurse Practitioner) Lewayne Bunting, MD as PCP - Cardiology (Cardiology) Regan Lemming, MD as PCP - Electrophysiology (Cardiology)   Chief Complaint:  Hypertension   HPI:  [name] is a 89 y.o. female presenting on 09/03/2023 for Hypertension   History, Exam,  Impression and Plan   1. Benign essential hypertension (Primary) Managed by Cardiology (Dr Jens Som). Takes metoprolol and Norvasc with out problems or side effects.  Denies chest pain, arm pain, jaw pain, blurry vision, or lower extremity edema.   2. Paroxysmal atrial fibrillation [facility]) Patient is being followed by cardiology.  Denies weakness fatigue, dizziness, chest pain, sweating, palpitations, or irregular heartbeat.  Has a pacer with rate set at 60 bpm. Regular Heart rate of 60 bpm apical with a low frequency murmur. Patient has scheduled 2D echo next month to be read by cardiology and electrophysiology.  3. Hypercoagulable state due to paroxysmal atrial fibrillation (HCC) On Eliquis being managed by cardiology and electrophysiology.  Denies bleeding allergic reaction, hematuria, nausea, bruising, melena, or coughing blood.  No visible signs of bleeding or bruising during exam.  4. Hypothyroidism, unspecified type Patient is taking Synthroid as directed and denies any complications or side effects from Synthroid.  Denies fatigue, weight gain, cold intolerance, joint and muscle pain, dry skin or dry thinning hair, or depression. No changes in levothyroxine regimen at this time.  5. Osteopenia, unspecified location 6. Postmenopausal Diagnosed with osteopenia in 2019.  Patient is postmenopausal menopausal.  Patient is on Prilosec and is aware of PPI effects on bone density. Denies any recent bone fractures fractures. Last bone density scan was 04/27/2021.  -      DG Bone Density; Future  Follow up plan: Return in about 6 months (around 03/02/2024) for chronic disease follow up. Pt to be called to schedule appointment for bone density scan.    Relevant past medical, surgical, family, and social history reviewed and updated as indicated.  Allergies and medications reviewed and updated. Data reviewed: Chart in Epic.   Past Medical History:  Diagnosis Date   Acquired trigger finger 08/05/2013   Arthritis    Asthma    Atrial fibrillation (HCC)    paroxysmal , addressed  with metoprolol    Callus of foot 09/03/2018   Chronic sinusitis    Complication of anesthesia    Dyspnea    sob on exertion, reports at her pre-op today 09-13-2018 that her cardiologist has planned for her to do a ECHO for evaluation    Family history of adverse reaction to anesthesia    sister is hard to wake up from Anesthesia   Follicular lymphoma (HCC)    received Chemotherapy (2 years ago)  and radiation (last 12/2017)   Hyperlipidemia    Hypertension    Hypothyroidism    Meniere's disease    PONV (postoperative nausea and vomiting)    Primary localized osteoarthritis of left knee 09/24/2018   Primary localized osteoarthritis of right knee 06/18/2018   Reflux    Sebaceous cyst 09/03/2018   Shingles 10/24/2019    Past Surgical History:  Procedure Laterality Date   ABDOMINAL HYSTERECTOMY     BONE MARROW BIOPSY  06/16/2014   at [facility]   CARPAL TUNNEL RELEASE     bilateral   CHOLECYSTECTOMY     KNEE  ARTHROSCOPY     PACEMAKER IMPLANT N/A 03/30/2020   Procedure: PACEMAKER IMPLANT;  Surgeon: Hillis Range, MD;  Location: [facility];  Service: Cardiovascular;  Laterality: N/A;   PARTIAL KNEE ARTHROPLASTY Right 06/18/2018   Procedure: UNICOMPARTMENTAL KNEE;  Surgeon: Teryl Lucy, MD;  Location: [facility];  Service: Orthopedics;  Laterality: Right;   PARTIAL KNEE ARTHROPLASTY Left 09/24/2018   Procedure: UNICOMPARTMENTAL KNEE;  Surgeon: Teryl Lucy, MD;   Location: [facility] OR[facility];  Service: Orthopedics;  Laterality: Left;    Social History   Socioeconomic History   Marital status: Widowed    Spouse name: Not on file   Number of children: 1   Years of education: 43   Highest education level: 12th grade  Occupational History   Occupation: Advertising account planner    Comment: retired  Tobacco Use   Smoking status: Never   Smokeless tobacco: Never  Vaping Use   Vaping status: Never Used  Substance and Sexual Activity   Alcohol use: No   Drug use: No   Sexual activity: Not Currently    Partners: Male  Other Topics Concern   Not on file  Social History Narrative   No exercise. 2 cups of coffee a day    Social Drivers of Corporate investment banker Strain: Low Risk  (09/03/2023)   Overall Financial Resource Strain (CARDIA)    Difficulty of Paying Living Expenses: Not very hard  Food Insecurity: No Food Insecurity (09/03/2023)   Hunger Vital Sign    Worried About Running Out of Food in the Last Year: Never true    Ran Out of Food in the Last Year: Never true  Transportation Needs: No Transportation Needs (09/03/2023)   PRAPARE - Administrator, Civil Service (Medical): No    Lack of Transportation (Non-Medical): No  Physical Activity: Insufficiently Active (09/03/2023)   Exercise Vital Sign    Days of Exercise per Week: 3 days    Minutes of Exercise per Session: 20 min  Stress: No Stress Concern Present (09/03/2023)   [employer] of Occupational Health - Occupational Stress Questionnaire    Feeling of Stress : Only a little  Social Connections: Moderately Integrated (09/03/2023)   Social Connection and Isolation Panel [NHANES]    Frequency of Communication with Friends and Family: Three times a week    Frequency of Social Gatherings with Friends and Family: Once a week    Attends Religious Services: More than 4 times per year    Active Member of Clubs or Organizations: No    Attends Engineer, structural: More than 4  times per year    Marital Status: Widowed  Intimate Partner Violence: Unknown (02/08/2023)   Received from [facility]   HITS    Physically Hurt: Not on file    Insult or Talk Down To: Not on file    Threaten Physical Harm: Not on file    Scream or Curse: Not on file    Outpatient Encounter Medications as of 09/03/2023  Medication Sig   acyclovir ointment (ZOVIRAX) 5 % Apply 1 Application topically daily as needed.   albuterol (VENTOLIN HFA) 108 (90 Base) MCG/ACT inhaler Inhale 2 puffs into the lungs every 6 (six) hours as needed for wheezing or shortness of breath.   amLODipine (NORVASC) 5 MG tablet TAKE 1 TABLET BY MOUTH DAILY   apixaban (ELIQUIS) 5 MG TABS tablet TAKE 1 TABLET BY MOUTH TWICE  DAILY   baclofen (LIORESAL) 10 MG tablet TAKE  1 TABLET BY MOUTH 3  TIMES DAILY AS NEEDED FOR  MUSCLE SPASMS   Calcipotriene 0.005 % solution Apply topically.   cephALEXin (KEFLEX) 500 MG capsule Take 1 capsule (500 mg total) by mouth 3 (three) times daily.   chlorthalidone (HYGROTON) 25 MG tablet Take 1 tablet (25 mg total) by mouth daily.   Clobetasol Propionate 0.05 % shampoo Apply 1 application topically daily. As needed for scalp irritation from psoriasis   conjugated estrogens (PREMARIN) vaginal cream Place 1 Applicatorful vaginally daily.   fluticasone (FLOVENT HFA) 110 MCG/ACT inhaler Inhale 2 puffs into the lungs 2 (two) times daily as needed (for shortness of breath or wheezing).   fluticasone-salmeterol (ADVAIR) 250-50 MCG/ACT AEPB Inhale into the lungs.   gabapentin (NEURONTIN) 100 MG capsule TAKE 1 CAPSULE BY MOUTH TWICE  DAILY AS NEEDED   levothyroxine (SYNTHROID) 112 MCG tablet TAKE 1 TABLET BY MOUTH DAILY  BEFORE BREAKFAST   meclizine (ANTIVERT) 25 MG tablet Take 1 tablet (25 mg total) by mouth 3 (three) times daily as needed for dizziness.   meloxicam (MOBIC) 15 MG tablet Take 15 mg by mouth daily as needed for pain.   metoprolol tartrate (LOPRESSOR) 100 MG tablet TAKE 1 TABLET BY  MOUTH TWICE  DAILY   mometasone (ELOCON) 0.1 % cream Apply 1 Application topically daily. To vaginal area as needed for redness/irritation. Can use twice daily for the first week   montelukast (SINGULAIR) 10 MG tablet Take by mouth.   omeprazole (PRILOSEC) 40 MG capsule TAKE 1 CAPSULE BY MOUTH IN  THE MORNING AND MAY REPEAT  DOSE IN THE EVENING AS  NEEDED FOR ACID REFLUX /  INDIGESTION   ondansetron (ZOFRAN) 4 MG tablet Take 1 tablet (4 mg total) by mouth every 8 (eight) hours as needed for nausea or vomiting.   pravastatin (PRAVACHOL) 80 MG tablet TAKE 1 TABLET BY MOUTH AT  BEDTIME   sennosides-docusate sodium (SENOKOT-S) 8.6-50 MG tablet Take 2 tablets by mouth daily. (Patient taking differently: Take 2 tablets by mouth as needed.)   tetrahydrozoline 0.05 % ophthalmic solution Place 2 drops into both eyes daily as needed (for dry eyes).   traMADol (ULTRAM) 50 MG tablet SMARTSIG:1 Tablet(s) By Mouth Every 12 Hours PRN   triamcinolone (NASACORT ALLERGY 24HR) 55 MCG/ACT AERO nasal inhaler Place 2 sprays into the nose daily.   valsartan (DIOVAN) 80 MG tablet TAKE 1 TABLET BY MOUTH DAILY   [DISCONTINUED] DULoxetine (CYMBALTA) 60 MG capsule Take 1 capsule (60 mg total) by mouth daily.   No facility-administered encounter medications on file as of 09/03/2023.    No Known Allergies  Review of Systems      Objective:  BP (!) 146/71 (BP Location: Left Arm, Cuff Size: Normal)   Pulse 76   Resp 20   Ht 5\' 5"  (1.651 m)   Wt 176 lb (79.8 kg)   SpO2 98%   BMI 29.29 kg/m    Wt Readings from Last 3 Encounters:  09/03/23 176 lb (79.8 kg)  05/24/23 175 lb (79.4 kg)  03/23/23 174 lb (78.9 kg)    Physical Exam  Results for orders placed or performed in visit on 06/27/23  CUP PACEART REMOTE DEVICE CHECK   Collection Time: 06/26/23  9:34 PM  Result Value Ref Range   Date Time Interrogation Session [id number]    Pulse Generator Manufacturer MERM    Pulse Gen Model [id number] Azure XT DR MRI     Pulse Gen Serial Number [id number]    Clinic  Name [facility]    Implantable Pulse Generator Type Implantable Pulse Generator    Implantable Pulse Generator Implant Date 16109604    Implantable Lead Manufacturer MERM    Implantable Lead Model 3830 SelectSecure MRI SureScan    Implantable Lead Serial Number [id number]    Implantable Lead Implant Date 47829562    Implantable Lead Location Detail 1 SEPTUM    Implantable Lead Location [id number]    Implantable Lead Connection Status [id number]    Implantable Lead Manufacturer MERM    Implantable Lead Model 5076 CapSureFix Novus MRI SureScan    Implantable Lead Serial Number [id number]    Implantable Lead Implant Date 13086578    Implantable Lead Location Detail 1 UNKNOWN    Implantable Lead Location [id number]    Implantable Lead Connection Status L088196    Lead Channel Setting Sensing Sensitivity 0.9 mV   Lead Channel Setting Pacing Amplitude 1.5 V   Lead Channel Setting Pacing Pulse Width 0.4 ms   Lead Channel Setting Pacing Amplitude 2 V   Zone Setting Status 755011    Zone Setting Status Active    Lead Channel Impedance Value 342 ohm   Lead Channel Impedance Value 285 ohm   Lead Channel Sensing Intrinsic Amplitude 2.875 mV   Lead Channel Sensing Intrinsic Amplitude 2.875 mV   Lead Channel Pacing Threshold Amplitude 0.625 V   Lead Channel Pacing Threshold Pulse Width 0.4 ms   Lead Channel Impedance Value 494 ohm   Lead Channel Impedance Value 342 ohm   Lead Channel Sensing Intrinsic Amplitude 30.25 mV   Lead Channel Sensing Intrinsic Amplitude 30.25 mV   Lead Channel Pacing Threshold Amplitude 0.875 V   Lead Channel Pacing Threshold Pulse Width 0.4 ms   Battery Status OK    Battery Remaining Longevity 141 mo   Battery Voltage 3.01 V   Brady Statistic RA Percent Paced 8.11 %   Brady Statistic RV Percent Paced 95.58 %   Brady Statistic AP VP Percent 6.55 %   Brady Statistic AS VP Percent 89.03 %   Brady Statistic AP VS Percent 0 %    Brady Statistic AS VS Percent 4.42 %       Pertinent labs & imaging results that were available during my care of the patient were reviewed by me and considered in my medical decision making.   Continue healthy lifestyle choices, including diet (rich in fruits, vegetables, and lean proteins, and low in salt and simple carbohydrates) and exercise (at least 30 minutes of moderate physical activity daily).   The above assessment and management plan was discussed with the patient. The patient verbalized understanding of and has agreed to the management plan. Patient is aware to call the clinic if they develop any new symptoms or if symptoms persist or worsen. Patient is aware when to return to the clinic for a follow-up visit. Patient educated on when it is appropriate to go to the emergency department.   Maryelizabeth Kaufmann Student AGNP

## 2023-09-04 NOTE — Progress Notes (Signed)
 Medical screening examination/treatment was performed by qualified clinical staff member and as supervising provider I was immediately available for consultation/collaboration. I have reviewed documentation and agree with assessment and plan.  Thayer Ohm, DNP, APRN, FNP-BC [facility]

## 2023-09-10 ENCOUNTER — Encounter: Payer: Self-pay | Admitting: Podiatry

## 2023-09-10 ENCOUNTER — Ambulatory Visit (INDEPENDENT_AMBULATORY_CARE_PROVIDER_SITE_OTHER): Payer: Medicare Other | Admitting: Podiatry

## 2023-09-10 DIAGNOSIS — L84 Corns and callosities: Secondary | ICD-10-CM | POA: Diagnosis not present

## 2023-09-10 DIAGNOSIS — M216X2 Other acquired deformities of left foot: Secondary | ICD-10-CM | POA: Diagnosis not present

## 2023-09-10 DIAGNOSIS — M216X1 Other acquired deformities of right foot: Secondary | ICD-10-CM | POA: Diagnosis not present

## 2023-09-10 DIAGNOSIS — L600 Ingrowing nail: Secondary | ICD-10-CM

## 2023-09-10 MED ORDER — CEPHALEXIN 500 MG PO CAPS: 500.0000 mg | ORAL_CAPSULE | Freq: Three times a day (TID) | ORAL | 0 refills | Status: DC

## 2023-09-10 NOTE — Progress Notes (Signed)
 Subjective: Chief Complaint  Patient presents with   Ingrown Toenail    Patient states she is here because she has a ingrown toe nail on her left hallux and would also would like doctor to look at her 2nd toe on left foot    89 year old female presents the office today for concerns of ingrown toenails to her left first and second toe.  She states that she did well after the right first, second and third toenail removal.  She has not been on day 3 but since then the pain is improved.  She still been soaking Epsom salts.  No drainage or pus.  Overall she is feeling better.  She has other concerns of calluses to the balls of her foot today which causes from her times.  She also like the nails to be trimmed as she cannot do them currently and that they get on the call discomfort.  No open lesions.  No other concerns.  Objective: AAO x3, NAD DP/PT pulses palpable bilaterally, CRT less than 3 seconds Status post partial nail avulsion of the right first, second and third digit toenails.  There are some localized inflammation still noted although improved.  No cellulitis identified.  There is no drainage or pus.  Slight tenderness palpation.  There is tenderness palpation of the left first, second digit toenail both medial, lateral nail borders localized edema and erythema again more for inflammation as opposed to infection.  There is no drainage or pus.  No ascending cellulitis. There lesion submetatarsal 2 bilaterally without any underlying ulceration, drainage or signs of infection.  Prominent metatarsal heads. No pain with calf compression, swelling, warmth, erythema  Assessment: 89 year old female status post partial nail avulsion to right foot, healing well; calluses due to prominent metatarsal heads; ingrown toenails left 1st/2nd toes  Plan: S/p ingrown toenail removal right 1st, 2nd, 3rd toes -The nails on the right appear to be healing well without signs of infection.  I would still recommend  soaking Epsom salts keep covered during the day but the area at nighttime. -Monitor for signs of infection, recurrence  Callus, prominent metatarsal heads -As a courtesy debride the calluses and complications of bleeding.  Discussed shoes, good arch support to help decrease pressure.  Ingrown toenails left first, second toenails -At this time, the patient is requesting partial nail removal with chemical matricectomy to the symptomatic portion of the nail. Risks and complications were discussed with the patient for which they understand and written consent was obtained. Under sterile conditions a total of 3 mL of a mixture of 2% lidocaine plain and 0.5% Marcaine plain was infiltrated in a diital block fashion. Once anesthetized, the skin was prepped in sterile fashion. A tourniquet was then applied. Next the medial and lateral aspect of hallux and second digit nail border was then sharply excised making sure to remove the entire offending nail border. Once the nails were ensured to be removed area was debrided and the underlying skin was intact. There is no purulence identified in the procedure. Next phenol was then applied under standard conditions and copiously irrigated. Silvadene was applied. A dry sterile dressing was applied. After application of the dressing the tourniquet was removed and there is found to be an immediate capillary refill time to the digit. The patient tolerated the procedure well any complications. Post procedure instructions were discussed the patient for which he verbally understood. Discussed signs/symptoms of infection and directed to call the office immediately should any occur or go directly to  the emergency room. In the meantime, encouraged to call the office with any questions, concerns, changes symptoms. -Keflex -The courtesy sharply debrided the other nails and complications reported.  Vivi Barrack DPM

## 2023-09-10 NOTE — Patient Instructions (Signed)

## 2023-09-11 DIAGNOSIS — M5459 Other low back pain: Secondary | ICD-10-CM | POA: Diagnosis not present

## 2023-09-11 DIAGNOSIS — M79642 Pain in left hand: Secondary | ICD-10-CM | POA: Diagnosis not present

## 2023-09-11 DIAGNOSIS — E663 Overweight: Secondary | ICD-10-CM | POA: Diagnosis not present

## 2023-09-11 DIAGNOSIS — M1991 Primary osteoarthritis, unspecified site: Secondary | ICD-10-CM | POA: Diagnosis not present

## 2023-09-11 DIAGNOSIS — Z6829 Body mass index (BMI) 29.0-29.9, adult: Secondary | ICD-10-CM | POA: Diagnosis not present

## 2023-09-11 DIAGNOSIS — M3501 Sicca syndrome with keratoconjunctivitis: Secondary | ICD-10-CM | POA: Diagnosis not present

## 2023-09-11 DIAGNOSIS — R21 Rash and other nonspecific skin eruption: Secondary | ICD-10-CM | POA: Diagnosis not present

## 2023-09-11 DIAGNOSIS — M79641 Pain in right hand: Secondary | ICD-10-CM | POA: Diagnosis not present

## 2023-09-19 ENCOUNTER — Other Ambulatory Visit: Payer: Self-pay | Admitting: Podiatry

## 2023-09-19 ENCOUNTER — Encounter: Payer: Self-pay | Admitting: Podiatry

## 2023-09-19 DIAGNOSIS — M79645 Pain in left finger(s): Secondary | ICD-10-CM | POA: Diagnosis not present

## 2023-09-19 DIAGNOSIS — M1812 Unilateral primary osteoarthritis of first carpometacarpal joint, left hand: Secondary | ICD-10-CM | POA: Diagnosis not present

## 2023-09-19 MED ORDER — SULFAMETHOXAZOLE-TRIMETHOPRIM 800-160 MG PO TABS: 1.0000 | ORAL_TABLET | Freq: Two times a day (BID) | ORAL | 0 refills | Status: DC

## 2023-09-21 ENCOUNTER — Ambulatory Visit: Payer: Medicare Other | Admitting: Podiatry

## 2023-09-25 ENCOUNTER — Ambulatory Visit: Payer: Medicare Other | Admitting: Podiatry

## 2023-09-25 ENCOUNTER — Ambulatory Visit (INDEPENDENT_AMBULATORY_CARE_PROVIDER_SITE_OTHER): Admitting: Podiatry

## 2023-09-25 DIAGNOSIS — L600 Ingrowing nail: Secondary | ICD-10-CM | POA: Diagnosis not present

## 2023-09-25 DIAGNOSIS — L539 Erythematous condition, unspecified: Secondary | ICD-10-CM

## 2023-09-25 MED ORDER — GENTAMICIN SULFATE 0.1 % EX OINT: 1.0000 | TOPICAL_OINTMENT | Freq: Two times a day (BID) | CUTANEOUS | 0 refills | Status: AC

## 2023-09-25 NOTE — Patient Instructions (Signed)
 Continue soaking in epsom salts twice a day followed by antibiotic ointment and a band-aid. Can leave uncovered at night. Continue this until completely healed.  If the area has not healed in 2 weeks, call the office for follow-up appointment, or sooner if any problems arise.  Monitor for any signs/symptoms of infection. Call the office immediately if any occur or go directly to the emergency room. Call with any questions/concerns.

## 2023-09-25 NOTE — Progress Notes (Unsigned)
 gent

## 2023-09-26 ENCOUNTER — Encounter: Payer: Self-pay | Admitting: Medical-Surgical

## 2023-09-26 ENCOUNTER — Ambulatory Visit (INDEPENDENT_AMBULATORY_CARE_PROVIDER_SITE_OTHER): Payer: Medicare Other

## 2023-09-26 ENCOUNTER — Ambulatory Visit (INDEPENDENT_AMBULATORY_CARE_PROVIDER_SITE_OTHER)

## 2023-09-26 DIAGNOSIS — I441 Atrioventricular block, second degree: Secondary | ICD-10-CM

## 2023-09-26 DIAGNOSIS — M85832 Other specified disorders of bone density and structure, left forearm: Secondary | ICD-10-CM | POA: Diagnosis not present

## 2023-09-26 DIAGNOSIS — M8580 Other specified disorders of bone density and structure, unspecified site: Secondary | ICD-10-CM

## 2023-09-26 DIAGNOSIS — Z780 Asymptomatic menopausal state: Secondary | ICD-10-CM

## 2023-09-27 LAB — CUP PACEART REMOTE DEVICE CHECK
Battery Remaining Longevity: 136 mo
Battery Voltage: 3.01 V
Brady Statistic AP VP Percent: 11.65 %
Brady Statistic AP VS Percent: 0 %
Brady Statistic AS VP Percent: 83.68 %
Brady Statistic AS VS Percent: 4.67 %
Brady Statistic RA Percent Paced: 13.73 %
Brady Statistic RV Percent Paced: 95.32 %
Date Time Interrogation Session: 20250318222742
Implantable Lead Connection Status: 753985
Implantable Lead Connection Status: 753985
Implantable Lead Implant Date: 20210921
Implantable Lead Implant Date: 20210921
Implantable Lead Location: 753859
Implantable Lead Location: 753860
Implantable Lead Model: 3830
Implantable Lead Model: 5076
Implantable Pulse Generator Implant Date: 20210921
Lead Channel Impedance Value: 323 Ohm
Lead Channel Impedance Value: 361 Ohm
Lead Channel Impedance Value: 399 Ohm
Lead Channel Impedance Value: 532 Ohm
Lead Channel Pacing Threshold Amplitude: 0.625 V
Lead Channel Pacing Threshold Amplitude: 0.75 V
Lead Channel Pacing Threshold Pulse Width: 0.4 ms
Lead Channel Pacing Threshold Pulse Width: 0.4 ms
Lead Channel Sensing Intrinsic Amplitude: 25.125 mV
Lead Channel Sensing Intrinsic Amplitude: 25.125 mV
Lead Channel Sensing Intrinsic Amplitude: 3.25 mV
Lead Channel Sensing Intrinsic Amplitude: 3.25 mV
Lead Channel Setting Pacing Amplitude: 1.5 V
Lead Channel Setting Pacing Amplitude: 2 V
Lead Channel Setting Pacing Pulse Width: 0.4 ms
Lead Channel Setting Sensing Sensitivity: 0.9 mV
Zone Setting Status: 755011

## 2023-10-01 ENCOUNTER — Other Ambulatory Visit: Payer: Self-pay | Admitting: Cardiology

## 2023-10-03 DIAGNOSIS — E039 Hypothyroidism, unspecified: Secondary | ICD-10-CM | POA: Diagnosis not present

## 2023-10-03 DIAGNOSIS — I10 Essential (primary) hypertension: Secondary | ICD-10-CM | POA: Diagnosis not present

## 2023-10-03 DIAGNOSIS — G5602 Carpal tunnel syndrome, left upper limb: Secondary | ICD-10-CM | POA: Diagnosis not present

## 2023-10-03 DIAGNOSIS — J4520 Mild intermittent asthma, uncomplicated: Secondary | ICD-10-CM | POA: Diagnosis not present

## 2023-10-03 DIAGNOSIS — Z01818 Encounter for other preprocedural examination: Secondary | ICD-10-CM | POA: Diagnosis not present

## 2023-10-03 DIAGNOSIS — Z950 Presence of cardiac pacemaker: Secondary | ICD-10-CM | POA: Diagnosis not present

## 2023-10-03 DIAGNOSIS — C8240 Follicular lymphoma grade IIIb, unspecified site: Secondary | ICD-10-CM | POA: Diagnosis not present

## 2023-10-03 DIAGNOSIS — I480 Paroxysmal atrial fibrillation: Secondary | ICD-10-CM | POA: Diagnosis not present

## 2023-10-03 NOTE — Telephone Encounter (Signed)
 Dr. Ludwig Clarks pt is requesting a refill of Omeprazole. As this is not a Cardiac Med, does Dr. Jens Som want to refill? Please advise

## 2023-10-09 ENCOUNTER — Ambulatory Visit: Admitting: Podiatry

## 2023-10-11 ENCOUNTER — Ambulatory Visit (HOSPITAL_COMMUNITY): Payer: Medicare Other | Attending: Cardiology

## 2023-10-11 DIAGNOSIS — I350 Nonrheumatic aortic (valve) stenosis: Secondary | ICD-10-CM | POA: Diagnosis not present

## 2023-10-11 LAB — ECHOCARDIOGRAM COMPLETE
AR max vel: 0.76 cm2
AV Area VTI: 0.69 cm2
AV Area mean vel: 0.72 cm2
AV Mean grad: 36 mmHg
AV Peak grad: 51.3 mmHg
Ao pk vel: 3.58 m/s
Area-P 1/2: 2.66 cm2
P 1/2 time: 438 ms
S' Lateral: 3.6 cm

## 2023-10-12 ENCOUNTER — Encounter: Payer: Self-pay | Admitting: *Deleted

## 2023-10-13 ENCOUNTER — Other Ambulatory Visit: Payer: Self-pay | Admitting: Cardiology

## 2023-10-16 DIAGNOSIS — I480 Paroxysmal atrial fibrillation: Secondary | ICD-10-CM | POA: Diagnosis not present

## 2023-10-16 DIAGNOSIS — I10 Essential (primary) hypertension: Secondary | ICD-10-CM | POA: Diagnosis not present

## 2023-10-16 DIAGNOSIS — M79645 Pain in left finger(s): Secondary | ICD-10-CM | POA: Diagnosis not present

## 2023-10-16 DIAGNOSIS — E039 Hypothyroidism, unspecified: Secondary | ICD-10-CM | POA: Diagnosis not present

## 2023-10-16 DIAGNOSIS — G5602 Carpal tunnel syndrome, left upper limb: Secondary | ICD-10-CM | POA: Diagnosis not present

## 2023-10-16 DIAGNOSIS — C8290 Follicular lymphoma, unspecified, unspecified site: Secondary | ICD-10-CM | POA: Diagnosis not present

## 2023-10-30 DIAGNOSIS — Z4789 Encounter for other orthopedic aftercare: Secondary | ICD-10-CM | POA: Diagnosis not present

## 2023-10-30 DIAGNOSIS — M1812 Unilateral primary osteoarthritis of first carpometacarpal joint, left hand: Secondary | ICD-10-CM | POA: Diagnosis not present

## 2023-11-01 ENCOUNTER — Other Ambulatory Visit (HOSPITAL_COMMUNITY): Payer: Self-pay | Admitting: Cardiology

## 2023-11-01 DIAGNOSIS — C8290 Follicular lymphoma, unspecified, unspecified site: Secondary | ICD-10-CM | POA: Diagnosis not present

## 2023-11-01 DIAGNOSIS — I480 Paroxysmal atrial fibrillation: Secondary | ICD-10-CM

## 2023-11-01 NOTE — Telephone Encounter (Signed)
 Eliquis  5mg  refill request received. Patient is 89 years old, weight-79.8kg, Crea-0.94 on 12/29/22, Diagnosis-Afib, and last seen by Dr. Lawana Pray on 05/24/23. Dose is appropriate based on dosing criteria. Will send in refill to requested pharmacy.

## 2023-11-05 NOTE — Progress Notes (Signed)
 HPI: Follow-up AS/AI, paroxysmal atrial fibrillation and history of syncope (bradycardia mediated status post pacemaker).  Patient admitted September 2021 with syncopal episode.  Found to be bradycardic and ultimately underwent pacemaker. Carotid Doppler September 2021 showed 1 to 39% bilateral stenosis.  Noted to have AVNRT on previous device check and metoprolol  increased.  Monitor November 2023 showed sinus rhythm with PVCs. Previously found to have increased episodes of atrial fibrillation on device check.  Nuclear 9/24 showed EF 43 and no ischemia. Echo 4/25 showed normal LV function, grade 1 DD, mild MR, moderate to severe AS (mean gradient 36 mmHg, AVA 0.69 cm2, DI 0.20), mild to moderate AI. Since last seen, patient describes increased dyspnea on exertion.  No orthopnea, PND or pedal edema.  She has had several near syncopal episodes.  She describes these as feeling like she may pass out for several seconds which then resolves.  No associated symptoms.  She had an episode of chest pain several weeks ago not related to activities.  She does not have exertional chest pain.  Current Outpatient Medications  Medication Sig Dispense Refill   acyclovir  ointment (ZOVIRAX ) 5 % Apply 1 Application topically daily as needed. 30 g 1   albuterol  (VENTOLIN  HFA) 108 (90 Base) MCG/ACT inhaler Inhale 2 puffs into the lungs every 6 (six) hours as needed for wheezing or shortness of breath. 8 g 0   amLODipine  (NORVASC ) 5 MG tablet TAKE 1 TABLET BY MOUTH DAILY 90 tablet 3   apixaban  (ELIQUIS ) 5 MG TABS tablet TAKE 1 TABLET BY MOUTH TWICE  DAILY 180 tablet 1   baclofen  (LIORESAL ) 10 MG tablet TAKE 1 TABLET BY MOUTH 3  TIMES DAILY AS NEEDED FOR  MUSCLE SPASMS 270 tablet 0   Calcipotriene 0.005 % solution Apply topically.     cephALEXin  (KEFLEX ) 500 MG capsule Take 1 capsule (500 mg total) by mouth 3 (three) times daily. 21 capsule 0   chlorthalidone  (HYGROTON ) 25 MG tablet Take 1 tablet (25 mg total) by mouth  daily. 30 tablet 11   Clobetasol  Propionate 0.05 % shampoo Apply 1 application topically daily. As needed for scalp irritation from psoriasis 236 mL 3   conjugated estrogens  (PREMARIN ) vaginal cream Place 1 Applicatorful vaginally daily. 42.5 g 12   fluticasone  (FLOVENT  HFA) 110 MCG/ACT inhaler Inhale 2 puffs into the lungs 2 (two) times daily as needed (for shortness of breath or wheezing). 3 each 11   fluticasone -salmeterol (ADVAIR) 250-50 MCG/ACT AEPB Inhale into the lungs.     gabapentin  (NEURONTIN ) 100 MG capsule TAKE 1 CAPSULE BY MOUTH TWICE  DAILY AS NEEDED 180 capsule 1   gentamicin  ointment (GARAMYCIN ) 0.1 % Apply 1 Application topically 2 (two) times daily. 15 g 0   levothyroxine  (SYNTHROID ) 112 MCG tablet TAKE 1 TABLET BY MOUTH DAILY  BEFORE BREAKFAST 90 tablet 3   meclizine  (ANTIVERT ) 25 MG tablet Take 1 tablet (25 mg total) by mouth 3 (three) times daily as needed for dizziness. 30 tablet 0   meloxicam (MOBIC) 15 MG tablet Take 15 mg by mouth daily as needed for pain.     metoprolol  tartrate (LOPRESSOR ) 100 MG tablet TAKE 1 TABLET BY MOUTH TWICE  DAILY 180 tablet 3   mometasone  (ELOCON ) 0.1 % cream Apply 1 Application topically daily. To vaginal area as needed for redness/irritation. Can use twice daily for the first week 100 g 0   montelukast  (SINGULAIR ) 10 MG tablet Take by mouth.     omeprazole  (PRILOSEC) 40 MG  capsule TAKE 1 CAPSULE BY MOUTH IN THE  MORNING AND MAY REPEAT DOSE IN  THE EVENING AS NEEDED FOR ACID  REFLUX / INDIGESTION 180 capsule 3   ondansetron  (ZOFRAN ) 4 MG tablet Take 1 tablet (4 mg total) by mouth every 8 (eight) hours as needed for nausea or vomiting. 30 tablet 3   pravastatin  (PRAVACHOL ) 80 MG tablet TAKE 1 TABLET BY MOUTH AT  BEDTIME 90 tablet 1   sennosides-docusate sodium  (SENOKOT-S) 8.6-50 MG tablet Take 2 tablets by mouth daily. (Patient taking differently: Take 2 tablets by mouth as needed.) 30 tablet 1   sulfamethoxazole -trimethoprim  (BACTRIM  DS) 800-160  MG tablet Take 1 tablet by mouth 2 (two) times daily. 14 tablet 0   tetrahydrozoline 0.05 % ophthalmic solution Place 2 drops into both eyes daily as needed (for dry eyes).     traMADol (ULTRAM) 50 MG tablet SMARTSIG:1 Tablet(s) By Mouth Every 12 Hours PRN     triamcinolone  (NASACORT  ALLERGY 24HR) 55 MCG/ACT AERO nasal inhaler Place 2 sprays into the nose daily. 3 each 3   valsartan  (DIOVAN ) 80 MG tablet TAKE 1 TABLET BY MOUTH DAILY 90 tablet 2   No current facility-administered medications for this visit.     Past Medical History:  Diagnosis Date   Acquired trigger finger 08/05/2013   Arthritis    Asthma    Atrial fibrillation (HCC)    paroxysmal , addressed  with metoprolol     Callus of foot 09/03/2018   Chronic sinusitis    Complication of anesthesia    Dyspnea    sob on exertion, reports at her pre-op today 09-13-2018 that her cardiologist has planned for her to do a ECHO for evaluation    Family history of adverse reaction to anesthesia    sister is hard to wake up from Anesthesia   Follicular lymphoma (HCC)    received Chemotherapy (2 years ago)  and radiation (last 12/2017)   Hyperlipidemia    Hypertension    Hypothyroidism    Meniere's disease    PONV (postoperative nausea and vomiting)    Primary localized osteoarthritis of left knee 09/24/2018   Primary localized osteoarthritis of right knee 06/18/2018   Reflux    Sebaceous cyst 09/03/2018   Shingles 10/24/2019    Past Surgical History:  Procedure Laterality Date   ABDOMINAL HYSTERECTOMY     BONE MARROW BIOPSY  06/16/2014   at [facility]   CARPAL TUNNEL RELEASE     bilateral   CHOLECYSTECTOMY     KNEE ARTHROSCOPY     PACEMAKER IMPLANT N/A 03/30/2020   Procedure: PACEMAKER IMPLANT;  Surgeon: Jolly Needle, MD;  Location: [facility];  Service: Cardiovascular;  Laterality: N/A;   PARTIAL KNEE ARTHROPLASTY Right 06/18/2018   Procedure: UNICOMPARTMENTAL KNEE;  Surgeon: Osa Blase, MD;  Location: [facility];   Service: Orthopedics;  Laterality: Right;   PARTIAL KNEE ARTHROPLASTY Left 09/24/2018   Procedure: UNICOMPARTMENTAL KNEE;  Surgeon: Osa Blase, MD;  Location: [facility];  Service: Orthopedics;  Laterality: Left;    Social History   Socioeconomic History   Marital status: Widowed    Spouse name: Not on file   Number of children: 1   Years of education: 33   Highest education level: 12th grade  Occupational History   Occupation: Advertising account planner    Comment: retired  Tobacco Use   Smoking status: Never   Smokeless tobacco: Never  Vaping Use   Vaping status: Never Used  Substance and Sexual Activity  Alcohol  use: No   Drug use: No   Sexual activity: Not Currently    Partners: Male  Other Topics Concern   Not on file  Social History Narrative   No exercise. 2 cups of coffee a day    Social Drivers of Corporate investment banker Strain: Low Risk  (09/03/2023)   Overall Financial Resource Strain (CARDIA)    Difficulty of Paying Living Expenses: Not very hard  Food Insecurity: No Food Insecurity (09/03/2023)   Hunger Vital Sign    Worried About Running Out of Food in the Last Year: Never true    Ran Out of Food in the Last Year: Never true  Transportation Needs: No Transportation Needs (09/03/2023)   PRAPARE - Administrator, Civil Service (Medical): No    Lack of Transportation (Non-Medical): No  Physical Activity: Insufficiently Active (09/03/2023)   Exercise Vital Sign    Days of Exercise per Week: 3 days    Minutes of Exercise per Session: 20 min  Stress: No Stress Concern Present (09/03/2023)   [employer] of Occupational Health - Occupational Stress Questionnaire    Feeling of Stress : Only a little  Social Connections: Moderately Isolated (09/03/2023)   Social Connection and Isolation Panel [NHANES]    Frequency of Communication with Friends and Family: Three times a week    Frequency of Social Gatherings with Friends and Family: Once a week    Attends  Religious Services: More than 4 times per year    Active Member of [employer] or Organizations: No    Attends Banker Meetings: Not on file    Marital Status: Widowed  Intimate Partner Violence: Unknown (02/08/2023)   Received from [facility]   HITS    Physically Hurt: Not on file    Insult or Talk Down To: Not on file    Threaten Physical Harm: Not on file    Scream or Curse: Not on file    Family History  Problem Relation Age of Onset   Skin cancer Mother    Stroke Mother    Prostate cancer Father    High blood pressure Father    Heart attack Father    Heart disease Other     ROS: no fevers or chills, productive cough, hemoptysis, dysphasia, odynophagia, melena, hematochezia, dysuria, hematuria, rash, seizure activity, orthopnea, PND, pedal edema, claudication. Remaining systems are negative.  Physical Exam: Well-developed well-nourished in no acute distress.  Skin is warm and dry.  HEENT is normal.  Neck is supple.  Chest is clear to auscultation with normal expansion.  Cardiovascular exam is regular rate and rhythm.  Abdominal exam nontender or distended. No masses palpated. Extremities show no edema. neuro grossly intact  EKG Interpretation Date/Time:  Thursday Nov 15 2023 09:54:33 EDT Ventricular Rate:  68 PR Interval:  228 QRS Duration:  122 QT Interval:  452 QTC Calculation: 480 R Axis:   1  Text Interpretation: Atrial-sensed ventricular-paced rhythm with prolonged AV conduction with occasional Premature ventricular complexes Confirmed by Alexandria Angel ([id number]) on 11/15/2023 9:55:32 AM    A/P  1 aortic stenosis/aortic insufficiency-patient is noted to have severe aortic stenosis and mild to moderate aortic insufficiency on most recent echocardiogram.  She is now having symptoms including worsening dyspnea on exertion and presyncope.  I think she needs to proceed with evaluation for TAVR.  I discussed this in depth today and she is agreeable.  We will  arrange evaluation with our structural heart team.  She will need right and left cardiac catheterization if they feel TAVR is indicated.  2 paroxysmal atrial fibrillation-continue metoprolol  and apixaban  at present dose.  Check hemoglobin.  3 history of AVNRT-continue beta-blocker.  No recurrences.  4 hypertension-patient's blood pressure is controlled.  Continue present medical regimen.  Check potassium and renal function.  5 PVCs-continue beta-blocker.  6 pacemaker-managed by electrophysiology.  Will have device interrogated given presyncope though I feel this is likely related to her aortic stenosis.  7 hyperlipidemia-continue statin.  Check lipids and liver.  8 history of follicular lymphoma-patient had recent CT with stable appearance of lobulated anterior mediastinal mass and nonspecific right hilar adenopathy and no new disease.  She is followed by oncology and was seen on April 24 and there was no clinical evidence of recurrent disease.  Alexandria Angel, MD

## 2023-11-08 DIAGNOSIS — J9859 Other diseases of mediastinum, not elsewhere classified: Secondary | ICD-10-CM | POA: Diagnosis not present

## 2023-11-08 DIAGNOSIS — R590 Localized enlarged lymph nodes: Secondary | ICD-10-CM | POA: Diagnosis not present

## 2023-11-08 DIAGNOSIS — C8290 Follicular lymphoma, unspecified, unspecified site: Secondary | ICD-10-CM | POA: Diagnosis not present

## 2023-11-08 DIAGNOSIS — C8298 Follicular lymphoma, unspecified, lymph nodes of multiple sites: Secondary | ICD-10-CM | POA: Diagnosis not present

## 2023-11-09 NOTE — Addendum Note (Signed)
 Addended by: Lott Rouleau A on: 11/09/2023 11:07 AM   Modules accepted: Orders

## 2023-11-09 NOTE — Progress Notes (Signed)
 Remote pacemaker transmission.

## 2023-11-15 ENCOUNTER — Encounter: Payer: Self-pay | Admitting: Cardiology

## 2023-11-15 ENCOUNTER — Ambulatory Visit: Payer: Medicare Other | Attending: Cardiology | Admitting: Cardiology

## 2023-11-15 VITALS — BP 140/64 | HR 70 | Ht 65.0 in | Wt 173.0 lb

## 2023-11-15 DIAGNOSIS — Z950 Presence of cardiac pacemaker: Secondary | ICD-10-CM | POA: Insufficient documentation

## 2023-11-15 DIAGNOSIS — E7800 Pure hypercholesterolemia, unspecified: Secondary | ICD-10-CM | POA: Diagnosis not present

## 2023-11-15 DIAGNOSIS — I350 Nonrheumatic aortic (valve) stenosis: Secondary | ICD-10-CM | POA: Diagnosis not present

## 2023-11-15 DIAGNOSIS — I10 Essential (primary) hypertension: Secondary | ICD-10-CM | POA: Diagnosis not present

## 2023-11-15 DIAGNOSIS — D6869 Other thrombophilia: Secondary | ICD-10-CM | POA: Insufficient documentation

## 2023-11-15 DIAGNOSIS — I480 Paroxysmal atrial fibrillation: Secondary | ICD-10-CM | POA: Diagnosis not present

## 2023-11-15 NOTE — Patient Instructions (Signed)
 Lab Work: CBC, BMET, Lipids, Liver today If you have labs (blood work) drawn today and your tests are completely normal, you will receive your results only by: MyChart Message (if you have MyChart) OR A paper copy in the mail If you have any lab test that is abnormal or we need to change your treatment, we will call you to review the results.  Testing/Procedures: Ambulatory referral to Structural Heart Team  Follow-Up: At [facility], you and your health needs are our priority.  As part of our continuing mission to provide you with exceptional heart care, our providers are all part of one team.  This team includes your primary Cardiologist (physician) and Advanced Practice Providers or APPs (Physician Assistants and Nurse Practitioners) who all work together to provide you with the care you need, when you need it.  Your next appointment:   4 month(s)  Provider:   Alexandria Angel, MD

## 2023-11-16 LAB — HEPATIC FUNCTION PANEL
ALT: 12 IU/L (ref 0–32)
AST: 14 IU/L (ref 0–40)
Albumin: 4.3 g/dL (ref 3.7–4.7)
Alkaline Phosphatase: 65 IU/L (ref 44–121)
Bilirubin Total: 0.9 mg/dL (ref 0.0–1.2)
Bilirubin, Direct: 0.33 mg/dL (ref 0.00–0.40)
Total Protein: 7 g/dL (ref 6.0–8.5)

## 2023-11-16 LAB — BASIC METABOLIC PANEL WITH GFR
BUN/Creatinine Ratio: 20 (ref 12–28)
BUN: 19 mg/dL (ref 8–27)
CO2: 24 mmol/L (ref 20–29)
Calcium: 9.7 mg/dL (ref 8.7–10.3)
Chloride: 96 mmol/L (ref 96–106)
Creatinine, Ser: 0.94 mg/dL (ref 0.57–1.00)
Glucose: 90 mg/dL (ref 70–99)
Potassium: 4.4 mmol/L (ref 3.5–5.2)
Sodium: 136 mmol/L (ref 134–144)
eGFR: 59 mL/min/{1.73_m2} — ABNORMAL LOW (ref >59)

## 2023-11-16 LAB — CBC
Hematocrit: 40.3 % (ref 34.0–46.6)
Hemoglobin: 13.1 g/dL (ref 11.1–15.9)
MCH: 28.7 pg (ref 26.6–33.0)
MCHC: 32.5 g/dL (ref 31.5–35.7)
MCV: 88 fL (ref 79–97)
Platelets: 272 10*3/uL (ref 150–450)
RBC: 4.56 x10E6/uL (ref 3.77–5.28)
RDW: 13.1 % (ref 11.7–15.4)
WBC: 6.8 10*3/uL (ref 3.4–10.8)

## 2023-11-16 LAB — LIPID PANEL
Chol/HDL Ratio: 3.1 ratio (ref 0.0–4.4)
Cholesterol, Total: 161 mg/dL (ref 100–199)
HDL: 52 mg/dL (ref >39)
LDL Chol Calc (NIH): 88 mg/dL (ref 0–99)
Triglycerides: 118 mg/dL (ref 0–149)
VLDL Cholesterol Cal: 21 mg/dL (ref 5–40)

## 2023-11-19 ENCOUNTER — Encounter: Payer: Self-pay | Admitting: *Deleted

## 2023-11-22 ENCOUNTER — Encounter: Payer: Self-pay | Admitting: Cardiovascular Disease

## 2023-11-22 ENCOUNTER — Ambulatory Visit: Attending: Cardiovascular Disease | Admitting: Cardiovascular Disease

## 2023-11-22 VITALS — BP 154/92 | HR 68 | Ht 65.0 in | Wt 174.2 lb

## 2023-11-22 DIAGNOSIS — I350 Nonrheumatic aortic (valve) stenosis: Secondary | ICD-10-CM | POA: Diagnosis present

## 2023-11-22 NOTE — Progress Notes (Addendum)
 Pre Surgical Assessment: 5 M Walk Test  34M=16.7ft  5 Meter Walk Test- trial 1: 6.21 seconds 5 Meter Walk Test- trial 2: 4.79 seconds 5 Meter Walk Test- trial 3: 5.33 seconds 5 Meter Walk Test Average: 5.44 seconds  _______________________  Procedure Type: Isolated AVR Perioperative Outcome Estimate % Operative Mortality 5.79% Morbidity & Mortality 8.76% Stroke 1.21% Renal Failure 2.52% Reoperation 2.62% Prolonged Ventilation 6.27% Deep Sternal Wound Infection 0.059% Long Hospital Stay (>14 days) 6.23% Short Hospital Stay (<6 days)* 25.8% *higher values reflect a better outcome

## 2023-11-22 NOTE — Patient Instructions (Addendum)
 Medication Instructions:  No changes *If you need a refill on your cardiac medications before your next appointment, please call your pharmacy*  Lab Work: none   Testing/Procedures: none  Follow-Up: At [facility], you and your health needs are our priority.  As part of our continuing mission to provide you with exceptional heart care, our providers are all part of one team.  This team includes your primary Cardiologist (physician) and Advanced Practice Providers or APPs (Physician Assistants and Nurse Practitioners) who all work together to provide you with the care you need, when you need it.  Your next appointment:   Per Structural Heart Team       Cardiac/Peripheral Catheterization   You are scheduled for a Cardiac Catheterization on Wednesday, May 21 with Dr. Antoinette Batman.  1. Please arrive at the [facility] (Main Entrance A) at [facility]: [address] [place], [place] [address] at 11:00 AM (This time is TWO hour(s) before your procedure to ensure your preparation).   Free valet parking service is available. You will check in at ADMITTING. The support person will be asked to wait in the waiting room.  It is OK to have someone drop you off and come back when you are ready to be discharged.        Special note: Every effort is made to have your procedure done on time. Please understand that emergencies sometimes delay scheduled procedures.  2. Diet: Do not eat solid foods after midnight.  You may have clear liquids until 5 AM the day of the procedure.  3. Labs:completed 11/15/23  4. Medication instructions in preparation for your procedure:   Contrast Allergy: No No Eliquis  after Sunday May 18.  You will be instructed when to restart it.  On the morning of your procedure, take Aspirin  81 mg and any morning medicines NOT listed above.  You may use sips of water .  5. Plan to go home the same day, you will only stay overnight if medically  necessary. 6. You MUST have a responsible adult to drive you home. 7. An adult MUST be with you the first 24 hours after you arrive home. 8. Bring a current list of your medications, and the last time and date medication taken. 9. Bring ID and current insurance cards. 10.Please wear clothes that are easy to get on and off and wear slip-on shoes.  Thank you for allowing us  to care for you!   -- [facility]

## 2023-11-22 NOTE — H&P (View-Only) (Signed)
 [facility] Consult Note  Chief Complaint  Patient presents with   New Patient (Initial Visit)    Aortic stenosis    History of Present Illness: [name] with history of paroxysmal atrial fibrillation, symptomatic bradycardia post pacemaker, lymphoma, HTN, HLD, hypothyroidism, GERD and aortic valve disease who is here today as a new consult, referred by Dr. Audery Blazing, for further discussion regarding her aortic stenosis/aortic insufficiency and possible TAVR. She was admitted in September 2021 after a syncopal episode and was found to be bradycardic. A permanent pacemaker was placed. She had atrial fibrillation and SVT noted on her device. She is on Eliquis . Stress myoview  without ischemia in September 2024. Echo April 2025 with LVEF=60-65%. Normal RV function. Mild mitral regurgitation. Severe paradoxical low flow/low gradient aortic stenosis with mean gradient 36 mmHg, AVA 0.69 cm2, SVI 35, DI 0.20.   She tells me today that she has been having dyspnea on exertion, progressive in severity over the last few months. She has had some chest pain and dizziness/near syncope but no syncope. No LE edema. She lives in [place], [place] with her daughter. She is retired from a Theme park manager. She has no active dental problems.   Primary Care Physician: Cherre Cornish, NP Primary Cardiologist: Audery Blazing Referring Cardiologist: Audery Blazing  Past Medical History:  Diagnosis Date   Acquired trigger finger 08/05/2013   Aortic stenosis    Arthritis    Asthma    Atrial fibrillation (HCC)    paroxysmal , addressed  with metoprolol     Callus of foot 09/03/2018   Chronic sinusitis    Complication of anesthesia    Dyspnea    sob on exertion, reports at her pre-op today 09-13-2018 that her cardiologist has planned for her to do a ECHO for evaluation    Family history of adverse reaction to anesthesia    sister is hard to wake up from Anesthesia   Follicular lymphoma (HCC)    received Chemotherapy (2  years ago)  and radiation (last 12/2017)   Hyperlipidemia    Hypertension    Hypothyroidism    Meniere's disease    PONV (postoperative nausea and vomiting)    Primary localized osteoarthritis of left knee 09/24/2018   Primary localized osteoarthritis of right knee 06/18/2018   Reflux    Sebaceous cyst 09/03/2018   Shingles 10/24/2019    Past Surgical History:  Procedure Laterality Date   ABDOMINAL HYSTERECTOMY     BONE MARROW BIOPSY  06/16/2014   at [facility]   CARPAL TUNNEL RELEASE     bilateral   CHOLECYSTECTOMY     KNEE ARTHROSCOPY     LUMBAR FUSION     PACEMAKER IMPLANT N/A 03/30/2020   Procedure: PACEMAKER IMPLANT;  Surgeon: Jolly Needle, MD;  Location: [facility];  Service: Cardiovascular;  Laterality: N/A;   PARTIAL KNEE ARTHROPLASTY Right 06/18/2018   Procedure: UNICOMPARTMENTAL KNEE;  Surgeon: Osa Blase, MD;  Location: [facility];  Service: Orthopedics;  Laterality: Right;   PARTIAL KNEE ARTHROPLASTY Left 09/24/2018   Procedure: UNICOMPARTMENTAL KNEE;  Surgeon: Osa Blase, MD;  Location: [facility];  Service: Orthopedics;  Laterality: Left;   thumb surgery      Current Outpatient Medications  Medication Sig Dispense Refill   acyclovir  ointment (ZOVIRAX ) 5 % Apply 1 Application topically daily as needed. 30 g 1   albuterol  (VENTOLIN  HFA) 108 (90 Base) MCG/ACT inhaler Inhale 2 puffs into the lungs every 6 (six) hours as needed for wheezing or shortness  of breath. 8 g 0   amLODipine  (NORVASC ) 5 MG tablet TAKE 1 TABLET BY MOUTH DAILY 90 tablet 3   apixaban  (ELIQUIS ) 5 MG TABS tablet TAKE 1 TABLET BY MOUTH TWICE  DAILY 180 tablet 1   baclofen  (LIORESAL ) 10 MG tablet TAKE 1 TABLET BY MOUTH 3  TIMES DAILY AS NEEDED FOR  MUSCLE SPASMS 270 tablet 0   Calcipotriene 0.005 % solution Apply topically.     cephALEXin  (KEFLEX ) 500 MG capsule Take 1 capsule (500 mg total) by mouth 3 (three) times daily. 21 capsule 0   chlorthalidone  (HYGROTON ) 25 MG tablet Take 1 tablet  (25 mg total) by mouth daily. 30 tablet 11   Clobetasol  Propionate 0.05 % shampoo Apply 1 application topically daily. As needed for scalp irritation from psoriasis 236 mL 3   conjugated estrogens  (PREMARIN ) vaginal cream Place 1 Applicatorful vaginally daily. 42.5 g 12   diphenhydrAMINE  (BENADRYL ) 25 mg capsule Take 25 mg by mouth every 6 (six) hours as needed.     fluticasone  (FLOVENT  HFA) 110 MCG/ACT inhaler Inhale 2 puffs into the lungs 2 (two) times daily as needed (for shortness of breath or wheezing). 3 each 11   fluticasone -salmeterol (ADVAIR) 250-50 MCG/ACT AEPB Inhale into the lungs.     gabapentin  (NEURONTIN ) 100 MG capsule TAKE 1 CAPSULE BY MOUTH TWICE  DAILY AS NEEDED 180 capsule 1   gentamicin  ointment (GARAMYCIN ) 0.1 % Apply 1 Application topically 2 (two) times daily. 15 g 0   levothyroxine  (SYNTHROID ) 112 MCG tablet TAKE 1 TABLET BY MOUTH DAILY  BEFORE BREAKFAST 90 tablet 3   meclizine  (ANTIVERT ) 25 MG tablet Take 1 tablet (25 mg total) by mouth 3 (three) times daily as needed for dizziness. 30 tablet 0   meloxicam (MOBIC) 15 MG tablet Take 15 mg by mouth daily as needed for pain.     metoprolol  tartrate (LOPRESSOR ) 100 MG tablet TAKE 1 TABLET BY MOUTH TWICE  DAILY 180 tablet 3   mometasone  (ELOCON ) 0.1 % cream Apply 1 Application topically daily. To vaginal area as needed for redness/irritation. Can use twice daily for the first week 100 g 0   montelukast  (SINGULAIR ) 10 MG tablet Take by mouth.     omeprazole  (PRILOSEC) 40 MG capsule TAKE 1 CAPSULE BY MOUTH IN THE  MORNING AND MAY REPEAT DOSE IN  THE EVENING AS NEEDED FOR ACID  REFLUX / INDIGESTION 180 capsule 3   ondansetron  (ZOFRAN ) 4 MG tablet Take 1 tablet (4 mg total) by mouth every 8 (eight) hours as needed for nausea or vomiting. 30 tablet 3   pravastatin  (PRAVACHOL ) 80 MG tablet TAKE 1 TABLET BY MOUTH AT  BEDTIME 90 tablet 1   sennosides-docusate sodium  (SENOKOT-S) 8.6-50 MG tablet Take 2 tablets by mouth daily. (Patient  taking differently: Take 2 tablets by mouth as needed.) 30 tablet 1   sulfamethoxazole -trimethoprim  (BACTRIM  DS) 800-160 MG tablet Take 1 tablet by mouth 2 (two) times daily. 14 tablet 0   tetrahydrozoline 0.05 % ophthalmic solution Place 2 drops into both eyes daily as needed (for dry eyes).     traMADol (ULTRAM) 50 MG tablet SMARTSIG:1 Tablet(s) By Mouth Every 12 Hours PRN     triamcinolone  (NASACORT  ALLERGY 24HR) 55 MCG/ACT AERO nasal inhaler Place 2 sprays into the nose daily. 3 each 3   valsartan  (DIOVAN ) 80 MG tablet TAKE 1 TABLET BY MOUTH DAILY 90 tablet 2   No current facility-administered medications for this visit.    No Known Allergies  Social  History   Socioeconomic History   Marital status: Widowed    Spouse name: Not on file   Number of children: 1   Years of education: 53   Highest education level: 12th grade  Occupational History   Occupation: Advertising account planner    Comment: retired  Tobacco Use   Smoking status: Never   Smokeless tobacco: Never  Vaping Use   Vaping status: Never Used  Substance and Sexual Activity   Alcohol  use: No   Drug use: No   Sexual activity: Not Currently    Partners: Male  Other Topics Concern   Not on file  Social History Narrative   No exercise. 2 cups of coffee a day    Social Drivers of Corporate investment banker Strain: Low Risk  (09/03/2023)   Overall Financial Resource Strain (CARDIA)    Difficulty of Paying Living Expenses: Not very hard  Food Insecurity: No Food Insecurity (09/03/2023)   Hunger Vital Sign    Worried About Running Out of Food in the Last Year: Never true    Ran Out of Food in the Last Year: Never true  Transportation Needs: No Transportation Needs (09/03/2023)   PRAPARE - Administrator, Civil Service (Medical): No    Lack of Transportation (Non-Medical): No  Physical Activity: Insufficiently Active (09/03/2023)   Exercise Vital Sign    Days of Exercise per Week: 3 days    Minutes of Exercise per  Session: 20 min  Stress: No Stress Concern Present (09/03/2023)   [employer] of Occupational Health - Occupational Stress Questionnaire    Feeling of Stress : Only a little  Social Connections: Moderately Isolated (09/03/2023)   Social Connection and Isolation Panel [NHANES]    Frequency of Communication with Friends and Family: Three times a week    Frequency of Social Gatherings with Friends and Family: Once a week    Attends Religious Services: More than 4 times per year    Active Member of [employer] or Organizations: No    Attends Banker Meetings: Not on file    Marital Status: Widowed  Intimate Partner Violence: Unknown (02/08/2023)   Received from [facility]   HITS    Physically Hurt: Not on file    Insult or Talk Down To: Not on file    Threaten Physical Harm: Not on file    Scream or Curse: Not on file    Family History  Problem Relation Age of Onset   Skin cancer Mother    Stroke Mother    Prostate cancer Father    High blood pressure Father    Heart disease Father    Heart disease Other     Review of Systems:  As stated in the HPI and otherwise negative.   BP (!) 154/92   Pulse 68   Ht 5\' 5"  (1.651 m)   Wt 174 lb 3.2 oz (79 kg)   SpO2 92%   BMI 28.99 kg/m   Physical Examination: General: Well developed, well nourished, NAD  HEENT: OP clear, mucus membranes moist  SKIN: warm, dry. No rashes. Neuro: No focal deficits  Musculoskeletal: Muscle strength 5/5 all ext  Psychiatric: Mood and affect normal  Neck: No JVD, no carotid bruits, no thyromegaly, no lymphadenopathy.  Lungs:Clear bilaterally, no wheezes, rhonci, crackles Cardiovascular: Regular rate and rhythm. Loud, harsh, late peaking systolic murmur.  Abdomen:Soft. Bowel sounds present. Non-tender.  Extremities: No lower extremity edema. Pulses are 2 + in the bilateral  DP/PT.  EKG:  EKG is not ordered today. The ekg ordered today demonstrates   Echo 10/11/23: 1. Left ventricular  ejection fraction, by estimation, is 60 to 65%. The  left ventricle has normal function. The left ventricle has no regional  wall motion abnormalities. Left ventricular diastolic parameters are  consistent with Grade I diastolic  dysfunction (impaired relaxation). Elevated left ventricular end-diastolic  pressure.   2. Right ventricular systolic function is normal. The right ventricular  size is normal. Tricuspid regurgitation signal is inadequate for assessing  PA pressure.   3. The mitral valve is degenerative. Mild mitral valve regurgitation. No  evidence of mitral stenosis. Moderate mitral annular calcification.   4. The aortic valve is calcified. There is moderate calcification of the  aortic valve. There is moderate thickening of the aortic valve. Aortic  valve regurgitation is mild to moderate. Moderate to severe aortic valve  stenosis. Aortic regurgitation PHT  measures 438 msec. Aortic valve area, by VTI measures 0.69 cm. Aortic  valve mean gradient measures 36.0 mmHg. Aortic valve Vmax measures 3.58  m/s.   5. There is mild dilatation of the ascending aorta, measuring 38 mm.   6. The inferior vena cava is normal in size with greater than 50%  respiratory variability, suggesting right atrial pressure of 3 mmHg.   7. Compared to echo dated 02/22/2022, mean AVG has increased from 21 to  , Vmax increased from 2.88 to 3.17m/s and DI has decreased from 0.34  to 0.20.   FINDINGS   Left Ventricle: Left ventricular ejection fraction, by estimation, is 60  to 65%. The left ventricle has normal function. The left ventricle has no  regional wall motion abnormalities. The left ventricular internal cavity  size was normal in size. There is   no left ventricular hypertrophy. Left ventricular diastolic parameters  are consistent with Grade I diastolic dysfunction (impaired relaxation).  Elevated left ventricular end-diastolic pressure.   Right Ventricle: The right ventricular size  is normal. No increase in  right ventricular wall thickness. Right ventricular systolic function is  normal. Tricuspid regurgitation signal is inadequate for assessing PA  pressure.   Left Atrium: Left atrial size was normal in size.   Right Atrium: Right atrial size was normal in size.   Pericardium: There is no evidence of pericardial effusion.   Mitral Valve: The mitral valve is degenerative in appearance. There is  mild thickening of the mitral valve leaflet(s). There is mild  calcification of the mitral valve leaflet(s). Moderate mitral annular  calcification. Mild mitral valve regurgitation. No  evidence of mitral valve stenosis.   Tricuspid Valve: The tricuspid valve is normal in structure. Tricuspid  valve regurgitation is not demonstrated. No evidence of tricuspid  stenosis.   The aortic valve is calcified. There is moderate calcification of the  aortic valve. There is moderate thickening of the aortic valve. Aortic  valve regurgitation is mild to moderate. Moderate to severe aortic  stenosis is present.   Pulmonic Valve: The pulmonic valve was normal in structure. Pulmonic valve  regurgitation is trivial. No evidence of pulmonic stenosis.   Aorta: The aortic root is normal in size and structure. There is mild  dilatation of the ascending aorta, measuring 38 mm.   Venous: The inferior vena cava is normal in size with greater than 50%  respiratory variability, suggesting right atrial pressure of 3 mmHg.   IAS/Shunts: No atrial level shunt detected by color flow Doppler.   Additional Comments: A device lead is  visualized.     LEFT VENTRICLE  PLAX 2D  LVIDd:         5.10 cm   Diastology  LVIDs:         3.60 cm   LV e' medial:    4.68 cm/s  LV PW:         1.00 cm   LV E/e' medial:  14.6  LV IVS:        1.00 cm   LV e' lateral:   4.95 cm/s  LVOT diam:     2.10 cm   LV E/e' lateral: 13.8  LV SV:         63  LV SV Index:   34  LVOT Area:     3.46 cm     RIGHT  VENTRICLE             IVC  RV Basal diam:  3.50 cm     IVC diam: 1.60 cm  RV S prime:     11.19 cm/s  TAPSE (M-mode): 1.7 cm   LEFT ATRIUM             Index        RIGHT ATRIUM           Index  LA diam:        5.00 cm 2.67 cm/m   RA Area:     14.30 cm  LA Vol (A2C):   61.2 ml 32.67 ml/m  RA Volume:   35.70 ml  19.05 ml/m  LA Vol (A4C):   31.2 ml 16.65 ml/m  LA Biplane Vol: 47.0 ml 25.09 ml/m   AORTIC VALVE  AV Area (Vmax):    0.76 cm  AV Area (Vmean):   0.72 cm  AV Area (VTI):     0.69 cm  AV Vmax:           358.00 cm/s  AV Vmean:          260.000 cm/s  AV VTI:            0.918 m  AV Peak Grad:      51.3 mmHg  AV Mean Grad:      36.0 mmHg  LVOT Vmax:         78.40 cm/s  LVOT Vmean:        53.800 cm/s  LVOT VTI:          0.183 m  LVOT/AV VTI ratio: 0.20  AI PHT:            438 msec    AORTA  Ao Root diam: 3.40 cm  Ao Asc diam:  3.80 cm   MITRAL VALVE  MV Area (PHT): 2.66 cm     SHUNTS  MV Decel Time: 286 msec     Systemic VTI:  0.18 m  MV E velocity: 68.40 cm/s   Systemic Diam: 2.10 cm  MV A velocity: 108.50 cm/s  MV E/A ratio:  0.63   Recent Labs: 11/15/2023: ALT 12; BUN 19; Creatinine, Ser 0.94; Hemoglobin 13.1; Platelets 272; Potassium 4.4; Sodium 136    Wt Readings from Last 3 Encounters:  11/22/23 174 lb 3.2 oz (79 kg)  11/15/23 173 lb (78.5 kg)  09/03/23 176 lb (79.8 kg)    Assessment and Plan:   1. Severe Aortic Valve Stenosis: She has severe, stage D3 (paradoxical low flow/low gradient) aortic valve stenosis. She has NYHA class 3 symptoms. I have personally reviewed the echo images. The aortic valve is  thickened and calcified with limited leaflet mobility. I think she would benefit from AVR. Given advanced age, she is not a good candidate for conventional AVR by surgical approach. I think she may be a good candidate for TAVR.   I have reviewed the natural history of aortic stenosis with the patient and their family members  who are present today. We have  discussed the limitations of medical therapy and the poor prognosis associated with symptomatic aortic stenosis. We have reviewed potential treatment options, including palliative medical therapy, conventional surgical aortic valve replacement, and transcatheter aortic valve replacement. We discussed treatment options in the context of the patient's specific comorbid medical conditions.   She would like to proceed with planning for TAVR. I will arrange a right and left heart catheterization at [facility] 11/28/23 at 1pm. Risks and benefits of the cath procedure and the valve procedure are reviewed with the patient. After the cath, she will have a cardiac CT, CTA of the chest/abdomen and pelvis and will then be referred to see one of the CT surgeons on our TAVR team.  She will hold Eliquis  and will hold this pre-cath.      Labs/ tests ordered today include:  No orders of the defined types were placed in this encounter.  Disposition:   F/U will be arranged with the structural team  Signed, Antoinette Batman, MD, [facility] 11/22/2023 3:07 PM    [facility] [address] [place], [place], [place]  [address] Phone: [phone]; Fax: [phone]

## 2023-11-22 NOTE — Progress Notes (Signed)
 [facility] Consult Note  Chief Complaint  Patient presents with   New Patient (Initial Visit)    Aortic stenosis    History of Present Illness: 89 yo female with history of paroxysmal atrial fibrillation, symptomatic bradycardia post pacemaker, lymphoma, HTN, HLD, hypothyroidism, GERD and aortic valve disease who is here today as a new consult, referred by Dr. Audery Blazing, for further discussion regarding her aortic stenosis/aortic insufficiency and possible TAVR. She was admitted in September 2021 after a syncopal episode and was found to be bradycardic. A permanent pacemaker was placed. She had atrial fibrillation and SVT noted on her device. She is on Eliquis . Stress myoview  without ischemia in September 2024. Echo April 2025 with LVEF=60-65%. Normal RV function. Mild mitral regurgitation. Severe paradoxical low flow/low gradient aortic stenosis with mean gradient 36 mmHg, AVA 0.69 cm2, SVI 35, DI 0.20.   She tells me today that she has been having dyspnea on exertion, progressive in severity over the last few months. She has had some chest pain and dizziness/near syncope but no syncope. No LE edema. She lives in [place], [place] with her daughter. She is retired from a Theme park manager. She has no active dental problems.   Primary Care Physician: Cherre Cornish, NP Primary Cardiologist: Audery Blazing Referring Cardiologist: Audery Blazing  Past Medical History:  Diagnosis Date   Acquired trigger finger 08/05/2013   Aortic stenosis    Arthritis    Asthma    Atrial fibrillation (HCC)    paroxysmal , addressed  with metoprolol     Callus of foot 09/03/2018   Chronic sinusitis    Complication of anesthesia    Dyspnea    sob on exertion, reports at her pre-op today 09-13-2018 that her cardiologist has planned for her to do a ECHO for evaluation    Family history of adverse reaction to anesthesia    sister is hard to wake up from Anesthesia   Follicular lymphoma (HCC)    received Chemotherapy (2  years ago)  and radiation (last 12/2017)   Hyperlipidemia    Hypertension    Hypothyroidism    Meniere's disease    PONV (postoperative nausea and vomiting)    Primary localized osteoarthritis of left knee 09/24/2018   Primary localized osteoarthritis of right knee 06/18/2018   Reflux    Sebaceous cyst 09/03/2018   Shingles 10/24/2019    Past Surgical History:  Procedure Laterality Date   ABDOMINAL HYSTERECTOMY     BONE MARROW BIOPSY  06/16/2014   at [facility]   CARPAL TUNNEL RELEASE     bilateral   CHOLECYSTECTOMY     KNEE ARTHROSCOPY     LUMBAR FUSION     PACEMAKER IMPLANT N/A 03/30/2020   Procedure: PACEMAKER IMPLANT;  Surgeon: Jolly Needle, MD;  Location: [facility];  Service: Cardiovascular;  Laterality: N/A;   PARTIAL KNEE ARTHROPLASTY Right 06/18/2018   Procedure: UNICOMPARTMENTAL KNEE;  Surgeon: Osa Blase, MD;  Location: [facility];  Service: Orthopedics;  Laterality: Right;   PARTIAL KNEE ARTHROPLASTY Left 09/24/2018   Procedure: UNICOMPARTMENTAL KNEE;  Surgeon: Osa Blase, MD;  Location: [facility];  Service: Orthopedics;  Laterality: Left;   thumb surgery      Current Outpatient Medications  Medication Sig Dispense Refill   acyclovir  ointment (ZOVIRAX ) 5 % Apply 1 Application topically daily as needed. 30 g 1   albuterol  (VENTOLIN  HFA) 108 (90 Base) MCG/ACT inhaler Inhale 2 puffs into the lungs every 6 (six) hours as needed for wheezing or shortness  of breath. 8 g 0   amLODipine  (NORVASC ) 5 MG tablet TAKE 1 TABLET BY MOUTH DAILY 90 tablet 3   apixaban  (ELIQUIS ) 5 MG TABS tablet TAKE 1 TABLET BY MOUTH TWICE  DAILY 180 tablet 1   baclofen  (LIORESAL ) 10 MG tablet TAKE 1 TABLET BY MOUTH 3  TIMES DAILY AS NEEDED FOR  MUSCLE SPASMS 270 tablet 0   Calcipotriene 0.005 % solution Apply topically.     cephALEXin  (KEFLEX ) 500 MG capsule Take 1 capsule (500 mg total) by mouth 3 (three) times daily. 21 capsule 0   chlorthalidone  (HYGROTON ) 25 MG tablet Take 1 tablet  (25 mg total) by mouth daily. 30 tablet 11   Clobetasol  Propionate 0.05 % shampoo Apply 1 application topically daily. As needed for scalp irritation from psoriasis 236 mL 3   conjugated estrogens  (PREMARIN ) vaginal cream Place 1 Applicatorful vaginally daily. 42.5 g 12   diphenhydrAMINE  (BENADRYL ) 25 mg capsule Take 25 mg by mouth every 6 (six) hours as needed.     fluticasone  (FLOVENT  HFA) 110 MCG/ACT inhaler Inhale 2 puffs into the lungs 2 (two) times daily as needed (for shortness of breath or wheezing). 3 each 11   fluticasone -salmeterol (ADVAIR) 250-50 MCG/ACT AEPB Inhale into the lungs.     gabapentin  (NEURONTIN ) 100 MG capsule TAKE 1 CAPSULE BY MOUTH TWICE  DAILY AS NEEDED 180 capsule 1   gentamicin  ointment (GARAMYCIN ) 0.1 % Apply 1 Application topically 2 (two) times daily. 15 g 0   levothyroxine  (SYNTHROID ) 112 MCG tablet TAKE 1 TABLET BY MOUTH DAILY  BEFORE BREAKFAST 90 tablet 3   meclizine  (ANTIVERT ) 25 MG tablet Take 1 tablet (25 mg total) by mouth 3 (three) times daily as needed for dizziness. 30 tablet 0   meloxicam (MOBIC) 15 MG tablet Take 15 mg by mouth daily as needed for pain.     metoprolol  tartrate (LOPRESSOR ) 100 MG tablet TAKE 1 TABLET BY MOUTH TWICE  DAILY 180 tablet 3   mometasone  (ELOCON ) 0.1 % cream Apply 1 Application topically daily. To vaginal area as needed for redness/irritation. Can use twice daily for the first week 100 g 0   montelukast  (SINGULAIR ) 10 MG tablet Take by mouth.     omeprazole  (PRILOSEC) 40 MG capsule TAKE 1 CAPSULE BY MOUTH IN THE  MORNING AND MAY REPEAT DOSE IN  THE EVENING AS NEEDED FOR ACID  REFLUX / INDIGESTION 180 capsule 3   ondansetron  (ZOFRAN ) 4 MG tablet Take 1 tablet (4 mg total) by mouth every 8 (eight) hours as needed for nausea or vomiting. 30 tablet 3   pravastatin  (PRAVACHOL ) 80 MG tablet TAKE 1 TABLET BY MOUTH AT  BEDTIME 90 tablet 1   sennosides-docusate sodium  (SENOKOT-S) 8.6-50 MG tablet Take 2 tablets by mouth daily. (Patient  taking differently: Take 2 tablets by mouth as needed.) 30 tablet 1   sulfamethoxazole -trimethoprim  (BACTRIM  DS) 800-160 MG tablet Take 1 tablet by mouth 2 (two) times daily. 14 tablet 0   tetrahydrozoline 0.05 % ophthalmic solution Place 2 drops into both eyes daily as needed (for dry eyes).     traMADol (ULTRAM) 50 MG tablet SMARTSIG:1 Tablet(s) By Mouth Every 12 Hours PRN     triamcinolone  (NASACORT  ALLERGY 24HR) 55 MCG/ACT AERO nasal inhaler Place 2 sprays into the nose daily. 3 each 3   valsartan  (DIOVAN ) 80 MG tablet TAKE 1 TABLET BY MOUTH DAILY 90 tablet 2   No current facility-administered medications for this visit.    No Known Allergies  Social  History   Socioeconomic History   Marital status: Widowed    Spouse name: Not on file   Number of children: 1   Years of education: 53   Highest education level: 12th grade  Occupational History   Occupation: Advertising account planner    Comment: retired  Tobacco Use   Smoking status: Never   Smokeless tobacco: Never  Vaping Use   Vaping status: Never Used  Substance and Sexual Activity   Alcohol  use: No   Drug use: No   Sexual activity: Not Currently    Partners: Male  Other Topics Concern   Not on file  Social History Narrative   No exercise. 2 cups of coffee a day    Social Drivers of Corporate investment banker Strain: Low Risk  (09/03/2023)   Overall Financial Resource Strain (CARDIA)    Difficulty of Paying Living Expenses: Not very hard  Food Insecurity: No Food Insecurity (09/03/2023)   Hunger Vital Sign    Worried About Running Out of Food in the Last Year: Never true    Ran Out of Food in the Last Year: Never true  Transportation Needs: No Transportation Needs (09/03/2023)   PRAPARE - Administrator, Civil Service (Medical): No    Lack of Transportation (Non-Medical): No  Physical Activity: Insufficiently Active (09/03/2023)   Exercise Vital Sign    Days of Exercise per Week: 3 days    Minutes of Exercise per  Session: 20 min  Stress: No Stress Concern Present (09/03/2023)   [employer] of Occupational Health - Occupational Stress Questionnaire    Feeling of Stress : Only a little  Social Connections: Moderately Isolated (09/03/2023)   Social Connection and Isolation Panel [NHANES]    Frequency of Communication with Friends and Family: Three times a week    Frequency of Social Gatherings with Friends and Family: Once a week    Attends Religious Services: More than 4 times per year    Active Member of [employer] or Organizations: No    Attends Banker Meetings: Not on file    Marital Status: Widowed  Intimate Partner Violence: Unknown (02/08/2023)   Received from [facility]   HITS    Physically Hurt: Not on file    Insult or Talk Down To: Not on file    Threaten Physical Harm: Not on file    Scream or Curse: Not on file    Family History  Problem Relation Age of Onset   Skin cancer Mother    Stroke Mother    Prostate cancer Father    High blood pressure Father    Heart disease Father    Heart disease Other     Review of Systems:  As stated in the HPI and otherwise negative.   BP (!) 154/92   Pulse 68   Ht 5\' 5"  (1.651 m)   Wt 174 lb 3.2 oz (79 kg)   SpO2 92%   BMI 28.99 kg/m   Physical Examination: General: Well developed, well nourished, NAD  HEENT: OP clear, mucus membranes moist  SKIN: warm, dry. No rashes. Neuro: No focal deficits  Musculoskeletal: Muscle strength 5/5 all ext  Psychiatric: Mood and affect normal  Neck: No JVD, no carotid bruits, no thyromegaly, no lymphadenopathy.  Lungs:Clear bilaterally, no wheezes, rhonci, crackles Cardiovascular: Regular rate and rhythm. Loud, harsh, late peaking systolic murmur.  Abdomen:Soft. Bowel sounds present. Non-tender.  Extremities: No lower extremity edema. Pulses are 2 + in the bilateral  DP/PT.  EKG:  EKG is not ordered today. The ekg ordered today demonstrates   Echo 10/11/23: 1. Left ventricular  ejection fraction, by estimation, is 60 to 65%. The  left ventricle has normal function. The left ventricle has no regional  wall motion abnormalities. Left ventricular diastolic parameters are  consistent with Grade I diastolic  dysfunction (impaired relaxation). Elevated left ventricular end-diastolic  pressure.   2. Right ventricular systolic function is normal. The right ventricular  size is normal. Tricuspid regurgitation signal is inadequate for assessing  PA pressure.   3. The mitral valve is degenerative. Mild mitral valve regurgitation. No  evidence of mitral stenosis. Moderate mitral annular calcification.   4. The aortic valve is calcified. There is moderate calcification of the  aortic valve. There is moderate thickening of the aortic valve. Aortic  valve regurgitation is mild to moderate. Moderate to severe aortic valve  stenosis. Aortic regurgitation PHT  measures 438 msec. Aortic valve area, by VTI measures 0.69 cm. Aortic  valve mean gradient measures 36.0 mmHg. Aortic valve Vmax measures 3.58  m/s.   5. There is mild dilatation of the ascending aorta, measuring 38 mm.   6. The inferior vena cava is normal in size with greater than 50%  respiratory variability, suggesting right atrial pressure of 3 mmHg.   7. Compared to echo dated 02/22/2022, mean AVG has increased from 21 to  , Vmax increased from 2.88 to 3.17m/s and DI has decreased from 0.34  to 0.20.   FINDINGS   Left Ventricle: Left ventricular ejection fraction, by estimation, is 60  to 65%. The left ventricle has normal function. The left ventricle has no  regional wall motion abnormalities. The left ventricular internal cavity  size was normal in size. There is   no left ventricular hypertrophy. Left ventricular diastolic parameters  are consistent with Grade I diastolic dysfunction (impaired relaxation).  Elevated left ventricular end-diastolic pressure.   Right Ventricle: The right ventricular size  is normal. No increase in  right ventricular wall thickness. Right ventricular systolic function is  normal. Tricuspid regurgitation signal is inadequate for assessing PA  pressure.   Left Atrium: Left atrial size was normal in size.   Right Atrium: Right atrial size was normal in size.   Pericardium: There is no evidence of pericardial effusion.   Mitral Valve: The mitral valve is degenerative in appearance. There is  mild thickening of the mitral valve leaflet(s). There is mild  calcification of the mitral valve leaflet(s). Moderate mitral annular  calcification. Mild mitral valve regurgitation. No  evidence of mitral valve stenosis.   Tricuspid Valve: The tricuspid valve is normal in structure. Tricuspid  valve regurgitation is not demonstrated. No evidence of tricuspid  stenosis.   The aortic valve is calcified. There is moderate calcification of the  aortic valve. There is moderate thickening of the aortic valve. Aortic  valve regurgitation is mild to moderate. Moderate to severe aortic  stenosis is present.   Pulmonic Valve: The pulmonic valve was normal in structure. Pulmonic valve  regurgitation is trivial. No evidence of pulmonic stenosis.   Aorta: The aortic root is normal in size and structure. There is mild  dilatation of the ascending aorta, measuring 38 mm.   Venous: The inferior vena cava is normal in size with greater than 50%  respiratory variability, suggesting right atrial pressure of 3 mmHg.   IAS/Shunts: No atrial level shunt detected by color flow Doppler.   Additional Comments: A device lead is  visualized.     LEFT VENTRICLE  PLAX 2D  LVIDd:         5.10 cm   Diastology  LVIDs:         3.60 cm   LV e' medial:    4.68 cm/s  LV PW:         1.00 cm   LV E/e' medial:  14.6  LV IVS:        1.00 cm   LV e' lateral:   4.95 cm/s  LVOT diam:     2.10 cm   LV E/e' lateral: 13.8  LV SV:         63  LV SV Index:   34  LVOT Area:     3.46 cm     RIGHT  VENTRICLE             IVC  RV Basal diam:  3.50 cm     IVC diam: 1.60 cm  RV S prime:     11.19 cm/s  TAPSE (M-mode): 1.7 cm   LEFT ATRIUM             Index        RIGHT ATRIUM           Index  LA diam:        5.00 cm 2.67 cm/m   RA Area:     14.30 cm  LA Vol (A2C):   61.2 ml 32.67 ml/m  RA Volume:   35.70 ml  19.05 ml/m  LA Vol (A4C):   31.2 ml 16.65 ml/m  LA Biplane Vol: 47.0 ml 25.09 ml/m   AORTIC VALVE  AV Area (Vmax):    0.76 cm  AV Area (Vmean):   0.72 cm  AV Area (VTI):     0.69 cm  AV Vmax:           358.00 cm/s  AV Vmean:          260.000 cm/s  AV VTI:            0.918 m  AV Peak Grad:      51.3 mmHg  AV Mean Grad:      36.0 mmHg  LVOT Vmax:         78.40 cm/s  LVOT Vmean:        53.800 cm/s  LVOT VTI:          0.183 m  LVOT/AV VTI ratio: 0.20  AI PHT:            438 msec    AORTA  Ao Root diam: 3.40 cm  Ao Asc diam:  3.80 cm   MITRAL VALVE  MV Area (PHT): 2.66 cm     SHUNTS  MV Decel Time: 286 msec     Systemic VTI:  0.18 m  MV E velocity: 68.40 cm/s   Systemic Diam: 2.10 cm  MV A velocity: 108.50 cm/s  MV E/A ratio:  0.63   Recent Labs: 11/15/2023: ALT 12; BUN 19; Creatinine, Ser 0.94; Hemoglobin 13.1; Platelets 272; Potassium 4.4; Sodium 136    Wt Readings from Last 3 Encounters:  11/22/23 174 lb 3.2 oz (79 kg)  11/15/23 173 lb (78.5 kg)  09/03/23 176 lb (79.8 kg)    Assessment and Plan:   1. Severe Aortic Valve Stenosis: She has severe, stage D3 (paradoxical low flow/low gradient) aortic valve stenosis. She has NYHA class 3 symptoms. I have personally reviewed the echo images. The aortic valve is  thickened and calcified with limited leaflet mobility. I think she would benefit from AVR. Given advanced age, she is not a good candidate for conventional AVR by surgical approach. I think she may be a good candidate for TAVR.   I have reviewed the natural history of aortic stenosis with the patient and their family members  who are present today. We have  discussed the limitations of medical therapy and the poor prognosis associated with symptomatic aortic stenosis. We have reviewed potential treatment options, including palliative medical therapy, conventional surgical aortic valve replacement, and transcatheter aortic valve replacement. We discussed treatment options in the context of the patient's specific comorbid medical conditions.   She would like to proceed with planning for TAVR. I will arrange a right and left heart catheterization at [facility] 11/28/23 at 1pm. Risks and benefits of the cath procedure and the valve procedure are reviewed with the patient. After the cath, she will have a cardiac CT, CTA of the chest/abdomen and pelvis and will then be referred to see one of the CT surgeons on our TAVR team.  She will hold Eliquis  and will hold this pre-cath.      Labs/ tests ordered today include:  No orders of the defined types were placed in this encounter.  Disposition:   F/U will be arranged with the structural team  Signed, Antoinette Batman, MD, [facility] 11/22/2023 3:07 PM    [facility] [address] [place], [place], [place]  [address] Phone: [phone]; Fax: [phone]

## 2023-11-27 ENCOUNTER — Telehealth: Payer: Self-pay | Admitting: *Deleted

## 2023-11-27 NOTE — Telephone Encounter (Addendum)
 Cardiac Catheterization scheduled at [facility] for: Nov 28, 2023 1 PM Arrival time [facility] Main Entrance A at: 11 AM  Nothing to eat after midnight prior to procedure, clear liquids until 5 AM day of procedure.  Medication instructions: -Hold:  Eliquis -none 11/26/23 until post procedure   Chlorthalidone -AM of procedure   Losartan -PM prior to procedure-per protocol GFR < 60 (59) -Other usual morning medications can be taken with sips of water  including aspirin  81 mg.  Plan to go home the same day, you will only stay overnight if medically necessary.  You must have responsible adult to drive you home.  Someone must be with you the first 24 hours after you arrive home.  Reviewed procedure instructions with patient.

## 2023-11-28 ENCOUNTER — Encounter (HOSPITAL_COMMUNITY)
Admission: RE | Disposition: A | Discharge status: Home / Self Care | Payer: Self-pay | Source: Home / Self Care | Attending: Cardiovascular Disease

## 2023-11-28 ENCOUNTER — Other Ambulatory Visit: Payer: Self-pay

## 2023-11-28 ENCOUNTER — Ambulatory Visit (HOSPITAL_COMMUNITY)
Admission: RE | Admit: 2023-11-28 | Discharge: 2023-11-28 | Disposition: A | Discharge status: Home / Self Care | Attending: Cardiovascular Disease | Admitting: Cardiovascular Disease

## 2023-11-28 ENCOUNTER — Encounter (HOSPITAL_COMMUNITY): Payer: Self-pay | Admitting: Cardiovascular Disease

## 2023-11-28 DIAGNOSIS — Z7901 Long term (current) use of anticoagulants: Secondary | ICD-10-CM | POA: Diagnosis not present

## 2023-11-28 DIAGNOSIS — I2510 Atherosclerotic heart disease of native coronary artery without angina pectoris: Secondary | ICD-10-CM | POA: Insufficient documentation

## 2023-11-28 DIAGNOSIS — I350 Nonrheumatic aortic (valve) stenosis: Secondary | ICD-10-CM | POA: Diagnosis not present

## 2023-11-28 HISTORY — PX: RIGHT HEART CATH AND CORONARY ANGIOGRAPHY: CATH118264

## 2023-11-28 SURGERY — RIGHT HEART CATH AND CORONARY ANGIOGRAPHY
Anesthesia: LOCAL

## 2023-11-28 MED ORDER — ASPIRIN 81 MG PO CHEW: 81.0000 mg | CHEWABLE_TABLET | ORAL | Status: DC

## 2023-11-28 MED ORDER — ACETAMINOPHEN 325 MG PO TABS: 650.0000 mg | ORAL_TABLET | ORAL | Status: DC | PRN

## 2023-11-28 MED ORDER — VERAPAMIL HCL 2.5 MG/ML IV SOLN
INTRAVENOUS | Status: AC
  Filled 2023-11-28: qty 2

## 2023-11-28 MED ORDER — SODIUM CHLORIDE 0.9 % IV SOLN: INTRAVENOUS | Status: AC

## 2023-11-28 MED ORDER — FENTANYL CITRATE (PF) 100 MCG/2ML IJ SOLN
INTRAMUSCULAR | Status: DC | PRN
  Administered 2023-11-28: 25 ug via INTRAVENOUS

## 2023-11-28 MED ORDER — SODIUM CHLORIDE 0.9% FLUSH: 3.0000 mL | Freq: Two times a day (BID) | INTRAVENOUS | Status: DC

## 2023-11-28 MED ORDER — MIDAZOLAM HCL 2 MG/2ML IJ SOLN
INTRAMUSCULAR | Status: DC | PRN
  Administered 2023-11-28: 1 mg via INTRAVENOUS

## 2023-11-28 MED ORDER — ONDANSETRON HCL 4 MG/2ML IJ SOLN: 4.0000 mg | Freq: Four times a day (QID) | INTRAMUSCULAR | Status: DC | PRN

## 2023-11-28 MED ORDER — FENTANYL CITRATE (PF) 100 MCG/2ML IJ SOLN
INTRAMUSCULAR | Status: AC
  Filled 2023-11-28: qty 2

## 2023-11-28 MED ORDER — SODIUM CHLORIDE 0.9 % IV SOLN: 250.0000 mL | INTRAVENOUS | Status: DC | PRN

## 2023-11-28 MED ORDER — LABETALOL HCL 5 MG/ML IV SOLN: 10.0000 mg | INTRAVENOUS | Status: DC | PRN

## 2023-11-28 MED ORDER — HEPARIN SODIUM (PORCINE) 1000 UNIT/ML IJ SOLN
INTRAMUSCULAR | Status: AC
Start: 2023-11-28 — End: ?
  Filled 2023-11-28: qty 10

## 2023-11-28 MED ORDER — SODIUM CHLORIDE 0.9 % WEIGHT BASED INFUSION: 1.0000 mL/kg/h | INTRAVENOUS | Status: DC

## 2023-11-28 MED ORDER — IOHEXOL 350 MG/ML SOLN
INTRAVENOUS | Status: DC | PRN
  Administered 2023-11-28: 43 mL

## 2023-11-28 MED ORDER — HEPARIN SODIUM (PORCINE) 1000 UNIT/ML IJ SOLN
INTRAMUSCULAR | Status: DC | PRN
  Administered 2023-11-28: 4000 [IU] via INTRAVENOUS

## 2023-11-28 MED ORDER — MIDAZOLAM HCL 2 MG/2ML IJ SOLN
INTRAMUSCULAR | Status: AC
  Filled 2023-11-28: qty 2

## 2023-11-28 MED ORDER — LIDOCAINE HCL (PF) 1 % IJ SOLN
INTRAMUSCULAR | Status: AC
  Filled 2023-11-28: qty 30

## 2023-11-28 MED ORDER — HYDRALAZINE HCL 20 MG/ML IJ SOLN: 10.0000 mg | INTRAMUSCULAR | Status: DC | PRN

## 2023-11-28 MED ORDER — HEPARIN (PORCINE) IN NACL 1000-0.9 UT/500ML-% IV SOLN
INTRAVENOUS | Status: DC | PRN
Start: 2023-11-28 — End: 2023-11-28
  Administered 2023-11-28 (×2): 500 mL

## 2023-11-28 MED ORDER — VERAPAMIL HCL 2.5 MG/ML IV SOLN
INTRAVENOUS | Status: DC | PRN
  Administered 2023-11-28: 10 mL via INTRA_ARTERIAL

## 2023-11-28 MED ORDER — SODIUM CHLORIDE 0.9% FLUSH: 3.0000 mL | INTRAVENOUS | Status: DC | PRN

## 2023-11-28 MED ORDER — SODIUM CHLORIDE 0.9 % WEIGHT BASED INFUSION
3.0000 mL/kg/h | INTRAVENOUS | Status: AC
Start: 2023-11-28 — End: 2023-11-28

## 2023-11-28 MED ORDER — LIDOCAINE HCL (PF) 1 % IJ SOLN
INTRAMUSCULAR | Status: DC | PRN
  Administered 2023-11-28 (×2): 2 mL via INTRADERMAL

## 2023-11-28 SURGICAL SUPPLY — 8 items
CATH 5FR JL3.5 JR4 ANG PIG MP (CATHETERS) IMPLANT
CATH BALLN WEDGE 5F 110CM (CATHETERS) IMPLANT
DEVICE RAD COMP TR BAND LRG (VASCULAR PRODUCTS) IMPLANT
GLIDESHEATH SLEND SS 6F .021 (SHEATH) IMPLANT
GUIDEWIRE INQWIRE 1.5J.035X260 (WIRE) IMPLANT
PACK CARDIAC CATHETERIZATION (CUSTOM PROCEDURE TRAY) ×1 IMPLANT
SET ATX-X65L (MISCELLANEOUS) IMPLANT
SHEATH GLIDE SLENDER 4/5FR (SHEATH) IMPLANT

## 2023-11-28 NOTE — Progress Notes (Signed)
 TR band removed at 1510, gauze dressing applied. Right radial level 0, clean, dry, and intact.

## 2023-11-28 NOTE — Interval H&P Note (Signed)
 History and Physical Interval Note:  11/28/2023 12:39 PM  [name]  has presented today for surgery, with the diagnosis of aortic stenosis.  The various methods of treatment have been discussed with the patient and family. After consideration of risks, benefits and other options for treatment, the patient has consented to  Procedure(s): RIGHT/LEFT HEART CATH AND CORONARY ANGIOGRAPHY (N/A) as a surgical intervention.  The patient's history has been reviewed, patient examined, no change in status, stable for surgery.  I have reviewed the patient's chart and labs.  Questions were answered to the patient's satisfaction.    Cath Lab Visit (complete for each Cath Lab visit)  Clinical Evaluation Leading to the Procedure:   ACS: No.  Non-ACS:    Anginal Classification: No Symptoms  Anti-ischemic medical therapy: Maximal Therapy (2 or more classes of medications)  Non-Invasive Test Results: No non-invasive testing performed  Prior CABG: No previous CABG        [name]

## 2023-11-28 NOTE — Discharge Instructions (Addendum)
 Restart Eliquis  tomorrow if no bleeding from cath sites in right arm.   Drink plenty of fluids for 48 hours and keep wrist elevated at heart level for 24 hours  Radial Site Care   This sheet gives you information about how to care for yourself after your procedure. Your health care provider may also give you more specific instructions. If you have problems or questions, contact your health care provider. What can I expect after the procedure? After the procedure, it is common to have: Bruising and tenderness at the catheter insertion area. Follow these instructions at home: Medicines Take over-the-counter and prescription medicines only as told by your health care provider. Insertion site care Follow instructions from your health care provider about how to take care of your insertion site. Make sure you: Wash your hands with soap and water  before you change your bandage (dressing). If soap and water  are not available, use hand sanitizer. Remove your dressing as told by your health care provider. In 24 hours Check your insertion site every day for signs of infection. Check for: Redness, swelling, or pain. Fluid or blood. Pus or a bad smell. Warmth. Do not take baths, swim, or use a hot tub until your health care provider approves. You may shower 24-48 hours after the procedure, or as directed by your health care provider. Remove the dressing and gently wash the site with plain soap and water . Pat the area dry with a clean towel. Do not rub the site. That could cause bleeding. Do not apply powder or lotion to the site. Activity   For 24 hours after the procedure, or as directed by your health care provider: Do not flex or bend the affected arm. Do not push or pull heavy objects with the affected arm. Do not drive yourself home from the hospital or clinic. You may drive 24 hours after the procedure unless your health care provider tells you not to. Do not operate machinery or power  tools. Do not lift anything that is heavier than 10 lb (4.5 kg), or the limit that you are told, until your health care provider says that it is safe.  For 4 days Ask your health care provider when it is okay to: Return to work or school. Resume usual physical activities or sports. Resume sexual activity. General instructions If the catheter site starts to bleed, raise your arm and put firm pressure on the site. If the bleeding does not stop, get help right away. This is a medical emergency. If you went home on the same day as your procedure, a responsible adult should be with you for the first 24 hours after you arrive home. Keep all follow-up visits as told by your health care provider. This is important. Contact a health care provider if: You have a fever. You have redness, swelling, or yellow drainage around your insertion site. Get help right away if: You have unusual pain at the radial site. The catheter insertion area swells very fast. The insertion area is bleeding, and the bleeding does not stop when you hold steady pressure on the area. Your arm or hand becomes pale, cool, tingly, or numb. These symptoms may represent a serious problem that is an emergency. Do not wait to see if the symptoms will go away. Get medical help right away. Call your local emergency services (911 in the [place] Do not drive yourself to the hospital. Summary After the procedure, it is common to have bruising and tenderness at the site.  Follow instructions from your health care provider about how to take care of your radial site wound. Check the wound every day for signs of infection. Do not lift anything that is heavier than 10 lb (4.5 kg), or the limit that you are told, until your health care provider says that it is safe. This information is not intended to replace advice given to you by your health care provider. Make sure you discuss any questions you have with your health care provider. Document  Revised: 08/01/2017 Document Reviewed: 08/01/2017 Elsevier Patient Education  2020 [employer].

## 2023-11-29 LAB — POCT I-STAT EG7
Acid-Base Excess: 1 mmol/L (ref 0.0–2.0)
Bicarbonate: 26.3 mmol/L (ref 20.0–28.0)
Calcium, Ion: 1.09 mmol/L — ABNORMAL LOW (ref 1.15–1.40)
HCT: 34 % — ABNORMAL LOW (ref 36.0–46.0)
Hemoglobin: 11.6 g/dL — ABNORMAL LOW (ref 12.0–15.0)
O2 Saturation: 67 %
Potassium: 3.4 mmol/L — ABNORMAL LOW (ref 3.5–5.1)
Sodium: 134 mmol/L — ABNORMAL LOW (ref 135–145)
TCO2: 28 mmol/L (ref 22–32)
pCO2, Ven: 45 mmHg (ref 44–60)
pH, Ven: 7.375 (ref 7.25–7.43)
pO2, Ven: 36 mmHg (ref 32–45)

## 2023-11-29 LAB — POCT I-STAT 7, (LYTES, BLD GAS, ICA,H+H)
Acid-Base Excess: 1 mmol/L (ref 0.0–2.0)
Bicarbonate: 26.2 mmol/L (ref 20.0–28.0)
Calcium, Ion: 1.17 mmol/L (ref 1.15–1.40)
HCT: 34 % — ABNORMAL LOW (ref 36.0–46.0)
Hemoglobin: 11.6 g/dL — ABNORMAL LOW (ref 12.0–15.0)
O2 Saturation: 92 %
Potassium: 3.6 mmol/L (ref 3.5–5.1)
Sodium: 134 mmol/L — ABNORMAL LOW (ref 135–145)
TCO2: 28 mmol/L (ref 22–32)
pCO2 arterial: 43.4 mmHg (ref 32–48)
pH, Arterial: 7.389 (ref 7.35–7.45)
pO2, Arterial: 65 mmHg — ABNORMAL LOW (ref 83–108)

## 2023-12-07 DIAGNOSIS — L308 Other specified dermatitis: Secondary | ICD-10-CM | POA: Diagnosis not present

## 2023-12-07 DIAGNOSIS — L309 Dermatitis, unspecified: Secondary | ICD-10-CM | POA: Diagnosis not present

## 2023-12-10 DIAGNOSIS — M19042 Primary osteoarthritis, left hand: Secondary | ICD-10-CM | POA: Diagnosis not present

## 2023-12-12 ENCOUNTER — Telehealth: Payer: Self-pay | Admitting: Cardiology

## 2023-12-12 NOTE — Telephone Encounter (Signed)
 Spoke with patient regarding rash Stated rash started back in the fall, thought that was just life and kept taking  Rash kept getting worse, spreading and itching more  On chest, arms, and back  She had biopsy and was told they felt drug related  November start on Chlorthalidone  but had prior Not sure if had when she started on Amlodipine  in September, can not remember Would like someone to review her medications to determine which one is likely to cause rash Blood pressure running 120'145 which she states is much better than has been   Will forward to Dr Audery Blazing and Margeret Sheer D team for review

## 2023-12-12 NOTE — Telephone Encounter (Signed)
 Spoke with pt, Aware of dr Ludwig Clarks recommendations.

## 2023-12-12 NOTE — Telephone Encounter (Signed)
 Pt c/o medication issue:  1. Name of Medication: unsure  2. How are you currently taking this medication (dosage and times per day)? -  3. Are you having a reaction (difficulty breathing--STAT)? No  4. What is your medication issue? Rash on chest, back and arms for some time now which is worsening. Pt thinks is coming from one of her medications but just not sure which one

## 2023-12-13 DIAGNOSIS — M79642 Pain in left hand: Secondary | ICD-10-CM | POA: Diagnosis not present

## 2023-12-13 DIAGNOSIS — M25532 Pain in left wrist: Secondary | ICD-10-CM | POA: Diagnosis not present

## 2023-12-13 DIAGNOSIS — M79645 Pain in left finger(s): Secondary | ICD-10-CM | POA: Diagnosis not present

## 2023-12-14 ENCOUNTER — Other Ambulatory Visit: Payer: Self-pay

## 2023-12-14 DIAGNOSIS — I350 Nonrheumatic aortic (valve) stenosis: Secondary | ICD-10-CM

## 2023-12-18 DIAGNOSIS — M79642 Pain in left hand: Secondary | ICD-10-CM | POA: Diagnosis not present

## 2023-12-18 DIAGNOSIS — M25532 Pain in left wrist: Secondary | ICD-10-CM | POA: Diagnosis not present

## 2023-12-18 DIAGNOSIS — M79645 Pain in left finger(s): Secondary | ICD-10-CM | POA: Diagnosis not present

## 2023-12-19 ENCOUNTER — Ambulatory Visit (HOSPITAL_COMMUNITY)
Admission: RE | Admit: 2023-12-19 | Discharge: 2023-12-19 | Disposition: A | Discharge status: Home / Self Care | Source: Ambulatory Visit | Attending: Cardiovascular Disease | Admitting: Cardiovascular Disease

## 2023-12-19 DIAGNOSIS — K429 Umbilical hernia without obstruction or gangrene: Secondary | ICD-10-CM | POA: Diagnosis not present

## 2023-12-19 DIAGNOSIS — R222 Localized swelling, mass and lump, trunk: Secondary | ICD-10-CM | POA: Diagnosis not present

## 2023-12-19 DIAGNOSIS — I701 Atherosclerosis of renal artery: Secondary | ICD-10-CM | POA: Diagnosis not present

## 2023-12-19 DIAGNOSIS — I517 Cardiomegaly: Secondary | ICD-10-CM | POA: Diagnosis not present

## 2023-12-19 DIAGNOSIS — N281 Cyst of kidney, acquired: Secondary | ICD-10-CM | POA: Insufficient documentation

## 2023-12-19 DIAGNOSIS — K7689 Other specified diseases of liver: Secondary | ICD-10-CM | POA: Diagnosis not present

## 2023-12-19 DIAGNOSIS — Z01818 Encounter for other preprocedural examination: Secondary | ICD-10-CM | POA: Diagnosis not present

## 2023-12-19 DIAGNOSIS — I350 Nonrheumatic aortic (valve) stenosis: Secondary | ICD-10-CM | POA: Diagnosis not present

## 2023-12-19 DIAGNOSIS — I700 Atherosclerosis of aorta: Secondary | ICD-10-CM | POA: Diagnosis not present

## 2023-12-19 DIAGNOSIS — Z48812 Encounter for surgical aftercare following surgery on the circulatory system: Secondary | ICD-10-CM | POA: Diagnosis not present

## 2023-12-19 DIAGNOSIS — R932 Abnormal findings on diagnostic imaging of liver and biliary tract: Secondary | ICD-10-CM | POA: Diagnosis not present

## 2023-12-19 MED ORDER — IOHEXOL 350 MG/ML SOLN
100.0000 mL | Freq: Once | INTRAVENOUS | Status: AC | PRN
  Administered 2023-12-19: 100 mL via INTRAVENOUS

## 2024-01-01 ENCOUNTER — Ambulatory Visit (INDEPENDENT_AMBULATORY_CARE_PROVIDER_SITE_OTHER)

## 2024-01-01 DIAGNOSIS — I480 Paroxysmal atrial fibrillation: Secondary | ICD-10-CM

## 2024-01-02 LAB — CUP PACEART REMOTE DEVICE CHECK
Battery Remaining Longevity: 130 mo
Battery Voltage: 3.01 V
Brady Statistic AP VP Percent: 7.17 %
Brady Statistic AP VS Percent: 0 %
Brady Statistic AS VP Percent: 90.55 %
Brady Statistic AS VS Percent: 2.28 %
Brady Statistic RA Percent Paced: 8.45 %
Brady Statistic RV Percent Paced: 97.71 %
Date Time Interrogation Session: 20250624124716
Implantable Lead Connection Status: 753985
Implantable Lead Connection Status: 753985
Implantable Lead Implant Date: 20210921
Implantable Lead Implant Date: 20210921
Implantable Lead Location: 753859
Implantable Lead Location: 753860
Implantable Lead Model: 3830
Implantable Lead Model: 5076
Implantable Pulse Generator Implant Date: 20210921
Lead Channel Impedance Value: 266 Ohm
Lead Channel Impedance Value: 323 Ohm
Lead Channel Impedance Value: 342 Ohm
Lead Channel Impedance Value: 475 Ohm
Lead Channel Pacing Threshold Amplitude: 0.625 V
Lead Channel Pacing Threshold Amplitude: 0.875 V
Lead Channel Pacing Threshold Pulse Width: 0.4 ms
Lead Channel Pacing Threshold Pulse Width: 0.4 ms
Lead Channel Sensing Intrinsic Amplitude: 1.875 mV
Lead Channel Sensing Intrinsic Amplitude: 1.875 mV
Lead Channel Sensing Intrinsic Amplitude: 17.625 mV
Lead Channel Sensing Intrinsic Amplitude: 17.625 mV
Lead Channel Setting Pacing Amplitude: 1.5 V
Lead Channel Setting Pacing Amplitude: 2 V
Lead Channel Setting Pacing Pulse Width: 0.4 ms
Lead Channel Setting Sensing Sensitivity: 0.9 mV
Zone Setting Status: 755011

## 2024-01-04 ENCOUNTER — Telehealth: Payer: Self-pay | Admitting: Medical-Surgical

## 2024-01-04 ENCOUNTER — Ambulatory Visit
Attending: Thoracic Surgery (Cardiothoracic Vascular Surgery) | Admitting: Thoracic Surgery (Cardiothoracic Vascular Surgery)

## 2024-01-04 ENCOUNTER — Encounter: Payer: Self-pay | Admitting: Thoracic Surgery (Cardiothoracic Vascular Surgery)

## 2024-01-04 ENCOUNTER — Ambulatory Visit (INDEPENDENT_AMBULATORY_CARE_PROVIDER_SITE_OTHER): Admitting: Medical-Surgical

## 2024-01-04 ENCOUNTER — Encounter: Payer: Self-pay | Admitting: Medical-Surgical

## 2024-01-04 VITALS — BP 160/76 | HR 81 | Resp 20 | Ht 65.0 in | Wt 177.1 lb

## 2024-01-04 VITALS — BP 157/82 | HR 81 | Resp 20 | Ht 65.0 in | Wt 177.8 lb

## 2024-01-04 DIAGNOSIS — I350 Nonrheumatic aortic (valve) stenosis: Secondary | ICD-10-CM | POA: Diagnosis not present

## 2024-01-04 DIAGNOSIS — L309 Dermatitis, unspecified: Secondary | ICD-10-CM | POA: Diagnosis not present

## 2024-01-04 NOTE — Progress Notes (Unsigned)
 [address]       [place] [address]             [phone]        [name] [facility] Medical Record #[id number] Date of Birth: 07/24/1934  Referring: Pietro [name] RAMAN, MD Primary Care: Willo Mini, NP Primary Cardiologist:Brian Pietro, MD  Chief Complaint:    Chief Complaint  Patient presents with  . Aortic Stenosis    TAVR evaluation, review all studies    History of Present Illness:     [name] is a 89 y.o. female presents for surgical evaluation of severe aortic stenosis.  She has a history of atrial fibrillation, bradycardia s/p PPM placement, lymphoma, CAD, and HTN.    89 yo female with history of paroxysmal atrial fibrillation, symptomatic bradycardia post pacemaker, lymphoma, HTN, HLD, hypothyroidism, GERD and aortic valve disease who is here today as a new consult, referred by Dr. Pietro, for further discussion regarding her aortic stenosis/aortic insufficiency and possible TAVR. She was admitted in September 2021 after a syncopal episode and was found to be bradycardic. A permanent pacemaker was placed. She had atrial fibrillation and SVT noted on her device. She is on Eliquis . Stress myoview  without ischemia in September 2024. Echo April 2025 with LVEF=60-65%. Normal RV function. Mild mitral regurgitation. Severe paradoxical low flow/low gradient aortic stenosis with mean gradient 36 mmHg, AVA 0.69 cm2, SVI 35, DI 0.20.    She tells me today that she has been having dyspnea on exertion, progressive in severity over the last few months. She has had some chest pain and dizziness/near syncope but no syncope. No LE edema. She lives in [place], [place] with her daughter. She is retired from a [employer] manager. She has no active dental problems.     Past Medical History:  Diagnosis Date  . Acquired trigger finger 08/05/2013  . Aortic stenosis   . Arthritis   . Asthma   . Atrial fibrillation (HCC)    paroxysmal , addressed  with metoprolol     . Callus of foot 09/03/2018  . Chronic sinusitis   . Complication of anesthesia   . Dyspnea    sob on exertion, reports at her pre-op today 09-13-2018 that her cardiologist has planned for her to do a ECHO for evaluation   . Family history of adverse reaction to anesthesia    sister is hard to wake up from Anesthesia  . Follicular lymphoma (HCC)    received Chemotherapy (2 years ago)  and radiation (last 12/2017)  . Hyperlipidemia   . Hypertension   . Hypothyroidism   . Meniere's disease   . PONV (postoperative nausea and vomiting)   . Primary localized osteoarthritis of left knee 09/24/2018  . Primary localized osteoarthritis of right knee 06/18/2018  . Reflux   . Sebaceous cyst 09/03/2018  . Shingles 10/24/2019    Past Surgical History:  Procedure Laterality Date  . ABDOMINAL HYSTERECTOMY    . BONE MARROW BIOPSY  06/16/2014   at [facility]  . CARPAL TUNNEL RELEASE     bilateral  . CHOLECYSTECTOMY    . KNEE ARTHROSCOPY    . LUMBAR FUSION    . PACEMAKER IMPLANT N/A 03/30/2020   Procedure: PACEMAKER IMPLANT;  Surgeon: Kelsie Agent, MD;  Location: [facility] INVASIVE CV LAB;  Service: Cardiovascular;  Laterality: N/A;  . PARTIAL KNEE ARTHROPLASTY Right 06/18/2018   Procedure: UNICOMPARTMENTAL KNEE;  Surgeon: Josefina Chew, MD;  Location: [facility] ORS;  Service: Orthopedics;  Laterality: Right;  . PARTIAL KNEE ARTHROPLASTY Left 09/24/2018   Procedure: UNICOMPARTMENTAL KNEE;  Surgeon: Josefina Chew, MD;  Location: [facility] ORS;  Service: Orthopedics;  Laterality: Left;  . RIGHT HEART CATH AND CORONARY ANGIOGRAPHY N/A 11/28/2023   Procedure: RIGHT HEART CATH AND CORONARY ANGIOGRAPHY;  Surgeon: Verlin Lonni BIRCH, MD;  Location: [facility] INVASIVE CV LAB;  Service: Cardiovascular;  Laterality: N/A;  . thumb surgery      Social History:  Social History   Tobacco Use  Smoking Status Never  Smokeless Tobacco Never    Social History   Substance and Sexual Activity  Alcohol  Use No     No  Known Allergies    Current Outpatient Medications  Medication Sig Dispense Refill  . acyclovir  ointment (ZOVIRAX ) 5 % Apply 1 Application topically daily as needed. 30 g 1  . albuterol  (VENTOLIN  HFA) 108 (90 Base) MCG/ACT inhaler Inhale 2 puffs into the lungs every 6 (six) hours as needed for wheezing or shortness of breath. 8 g 0  . amLODipine  (NORVASC ) 5 MG tablet TAKE 1 TABLET BY MOUTH DAILY (Patient taking differently: Take 5 mg by mouth at bedtime.) 90 tablet 3  . apixaban  (ELIQUIS ) 5 MG TABS tablet TAKE 1 TABLET BY MOUTH TWICE  DAILY 180 tablet 1  . baclofen  (LIORESAL ) 10 MG tablet TAKE 1 TABLET BY MOUTH 3  TIMES DAILY AS NEEDED FOR  MUSCLE SPASMS 270 tablet 0  . clobetasol  (TEMOVATE ) 0.05 % external solution Apply 1 Application topically daily as needed (Psoriasis).    . Clobetasol  Propionate 0.05 % shampoo Apply 1 application topically daily. As needed for scalp irritation from psoriasis (Patient taking differently: Apply 1 application  topically daily as needed (As needed for scalp irritation from psoriasis).) 236 mL 3  . diphenhydrAMINE  (BENADRYL ) 25 mg capsule Take 25 mg by mouth every 6 (six) hours as needed for itching.    . fluticasone -salmeterol (ADVAIR) 250-50 MCG/ACT AEPB Inhale 1 puff into the lungs daily as needed (Shortness of breath).    . gabapentin  (NEURONTIN ) 100 MG capsule TAKE 1 CAPSULE BY MOUTH TWICE  DAILY AS NEEDED 180 capsule 1  . gentamicin  ointment (GARAMYCIN ) 0.1 % Apply 1 Application topically 2 (two) times daily. 15 g 0  . levothyroxine  (SYNTHROID ) 112 MCG tablet TAKE 1 TABLET BY MOUTH DAILY  BEFORE BREAKFAST 90 tablet 3  . meclizine  (ANTIVERT ) 25 MG tablet Take 1 tablet (25 mg total) by mouth 3 (three) times daily as needed for dizziness. 30 tablet 0  . meloxicam (MOBIC) 15 MG tablet Take 15 mg by mouth daily as needed for pain.    . metoprolol  tartrate (LOPRESSOR ) 100 MG tablet TAKE 1 TABLET BY MOUTH TWICE  DAILY 180 tablet 3  . mometasone  (ELOCON ) 0.1 %  cream Apply 1 Application topically daily. To vaginal area as needed for redness/irritation. Can use twice daily for the first week 100 g 0  . Multiple Vitamins-Minerals (PRESERVISION AREDS PO) Take 1 tablet by mouth 2 (two) times daily.    . omeprazole  (PRILOSEC) 40 MG capsule TAKE 1 CAPSULE BY MOUTH IN THE  MORNING AND MAY REPEAT DOSE IN  THE EVENING AS NEEDED FOR ACID  REFLUX / INDIGESTION 180 capsule 3  . ondansetron  (ZOFRAN ) 4 MG tablet Take 1 tablet (4 mg total) by mouth every 8 (eight) hours as needed for nausea or vomiting. 30 tablet 3  . pravastatin  (PRAVACHOL ) 80 MG tablet TAKE 1 TABLET BY MOUTH AT  BEDTIME 90 tablet 1  .  sennosides-docusate sodium  (SENOKOT-S) 8.6-50 MG tablet Take 2 tablets by mouth daily. (Patient taking differently: Take 2 tablets by mouth daily as needed for constipation.) 30 tablet 1  . tetrahydrozoline 0.05 % ophthalmic solution Place 2 drops into both eyes daily as needed (for dry eyes).    . traMADol (ULTRAM) 50 MG tablet Take 50 mg by mouth daily as needed.    . triamcinolone  (NASACORT  ALLERGY 24HR) 55 MCG/ACT AERO nasal inhaler Place 2 sprays into the nose daily. (Patient taking differently: Place 2 sprays into the nose daily as needed (Congestion).) 3 each 3  . valsartan  (DIOVAN ) 80 MG tablet TAKE 1 TABLET BY MOUTH DAILY (Patient taking differently: Take 80 mg by mouth at bedtime.) 90 tablet 2   No current facility-administered medications for this visit.    (Not in a hospital admission)   Family History  Problem Relation Age of Onset  . Skin cancer Mother   . Stroke Mother   . Prostate cancer Father   . High blood pressure Father   . Heart disease Father   . Heart disease Other      Review of Systems:   ROS    Physical Exam: BP (!) 157/82 (BP Location: Left Arm, Patient Position: Sitting, Cuff Size: Normal)   Pulse 81   Resp 20   Ht 5' 5 (1.651 m)   Wt 177 lb 12.8 oz (80.6 kg)   SpO2 96% Comment: RA  BMI 29.59 kg/m  Physical Exam     Diagnostic Studies & Laboratory data:    Left Heart Catherization:  Intervention Echo: IMPRESSIONS     1. Left ventricular ejection fraction, by estimation, is 60 to 65%. The  left ventricle has normal function. The left ventricle has no regional  wall motion abnormalities. Left ventricular diastolic parameters are  consistent with Grade I diastolic  dysfunction (impaired relaxation). Elevated left ventricular end-diastolic  pressure.   2. Right ventricular systolic function is normal. The right ventricular  size is normal. Tricuspid regurgitation signal is inadequate for assessing  PA pressure.   3. The mitral valve is degenerative. Mild mitral valve regurgitation. No  evidence of mitral stenosis. Moderate mitral annular calcification.   4. The aortic valve is calcified. There is moderate calcification of the  aortic valve. There is moderate thickening of the aortic valve. Aortic  valve regurgitation is mild to moderate. Moderate to severe aortic valve  stenosis. Aortic regurgitation PHT  measures 438 msec. Aortic valve area, by VTI measures 0.69 cm. Aortic  valve mean gradient measures 36.0 mmHg. Aortic valve Vmax measures 3.58  m/s.   5. There is mild dilatation of the ascending aorta, measuring 38 mm.   6. The inferior vena cava is normal in size with greater than 50%  respiratory variability, suggesting right atrial pressure of 3 mmHg.   7. Compared to echo dated 02/22/2022, mean AVG has increased from 21 to  , Vmax increased from 2.88 to 3.79m/s and DI has decreased from 0.34  to 0.20.     EKG: V paced rhythm  CT Scan: IMPRESSION: 1. No TAVR vascular access issues. 2. Visceral stenoses are present which are detailed above. 3. Unchanged appearance of suspected right liver cyst, adrenal nodules, and bilateral renal cysts.  IMPRESSION: 1. No aortic aneurysm or aortic dissection. 2. Similar appearance of left mediastinal mass measuring approximately 6.0  cm. 3. Modestly increased right hilar lesion measuring 2.8 cm. I have independently reviewed the above radiologic studies and discussed with the patient  Recent Lab Findings: Lab Results  Component Value Date   WBC 6.8 11/15/2023   HGB 11.6 (L) 11/28/2023   HCT 34.0 (L) 11/28/2023   PLT 272 11/15/2023   GLUCOSE 90 11/15/2023   CHOL 161 11/15/2023   TRIG 118 11/15/2023   HDL 52 11/15/2023   LDLCALC 88 11/15/2023   ALT 12 11/15/2023   AST 14 11/15/2023   NA 134 (L) 11/28/2023   K 3.4 (L) 11/28/2023   CL 96 11/15/2023   CREATININE 0.94 11/15/2023   BUN 19 11/15/2023   CO2 24 11/15/2023   TSH 1.43 10/12/2022      Assessment / Plan:   89 y.o. female with severe aortic stenosis.  STS score: 5.79.  NYHA III .  The risks and benefits of transfemoral TAVR were discussed in detail.  We also discussed possibility of an emergent sternotomy to address any procedural complications.  Based on our discussion, we collectively decided that an emergent sternotomy would not be indicated.  The patient is *** agreeable to proceed.  Based on my review of her LHC, echo, and CTA, I agree with the multidisciplinary plan to proceed with a transfemoral 26mm SapienTAVR.      I  spent {CHL ONC TIME VISIT - DTPQU:[id number]} counseling the patient face to face.   [name] 01/04/2024 12:03 PM

## 2024-01-04 NOTE — Telephone Encounter (Signed)
 Patient in today with continued concerns related to the rash that she has on her trunk, back, and arms. She has seen dermatology and her biopsy showed eosinophils consistent with a drug eruption. She stopped the Chlorthalidone  as instructed but the rash has persisted without improvement. Her BP is not well controlled since the Chlorthalidone  was discontinued. Since there has been no improvement, would like to restart Chlorthalidone . Are you in agreement with this? Concern for Eliquis  being the cause of the rash as this is a common side effect. Would you be willing to change her from Eliquis  to a different option to see if that makes a difference?   ___________________________________________ Zada FREDRIK Palin, DNP, APRN, FNP-BC Primary Care and Sports Medicine [facility] [place]

## 2024-01-04 NOTE — Progress Notes (Unsigned)
        Established patient visit  History, exam, impression, and plan:  1. LUQ pain Pleasant 89 year old female presenting today with reports of LUQ pain that is described as a dull nagging ache. Feels similar to pain that she has experienced from her gall bladder. Known history of gall stones but has not had her gall bladder removed. LUQ pain is worse with eating fatty, greasy foods. Occasional nausea but no vomiting. No change in bowel habits. No fever/chills. Exam benign with no reproducible tenderness. No HSM. Bowel sounds present throughout. Unclear etiology. Differentials include but not limited to referred pain from gall bladder, splenic flexure syndrome, spleen etiology, pancreatic inflammation, or constipation. Checking labs as below. Getting CT abd/pelvis for further evaluation.  - CBC with Differential/Platelet - CMP14+EGFR - Lipase - Amylase - CT ABDOMEN PELVIS WO CONTRAST; Future  2. Moderate persistent asthma, unspecified whether complicated Hx of asthma currently well controlled. Refilling Flovent  inhaler per patient request.  - fluticasone  (FLOVENT  HFA) 110 MCG/ACT inhaler; INHALE 2 PUFFS INTO THE LUNGS 2 (TWO) TIMES DAILY. FOR COUGH  Dispense: 12 each; Refill: 3   Procedures performed this visit: None.  Return if symptoms worsen or fail to improve.  __________________________________ Zada FREDRIK Palin, DNP, APRN, FNP-BC Primary Care and Sports Medicine [facility] [place]

## 2024-01-06 ENCOUNTER — Ambulatory Visit: Payer: Self-pay | Admitting: Cardiology

## 2024-01-06 ENCOUNTER — Encounter: Payer: Self-pay | Admitting: Medical-Surgical

## 2024-01-07 NOTE — Telephone Encounter (Addendum)
 Spoke with pt, Aware of dr vertie recommendations. She is going to check with her insurance before changing over to xarelto. She will let me know what she finds out.

## 2024-01-07 NOTE — Addendum Note (Signed)
 Addended by: Morgin Halls W on: 01/07/2024 02:37 PM   Modules accepted: Orders

## 2024-01-09 DIAGNOSIS — M79642 Pain in left hand: Secondary | ICD-10-CM | POA: Diagnosis not present

## 2024-01-09 DIAGNOSIS — M25532 Pain in left wrist: Secondary | ICD-10-CM | POA: Diagnosis not present

## 2024-01-09 DIAGNOSIS — M79645 Pain in left finger(s): Secondary | ICD-10-CM | POA: Diagnosis not present

## 2024-01-10 ENCOUNTER — Other Ambulatory Visit: Payer: Self-pay

## 2024-01-10 DIAGNOSIS — I350 Nonrheumatic aortic (valve) stenosis: Secondary | ICD-10-CM

## 2024-01-10 NOTE — Telephone Encounter (Signed)
 Spoke with pt regarding the suggested medication change from Eliquis  to Xarelto. Pt stated she just picked up a new prescription for Eliquis . Pt is wondering if she could take Xarelto for 30 days and stop Eliquis  to determine if the rash is from Eliquis . Pt stated that if the rash does not go away, then she can return to taking Eliquis  as that means it was not the Eliquis  that caused the reaction. Pt is also wondering how to make the switch to Xarelto. Pt is wondering if she should wait a day between taking Eliquis  and starting Xarelto. Pt was told her questions would be sent to Dr. Pietro and his nurse as well as our pharmacists for suggestions. Pt verbalized understanding. All questions if any were answered.

## 2024-01-10 NOTE — Telephone Encounter (Signed)
 Pt calling back to speak about med switch

## 2024-01-10 NOTE — Telephone Encounter (Signed)
 Patient can start Xarelto in the evening when her next dose of Eliquis  would be. Please make sure she knows it is a once a day medication. Recommend she update us  if rash does or does not go away after discontinuing Eliquis 

## 2024-01-14 ENCOUNTER — Ambulatory Visit (HOSPITAL_COMMUNITY)
Admission: RE | Admit: 2024-01-14 | Discharge: 2024-01-14 | Disposition: A | Discharge status: Home / Self Care | Source: Ambulatory Visit | Attending: Cardiovascular Disease | Admitting: Cardiovascular Disease

## 2024-01-14 ENCOUNTER — Other Ambulatory Visit: Payer: Self-pay

## 2024-01-14 ENCOUNTER — Encounter: Payer: Self-pay | Admitting: Cardiovascular Disease

## 2024-01-14 ENCOUNTER — Encounter (HOSPITAL_COMMUNITY)
Admission: RE | Admit: 2024-01-14 | Discharge: 2024-01-14 | Disposition: A | Discharge status: Home / Self Care | Source: Ambulatory Visit | Attending: Cardiovascular Disease | Admitting: Cardiovascular Disease

## 2024-01-14 DIAGNOSIS — Z79899 Other long term (current) drug therapy: Secondary | ICD-10-CM | POA: Diagnosis not present

## 2024-01-14 DIAGNOSIS — Z8249 Family history of ischemic heart disease and other diseases of the circulatory system: Secondary | ICD-10-CM | POA: Diagnosis not present

## 2024-01-14 DIAGNOSIS — Z01818 Encounter for other preprocedural examination: Secondary | ICD-10-CM | POA: Insufficient documentation

## 2024-01-14 DIAGNOSIS — I10 Essential (primary) hypertension: Secondary | ICD-10-CM | POA: Diagnosis not present

## 2024-01-14 DIAGNOSIS — Z923 Personal history of irradiation: Secondary | ICD-10-CM | POA: Diagnosis not present

## 2024-01-14 DIAGNOSIS — K219 Gastro-esophageal reflux disease without esophagitis: Secondary | ICD-10-CM | POA: Diagnosis not present

## 2024-01-14 DIAGNOSIS — Z808 Family history of malignant neoplasm of other organs or systems: Secondary | ICD-10-CM | POA: Diagnosis not present

## 2024-01-14 DIAGNOSIS — Z7901 Long term (current) use of anticoagulants: Secondary | ICD-10-CM | POA: Diagnosis not present

## 2024-01-14 DIAGNOSIS — I350 Nonrheumatic aortic (valve) stenosis: Secondary | ICD-10-CM | POA: Diagnosis not present

## 2024-01-14 DIAGNOSIS — Z9221 Personal history of antineoplastic chemotherapy: Secondary | ICD-10-CM | POA: Diagnosis not present

## 2024-01-14 DIAGNOSIS — R001 Bradycardia, unspecified: Secondary | ICD-10-CM | POA: Diagnosis not present

## 2024-01-14 DIAGNOSIS — Z7951 Long term (current) use of inhaled steroids: Secondary | ICD-10-CM | POA: Diagnosis not present

## 2024-01-14 DIAGNOSIS — I771 Stricture of artery: Secondary | ICD-10-CM | POA: Diagnosis not present

## 2024-01-14 DIAGNOSIS — Z952 Presence of prosthetic heart valve: Secondary | ICD-10-CM | POA: Diagnosis not present

## 2024-01-14 DIAGNOSIS — E039 Hypothyroidism, unspecified: Secondary | ICD-10-CM | POA: Diagnosis not present

## 2024-01-14 DIAGNOSIS — Z950 Presence of cardiac pacemaker: Secondary | ICD-10-CM | POA: Diagnosis not present

## 2024-01-14 DIAGNOSIS — I480 Paroxysmal atrial fibrillation: Secondary | ICD-10-CM | POA: Diagnosis not present

## 2024-01-14 DIAGNOSIS — Z006 Encounter for examination for normal comparison and control in clinical research program: Secondary | ICD-10-CM | POA: Diagnosis not present

## 2024-01-14 DIAGNOSIS — J45909 Unspecified asthma, uncomplicated: Secondary | ICD-10-CM | POA: Diagnosis not present

## 2024-01-14 DIAGNOSIS — Z823 Family history of stroke: Secondary | ICD-10-CM | POA: Diagnosis not present

## 2024-01-14 DIAGNOSIS — I119 Hypertensive heart disease without heart failure: Secondary | ICD-10-CM | POA: Diagnosis not present

## 2024-01-14 DIAGNOSIS — Z981 Arthrodesis status: Secondary | ICD-10-CM | POA: Diagnosis not present

## 2024-01-14 DIAGNOSIS — J9859 Other diseases of mediastinum, not elsewhere classified: Secondary | ICD-10-CM | POA: Diagnosis not present

## 2024-01-14 DIAGNOSIS — I2510 Atherosclerotic heart disease of native coronary artery without angina pectoris: Secondary | ICD-10-CM | POA: Diagnosis not present

## 2024-01-14 DIAGNOSIS — E785 Hyperlipidemia, unspecified: Secondary | ICD-10-CM | POA: Diagnosis not present

## 2024-01-14 DIAGNOSIS — Z96653 Presence of artificial knee joint, bilateral: Secondary | ICD-10-CM | POA: Diagnosis not present

## 2024-01-14 DIAGNOSIS — I471 Supraventricular tachycardia, unspecified: Secondary | ICD-10-CM | POA: Diagnosis not present

## 2024-01-14 DIAGNOSIS — Z79890 Hormone replacement therapy (postmenopausal): Secondary | ICD-10-CM | POA: Diagnosis not present

## 2024-01-14 DIAGNOSIS — I700 Atherosclerosis of aorta: Secondary | ICD-10-CM | POA: Diagnosis not present

## 2024-01-14 DIAGNOSIS — Z9049 Acquired absence of other specified parts of digestive tract: Secondary | ICD-10-CM | POA: Diagnosis not present

## 2024-01-14 LAB — URINALYSIS, ROUTINE W REFLEX MICROSCOPIC
Bilirubin Urine: NEGATIVE
Glucose, UA: NEGATIVE mg/dL
Hgb urine dipstick: NEGATIVE
Ketones, ur: NEGATIVE mg/dL
Leukocytes,Ua: NEGATIVE
Nitrite: NEGATIVE
Protein, ur: NEGATIVE mg/dL
Specific Gravity, Urine: 1.012 (ref 1.005–1.030)
pH: 5 (ref 5.0–8.0)

## 2024-01-14 LAB — CBC
HCT: 43.3 % (ref 36.0–46.0)
Hemoglobin: 13.9 g/dL (ref 12.0–15.0)
MCH: 29 pg (ref 26.0–34.0)
MCHC: 32.1 g/dL (ref 30.0–36.0)
MCV: 90.4 fL (ref 80.0–100.0)
Platelets: 243 K/uL (ref 150–400)
RBC: 4.79 MIL/uL (ref 3.87–5.11)
RDW: 12.6 % (ref 11.5–15.5)
WBC: 7.2 K/uL (ref 4.0–10.5)
nRBC: 0 % (ref 0.0–0.2)

## 2024-01-14 LAB — COMPREHENSIVE METABOLIC PANEL WITH GFR
ALT: 14 U/L (ref 0–44)
AST: 20 U/L (ref 15–41)
Albumin: 3.9 g/dL (ref 3.5–5.0)
Alkaline Phosphatase: 45 U/L (ref 38–126)
Anion gap: 9 (ref 5–15)
BUN: 19 mg/dL (ref 8–23)
CO2: 29 mmol/L (ref 22–32)
Calcium: 9.6 mg/dL (ref 8.9–10.3)
Chloride: 98 mmol/L (ref 98–111)
Creatinine, Ser: 1.06 mg/dL — ABNORMAL HIGH (ref 0.44–1.00)
GFR, Estimated: 51 mL/min — ABNORMAL LOW (ref >60)
Glucose, Bld: 93 mg/dL (ref 70–99)
Potassium: 3.7 mmol/L (ref 3.5–5.1)
Sodium: 136 mmol/L (ref 135–145)
Total Bilirubin: 1.2 mg/dL (ref 0.0–1.2)
Total Protein: 7.6 g/dL (ref 6.5–8.1)

## 2024-01-14 LAB — TYPE AND SCREEN
ABO/RH(D): A POS
Antibody Screen: NEGATIVE

## 2024-01-14 LAB — SURGICAL PCR SCREEN
MRSA, PCR: POSITIVE — AB
Staphylococcus aureus: POSITIVE — AB

## 2024-01-14 LAB — PROTIME-INR
INR: 1 (ref 0.8–1.2)
Prothrombin Time: 13.3 s (ref 11.4–15.2)

## 2024-01-14 MED ORDER — RIVAROXABAN 20 MG PO TABS: 20.0000 mg | ORAL_TABLET | Freq: Every day | ORAL | 0 refills | Status: DC

## 2024-01-14 MED ORDER — NOREPINEPHRINE 4 MG/250ML-% IV SOLN
0.0000 ug/min | INTRAVENOUS | Status: DC
  Filled 2024-01-14: qty 250

## 2024-01-14 MED ORDER — MAGNESIUM SULFATE 50 % IJ SOLN
40.0000 meq | INTRAMUSCULAR | Status: DC
  Filled 2024-01-14: qty 9.85

## 2024-01-14 MED ORDER — DEXMEDETOMIDINE HCL IN NACL 400 MCG/100ML IV SOLN
0.1000 ug/kg/h | INTRAVENOUS | Status: AC
  Administered 2024-01-15: 1 ug/kg/h via INTRAVENOUS
  Administered 2024-01-15: 80.32 ug via INTRAVENOUS
  Filled 2024-01-14: qty 100

## 2024-01-14 MED ORDER — CEFAZOLIN SODIUM-DEXTROSE 2-4 GM/100ML-% IV SOLN
2.0000 g | INTRAVENOUS | Status: AC
  Administered 2024-01-15: 2 g via INTRAVENOUS
  Filled 2024-01-14 (×2): qty 100

## 2024-01-14 MED ORDER — VANCOMYCIN HCL IN DEXTROSE 1-5 GM/200ML-% IV SOLN
1000.0000 mg | INTRAVENOUS | Status: AC
  Administered 2024-01-15: 1000 mg via INTRAVENOUS
  Filled 2024-01-14 (×2): qty 200

## 2024-01-14 MED ORDER — HEPARIN 30,000 UNITS/1000 ML (OHS) CELLSAVER SOLUTION
Status: DC
  Filled 2024-01-14: qty 1000

## 2024-01-14 MED ORDER — POTASSIUM CHLORIDE 2 MEQ/ML IV SOLN
80.0000 meq | INTRAVENOUS | Status: DC
  Filled 2024-01-14: qty 40

## 2024-01-14 NOTE — Telephone Encounter (Signed)
 Spoke with pt regarding the pharmacists suggestions. Pt aware. Pt waiting to hear back from Dr. Pietro regarding. Her questions about switching to Xarelto . Will forward to Dr. Vertie nurse. Pt verbalized understanding. All questions if any were answered.

## 2024-01-14 NOTE — H&P (Signed)
 [address], [place] [address]             [phone]     [name] [facility] Medical Record #[id number] Date of Birth: 23-May-1935   Referring: Pietro [name] RAMAN, MD Primary Care: Willo Mini, NP Primary Cardiologist:Brian Pietro, MD   Chief Complaint:        Chief Complaint  Patient presents with   Aortic Stenosis            History of Present Illness:     [name] is a 89 y.o. female presents for surgical evaluation of severe aortic stenosis.  She has a history of atrial fibrillation, bradycardia s/p PPM placement, lymphoma, CAD, and HTN.     She tells me today that she has been having dyspnea on exertion, progressive in severity over the last few months. She has had some chest pain and dizziness/near syncope but no syncope. No LE edema.          Past Medical History:  Diagnosis Date   Acquired trigger finger 08/05/2013   Aortic stenosis     Arthritis     Asthma     Atrial fibrillation (HCC)      paroxysmal , addressed  with metoprolol     Callus of foot 09/03/2018   Chronic sinusitis     Complication of anesthesia     Dyspnea      sob on exertion, reports at her pre-op today 09-13-2018 that her cardiologist has planned for her to do a ECHO for evaluation    Family history of adverse reaction to anesthesia      sister is hard to wake up from Anesthesia   Follicular lymphoma (HCC)      received Chemotherapy (2 years ago)  and radiation (last 12/2017)   Hyperlipidemia     Hypertension     Hypothyroidism     Meniere's disease     PONV (postoperative nausea and vomiting)     Primary localized osteoarthritis of left knee 09/24/2018   Primary localized osteoarthritis of right knee 06/18/2018   Reflux     Sebaceous cyst 09/03/2018   Shingles 10/24/2019               Past Surgical History:  Procedure Laterality Date   ABDOMINAL HYSTERECTOMY       BONE MARROW BIOPSY   06/16/2014    at [facility]   CARPAL TUNNEL RELEASE         bilateral   CHOLECYSTECTOMY       KNEE ARTHROSCOPY       LUMBAR FUSION       PACEMAKER IMPLANT N/A 03/30/2020    Procedure: PACEMAKER IMPLANT;  Surgeon: Kelsie Agent, MD;  Location: [facility];  Service: Cardiovascular;  Laterality: N/A;   PARTIAL KNEE ARTHROPLASTY Right 06/18/2018    Procedure: UNICOMPARTMENTAL KNEE;  Surgeon: Josefina Chew, MD;  Location: [facility] ORS;  Service: Orthopedics;  Laterality: Right;   PARTIAL KNEE ARTHROPLASTY Left 09/24/2018    Procedure: UNICOMPARTMENTAL KNEE;  Surgeon: Josefina Chew, MD;  Location: [facility] OR[facility];  Service: Orthopedics;  Laterality: Left;   RIGHT HEART CATH AND CORONARY ANGIOGRAPHY N/A 11/28/2023    Procedure: RIGHT HEART CATH AND CORONARY ANGIOGRAPHY;  Surgeon: Verlin Lonni BIRCH, MD;  Location: [facility] INVASIVE CV [facility];  Service: Cardiovascular;  Laterality: N/A;   thumb surgery              Social History:  Tobacco Use History  Social History       Tobacco Use  Smoking Status Never  Smokeless Tobacco Never      Social History       Substance and Sexual Activity  Alcohol  Use No        Allergies  No Known Allergies               Current Outpatient Medications  Medication Sig Dispense Refill   acyclovir  ointment (ZOVIRAX ) 5 % Apply 1 Application topically daily as needed. 30 g 1   albuterol  (VENTOLIN  HFA) 108 (90 Base) MCG/ACT inhaler Inhale 2 puffs into the lungs every 6 (six) hours as needed for wheezing or shortness of breath. 8 g 0   amLODipine  (NORVASC ) 5 MG tablet TAKE 1 TABLET BY MOUTH DAILY (Patient taking differently: Take 5 mg by mouth at bedtime.) 90 tablet 3   apixaban  (ELIQUIS ) 5 MG TABS tablet TAKE 1 TABLET BY MOUTH TWICE  DAILY 180 tablet 1   baclofen  (LIORESAL ) 10 MG tablet TAKE 1 TABLET BY MOUTH 3  TIMES DAILY AS NEEDED FOR  MUSCLE SPASMS 270 tablet 0   clobetasol  (TEMOVATE ) 0.05 % external solution Apply 1 Application topically daily as needed (Psoriasis).       Clobetasol  Propionate 0.05 %  shampoo Apply 1 application topically daily. As needed for scalp irritation from psoriasis (Patient taking differently: Apply 1 application  topically daily as needed (As needed for scalp irritation from psoriasis).) 236 mL 3   diphenhydrAMINE  (BENADRYL ) 25 mg capsule Take 25 mg by mouth every 6 (six) hours as needed for itching.       fluticasone -salmeterol (ADVAIR) 250-50 MCG/ACT AEPB Inhale 1 puff into the lungs daily as needed (Shortness of breath).       gabapentin  (NEURONTIN ) 100 MG capsule TAKE 1 CAPSULE BY MOUTH TWICE  DAILY AS NEEDED 180 capsule 1   gentamicin  ointment (GARAMYCIN ) 0.1 % Apply 1 Application topically 2 (two) times daily. 15 g 0   levothyroxine  (SYNTHROID ) 112 MCG tablet TAKE 1 TABLET BY MOUTH DAILY  BEFORE BREAKFAST 90 tablet 3   meclizine  (ANTIVERT ) 25 MG tablet Take 1 tablet (25 mg total) by mouth 3 (three) times daily as needed for dizziness. 30 tablet 0   meloxicam (MOBIC) 15 MG tablet Take 15 mg by mouth daily as needed for pain.       metoprolol  tartrate (LOPRESSOR ) 100 MG tablet TAKE 1 TABLET BY MOUTH TWICE  DAILY 180 tablet 3   mometasone  (ELOCON ) 0.1 % cream Apply 1 Application topically daily. To vaginal area as needed for redness/irritation. Can use twice daily for the first week 100 g 0   Multiple Vitamins-Minerals (PRESERVISION AREDS PO) Take 1 tablet by mouth 2 (two) times daily.       omeprazole  (PRILOSEC) 40 MG capsule TAKE 1 CAPSULE BY MOUTH IN THE  MORNING AND MAY REPEAT DOSE IN  THE EVENING AS NEEDED FOR ACID  REFLUX / INDIGESTION 180 capsule 3   ondansetron  (ZOFRAN ) 4 MG tablet Take 1 tablet (4 mg total) by mouth every 8 (eight) hours as needed for nausea or vomiting. 30 tablet 3   pravastatin  (PRAVACHOL ) 80 MG tablet TAKE 1 TABLET BY MOUTH AT  BEDTIME 90 tablet 1   sennosides-docusate sodium  (SENOKOT-S) 8.6-50 MG tablet Take 2 tablets by mouth daily. (Patient taking differently: Take 2 tablets by mouth daily as needed for constipation.) 30 tablet 1    tetrahydrozoline 0.05 % ophthalmic solution Place 2 drops into both  eyes daily as needed (for dry eyes).       traMADol  (ULTRAM ) 50 MG tablet Take 50 mg by mouth daily as needed.       triamcinolone  (NASACORT  ALLERGY 24HR) 55 MCG/ACT AERO nasal inhaler Place 2 sprays into the nose daily. (Patient taking differently: Place 2 sprays into the nose daily as needed (Congestion).) 3 each 3   valsartan  (DIOVAN ) 80 MG tablet TAKE 1 TABLET BY MOUTH DAILY (Patient taking differently: Take 80 mg by mouth at bedtime.) 90 tablet 2      No current facility-administered medications for this visit.         (Not in a hospital admission)              Family History  Problem Relation Age of Onset   Skin cancer Mother     Stroke Mother     Prostate cancer Father     High blood pressure Father     Heart disease Father     Heart disease Other              Review of Systems:    Review of Systems  Constitutional:  Positive for malaise/fatigue.  Respiratory:  Positive for shortness of breath.   Cardiovascular:  Positive for chest pain.  Neurological: Negative.                    Physical Exam: BP (!) 157/82 (BP Location: Left Arm, Patient Position: Sitting, Cuff Size: Normal)   Pulse 81   Resp 20   Ht 5' 5 (1.651 m)   Wt 177 lb 12.8 oz (80.6 kg)   SpO2 96% Comment: RA  BMI 29.59 kg/m  Physical Exam Constitutional:      General: She is not in acute distress.    Appearance: She is not ill-appearing.  HENT:     Head: Normocephalic and atraumatic.    Eyes:     Extraocular Movements: Extraocular movements intact.      Cardiovascular:     Rate and Rhythm: Normal rate.  Pulmonary:     Effort: Pulmonary effort is normal. No respiratory distress.  Abdominal:     General: Abdomen is flat. There is no distension.    Musculoskeletal:     Cervical back: Normal range of motion.    Skin:    General: Skin is warm.    Neurological:     General: No focal deficit present.     Mental  Status: She is alert and oriented to person, place, and time.          Diagnostic Studies & Laboratory data:    Left Heart Catherization:  Intervention Echo: IMPRESSIONS     1. Left ventricular ejection fraction, by estimation, is 60 to 65%. The  left ventricle has normal function. The left ventricle has no regional  wall motion abnormalities. Left ventricular diastolic parameters are  consistent with Grade I diastolic  dysfunction (impaired relaxation). Elevated left ventricular end-diastolic  pressure.   2. Right ventricular systolic function is normal. The right ventricular  size is normal. Tricuspid regurgitation signal is inadequate for assessing  PA pressure.   3. The mitral valve is degenerative. Mild mitral valve regurgitation. No  evidence of mitral stenosis. Moderate mitral annular calcification.   4. The aortic valve is calcified. There is moderate calcification of the  aortic valve. There is moderate thickening of the aortic valve. Aortic  valve regurgitation is mild to moderate. Moderate to severe aortic valve  stenosis. Aortic regurgitation PHT  measures 438 msec. Aortic valve area, by VTI measures 0.69 cm. Aortic  valve mean gradient measures 36.0 mmHg. Aortic valve Vmax measures 3.58  m/s.   5. There is mild dilatation of the ascending aorta, measuring 38 mm.   6. The inferior vena cava is normal in size with greater than 50%  respiratory variability, suggesting right atrial pressure of 3 mmHg.   7. Compared to echo dated 02/22/2022, mean AVG has increased from 21 to  , Vmax increased from 2.88 to 3.26m/s and DI has decreased from 0.34  to 0.20.     EKG: V paced rhythm   CT Scan: IMPRESSION: 1. No TAVR vascular access issues. 2. Visceral stenoses are present which are detailed above. 3. Unchanged appearance of suspected right liver cyst, adrenal nodules, and bilateral renal cysts.   IMPRESSION: 1. No aortic aneurysm or aortic dissection. 2.  Similar appearance of left mediastinal mass measuring approximately 6.0 cm. 3. Modestly increased right hilar lesion measuring 2.8 cm. I have independently reviewed the above radiologic studies and discussed with the patient    Recent Lab Findings: Recent Labs       Lab Results  Component Value Date    WBC 6.8 11/15/2023    HGB 11.6 (L) 11/28/2023    HCT 34.0 (L) 11/28/2023    PLT 272 11/15/2023    GLUCOSE 90 11/15/2023    CHOL 161 11/15/2023    TRIG 118 11/15/2023    HDL 52 11/15/2023    LDLCALC 88 11/15/2023    ALT 12 11/15/2023    AST 14 11/15/2023    NA 134 (L) 11/28/2023    K 3.4 (L) 11/28/2023    CL 96 11/15/2023    CREATININE 0.94 11/15/2023    BUN 19 11/15/2023    CO2 24 11/15/2023    TSH 1.43 10/12/2022            Assessment / Plan:   89 y.o. female with severe aortic stenosis.  STS score: 5.79.  NYHA III .  The risks and benefits of transfemoral TAVR were discussed in detail.  We also discussed possibility of an emergent sternotomy to address any procedural complications.  Based on our discussion, we collectively decided that an emergent sternotomy would not be indicated.  The patient is agreeable to proceed.  Based on my review of her LHC, echo, and CTA, I agree with the multidisciplinary plan to proceed with a transfemoral 26mm SapienTAVR.        Harrell MALVA Rayas

## 2024-01-14 NOTE — Progress Notes (Signed)
 Surgical PCR +MRSA and +MSSA. Tinnie Daring, RN with TAVR team made aware.

## 2024-01-14 NOTE — Progress Notes (Signed)
 Patient signed all consents at PAT lab appointment. CHG soap and instructions were given to patient. CHG surgical prep reviewed with patient and patient's daughter, Josette, and all questions answered.  Patients chart send to anesthesia for review. Pt denies any respiratory illness/infection in the last two months.

## 2024-01-14 NOTE — Telephone Encounter (Signed)
 Spoke with pt, all questions answered. Samples of xarelto  placed at the front desk for pickup.

## 2024-01-14 NOTE — Addendum Note (Signed)
 Addended by: Domnique Vanegas W on: 01/14/2024 11:24 AM   Modules accepted: Orders

## 2024-01-14 NOTE — Progress Notes (Signed)
 PERIOPERATIVE PRESCRIPTION FOR IMPLANTED CARDIAC DEVICE PROGRAMMING  Patient Information: Name:  [name]  DOB:  19-Jan-1935  MRN:  [id number]  Planned Procedure:  Transcatheter Aortic Valve Replacement, Transfemoral Approach  Surgeon:  Dr. Lonni Cash and Dr. Dorise Fellers  Date of Procedure:  01/15/2024  Cautery will be used.  Position during surgery:  Supine  Device Information:  Clinic EP Physician:  Dr. Eulas Furbish  Device Type:  Pacemaker Manufacturer and Phone #:  Medtronic: [phone] Pacemaker Dependent?:  Yes.   Date of Last Device Check:  01/01/2024 Normal Device Function?:  Yes.    Electrophysiologist's Recommendations:  Have magnet available. Provide continuous ECG monitoring when magnet is used or reprogramming is to be performed.  Procedure will likely interfere with device function.  Device should be programmed:  Asynchronous pacing during procedure and returned to normal programming after procedure  Per Device Clinic Standing Orders, [name], RN  8:41 AM 01/14/2024

## 2024-01-15 ENCOUNTER — Inpatient Hospital Stay (HOSPITAL_COMMUNITY): Admitting: Certified Registered Nurse Anesthetist

## 2024-01-15 ENCOUNTER — Encounter (HOSPITAL_COMMUNITY)
Admission: RE | Disposition: A | Discharge status: Home / Self Care | Payer: Self-pay | Source: Home / Self Care | Attending: Cardiovascular Disease

## 2024-01-15 ENCOUNTER — Inpatient Hospital Stay (HOSPITAL_COMMUNITY)
Admission: RE | Admit: 2024-01-15 | Discharge: 2024-01-16 | DRG: 267 | Disposition: A | Discharge status: Home / Self Care | Attending: Cardiovascular Disease | Admitting: Cardiovascular Disease

## 2024-01-15 ENCOUNTER — Encounter (HOSPITAL_COMMUNITY): Payer: Self-pay | Admitting: Cardiovascular Disease

## 2024-01-15 ENCOUNTER — Other Ambulatory Visit: Payer: Self-pay

## 2024-01-15 ENCOUNTER — Inpatient Hospital Stay (HOSPITAL_COMMUNITY)

## 2024-01-15 DIAGNOSIS — Z981 Arthrodesis status: Secondary | ICD-10-CM | POA: Diagnosis not present

## 2024-01-15 DIAGNOSIS — Z8572 Personal history of non-Hodgkin lymphomas: Secondary | ICD-10-CM

## 2024-01-15 DIAGNOSIS — I119 Hypertensive heart disease without heart failure: Secondary | ICD-10-CM | POA: Diagnosis not present

## 2024-01-15 DIAGNOSIS — I10 Essential (primary) hypertension: Secondary | ICD-10-CM | POA: Diagnosis present

## 2024-01-15 DIAGNOSIS — E039 Hypothyroidism, unspecified: Secondary | ICD-10-CM | POA: Diagnosis not present

## 2024-01-15 DIAGNOSIS — Z950 Presence of cardiac pacemaker: Secondary | ICD-10-CM | POA: Diagnosis present

## 2024-01-15 DIAGNOSIS — I2510 Atherosclerotic heart disease of native coronary artery without angina pectoris: Secondary | ICD-10-CM | POA: Diagnosis present

## 2024-01-15 DIAGNOSIS — Z808 Family history of malignant neoplasm of other organs or systems: Secondary | ICD-10-CM

## 2024-01-15 DIAGNOSIS — Z79890 Hormone replacement therapy (postmenopausal): Secondary | ICD-10-CM | POA: Diagnosis not present

## 2024-01-15 DIAGNOSIS — K219 Gastro-esophageal reflux disease without esophagitis: Secondary | ICD-10-CM | POA: Diagnosis not present

## 2024-01-15 DIAGNOSIS — Z8042 Family history of malignant neoplasm of prostate: Secondary | ICD-10-CM

## 2024-01-15 DIAGNOSIS — Z9049 Acquired absence of other specified parts of digestive tract: Secondary | ICD-10-CM | POA: Diagnosis not present

## 2024-01-15 DIAGNOSIS — I350 Nonrheumatic aortic (valve) stenosis: Principal | ICD-10-CM

## 2024-01-15 DIAGNOSIS — Z7951 Long term (current) use of inhaled steroids: Secondary | ICD-10-CM | POA: Diagnosis not present

## 2024-01-15 DIAGNOSIS — Z823 Family history of stroke: Secondary | ICD-10-CM | POA: Diagnosis not present

## 2024-01-15 DIAGNOSIS — Z006 Encounter for examination for normal comparison and control in clinical research program: Secondary | ICD-10-CM | POA: Diagnosis not present

## 2024-01-15 DIAGNOSIS — Z90710 Acquired absence of both cervix and uterus: Secondary | ICD-10-CM

## 2024-01-15 DIAGNOSIS — Z7901 Long term (current) use of anticoagulants: Secondary | ICD-10-CM

## 2024-01-15 DIAGNOSIS — Z952 Presence of prosthetic heart valve: Principal | ICD-10-CM

## 2024-01-15 DIAGNOSIS — R001 Bradycardia, unspecified: Secondary | ICD-10-CM | POA: Diagnosis not present

## 2024-01-15 DIAGNOSIS — E785 Hyperlipidemia, unspecified: Secondary | ICD-10-CM | POA: Diagnosis not present

## 2024-01-15 DIAGNOSIS — J45909 Unspecified asthma, uncomplicated: Secondary | ICD-10-CM

## 2024-01-15 DIAGNOSIS — Z8249 Family history of ischemic heart disease and other diseases of the circulatory system: Secondary | ICD-10-CM

## 2024-01-15 DIAGNOSIS — C8290 Follicular lymphoma, unspecified, unspecified site: Secondary | ICD-10-CM | POA: Diagnosis present

## 2024-01-15 DIAGNOSIS — Z923 Personal history of irradiation: Secondary | ICD-10-CM | POA: Diagnosis not present

## 2024-01-15 DIAGNOSIS — Z79899 Other long term (current) drug therapy: Secondary | ICD-10-CM

## 2024-01-15 DIAGNOSIS — Z96653 Presence of artificial knee joint, bilateral: Secondary | ICD-10-CM | POA: Diagnosis present

## 2024-01-15 DIAGNOSIS — Z9221 Personal history of antineoplastic chemotherapy: Secondary | ICD-10-CM

## 2024-01-15 DIAGNOSIS — I471 Supraventricular tachycardia, unspecified: Secondary | ICD-10-CM | POA: Diagnosis not present

## 2024-01-15 DIAGNOSIS — I480 Paroxysmal atrial fibrillation: Secondary | ICD-10-CM | POA: Diagnosis not present

## 2024-01-15 DIAGNOSIS — G5603 Carpal tunnel syndrome, bilateral upper limbs: Secondary | ICD-10-CM | POA: Diagnosis present

## 2024-01-15 HISTORY — DX: Presence of cardiac pacemaker: Z95.0

## 2024-01-15 HISTORY — DX: Personal history of other diseases of the digestive system: Z87.19

## 2024-01-15 HISTORY — PX: INTRAOPERATIVE TRANSTHORACIC ECHOCARDIOGRAM: SHX6523

## 2024-01-15 LAB — ECHOCARDIOGRAM LIMITED
AR max vel: 4.67 cm2
AV Area VTI: 4.61 cm2
AV Area mean vel: 4.62 cm2
AV Mean grad: 4 mmHg
AV Peak grad: 7 mmHg
Ao pk vel: 1.32 m/s
Calc EF: 40.4 %
S' Lateral: 4.4 cm
Single Plane A2C EF: 42.3 %
Single Plane A4C EF: 38.7 %

## 2024-01-15 LAB — POCT I-STAT, CHEM 8
BUN: 20 mg/dL (ref 8–23)
Calcium, Ion: 1.26 mmol/L (ref 1.15–1.40)
Chloride: 99 mmol/L (ref 98–111)
Creatinine, Ser: 1 mg/dL (ref 0.44–1.00)
Glucose, Bld: 150 mg/dL — ABNORMAL HIGH (ref 70–99)
HCT: 33 % — ABNORMAL LOW (ref 36.0–46.0)
Hemoglobin: 11.2 g/dL — ABNORMAL LOW (ref 12.0–15.0)
Potassium: 3.7 mmol/L (ref 3.5–5.1)
Sodium: 140 mmol/L (ref 135–145)
TCO2: 26 mmol/L (ref 22–32)

## 2024-01-15 LAB — ABO/RH: ABO/RH(D): A POS

## 2024-01-15 LAB — POCT ACTIVATED CLOTTING TIME: Activated Clotting Time: 250 s

## 2024-01-15 SURGERY — TRANSCATHETER AORTIC VALVE REPLACEMENT, TRANSFEMORAL (CATHLAB)
Anesthesia: Monitor Anesthesia Care

## 2024-01-15 MED ORDER — CHLORHEXIDINE GLUCONATE 0.12 % MT SOLN: 15.0000 mL | Freq: Once | OROMUCOSAL | Status: DC

## 2024-01-15 MED ORDER — SODIUM CHLORIDE 0.9 % IV SOLN: INTRAVENOUS | Status: DC

## 2024-01-15 MED ORDER — BACLOFEN 10 MG PO TABS: 10.0000 mg | ORAL_TABLET | Freq: Three times a day (TID) | ORAL | Status: DC | PRN

## 2024-01-15 MED ORDER — LEVOTHYROXINE SODIUM 112 MCG PO TABS
112.0000 ug | ORAL_TABLET | Freq: Every day | ORAL | Status: DC
  Administered 2024-01-16: 112 ug via ORAL
  Filled 2024-01-15: qty 1

## 2024-01-15 MED ORDER — LIDOCAINE HCL (PF) 1 % IJ SOLN
INTRAMUSCULAR | Status: DC | PRN
Start: 2024-01-15 — End: 2024-01-15
  Administered 2024-01-15 (×2): 10 mL

## 2024-01-15 MED ORDER — ACETAMINOPHEN 325 MG PO TABS
650.0000 mg | ORAL_TABLET | Freq: Four times a day (QID) | ORAL | Status: DC | PRN
  Administered 2024-01-16: 650 mg via ORAL
  Filled 2024-01-15: qty 2

## 2024-01-15 MED ORDER — CHLORHEXIDINE GLUCONATE 4 % EX SOLN: 60.0000 mL | Freq: Once | CUTANEOUS | Status: DC

## 2024-01-15 MED ORDER — SENNOSIDES-DOCUSATE SODIUM 8.6-50 MG PO TABS: 2.0000 | ORAL_TABLET | Freq: Every day | ORAL | Status: DC | PRN

## 2024-01-15 MED ORDER — CHLORHEXIDINE GLUCONATE 4 % EX SOLN
30.0000 mL | CUTANEOUS | Status: DC
  Filled 2024-01-15: qty 30

## 2024-01-15 MED ORDER — IOHEXOL 350 MG/ML SOLN
INTRAVENOUS | Status: DC | PRN
Start: 2024-01-15 — End: 2024-01-15
  Administered 2024-01-15: 45 mL

## 2024-01-15 MED ORDER — SODIUM CHLORIDE 0.9 % IV SOLN: 250.0000 mL | INTRAVENOUS | Status: DC | PRN

## 2024-01-15 MED ORDER — GABAPENTIN 100 MG PO CAPS: 200.0000 mg | ORAL_CAPSULE | Freq: Two times a day (BID) | ORAL | Status: DC | PRN

## 2024-01-15 MED ORDER — CHLORHEXIDINE GLUCONATE 4 % EX SOLN
60.0000 mL | Freq: Once | CUTANEOUS | Status: DC
  Filled 2024-01-15: qty 60

## 2024-01-15 MED ORDER — SODIUM CHLORIDE 0.9% FLUSH: 3.0000 mL | INTRAVENOUS | Status: DC | PRN

## 2024-01-15 MED ORDER — POLYVINYL ALCOHOL 1.4 % OP SOLN
2.0000 [drp] | OPHTHALMIC | Status: DC | PRN
  Administered 2024-01-15: 2 [drp] via OPHTHALMIC
  Filled 2024-01-15: qty 15

## 2024-01-15 MED ORDER — HEPARIN SODIUM (PORCINE) 1000 UNIT/ML IJ SOLN
INTRAMUSCULAR | Status: DC | PRN
  Administered 2024-01-15: 12000 [IU] via INTRAVENOUS

## 2024-01-15 MED ORDER — SODIUM CHLORIDE 0.9% FLUSH
3.0000 mL | Freq: Two times a day (BID) | INTRAVENOUS | Status: DC
  Administered 2024-01-16: 3 mL via INTRAVENOUS

## 2024-01-15 MED ORDER — LACTATED RINGERS IV SOLN: INTRAVENOUS | Status: DC | PRN

## 2024-01-15 MED ORDER — ONDANSETRON HCL 4 MG/2ML IJ SOLN
4.0000 mg | Freq: Four times a day (QID) | INTRAMUSCULAR | Status: DC | PRN
  Administered 2024-01-15: 4 mg via INTRAVENOUS
  Filled 2024-01-15: qty 2

## 2024-01-15 MED ORDER — MORPHINE SULFATE (PF) 2 MG/ML IV SOLN: 1.0000 mg | INTRAVENOUS | Status: DC | PRN

## 2024-01-15 MED ORDER — NOREPINEPHRINE 4 MG/250ML-% IV SOLN: 0.0000 ug/min | INTRAVENOUS | Status: DC

## 2024-01-15 MED ORDER — ACETAMINOPHEN 650 MG RE SUPP
650.0000 mg | Freq: Four times a day (QID) | RECTAL | Status: DC | PRN
  Filled 2024-01-15: qty 1

## 2024-01-15 MED ORDER — PRAVASTATIN SODIUM 40 MG PO TABS
80.0000 mg | ORAL_TABLET | Freq: Every day | ORAL | Status: DC
  Administered 2024-01-15: 80 mg via ORAL
  Filled 2024-01-15: qty 2

## 2024-01-15 MED ORDER — PANTOPRAZOLE SODIUM 40 MG PO TBEC
40.0000 mg | DELAYED_RELEASE_TABLET | Freq: Every day | ORAL | Status: DC
  Administered 2024-01-15 – 2024-01-16 (×2): 40 mg via ORAL
  Filled 2024-01-15 (×2): qty 1

## 2024-01-15 MED ORDER — PROTAMINE SULFATE 10 MG/ML IV SOLN
INTRAVENOUS | Status: DC | PRN
Start: 2024-01-15 — End: 2024-01-15
  Administered 2024-01-15: 120 mg via INTRAVENOUS

## 2024-01-15 MED ORDER — OXYCODONE HCL 5 MG PO TABS: 5.0000 mg | ORAL_TABLET | ORAL | Status: DC | PRN

## 2024-01-15 MED ORDER — CHLORHEXIDINE GLUCONATE 0.12 % MT SOLN
OROMUCOSAL | Status: AC
  Administered 2024-01-15: 15 mL via OROMUCOSAL
  Filled 2024-01-15: qty 15

## 2024-01-15 MED ORDER — HEPARIN (PORCINE) IN NACL 2000-0.9 UNIT/L-% IV SOLN
INTRAVENOUS | Status: DC | PRN
  Administered 2024-01-15: 1000 mL

## 2024-01-15 MED ORDER — CEFAZOLIN SODIUM-DEXTROSE 2-4 GM/100ML-% IV SOLN
2.0000 g | Freq: Three times a day (TID) | INTRAVENOUS | Status: AC
  Administered 2024-01-15 – 2024-01-16 (×2): 2 g via INTRAVENOUS
  Filled 2024-01-15 (×2): qty 100

## 2024-01-15 MED ORDER — CHLORHEXIDINE GLUCONATE 0.12 % MT SOLN: 15.0000 mL | Freq: Once | OROMUCOSAL | Status: AC

## 2024-01-15 MED ORDER — PHENYLEPHRINE 80 MCG/ML (10ML) SYRINGE FOR IV PUSH (FOR BLOOD PRESSURE SUPPORT)
PREFILLED_SYRINGE | INTRAVENOUS | Status: DC | PRN
  Administered 2024-01-15 (×2): 80 ug via INTRAVENOUS

## 2024-01-15 MED ORDER — PROPOFOL 500 MG/50ML IV EMUL
INTRAVENOUS | Status: DC | PRN
  Administered 2024-01-15: 20 ug/kg/min via INTRAVENOUS

## 2024-01-15 MED ORDER — HEPARIN (PORCINE) IN NACL 1000-0.9 UT/500ML-% IV SOLN
INTRAVENOUS | Status: DC | PRN
  Administered 2024-01-15: 500 mL

## 2024-01-15 MED ORDER — SODIUM CHLORIDE 0.9 % IV SOLN: INTRAVENOUS | Status: AC

## 2024-01-15 MED ORDER — NITROGLYCERIN IN D5W 200-5 MCG/ML-% IV SOLN: 0.0000 ug/min | INTRAVENOUS | Status: DC

## 2024-01-15 MED ORDER — TRAMADOL HCL 50 MG PO TABS
50.0000 mg | ORAL_TABLET | ORAL | Status: DC | PRN
  Administered 2024-01-15: 50 mg via ORAL
  Filled 2024-01-15: qty 1

## 2024-01-15 MED ORDER — SODIUM CHLORIDE 0.9 % IV SOLN
INTRAVENOUS | Status: DC | PRN
Start: 2024-01-15 — End: 2024-01-15

## 2024-01-15 MED ORDER — LIDOCAINE HCL (PF) 1 % IJ SOLN
INTRAMUSCULAR | Status: AC
  Filled 2024-01-15: qty 30

## 2024-01-15 SURGICAL SUPPLY — 30 items
BAG SNAP BAND KOVER 36X36 (MISCELLANEOUS) ×2 IMPLANT
CABLE ADAPT PACING TEMP 12FT (ADAPTER) IMPLANT
CATH 26 ULTRA DELIVERY (CATHETERS) IMPLANT
CATH DIAG 6FR PIGTAIL ANGLED (CATHETERS) IMPLANT
CATH INFINITI 5FR ANG PIGTAIL (CATHETERS) IMPLANT
CATH INFINITI 6F AL1 (CATHETERS) IMPLANT
CATH S G BIP PACING (CATHETERS) IMPLANT
CLOSURE MYNX CONTROL 6F/7F (Vascular Products) IMPLANT
CLOSURE PERCLOSE PROSTYLE (VASCULAR PRODUCTS) IMPLANT
CRIMPER (MISCELLANEOUS) IMPLANT
DEVICE INFLATION ATRION QL2530 (MISCELLANEOUS) IMPLANT
ELECT DEFIB PAD ADLT CADENCE (PAD) IMPLANT
KIT MICROPUNCTURE NIT STIFF (SHEATH) IMPLANT
KIT SAPIAN 3 ULTRA RESILIA 26 (Valve) IMPLANT
PACK CARDIAC CATHETERIZATION (CUSTOM PROCEDURE TRAY) ×1 IMPLANT
SET ATX-X65L (MISCELLANEOUS) IMPLANT
SHEATH BRITE TIP 7FR 35CM (SHEATH) IMPLANT
SHEATH INTRODUCER SET 20-26 (SHEATH) IMPLANT
SHEATH PINNACLE 6F 10CM (SHEATH) IMPLANT
SHEATH PINNACLE 8F 10CM (SHEATH) IMPLANT
SHEATH PROBE COVER 6X72 (BAG) IMPLANT
SHIELD CATH-GARD CONTAMINATION (MISCELLANEOUS) IMPLANT
STOPCOCK MORSE 400PSI 3WAY (MISCELLANEOUS) ×2 IMPLANT
TRANSDUCER W/STOPCOCK (MISCELLANEOUS) IMPLANT
TUBING ART PRESS 72 MALE/FEM (TUBING) IMPLANT
WIRE AMPLATZ SS-J .035X180CM (WIRE) IMPLANT
WIRE EMERALD 3MM-J .035X150CM (WIRE) IMPLANT
WIRE EMERALD 3MM-J .035X260CM (WIRE) IMPLANT
WIRE EMERALD ST .035X260CM (WIRE) IMPLANT
WIRE SAFARI SM CURVE 275 (WIRE) IMPLANT

## 2024-01-15 NOTE — Op Note (Signed)
 [facility]   MULTIDISCIPLINARY HEART VALVE TEAM   TAVR OPERATIVE NOTE   Date of Procedure:  01/15/2024  Preoperative Diagnosis: Severe Aortic Stenosis   Postoperative Diagnosis: Same   Procedure:   Transcatheter Aortic Valve Replacement - Percutaneous Right Transfemoral Approach  Edwards Sapien 3 Ultra Resilia THV (size 26 mm, model # 9755RSL, serial # [id number])   Co-Surgeons:  Dorise LOIS Fellers, MD and Lonni Cash, MD   Anesthesiologist:  A. Hodierne, MD  Echocardiographer:  EMERSON Leavens, MD  Pre-operative Echo Findings: Severe aortic stenosis Mild left ventricular systolic dysfunction  Post-operative Echo Findings: No paravalvular leak Mild left ventricular systolic dysfunction   BRIEF CLINICAL NOTE AND INDICATIONS FOR SURGERY  89 y.o. female with severe aortic stenosis. STS score: 5.79. NYHA III . The risks and benefits of transfemoral TAVR were discussed in detail. We also discussed possibility of an emergent sternotomy to address any procedural complications. Based on our discussion, we collectively decided that an emergent sternotomy would not be indicated. The patient is agreeable to proceed. Based on my review of her LHC, echo, and CTA, I agree with the multidisciplinary plan to proceed with a transfemoral 26mm SapienTAVR.     DETAILS OF THE OPERATIVE PROCEDURE  PREPARATION:    The patient was brought to the operating room on the above mentioned date and appropriate monitoring was established by the anesthesia team. The patient was placed in the supine position on the operating table.  Intravenous antibiotics were administered. The patient was monitored closely throughout the procedure under conscious sedation.    Baseline transthoracic echocardiogram was performed. The patient's abdomen and both groins were prepped and draped in a sterile manner. A time out procedure was performed.   PERIPHERAL ACCESS:    Using the modified  Seldinger technique, femoral arterial and venous access was obtained with placement of 6 Fr sheaths on the left side.  A pigtail diagnostic catheter was passed through the left arterial sheath under fluoroscopic guidance into the aortic root.  A temporary transvenous pacemaker catheter was passed through the left femoral venous sheath under fluoroscopic guidance into the right ventricle.  The pacemaker was tested to ensure stable lead placement and pacemaker capture. Aortic root angiography was performed in order to determine the optimal angiographic angle for valve deployment.   TRANSFEMORAL ACCESS:   Percutaneous transfemoral access and sheath placement was performed using ultrasound guidance.  The right common femoral artery was cannulated using a micropuncture needle and appropriate location was verified using hand injection angiogram.  A pair of Abbott Perclose percutaneous closure devices were placed and a 6 French sheath replaced into the femoral artery.  The patient was heparinized systemically and ACT verified > 250 seconds.    A 14 Fr transfemoral E-sheath was introduced into the right common femoral artery after progressively dilating over an Amplatz superstiff wire. An AL-1 catheter was used to direct a straight-tip exchange length wire across the native aortic valve into the left ventricle. This was exchanged out for a pigtail catheter and position was confirmed in the LV apex. Simultaneous LV and Ao pressures were recorded.  The pigtail catheter was exchanged for a Safari wire in the LV apex.   BALLOON AORTIC VALVULOPLASTY:   Not performed   TRANSCATHETER HEART VALVE DEPLOYMENT:   An Edwards Sapien 3 Ultra transcatheter heart valve (size 26 mm) was prepared and crimped per manufacturer's guidelines, and the proper orientation of the valve is confirmed on the [employer] delivery system. The valve was  advanced through the introducer sheath using normal technique until in an  appropriate position in the abdominal aorta beyond the sheath tip. The balloon was then retracted and using the fine-tuning wheel was centered on the valve. The valve was then advanced across the aortic arch using appropriate flexion of the catheter. The valve was carefully positioned across the aortic valve annulus. The Commander catheter was retracted using normal technique. Once final position of the valve has been confirmed by angiographic assessment, the valve is deployed during rapid ventricular pacing to maintain systolic blood pressure < 50 mmHg and pulse pressure < 10 mmHg. The balloon inflation is held for >3 seconds after reaching full deployment volume. Once the balloon has fully deflated the balloon is retracted into the ascending aorta and valve function is assessed using echocardiography. There is felt to be no paravalvular leak and no central aortic insufficiency.  The patient's hemodynamic recovery following valve deployment is good.  The deployment balloon and guidewire are both removed.    PROCEDURE COMPLETION:   The sheath was removed and femoral artery closure performed.  Protamine  was administered once femoral arterial repair was complete. The temporary pacemaker, pigtail catheter and femoral sheaths were removed with manual pressure used for venous hemostasis.  A Mynx femoral closure device was utilized following removal of the diagnostic sheath in the left femoral artery.  The patient tolerated the procedure well and is transported to the cath lab recovery area in stable condition. There were no immediate intraoperative complications. All sponge instrument and needle counts are verified correct at completion of the operation.   No blood products were administered during the operation.  The patient received a total of 45 mL of intravenous contrast during the procedure.   Dorise MARLA Fellers, MD 01/15/2024

## 2024-01-15 NOTE — Discharge Summary (Incomplete)
 HEART AND VASCULAR CENTER   MULTIDISCIPLINARY HEART VALVE TEAM  Discharge Summary    Patient ID: [name] MRN: [id number]; DOB: [birth year]  Admit date: 01/15/2024 Discharge date: 01/16/2024  PCP:  Willo Mini, NP  [facility] Cardiologist:  [name] Shallow, MD  [facility] Structural heart: Lonni Cash, MD [facility] Electrophysiologist:  Will Gladis Norton, MD   Discharge Diagnoses    Principal Problem:   S/P TAVR (transcatheter aortic valve replacement) Active Problems:   Hypothyroidism   Benign essential hypertension   Carpal tunnel syndrome on both sides   Follicular lymphoma (HCC)   GERD (gastroesophageal reflux disease)   Paroxysmal atrial fibrillation (HCC)   Pacemaker   SVT (supraventricular tachycardia) (HCC)   Severe aortic stenosis   Allergies Allergies  Allergen Reactions   Tape Other (See Comments)    Tears skin    Diagnostic Studies/Procedures    HEART AND VASCULAR CENTER  TAVR OPERATIVE NOTE     Date of Procedure:                01/15/2024   Preoperative Diagnosis:      Severe Aortic Stenosis    Postoperative Diagnosis:    Same    Procedure:        Transcatheter Aortic Valve Replacement - Transfemoral Approach             Edwards Sapien 3 THV (size 26 mm, model # [id number] serial # [id number] )              Co-Surgeons:                        Lonni Cash, MD and Dorise Fellers , MD    Anesthesiologist:                  [place]clare   Echocardiographer:              Santo   Pre-operative Echo Findings: Severe aortic stenosis Mild left ventricular systolic dysfunction   Post-operative Echo Findings: No paravalvular leak Mild left ventricular systolic dysfunction _____________    Echo 01/16/24:  1. S/P TAVR with mean gradient 8 mmHg and no AI; LV function improved  compared to previous.   2. Left ventricular ejection fraction, by estimation, is 55 to 60%. The  left ventricle has normal function. The left  ventricle has no regional  wall motion abnormalities. There is mild concentric left ventricular  hypertrophy. Left ventricular diastolic  parameters are consistent with Grade I diastolic dysfunction (impaired  relaxation).   3. Right ventricular systolic function is normal. The right ventricular  size is normal.   4. The mitral valve is normal in structure. Trivial mitral valve  regurgitation. No evidence of mitral stenosis.   5. The aortic valve has been repaired/replaced. Aortic valve  regurgitation is not visualized. No aortic stenosis is present. There is a  26 mm Sapien prosthetic (TAVR) valve present in the aortic position.   6. The inferior vena cava is normal in size with greater than 50%  respiratory variability, suggesting right atrial pressure of 3 mmHg.   History of Present Illness     [name] is a 89 y.o. female with a history of PAF on Eliquis , symptomatic bradycardia s/p PPM, lymphoma, CAD, HTN, HLD, hypothyroidism, GERD and severe aortic valve stenosis who presented to [facility] on 01/15/24 for planned TAVR.   She was admitted in 05/2020 after a syncopal episode and was found to  be bradycardic. A permanent pacemaker was placed. She had atrial fibrillation and SVT noted on her device. She is on Eliquis . Stress myoview  without ischemia in September 2024. Echo April 2025 with LVEF=60-65%. Normal RV function. Mild mitral regurgitation. Severe paradoxical low flow/low gradient aortic stenosis with mean gradient 36 mmHg, AVA 0.69 cm2, SVI 35, DI 0.20. [facility] 11/08/23 showed moderate non-obstructive heavily calcified eccentric stenosis in the proximal LAD, moderate non-obstructive disease in the mid LAD, large dominant Circumflex with no angiographic evidence of disease and a moderate caliber non-dominant RCA with severe ostial stenosis as well as normal right heart pressures   The patient was evaluated by the multidisciplinary valve team and felt to have severe, symptomatic aortic stenosis  and to be a suitable candidate for TAVR, which was set up for 01/15/24.   Hospital Course     Consultants: none   Severe AS:  -- S/p successful TAVR with a 26 mm Edwards Sapien 3 Ultra Resilia THV via the TF approach on 01/15/24.  -- Post operative echo showed EF 55-60%, normally functioning TAVR with a mean gradient of 8 mmHg and no PVL. -- Groin sites are stable.  -- Start Xarelto  20mg  tonight. -- Met with cardiac rehab to discuss CRP phase II.  -- Plan for discharge home today with close follow up in the outpatient setting.   S/p PPM: -- Followed by Dr. Inocencio.  PAF: -- Resume home Lopressor  100mg  BID . -- Pt had a rash on Eliquis  and being trialed on Xarelto . She will pick up samples from the office today and start tonight.   HTN: -- BP elevated in the setting of holding home meds.   -- Resume home valsartan  80mg  daily, chlorthalidone  25mg  daily, Lopressor  100mg  BID and amlodipine  5mg  daily.   CAD:  -- [facility] 11/08/23 showed moderate non-obstructive heavily calcified eccentric stenosis in the proximal LAD, moderate non-obstructive disease in the mid LAD. large dominant Circumflex with no angiographic evidence of disease and a moderate caliber non-dominant RCA with severe ostial stenosis. -- Plan medical therapy.  -- No aspirin  given OAC. -- Continue pravastatin  80mg  daily.   _____________  Discharge Vitals Blood pressure (!) 141/66, pulse 80, temperature 98.2 F (36.8 C), temperature source Oral, resp. rate 19, height (P) 5' 5 (1.651 m), weight 78 kg, SpO2 94%.  Filed Weights   01/15/24 0934 01/16/24 0415  Weight: (P) 77.1 kg 78 kg     GEN: Well nourished, well developed in no acute distress NECK: No JVD CARDIAC: RRR, 1/6 SEM @RUSB . No rubs, gallops RESPIRATORY:  Clear to auscultation without rales, wheezing or rhonchi  ABDOMEN: Soft, non-tender, non-distended EXTREMITIES:  No edema; No deformity.  Groin sites clear without hematoma or ecchymosis. Mild ooze on right  bandage   Disposition   Pt is being discharged home today in good condition.  Follow-up Plans & Appointments     Follow-up Information     Sebastian Lamarr SAUNDERS, PA-C. Go on 01/24/2024.   Specialties: Cardiology, Radiology Why: Please be ready to talk on the phone around 2:30pm. Contact information: [address] [place] [address] [phone]                  Discharge Medications   Allergies as of 01/16/2024       Reactions   Tape Other (See Comments)   Tears skin        Medication List     PAUSE taking these medications    Eliquis  5 MG Tabs tablet Wait  to take this until your doctor or other care provider tells you to start again. Generic drug: apixaban  Take 5 mg by mouth 2 (two) times daily.       TAKE these medications    acyclovir  ointment 5 % Commonly known as: Zovirax  Apply 1 Application topically daily as needed.   albuterol  108 (90 Base) MCG/ACT inhaler Commonly known as: VENTOLIN  HFA Inhale 2 puffs into the lungs every 6 (six) hours as needed for wheezing or shortness of breath.   amLODipine  5 MG tablet Commonly known as: NORVASC  TAKE 1 TABLET BY MOUTH DAILY What changed: when to take this   baclofen  10 MG tablet Commonly known as: LIORESAL  TAKE 1 TABLET BY MOUTH 3  TIMES DAILY AS NEEDED FOR  MUSCLE SPASMS   chlorthalidone  25 MG tablet Commonly known as: HYGROTON  Take 1 tablet (25 mg total) by mouth daily.   clobetasol  0.05 % external solution Commonly known as: TEMOVATE  Apply 1 Application topically daily as needed (Psoriasis). What changed: Another medication with the same name was changed. Make sure you understand how and when to take each.   Clobetasol  Propionate 0.05 % shampoo Apply 1 application topically daily. As needed for scalp irritation from psoriasis What changed:  when to take this reasons to take this additional instructions   diphenhydrAMINE  25 mg capsule Commonly known as: BENADRYL  Take 25 mg  by mouth every 6 (six) hours as needed for itching.   fluticasone -salmeterol 250-50 MCG/ACT Aepb Commonly known as: ADVAIR Inhale 1 puff into the lungs daily as needed (Shortness of breath).   gabapentin  100 MG capsule Commonly known as: NEURONTIN  TAKE 1 CAPSULE BY MOUTH TWICE  DAILY AS NEEDED   gentamicin  ointment 0.1 % Commonly known as: GARAMYCIN  Apply 1 Application topically 2 (two) times daily. What changed:  when to take this reasons to take this   levothyroxine  112 MCG tablet Commonly known as: SYNTHROID  TAKE 1 TABLET BY MOUTH DAILY  BEFORE BREAKFAST   meclizine  25 MG tablet Commonly known as: ANTIVERT  Take 1 tablet (25 mg total) by mouth 3 (three) times daily as needed for dizziness.   meloxicam 15 MG tablet Commonly known as: MOBIC Take 15 mg by mouth daily as needed for pain.   metoprolol  tartrate 100 MG tablet Commonly known as: LOPRESSOR  TAKE 1 TABLET BY MOUTH TWICE  DAILY   mometasone  0.1 % cream Commonly known as: Elocon  Apply 1 Application topically daily. To vaginal area as needed for redness/irritation. Can use twice daily for the first week   omeprazole  40 MG capsule Commonly known as: PRILOSEC TAKE 1 CAPSULE BY MOUTH IN THE  MORNING AND MAY REPEAT DOSE IN  THE EVENING AS NEEDED FOR ACID  REFLUX / INDIGESTION   ondansetron  4 MG tablet Commonly known as: Zofran  Take 1 tablet (4 mg total) by mouth every 8 (eight) hours as needed for nausea or vomiting.   pravastatin  80 MG tablet Commonly known as: PRAVACHOL  TAKE 1 TABLET BY MOUTH AT  BEDTIME   PRESERVISION AREDS PO Take 1 tablet by mouth 2 (two) times daily.   rivaroxaban  20 MG Tabs tablet Commonly known as: XARELTO  Take 1 tablet (20 mg total) by mouth daily with supper.   sennosides-docusate sodium  8.6-50 MG tablet Commonly known as: SENOKOT-S Take 2 tablets by mouth daily. What changed:  when to take this reasons to take this   tetrahydrozoline 0.05 % ophthalmic solution Place 2 drops  into both eyes daily as needed (for dry eyes).   traMADol  50  MG tablet Commonly known as: ULTRAM  Take 50 mg by mouth daily as needed for severe pain (pain score 7-10) or moderate pain (pain score 4-6).   triamcinolone  55 MCG/ACT Aero nasal inhaler Commonly known as: Nasacort  Allergy 24HR Place 2 sprays into the nose daily. What changed:  when to take this reasons to take this   valsartan  80 MG tablet Commonly known as: DIOVAN  TAKE 1 TABLET BY MOUTH DAILY         Outstanding Labs/Studies   none  ______________________  Duration of Discharge Encounter: APP Time: 18 minutes    Signed, Lamarr Hummer, PA-C 01/16/2024, 10:46 AM [phone]  I have personally seen and examined this patient. I agree with the assessment and plan as outlined above. Doing well one day post TAVR with placement of Edwards Sapien 3 Ultra Resilia THV from transfemoral approach. BP stable. Tele with sinus. EKG with sinus, paced.  Labs reviewed My exam: NAD Lungs clear CV:RRR Ext: no LE edema. Groins stable without hematoma.  Plan: Will d/c home today. Follow up one week. Continue Xarelto .   I have spent 25 minutes on chart review, lab review, notes review, tele review, EKG review, examination and plan formulation with not construction.   Lonni Cash, MD, [facility] 01/16/2024 1:19 PM

## 2024-01-15 NOTE — CV Procedure (Signed)
 HEART AND VASCULAR CENTER  TAVR OPERATIVE NOTE   Date of Procedure:  01/15/2024  Preoperative Diagnosis: Severe Aortic Stenosis   Postoperative Diagnosis: Same   Procedure:   Transcatheter Aortic Valve Replacement - Transfemoral Approach  Edwards Sapien 3 THV (size 26 mm, model # [id number] serial # [id number] )   Co-Surgeons:  Lonni Cash, MD and Dorise Fellers , MD   Anesthesiologist:  Hodierne  Echocardiographer:  Santo  Pre-operative Echo Findings: Severe aortic stenosis Mild left ventricular systolic dysfunction  Post-operative Echo Findings: No paravalvular leak Mild left ventricular systolic dysfunction  BRIEF CLINICAL NOTE AND INDICATIONS FOR SURGERY  89 yo female with history of paroxysmal atrial fibrillation, symptomatic bradycardia post pacemaker, lymphoma, HTN, HLD, hypothyroidism, GERD and severe aortic valve stenosis who is here today for TAVR. Echo April 2025 with LVEF=60-65%. Normal RV function. Mild mitral regurgitation. Severe paradoxical low flow/low gradient aortic stenosis with mean gradient 36 mmHg, AVA 0.69 cm2, SVI 35, DI 0.20   During the course of the patient's preoperative work up they have been evaluated comprehensively by a multidisciplinary team of specialists coordinated through the Multidisciplinary Heart Valve Clinic in the [facility].  They have been demonstrated to suffer from symptomatic severe aortic stenosis as noted above. The patient has been counseled extensively as to the relative risks and benefits of all options for the treatment of severe aortic stenosis including long term medical therapy, conventional surgery for aortic valve replacement, and transcatheter aortic valve replacement.  The patient has been independently evaluated by Dr. Fellers with CT surgery and they are felt to be at high risk for conventional surgical aortic valve replacement. The surgeon indicated the patient would be a poor  candidate for conventional surgery. Based upon review of all of the patient's preoperative diagnostic tests they are felt to be candidate for transcatheter aortic valve replacement using the transfemoral approach as an alternative to high risk conventional surgery.    Following the decision to proceed with transcatheter aortic valve replacement, a discussion has been held regarding what types of management strategies would be attempted intraoperatively in the event of life-threatening complications, including whether or not the patient would be considered a candidate for the use of cardiopulmonary bypass and/or conversion to open sternotomy for attempted surgical intervention.  The patient has been advised of a variety of complications that might develop peculiar to this approach including but not limited to risks of death, stroke, paravalvular leak, aortic dissection or other major vascular complications, aortic annulus rupture, device embolization, cardiac rupture or perforation, acute myocardial infarction, arrhythmia, heart block or bradycardia requiring permanent pacemaker placement, congestive heart failure, respiratory failure, renal failure, pneumonia, infection, other late complications related to structural valve deterioration or migration, or other complications that might ultimately cause a temporary or permanent loss of functional independence or other long term morbidity.  The patient provides full informed consent for the procedure as described and all questions were answered preoperatively.    DETAILS OF THE OPERATIVE PROCEDURE  PREPARATION:   The patient is brought to the operating room on the above mentioned date and central monitoring was established by the anesthesia team including placement of a radial arterial line. The patient is placed in the supine position on the operating table.  Intravenous antibiotics are administered. Conscious sedation is used.   Baseline transthoracic  echocardiogram was performed. The patient's chest, abdomen, both groins, and both lower extremities are prepared and draped in a sterile manner. A time out procedure is  performed.   PERIPHERAL ACCESS:   Using the modified Seldinger technique, femoral arterial and venous access were obtained with placement of a 6 Fr sheath in the artery and a 7 Fr sheath in the vein on the left side using u/s guidance.  A pigtail diagnostic catheter was passed through the femoral arterial sheath under fluoroscopic guidance into the aortic root.  A temporary transvenous pacemaker catheter was passed through the femoral venous sheath under fluoroscopic guidance into the right ventricle.  The pacemaker was tested to ensure stable lead placement and pacemaker capture. Aortic root angiography was performed in order to determine the optimal angiographic angle for valve deployment.  TRANSFEMORAL ACCESS:  A micropuncture kit was used to gain access to the right femoral artery using u/s guidance. Position confirmed with angiography. Pre-closure with double ProGlide closure devices. The patient was heparinized systemically and ACT verified > 250 seconds.    A 14 Fr transfemoral E-sheath was introduced into the right femoral artery after progressively dilating over an Amplatz superstiff wire. An AL-2 catheter was used to direct a straight-tip exchange length wire across the native aortic valve into the left ventricle. This was exchanged out for a pigtail catheter and position was confirmed in the LV apex. Simultaneous LV and Ao pressures were recorded.  The pigtail catheter was then exchanged for a Safari wire in the LV apex.   TRANSCATHETER HEART VALVE DEPLOYMENT:  An Edwards Sapien 3 THV (size 26 mm) was prepared and crimped per manufacturer's guidelines, and the proper orientation of the valve is confirmed on the [employer] delivery system. The valve was advanced through the introducer sheath using normal technique until  in an appropriate position in the abdominal aorta beyond the sheath tip. The balloon was then retracted and using the fine-tuning wheel was centered on the valve. The valve was then advanced across the aortic arch using appropriate flexion of the catheter. The valve was carefully positioned across the aortic valve annulus. The Commander catheter was retracted using normal technique. Once final position of the valve has been confirmed by angiographic assessment, the valve is deployed while temporarily holding ventilation and during rapid ventricular pacing to maintain systolic blood pressure < 50 mmHg and pulse pressure < 10 mmHg. The balloon inflation is held for >3 seconds after reaching full deployment volume. Once the balloon has fully deflated the balloon is retracted into the ascending aorta and valve function is assessed using TTE. There is felt to be no paravalvular leak and no central aortic insufficiency.  The patient's hemodynamic recovery following valve deployment is good.  The deployment balloon and guidewire are both removed. Echo demostrated acceptable post-procedural gradients, stable mitral valve function, and no AI.   PROCEDURE COMPLETION:  The sheath was then removed and closure devices were completed. Protamine  was administered once femoral arterial repair was complete. The temporary pacemaker, pigtail catheters and femoral sheaths were removed with a Mynx closure device placed in the artery and manual pressure used for venous hemostasis.    The patient tolerated the procedure well and is transported to the surgical intensive care in stable condition. There were no immediate intraoperative complications. All sponge instrument and needle counts are verified correct at completion of the operation.   No blood products were administered during the operation.  The patient received a total of  45 mL of intravenous contrast during the procedure.  LVEDP: 12 mmHg  Lonni Cash MD,  FACC 01/15/2024 2:02 PM

## 2024-01-15 NOTE — Interval H&P Note (Signed)
 History and Physical Interval Note:  01/15/2024 9:30 AM  [name]  has presented today for surgery, with the diagnosis of Severe Aortic Stenosis.  The various methods of treatment have been discussed with the patient and family. After consideration of risks, benefits and other options for treatment, the patient has consented to  Procedure(s): Transcatheter Aortic Valve Replacement, Transfemoral (N/A) ECHOCARDIOGRAM, TRANSTHORACIC (N/A) as a surgical intervention.  The patient's history has been reviewed, patient examined, no change in status, stable for surgery.  I have reviewed the patient's chart and labs.  Questions were answered to the patient's satisfaction.     Avory Rahimi K Adger Cantera

## 2024-01-15 NOTE — Anesthesia Procedure Notes (Signed)
 Procedure Name: MAC Date/Time: 01/15/2024 12:34 PM  Performed by: Claudene Florina Boga, CRNAPre-anesthesia Checklist: Patient identified, Emergency Drugs available, Suction available and Patient being monitored Patient Re-evaluated:Patient Re-evaluated prior to induction Oxygen  Delivery Method: Simple face mask Dental Injury: Teeth and Oropharynx as per pre-operative assessment

## 2024-01-15 NOTE — Discharge Instructions (Signed)

## 2024-01-15 NOTE — Progress Notes (Signed)
 Patient arrived at the unit from Cath Lab,upon arrival pt is alert and oriented X4,chg bath given,vitals taken,CCMD notified,Bilateral groin level 0,dorsalis pedis pulse palpable ,pt oriented to the unit,call bell in reach

## 2024-01-15 NOTE — Progress Notes (Signed)
  Echocardiogram 2D Echocardiogram has been performed.  [name] 01/15/2024, 1:40 PM

## 2024-01-15 NOTE — Transfer of Care (Signed)
 Immediate Anesthesia Transfer of Care Note  Patient: [name]  Procedure(s) Performed: Transcatheter Aortic Valve Replacement, Transfemoral ECHOCARDIOGRAM, TRANSTHORACIC  Patient Location: Cath Lab  Anesthesia Type:MAC  Level of Consciousness: awake, alert , and oriented  Airway & Oxygen  Therapy: Patient Spontanous Breathing and Patient connected to nasal cannula oxygen   Post-op Assessment: Report given to RN and Post -op Vital signs reviewed and stable  Post vital signs: Reviewed and stable  Last Vitals:  Vitals Value Taken Time  BP 93/53 01/15/24 14:00  Temp    Pulse 66 01/15/24 14:04  Resp 16 01/15/24 14:04  SpO2 93 % 01/15/24 14:04  Vitals shown include unfiled device data.  Last Pain:  Vitals:   01/15/24 1244  TempSrc:   PainSc: 0-No pain         Complications: There were no known notable events for this encounter.

## 2024-01-15 NOTE — Progress Notes (Signed)
  HEART AND VASCULAR CENTER   MULTIDISCIPLINARY HEART VALVE TEAM  Patient doing well s/p TAVR. She is hemodynamically stable. Groin sites stable. ECG with paced rhythm. Transferred from cath lab holding to 4E. Early ambulation after bedrest completed and hopeful discharge over the next 24-48 hours.   Cline Crock PA-C  [facility]  Pager [phone]

## 2024-01-15 NOTE — Anesthesia Preprocedure Evaluation (Signed)
 Anesthesia Evaluation  Patient identified by MRN, date of birth, ID band Patient awake    Reviewed: Allergy & Precautions, H&P , NPO status , Patient's Chart, lab work & pertinent test results  History of Anesthesia Complications (+) PONV and history of anesthetic complications  Airway Mallampati: II   Neck ROM: full    Dental   Pulmonary shortness of breath, asthma    breath sounds clear to auscultation       Cardiovascular hypertension, + dysrhythmias Atrial Fibrillation + pacemaker + Valvular Problems/Murmurs AS  Rhythm:regular Rate:Normal     Neuro/Psych  Neuromuscular disease    GI/Hepatic hiatal hernia,GERD  ,,  Endo/Other  Hypothyroidism    Renal/GU      Musculoskeletal  (+) Arthritis ,    Abdominal   Peds  Hematology   Anesthesia Other Findings   Reproductive/Obstetrics                              Anesthesia Physical Anesthesia Plan  ASA: 3  Anesthesia Plan: MAC   Post-op Pain Management:    Induction: Intravenous  PONV Risk Score and Plan: 3 and Ondansetron , Propofol  infusion and Treatment may vary due to age or medical condition  Airway Management Planned: Simple Face Mask  Additional Equipment:   Intra-op Plan:   Post-operative Plan:   Informed Consent: I have reviewed the patients History and Physical, chart, labs and discussed the procedure including the risks, benefits and alternatives for the proposed anesthesia with the patient or authorized representative who has indicated his/her understanding and acceptance.     Dental advisory given  Plan Discussed with: CRNA, Anesthesiologist and Surgeon  Anesthesia Plan Comments:         Anesthesia Quick Evaluation

## 2024-01-15 NOTE — Progress Notes (Signed)
 Site area: left groin 58F long venous sheath Site Prior to Removal:  Level 0 Pressure Applied For: 20 minutes Manual:   yes Patient Status During Pull:  stable Post Pull Site:  Level 0 Post Pull Instructions Given:  yes Post Pull Pulses Present: left dp palpable Dressing Applied:  gauze and tegaderm Bedrest begins @  Comments:1500

## 2024-01-16 ENCOUNTER — Inpatient Hospital Stay (HOSPITAL_COMMUNITY)

## 2024-01-16 ENCOUNTER — Encounter (HOSPITAL_COMMUNITY): Payer: Self-pay | Admitting: Cardiovascular Disease

## 2024-01-16 DIAGNOSIS — Z952 Presence of prosthetic heart valve: Secondary | ICD-10-CM | POA: Diagnosis not present

## 2024-01-16 DIAGNOSIS — I471 Supraventricular tachycardia, unspecified: Secondary | ICD-10-CM | POA: Diagnosis not present

## 2024-01-16 DIAGNOSIS — I119 Hypertensive heart disease without heart failure: Secondary | ICD-10-CM | POA: Diagnosis not present

## 2024-01-16 DIAGNOSIS — E039 Hypothyroidism, unspecified: Secondary | ICD-10-CM | POA: Diagnosis not present

## 2024-01-16 DIAGNOSIS — I350 Nonrheumatic aortic (valve) stenosis: Secondary | ICD-10-CM | POA: Diagnosis not present

## 2024-01-16 LAB — ECHOCARDIOGRAM COMPLETE
AR max vel: 2.75 cm2
AV Area VTI: 2.63 cm2
AV Area mean vel: 2.93 cm2
AV Mean grad: 8 mmHg
AV Peak grad: 9.9 mmHg
Ao pk vel: 1.58 m/s
Area-P 1/2: 4.83 cm2
Calc EF: 40.4 %
Height: 65 in
S' Lateral: 3.2 cm
Single Plane A2C EF: 34.2 %
Single Plane A4C EF: 47.3 %
Weight: 2752 [oz_av]

## 2024-01-16 LAB — CBC
HCT: 35.9 % — ABNORMAL LOW (ref 36.0–46.0)
Hemoglobin: 12 g/dL (ref 12.0–15.0)
MCH: 28.8 pg (ref 26.0–34.0)
MCHC: 33.4 g/dL (ref 30.0–36.0)
MCV: 86.1 fL (ref 80.0–100.0)
Platelets: 156 K/uL (ref 150–400)
RBC: 4.17 MIL/uL (ref 3.87–5.11)
RDW: 12.5 % (ref 11.5–15.5)
WBC: 7.5 K/uL (ref 4.0–10.5)
nRBC: 0 % (ref 0.0–0.2)

## 2024-01-16 LAB — BASIC METABOLIC PANEL WITH GFR
Anion gap: 7 (ref 5–15)
BUN: 16 mg/dL (ref 8–23)
CO2: 27 mmol/L (ref 22–32)
Calcium: 8.5 mg/dL — ABNORMAL LOW (ref 8.9–10.3)
Chloride: 102 mmol/L (ref 98–111)
Creatinine, Ser: 0.94 mg/dL (ref 0.44–1.00)
GFR, Estimated: 59 mL/min — ABNORMAL LOW (ref >60)
Glucose, Bld: 111 mg/dL — ABNORMAL HIGH (ref 70–99)
Potassium: 3.6 mmol/L (ref 3.5–5.1)
Sodium: 136 mmol/L (ref 135–145)

## 2024-01-16 LAB — MAGNESIUM: Magnesium: 2 mg/dL (ref 1.7–2.4)

## 2024-01-16 NOTE — Progress Notes (Signed)
  Echocardiogram 2D Echocardiogram has been performed.  Tinnie FORBES Gosling RDCS 01/16/2024, 9:01 AM

## 2024-01-16 NOTE — Progress Notes (Signed)
 Discharge instructions (including medications) discussed with and copy provided to patient/caregiver

## 2024-01-16 NOTE — Progress Notes (Signed)
 CARDIAC REHAB PHASE I   Pt and family educated on post TAVR restrictions, HH Nutrition, exercise guidelines, and CRP2 referral to [facility]. Pt very active around home and eager for CRP2. All questions answered, pt in bed with call bell.  [phone]  Con KATHEE Pereyra, MS, ACSM-CEP 01/16/2024 11:23 AM

## 2024-01-16 NOTE — Anesthesia Postprocedure Evaluation (Signed)
 Anesthesia Post Note  Patient: [name]  Procedure(s) Performed: Transcatheter Aortic Valve Replacement, Transfemoral ECHOCARDIOGRAM, TRANSTHORACIC     Patient location during evaluation: Cath Lab Anesthesia Type: MAC Level of consciousness: awake and alert Pain management: pain level controlled Vital Signs Assessment: post-procedure vital signs reviewed and stable Respiratory status: spontaneous breathing, nonlabored ventilation, respiratory function stable and patient connected to nasal cannula oxygen  Cardiovascular status: stable and blood pressure returned to baseline Postop Assessment: no apparent nausea or vomiting Anesthetic complications: no   There were no known notable events for this encounter.  Last Vitals:  Vitals:   01/16/24 0415 01/16/24 0803  BP: 134/70 (!) 141/66  Pulse: 92 80  Resp: (!) 21 19  Temp: 36.6 C 36.8 C  SpO2: 94% 94%    Last Pain:  Vitals:   01/16/24 0905  TempSrc:   PainSc: 0-No pain                 Jalaila Caradonna S

## 2024-01-17 ENCOUNTER — Telehealth: Payer: Self-pay | Admitting: Physician Assistant

## 2024-01-17 ENCOUNTER — Telehealth: Payer: Self-pay

## 2024-01-17 NOTE — Transitions of Care (Post Inpatient/ED Visit) (Signed)
 01/17/2024  Name: [name] MRN: [id number] DOB: [birth year]  Today's TOC FU Call Status: Today's TOC FU Call Status:: Successful TOC FU Call Completed TOC FU Call Complete Date: 01/17/24 Patient's Name and Date of Birth confirmed.  Transition Care Management Follow-up Telephone Call Date of Discharge: 01/16/24 Discharge Facility: [name]) Type of Discharge: Inpatient Admission Primary Inpatient Discharge Diagnosis:: S/P TAVR (transcatheter aortic valve replacement) How have you been since you were released from the hospital?: Better Any questions or concerns?: No  Items Reviewed: Did you receive and understand the discharge instructions provided?: Yes Medications obtained,verified, and reconciled?: Yes (Medications Reviewed) Any new allergies since your discharge?: No Dietary orders reviewed?: Yes Type of Diet Ordered:: Heart healthy - low sodium Do you have support at home?: Yes People in Home [RPT]: child(ren), adult Name of Support/Comfort Primary Source: patient states she lives in home with her daughter & grandson  Medications Reviewed Today: Medications Reviewed Today     Reviewed by Lauro Shona LABOR, RN (Registered Nurse) on 01/17/24 at 0940  Med List Status: <None>   Medication Order Taking? Sig Documenting Provider Last Dose Status Informant  acyclovir  ointment (ZOVIRAX ) 5 % [id number]  Apply 1 Application topically daily as needed.  Patient not taking: Reported on 01/17/2024   Willo Mini, NP  Active Self  albuterol  (VENTOLIN  HFA) 108 (90 Base) MCG/ACT inhaler [id number] Yes Inhale 2 puffs into the lungs every 6 (six) hours as needed for wheezing or shortness of breath. Gladis Elsie BROCKS, PA-C  Active Self  amLODipine  (NORVASC ) 5 MG tablet [id number] Yes TAKE 1 TABLET BY MOUTH DAILY Crenshaw, [name] RAMAN, MD  Active Self  apixaban  (ELIQUIS ) 5 MG TABS tablet [id number]  Take 5 mg by mouth 2 (two) times daily.  Patient not taking: Reported on 01/17/2024   [provider]  Active   baclofen  (LIORESAL ) 10 MG tablet [id number] Yes TAKE 1 TABLET BY MOUTH 3  TIMES DAILY AS NEEDED FOR  MUSCLE SPASMS Marsa Edelman, DO  Active Self  chlorthalidone  (HYGROTON ) 25 MG tablet [id number] Yes Take 1 tablet (25 mg total) by mouth daily. Pietro [name] RAMAN, MD  Active Self  clobetasol  (TEMOVATE ) 0.05 % external solution [id number] Yes Apply 1 Application topically daily as needed (Psoriasis). [provider]  Active Self  Clobetasol  Propionate 0.05 % shampoo [id number]  Apply 1 application topically daily. As needed for scalp irritation from psoriasis  Patient not taking: Reported on 01/17/2024   Alexander, Natalie, DO  Active Self  diphenhydrAMINE  (BENADRYL ) 25 mg capsule [id number] Yes Take 25 mg by mouth every 6 (six) hours as needed for itching. [provider]  Active Self  fluticasone -salmeterol (ADVAIR) 250-50 MCG/ACT AEPB [id number] Yes Inhale 1 puff into the lungs daily as needed (Shortness of breath). [provider]  Active Self  gabapentin  (NEURONTIN ) 100 MG capsule [id number] Yes TAKE 1 CAPSULE BY MOUTH TWICE  DAILY AS NEEDED Willo Mini, NP  Active Self  gentamicin  ointment (GARAMYCIN ) 0.1 % [id number] Yes Apply 1 Application topically 2 (two) times daily. Gershon Donnice SAUNDERS, DPM  Active Self  levothyroxine  (SYNTHROID ) 112 MCG tablet [id number] Yes TAKE 1 TABLET BY MOUTH DAILY  BEFORE BREAKFAST Crenshaw, [name] RAMAN, MD  Active Self  meclizine  (ANTIVERT ) 25 MG tablet [id number] Yes Take 1 tablet (25 mg total) by mouth 3 (three) times daily as needed for dizziness. Alexander, Natalie, DO  Active Self  meloxicam (MOBIC) 15 MG tablet [id number]  Take 15 mg by mouth daily as needed for  pain.  Patient not taking: Reported on 01/17/2024   [provider]  Active Self  metoprolol  tartrate (LOPRESSOR ) 100 MG tablet [id number] Yes TAKE 1 TABLET BY MOUTH TWICE  DAILY Pietro [name] RAMAN, MD  Active Self  mometasone  (ELOCON ) 0.1 % cream  [id number] Yes Apply 1 Application topically daily. To vaginal area as needed for redness/irritation. Can use twice daily for the first week Willo Mini, NP  Active Self  Multiple Vitamins-Minerals (PRESERVISION AREDS PO) [id number] Yes Take 1 tablet by mouth 2 (two) times daily. [provider]  Active Self  omeprazole  (PRILOSEC) 40 MG capsule [id number] Yes TAKE 1 CAPSULE BY MOUTH IN THE  MORNING AND MAY REPEAT DOSE IN  THE EVENING AS NEEDED FOR ACID  REFLUX / FRANCIS Pietro [name] RAMAN, MD  Active Self           Med Note CHRISTIE ALEXANDER   Mon Jan 14, 2024 10:29 AM) bedtime  ondansetron  (ZOFRAN ) 4 MG tablet [id number] Yes Take 1 tablet (4 mg total) by mouth every 8 (eight) hours as needed for nausea or vomiting. Willo Mini, NP  Active Self  pravastatin  (PRAVACHOL ) 80 MG tablet [id number] Yes TAKE 1 TABLET BY MOUTH AT  BEDTIME Pietro [name] RAMAN, MD  Active Self  rivaroxaban  (XARELTO ) 20 MG TABS tablet [id number] Yes Take 1 tablet (20 mg total) by mouth daily with supper. Pietro [name] RAMAN, MD  Active   sennosides-docusate sodium  (SENOKOT-S) 8.6-50 MG tablet [id number] Yes Take 2 tablets by mouth daily. Josefina Chew, MD  Active Self           Med Note LAURALYN, TIFFANI S   Mon Mar 29, 2020  7:52 AM)    tetrahydrozoline 0.05 % ophthalmic solution [id number] Yes Place 2 drops into both eyes daily as needed (for dry eyes). [provider]  Active Self  traMADol  (ULTRAM ) 50 MG tablet [id number] Yes Take 50 mg by mouth daily as needed for severe pain (pain score 7-10) or moderate pain (pain score 4-6). [provider]  Active Self  triamcinolone  (NASACORT  ALLERGY 24HR) 55 MCG/ACT AERO nasal inhaler [id number] Yes Place 2 sprays into the nose daily. Willo Mini, NP  Active Self  valsartan  (DIOVAN ) 80 MG tablet [id number] Yes TAKE 1 TABLET BY MOUTH DAILY Camnitz, Will Gladis, MD  Active Self            Home Care and Equipment/Supplies: Were Home Health Services Ordered?:  No Any new equipment or medical supplies ordered?: No  Functional Questionnaire: Do you need assistance with bathing/showering or dressing?: No Do you need assistance with meal preparation?: No Do you need assistance with eating?: No Do you need assistance with getting out of bed/getting out of a chair/moving?: No Do you have difficulty managing or taking your medications?: No  Follow up appointments reviewed: PCP Follow-up appointment confirmed?: No (TOC RN offered to schedule and patient states she needs to do this with her daughter who will take her) Specialist Hospital Follow-up appointment confirmed?: Yes Date of Specialist follow-up appointment?: 01/24/24 Follow-Up Specialty Provider:: Sebastian Lamarr SAUNDERS, PA-C Do you need transportation to your follow-up appointment?: No Do you understand care options if your condition(s) worsen?: Yes-patient verbalized understanding  SDOH Interventions Today    Flowsheet Row Most Recent Value  SDOH Interventions   Food Insecurity Interventions Intervention Not Indicated  Housing Interventions Intervention Not Indicated  Transportation Interventions Intervention Not Indicated  Utilities Interventions Intervention Not Indicated   Initial TOC call was completed but patient declined enrollment in  TOC Program - patient prefers to schedule hospital follow up with PCP when daughter is available for scheduling. Provided TOC RN contact information.   Shona Prow RN, CCM [place]  VBCI-Population Health RN Care Manager [phone]

## 2024-01-17 NOTE — Telephone Encounter (Addendum)
  HEART AND VASCULAR CENTER   MULTIDISCIPLINARY HEART VALVE TEAM   Patient contacted regarding discharge from [facility] on 01/16/24  Patient understands to follow up with a structural heart provider virtually on 01/24/24. Patient understands discharge instructions? yes Patient understands medications and regimen? yes Patient understands to bring all medications to this visit? Yes  Lamarr Hummer PA-C  [facility]

## 2024-01-23 ENCOUNTER — Telehealth (HOSPITAL_COMMUNITY): Payer: Self-pay

## 2024-01-23 ENCOUNTER — Other Ambulatory Visit (HOSPITAL_COMMUNITY): Payer: Self-pay

## 2024-01-23 DIAGNOSIS — Z952 Presence of prosthetic heart valve: Secondary | ICD-10-CM

## 2024-01-23 NOTE — Telephone Encounter (Signed)
 Pt insurance is active and benefits verified through Medicare B. Co-pay $0, DED $257/$257 met, out of pocket $0/$0 met, co-insurance 20%. No pre-authorization required. [employer] 01/23/2024 @ 10:56am, REF# [id number].  How many CR sessions are covered? (36 visits for TCR, 72 visits for ICR)72 ICR Is this a lifetime maximum or an annual maximum? Annual Has the member used any of these services to date? No Is there a time limit (weeks/months) on start of program and/or program completion? No  2ndary insurance is active and benefits verified through [employer]. Co-pay $0, DED $0/$0 met, out of pocket $0/$0 met, co-insurance 0%. No pre-authorization required.

## 2024-01-23 NOTE — Telephone Encounter (Signed)
 Attempted to call patient regarding interest in cardiac rehab- no answer, left message to call us  back. Sent MyChart message.  F/u on 7/17.

## 2024-01-24 ENCOUNTER — Telehealth: Payer: Self-pay

## 2024-01-24 ENCOUNTER — Ambulatory Visit: Attending: Physician Assistant | Admitting: Physician Assistant

## 2024-01-24 DIAGNOSIS — Z952 Presence of prosthetic heart valve: Secondary | ICD-10-CM

## 2024-01-24 DIAGNOSIS — I480 Paroxysmal atrial fibrillation: Secondary | ICD-10-CM

## 2024-01-24 DIAGNOSIS — I10 Essential (primary) hypertension: Secondary | ICD-10-CM

## 2024-01-24 DIAGNOSIS — I2510 Atherosclerotic heart disease of native coronary artery without angina pectoris: Secondary | ICD-10-CM

## 2024-01-24 DIAGNOSIS — Z950 Presence of cardiac pacemaker: Secondary | ICD-10-CM

## 2024-01-24 NOTE — Telephone Encounter (Signed)
 I connected with  [name] on 01/24/24 by a video enabled telemedicine application and verified that I am speaking with the correct person using two identifiers.   I discussed the limitations of evaluation and management by telemedicine. The patient expressed understanding and agreed to proceed.

## 2024-01-24 NOTE — Progress Notes (Addendum)
 HEART AND VASCULAR CENTER   MULTIDISCIPLINARY HEART VALVE TEAM  Virtual Visit via Telephone Note   Because of [name]'s co-morbid illnesses, she is at least at moderate risk for complications without adequate follow up.  This format is felt to be most appropriate for this patient at this time.  The patient did not have access to video technology/had technical difficulties with video requiring transitioning to audio format only (telephone).  All issues noted in this document were discussed and addressed.  No physical exam could be performed with this format.  Please refer to the patient's chart for her consent to telehealth for [facility].   Evaluation Performed:  Follow-up visit  Date:  01/24/2024   ID:  Jillian, Greer July 17, 1934, MRN [id number]  Patient Location: Home Provider Location: Office/Clinic  PCP:  Willo Mini, NP  Cardiologist:  [name] Shallow, MD  Electrophysiologist:  Soyla Gladis Norton, MD   Chief Complaint:  [facility] s/p TAVR  History of Present Illness:   [name] is a 89 y.o. female with a hx of PAF on Eliquis , symptomatic bradycardia s/p PPM, lymphoma, CAD, HTN, HLD, hypothyroidism, GERD and severe aortic valve stenosis s/p TAVR (01/15/24) who presents via virtual medicine for follow up.  She was admitted in 05/2020 after a syncopal episode and was found to be bradycardic. A permanent pacemaker was placed. She had atrial fibrillation and SVT noted on her device. She is on Eliquis . Stress myoview  without ischemia in September 2024. Echo April 2025 with LVEF=60-65%. Normal RV function. Mild mitral regurgitation. Severe paradoxical low flow/low gradient aortic stenosis with mean gradient 36 mmHg, AVA 0.69 cm2, SVI 35, DI 0.20. [facility] 11/08/23 showed moderate non-obstructive heavily calcified eccentric stenosis in the proximal LAD, moderate non-obstructive disease in the mid LAD, large dominant Circumflex with no angiographic evidence of disease and a moderate  caliber non-dominant RCA with severe ostial stenosis as well as normal right heart pressures. S/p successful TAVR with a 26 mm Edwards Sapien 3 Ultra Resilia THV via the TF approach on 01/15/24. Post operative echo showed EF 55-60%, normally functioning TAVR with a mean gradient of 8 mmHg and no PVL.   Today the patient presents via telephone for follow up.  No CP or SOB. No LE edema, orthopnea or PND. No dizziness or syncope. No blood in stool or urine. No palpitations. Rash is not getting better.   Past Medical History:  Diagnosis Date   Acquired trigger finger 08/05/2013   Aortic stenosis    Arthritis    Asthma    Atrial fibrillation (HCC)    paroxysmal , addressed  with metoprolol     Callus of foot 09/03/2018   Chronic sinusitis    Complication of anesthesia    Dyspnea    sob on exertion, reports at her pre-op today 09-13-2018 that her cardiologist has planned for her to do a ECHO for evaluation    Family history of adverse reaction to anesthesia    sister is hard to wake up from Anesthesia   Follicular lymphoma (HCC)    received Chemotherapy (2 years ago)  and radiation (last 12/2017)   History of hiatal hernia    Hyperlipidemia    Hypertension    Hypothyroidism    Meniere's disease    PONV (postoperative nausea and vomiting)    Presence of permanent cardiac pacemaker    Primary localized osteoarthritis of left knee 09/24/2018   Primary localized osteoarthritis of right knee 06/18/2018   Reflux  S/P TAVR (transcatheter aortic valve replacement) 01/15/2024   s/p TAVR with a 26 mm Edwards Sapien 3 Ultra Resilia THV via the TF approach by Dr. Verlin and Dr. Lucas.   Sebaceous cyst 09/03/2018   Shingles 10/24/2019   Past Surgical History:  Procedure Laterality Date   ABDOMINAL HYSTERECTOMY     BONE MARROW BIOPSY  06/16/2014   at [facility]   CARPAL TUNNEL RELEASE     bilateral   CATARACT EXTRACTION Bilateral    CHOLECYSTECTOMY     INTRAOPERATIVE TRANSTHORACIC  ECHOCARDIOGRAM N/A 01/15/2024   Procedure: ECHOCARDIOGRAM, TRANSTHORACIC;  Surgeon: Verlin Lonni BIRCH, MD;  Location: [facility] INVASIVE CV LAB;  Service: Cardiovascular;  Laterality: N/A;   KNEE ARTHROSCOPY     LUMBAR FUSION     NASAL SINUS SURGERY     PACEMAKER IMPLANT N/A 03/30/2020   Procedure: PACEMAKER IMPLANT;  Surgeon: Kelsie Agent, MD;  Location: [facility] INVASIVE CV LAB;  Service: Cardiovascular;  Laterality: N/A;   PARTIAL KNEE ARTHROPLASTY Right 06/18/2018   Procedure: UNICOMPARTMENTAL KNEE;  Surgeon: Josefina Chew, MD;  Location: [facility] ORS;  Service: Orthopedics;  Laterality: Right;   PARTIAL KNEE ARTHROPLASTY Left 09/24/2018   Procedure: UNICOMPARTMENTAL KNEE;  Surgeon: Josefina Chew, MD;  Location: [facility] ORS;  Service: Orthopedics;  Laterality: Left;   RIGHT HEART CATH AND CORONARY ANGIOGRAPHY N/A 11/28/2023   Procedure: RIGHT HEART CATH AND CORONARY ANGIOGRAPHY;  Surgeon: Verlin Lonni BIRCH, MD;  Location: [facility] INVASIVE CV LAB;  Service: Cardiovascular;  Laterality: N/A;   thumb surgery       No outpatient medications have been marked as taking for the 01/24/24 encounter (Telephone Visit ) with Sebastian Lamarr SAUNDERS, PA-C.     Allergies:   Tape   Social History   Tobacco Use   Smoking status: Never   Smokeless tobacco: Never  Vaping Use   Vaping status: Never Used  Substance Use Topics   Alcohol  use: No   Drug use: No     Family Hx: The patient's family history includes Heart disease in her father and another family member; High blood pressure in her father; Prostate cancer in her father; Skin cancer in her mother; Stroke in her mother.  ROS:   Please see the history of present illness.    All other systems reviewed and are negative.   Labs/Other Tests and Data Reviewed:     EKG:  No ECG reviewed.  Recent Labs: 01/14/2024: ALT 14 01/16/2024: BUN 16; Creatinine, Ser 0.94; Hemoglobin 12.0; Magnesium  2.0; Platelets 156; Potassium 3.6; Sodium 136   Recent Lipid  Panel Lab Results  Component Value Date/Time   CHOL 161 11/15/2023 10:32 AM   TRIG 118 11/15/2023 10:32 AM   HDL 52 11/15/2023 10:32 AM   CHOLHDL 3.1 11/15/2023 10:32 AM   CHOLHDL 3.3 10/12/2022 03:29 PM   LDLCALC 88 11/15/2023 10:32 AM   LDLCALC 79 10/12/2022 03:29 PM    Wt Readings from Last 3 Encounters:  01/16/24 172 lb (78 kg)  01/04/24 177 lb 1.9 oz (80.3 kg)  01/04/24 177 lb 12.8 oz (80.6 kg)     Objective:    Vital Signs:  There were no vitals taken for this visit.     ASSESSMENT & PLAN:    Severe AS s/p TAVR:  -- Pt doing great s/p TAVR.  -- ECG with no HAVB.  -- Groin sites healing well.  -- SBE discussed. She doesn't want a RX called in now.  -- Continue Xarelto  20mg . -- Cleared to resume  all activities without restriction. -- I will see back for 1 month echo and OV.  S/p PPM: -- Followed by Dr. Inocencio.   PAF: -- Continue Lopressor  100mg  BID . -- Pt had a drug rash on Eliquis  and being trialed on Xarelto .    HTN: -- BP not taken today.   -- Continue valsartan  80mg  daily, chlorthalidone  25mg  daily, Lopressor  100mg  BID and amlodipine  5mg  daily.    CAD:  -- [facility] 11/08/23 showed moderate non-obstructive heavily calcified eccentric stenosis in the proximal LAD, moderate non-obstructive disease in the mid LAD. large dominant Circumflex with no angiographic evidence of disease and a moderate caliber non-dominant RCA with severe ostial stenosis. -- Plan medical therapy.  -- No aspirin  given OAC. -- Continue pravastatin  80mg  daily.      Cardiac Rehabilitation Eligibility Assessment  The patient is ready to start cardiac rehabilitation from a cardiac standpoint.      Medication Adjustments/Labs and Tests Ordered: Current medicines are reviewed at length with the patient today.  Concerns regarding medicines are outlined above.   Tests Ordered: No orders of the defined types were placed in this encounter.   Medication Changes: No orders of the  defined types were placed in this encounter.    Disposition:  Follow up 1 month as scheduled  Signed, Lamarr Hummer, PA-C  01/24/2024 12:05 PM    [facility]

## 2024-01-24 NOTE — Addendum Note (Signed)
 Addended by: IZETTA CONCETTA KIDD on: 01/24/2024 03:08 PM   Modules accepted: Orders

## 2024-01-25 ENCOUNTER — Telehealth: Payer: Self-pay | Admitting: Cardiology

## 2024-01-25 NOTE — Telephone Encounter (Signed)
 Spoke with patient and shared response from Katie: fine to resume Eliquis .  Patient verbalized understanding and expressed appreciation for quick response.  Med list updated.

## 2024-01-25 NOTE — Telephone Encounter (Signed)
 Patient states she has a 90 day supply of Eliquis  5 mg. She will run out of Xarelto  20 mg on Sunday 7/20. She states she still has a rash even on Xarelto .  Informed patient Dr. Pietro is on vacation until 7/21. Will forward message to Izetta Hummer, PA-C to see if she can advise on if OK for patient to go back to Eliquis  the day after she finishes Xarelto .

## 2024-01-25 NOTE — Telephone Encounter (Signed)
 Pt c/o medication issue:  1. Name of Medication: Eliquis   2. How are you currently taking this medication (dosage and times per day)?   3. Are you having a reaction (difficulty breathing--STAT)?   4. What is your medication issue? Patient has questions about getting back on her regular dose Eliquis .  She says she still has the rash.

## 2024-01-28 ENCOUNTER — Telehealth: Payer: Self-pay | Admitting: Cardiology

## 2024-01-28 NOTE — Telephone Encounter (Signed)
 Patient returned RN's call.

## 2024-01-28 NOTE — Telephone Encounter (Signed)
 Pt c/o medication issue:  1. Name of Medication:   apixaban  (ELIQUIS ) 5 MG TABS tablet    2. How are you currently taking this medication (dosage and times per day)? As written   3. Are you having a reaction (difficulty breathing--STAT)? no  4. What is your medication issue? Pt went back on medication yesterday. Pt would like to speak to nurse Barnie

## 2024-01-28 NOTE — Telephone Encounter (Signed)
 Pt says to help figure out what is causing her rash possibly med induced determined by a biopsy.... she went off the Eliquis  and took Xarelto  for 2 weeks.. the rash is still there not better or worse. She is back on the Eliquis  as of yesterday.   She is asking what she can try to hold next.. most of the meds she is taking we went through and she has been on them for a liong time.   Will send to [facility] and Dr Pietro for further review.

## 2024-01-28 NOTE — Telephone Encounter (Signed)
 Left a message for the pt to call back.

## 2024-01-29 NOTE — Telephone Encounter (Signed)
 Left message for pt to call.

## 2024-02-01 DIAGNOSIS — D3131 Benign neoplasm of right choroid: Secondary | ICD-10-CM | POA: Diagnosis not present

## 2024-02-01 DIAGNOSIS — H04123 Dry eye syndrome of bilateral lacrimal glands: Secondary | ICD-10-CM | POA: Diagnosis not present

## 2024-02-01 DIAGNOSIS — Z961 Presence of intraocular lens: Secondary | ICD-10-CM | POA: Diagnosis not present

## 2024-02-01 DIAGNOSIS — H353114 Nonexudative age-related macular degeneration, right eye, advanced atrophic with subfoveal involvement: Secondary | ICD-10-CM | POA: Diagnosis not present

## 2024-02-01 DIAGNOSIS — H353221 Exudative age-related macular degeneration, left eye, with active choroidal neovascularization: Secondary | ICD-10-CM | POA: Diagnosis not present

## 2024-02-04 ENCOUNTER — Telehealth (HOSPITAL_COMMUNITY): Payer: Self-pay

## 2024-02-04 DIAGNOSIS — D3131 Benign neoplasm of right choroid: Secondary | ICD-10-CM | POA: Diagnosis not present

## 2024-02-04 DIAGNOSIS — H35033 Hypertensive retinopathy, bilateral: Secondary | ICD-10-CM | POA: Diagnosis not present

## 2024-02-04 DIAGNOSIS — H43813 Vitreous degeneration, bilateral: Secondary | ICD-10-CM | POA: Diagnosis not present

## 2024-02-04 DIAGNOSIS — H353112 Nonexudative age-related macular degeneration, right eye, intermediate dry stage: Secondary | ICD-10-CM | POA: Diagnosis not present

## 2024-02-04 DIAGNOSIS — H353221 Exudative age-related macular degeneration, left eye, with active choroidal neovascularization: Secondary | ICD-10-CM | POA: Diagnosis not present

## 2024-02-04 NOTE — Telephone Encounter (Signed)
 Patient returned RN Debra's call.

## 2024-02-04 NOTE — Telephone Encounter (Signed)
 Attempted to call patient regarding cardiac rehab- no answer, left message. Sent MyChart message.

## 2024-02-04 NOTE — Telephone Encounter (Signed)
 Spoke with pt, the most recent medication that was started was valsartan . She will hold the valsartan  for 3 days. After 33 hours, the valsartan  is out of the system. She will call and let us  know how she is doing after 3 days.

## 2024-02-04 NOTE — Telephone Encounter (Signed)
 Pietro [name] RAMAN, MD to Richie Adrien ORN, RN   01/28/24  3:02 PM Would ask for dermatology's input concerning which medication may be contributing.  I can then discontinue or hold the medication and add a different one as needed.  Make sure there are no new medications that may have caused this as a new medication would be most likely culprit. [name] Pietro  Patient stated there is nothing else it could be but one of her medication. She stated dermatologist said it was drug related. Patient has been having this rash since last Fall. Patient stated she stopped chlorthalidone  for 3 weeks, then started it back, due to no change in rash.   Found Pathology Report from Dermatology-.  12/10/23 - Diagnosis: Lateral left upper arm: Subacute interface vacuolar lymphocytic dermatitis most consistent with drug eruption.  Patient called her PCP. She stated patient should call cardiology since a lot of her medications (amlodipine , valsartan , metoprolol , pravastatin , omeprazole , synthroid ) are prescribed by him. Will send message to Dr. Pietro and our PharmD for advisement.

## 2024-02-08 ENCOUNTER — Other Ambulatory Visit: Payer: Self-pay | Admitting: Cardiology

## 2024-02-11 ENCOUNTER — Telehealth: Payer: Self-pay | Admitting: Cardiology

## 2024-02-11 NOTE — Telephone Encounter (Signed)
 Spoke with the patient who states that she has been holding her valsartan  since last Monday 7/28. She states that her rash is still present and did not improve. She would like to know what she should do next. Will send to Dr. Pietro for advisement.

## 2024-02-11 NOTE — Telephone Encounter (Signed)
 Pt would like a call back regarding a call she had on 07/28 with nurse.

## 2024-02-12 NOTE — Telephone Encounter (Signed)
 Spoke with pt, she reports there was no change with stopping the valsartan . She would like to hold the amlodipine . She will restart valsartan  and hold amlodipine  until her follow up appointment 02/21/24. She also has a follow up appointment later this month with her medical doctor.

## 2024-02-14 ENCOUNTER — Telehealth (HOSPITAL_COMMUNITY): Payer: Self-pay

## 2024-02-14 NOTE — Telephone Encounter (Signed)
 Called patient regarding cardiac rehab, went over program. Patient is not sure about attending as she lives 30 minutes away and would need her daughter to take her and pick her up. Informed patient we can close her referral and if she decides she wants to attend she can call us  back to reopen it. Patient acknowledged understanding.  Closing referral.

## 2024-02-18 DIAGNOSIS — Z4789 Encounter for other orthopedic aftercare: Secondary | ICD-10-CM | POA: Diagnosis not present

## 2024-02-18 DIAGNOSIS — M6281 Muscle weakness (generalized): Secondary | ICD-10-CM | POA: Diagnosis not present

## 2024-02-18 DIAGNOSIS — M25642 Stiffness of left hand, not elsewhere classified: Secondary | ICD-10-CM | POA: Diagnosis not present

## 2024-02-18 NOTE — Progress Notes (Addendum)
 [facility]   MULTIDISCIPLINARY HEART VALVE CLINIC                                     Cardiology Office Note:    Date:  02/22/2024   ID:  [name], DOB September 07, 1934, MRN [id number]  PCP:  Willo Mini, NP  [facility] Cardiologist:  [name] Shallow, MD  [facility] Structural heart: Lonni Cash, MD [facility] Electrophysiologist:  Will Gladis Norton, MD   Referring MD: Willo Mini, NP   1 month s/p TAVR  History of Present Illness:    [name] is a 89 y.o. female with a hx of  PAF on Eliquis , symptomatic bradycardia s/p PPM, follicular lymphoma, CAD, HTN, HLD, hypothyroidism, GERD and severe aortic valve stenosis s/p TAVR (01/15/24) who presents to clinic for follow up.   She was admitted in 05/2020 after a syncopal episode and was found to be bradycardic. A permanent pacemaker was placed. She had atrial fibrillation and SVT noted on her device. She is on Eliquis . Stress myoview  without ischemia in September 2024. Echo April 2025 with LVEF=60-65%. Normal RV function. Mild mitral regurgitation. Severe paradoxical low flow/low gradient aortic stenosis with mean gradient 36 mmHg, AVA 0.69 cm2, SVI 35, DI 0.20. [facility] 11/08/23 showed moderate non-obstructive heavily calcified eccentric stenosis in the proximal LAD, moderate non-obstructive disease in the mid LAD, large dominant Circumflex with no angiographic evidence of disease and a moderate caliber non-dominant RCA with severe ostial stenosis as well as normal right heart pressures. S/p successful TAVR with a 26 mm Edwards Sapien 3 Ultra Resilia THV via the TF approach on 01/15/24. Post operative echo showed EF 55-60%, normally functioning TAVR with a mean gradient of 8 mmHg and no PVL.   Today the patient presents to the office for follow up. Here with her daughter. She has been having spells where she feels dizzy and might pass out. Had two on Tuesday. She had a spell before her TAVR and her pacer was  checked and nothing was found. Has an itchy rash that was biopsied and felt to be a drug reaction. So dermatologist is going down med list and trying her off certain meds to see if they can find a culprit. She does have some CP and SOB. She has a light pain in her chest that goes from under breast to back. It happens on both sides and not related to exertion. Lasts 2-3 minutes. Associated with arm weakness. She has shortness of breath with most activities like folding laundry. She has not noticed any difference in breathing since TAVR. No LE edema, orthopnea or PND. She has frequent dizziness but no syncope. No blood in stool or urine. No palpitations.    Past Medical History:  Diagnosis Date   Acquired trigger finger 08/05/2013   Aortic stenosis    Arthritis    Asthma    Atrial fibrillation (HCC)    paroxysmal , addressed  with metoprolol     Callus of foot 09/03/2018   Chronic sinusitis    Complication of anesthesia    Dyspnea    sob on exertion, reports at her pre-op today 09-13-2018 that her cardiologist has planned for her to do a ECHO for evaluation    Family history of adverse reaction to anesthesia    sister is hard to wake up from Anesthesia   Follicular lymphoma (HCC)    received  Chemotherapy (2 years ago)  and radiation (last 12/2017)   History of hiatal hernia    Hyperlipidemia    Hypertension    Hypothyroidism    Meniere's disease    PONV (postoperative nausea and vomiting)    Presence of permanent cardiac pacemaker    Primary localized osteoarthritis of left knee 09/24/2018   Primary localized osteoarthritis of right knee 06/18/2018   Reflux    S/P TAVR (transcatheter aortic valve replacement) 01/15/2024   s/p TAVR with a 26 mm Edwards Sapien 3 Ultra Resilia THV via the TF approach by Dr. Verlin and Dr. Lucas.   Sebaceous cyst 09/03/2018   Shingles 10/24/2019     Current Medications: Current Meds  Medication Sig   albuterol  (VENTOLIN  HFA) 108 (90 Base) MCG/ACT  inhaler Inhale 2 puffs into the lungs every 6 (six) hours as needed for wheezing or shortness of breath.   amLODipine  (NORVASC ) 5 MG tablet TAKE 1 TABLET BY MOUTH DAILY   apixaban  (ELIQUIS ) 5 MG TABS tablet Take 5 mg by mouth 2 (two) times daily.   baclofen  (LIORESAL ) 10 MG tablet TAKE 1 TABLET BY MOUTH 3  TIMES DAILY AS NEEDED FOR  MUSCLE SPASMS   chlorthalidone  (HYGROTON ) 25 MG tablet Take 1 tablet (25 mg total) by mouth daily.   clobetasol  (TEMOVATE ) 0.05 % external solution Apply 1 Application topically daily as needed (Psoriasis).   Clobetasol  Propionate 0.05 % shampoo Apply 1 application topically daily. As needed for scalp irritation from psoriasis   diphenhydrAMINE  (BENADRYL ) 25 mg capsule Take 25 mg by mouth every 6 (six) hours as needed for itching.   fluticasone -salmeterol (ADVAIR) 250-50 MCG/ACT AEPB Inhale 1 puff into the lungs daily as needed (Shortness of breath).   gabapentin  (NEURONTIN ) 100 MG capsule TAKE 1 CAPSULE BY MOUTH TWICE  DAILY AS NEEDED   gentamicin  ointment (GARAMYCIN ) 0.1 % Apply 1 Application topically 2 (two) times daily.   levothyroxine  (SYNTHROID ) 112 MCG tablet TAKE 1 TABLET BY MOUTH DAILY  BEFORE BREAKFAST   meclizine  (ANTIVERT ) 25 MG tablet Take 1 tablet (25 mg total) by mouth 3 (three) times daily as needed for dizziness.   metoprolol  tartrate (LOPRESSOR ) 100 MG tablet TAKE 1 TABLET BY MOUTH TWICE  DAILY   mometasone  (ELOCON ) 0.1 % cream Apply 1 Application topically daily. To vaginal area as needed for redness/irritation. Can use twice daily for the first week   Multiple Vitamins-Minerals (PRESERVISION AREDS PO) Take 1 tablet by mouth 2 (two) times daily.   omeprazole  (PRILOSEC) 40 MG capsule TAKE 1 CAPSULE BY MOUTH IN THE  MORNING AND MAY REPEAT DOSE IN  THE EVENING AS NEEDED FOR ACID  REFLUX / INDIGESTION   ondansetron  (ZOFRAN ) 4 MG tablet Take 1 tablet (4 mg total) by mouth every 8 (eight) hours as needed for nausea or vomiting.   sennosides-docusate sodium   (SENOKOT-S) 8.6-50 MG tablet Take 2 tablets by mouth daily.   tetrahydrozoline 0.05 % ophthalmic solution Place 2 drops into both eyes daily as needed (for dry eyes).   traMADol  (ULTRAM ) 50 MG tablet Take 50 mg by mouth daily as needed for severe pain (pain score 7-10) or moderate pain (pain score 4-6).   triamcinolone  (NASACORT  ALLERGY 24HR) 55 MCG/ACT AERO nasal inhaler Place 2 sprays into the nose daily.   valsartan  (DIOVAN ) 80 MG tablet TAKE 1 TABLET BY MOUTH DAILY   [DISCONTINUED] pravastatin  (PRAVACHOL ) 80 MG tablet TAKE 1 TABLET BY MOUTH AT  BEDTIME      ROS:   Please see  the history of present illness.    All other systems reviewed and are negative.  EKGs       Risk Assessment/Calculations:    CHA2DS2-VASc Score = 5   This indicates a 7.2% annual risk of stroke. The patient's score is based upon: CHF History: 1 HTN History: 1 Diabetes History: 0 Stroke History: 0 Vascular Disease History: 0 Age Score: 2 Gender Score: 1          Physical Exam:    VS:  BP (!) 140/64   Pulse 72   Ht 5' 5 (1.651 m)   Wt 176 lb 3.2 oz (79.9 kg)   SpO2 95%   BMI 29.32 kg/m     Wt Readings from Last 3 Encounters:  02/21/24 176 lb 3.2 oz (79.9 kg)  01/16/24 172 lb (78 kg)  01/04/24 177 lb 1.9 oz (80.3 kg)     GEN: Well nourished, well developed in no acute distress NECK: No JVD CARDIAC: RRR, no murmurs, rubs, gallops RESPIRATORY:  Clear to auscultation without rales, wheezing or rhonchi  ABDOMEN: Soft, non-tender, non-distended EXTREMITIES:  No edema; No deformity.     ASSESSMENT:    1. S/P TAVR (transcatheter aortic valve replacement)   2. Pacemaker   3. Paroxysmal atrial fibrillation (HCC)   4. Benign essential hypertension   5. Coronary artery disease involving native coronary artery of native heart without angina pectoris   6. Shortness of breath   7. Dizziness   8. Bilateral carpal tunnel syndrome     PLAN:    In order of problems listed above:  Severe  AS s/p TAVR:  -- Echo today shows EF 50%, mild concentric left ventricular hypertrophy normally functioning TAVR with a mean gradient of 5.5 mm hg and no PVL as well as mild MR. -- NYHA class III symptoms.  -- Continue Eliquis  5mg  daily.  -- SBE previously and she wanted to defer calling in antibiotics until she needed to to go the dentist.  -- I will see back for 1 year office visit with echo for valve clinic follow up.   S/p PPM: -- Followed by Dr. Inocencio.   PAF: -- Contnue Lopressor  100mg  BID and Eliquis  5mg  BID.   HTN: -- BP elevated today in the setting of holding home amlodipine  ( for rash). -- Continue valsartan  80mg  daily, chlorthalidone  25mg  daily, Lopressor  100mg  BID and add back amlodipine  5mg  daily.    CAD:  -- [facility] 11/08/23 showed moderate non-obstructive heavily calcified eccentric stenosis in the proximal LAD, moderate non-obstructive disease in the mid LAD. large dominant Circumflex with no angiographic evidence of disease and a moderate caliber non-dominant RCA with severe ostial stenosis. -- Plan medical therapy.  -- No aspirin  given OAC. -- Currently on pravastatin  80mg  daily, however this is next on her list to see if cause of drug rash and we will hold it in this setting.   SOB/dizziness/carpal tunnel: -- Pt continues to struggle with SOB with small tasks such as doing laundry. She has not noticed any appreciable change in symptoms since her TAVR. Valve is functioning well and she has no s/s volume overload. She wonders if it is related to her coronary disease given severe ostial RCA disease with dyspnea/fatigue as an anginal equivalent. Additionally, she has a history of bilateral carpal tunnel syndrome and spinal stenosis as well as dizziness, which raises concern for amyloid.  Of note, she does have a history of follicular lymphoma. I will review with Dr. Verlin and Dr. Pietro  to see if functional stress testing or amyloid work up might be a  consideration.   ADDENDUM: spoke to Dr. Pietro who felt RCA is nondominant and doubted that is causing dyspnea and fatigue. He did think an amyloid work up would be would be reasonable. Will order PYP, SPEP and UPEP.       Medication Adjustments/Labs and Tests Ordered: Current medicines are reviewed at length with the patient today.  Concerns regarding medicines are outlined above.  Orders Placed This Encounter  Procedures   SPEP   UPEP   PYP Scan   ECHOCARDIOGRAM COMPLETE   No orders of the defined types were placed in this encounter.   Patient Instructions  Medication Instructions:  STOP Pravastatin  for now to rule out Drug rash Restart Amlodipine  5mg  daily *If you need a refill on your cardiac medications before your next appointment, please call your pharmacy*  Lab Work: None needed If you have labs (blood work) drawn today and your tests are completely normal, you will receive your results only by: MyChart Message (if you have MyChart) OR A paper copy in the mail If you have any lab test that is abnormal or we need to change your treatment, we will call you to review the results.  Testing/Procedures: 01/2025 Your physician has requested that you have an echocardiogram. Echocardiography is a painless test that uses sound waves to create images of your heart. It provides your doctor with information about the size and shape of your heart and how well your heart's chambers and valves are working. This procedure takes approximately one hour. There are no restrictions for this procedure. Please do NOT wear cologne, perfume, aftershave, or lotions (deodorant is allowed). Please arrive 15 minutes prior to your appointment time.  Please note: We ask at that you not bring children with you during ultrasound (echo/ vascular) testing. Due to room size and safety concerns, children are not allowed in the ultrasound rooms during exams. Our front office staff cannot provide observation  of children in our lobby area while testing is being conducted. An adult accompanying a patient to their appointment will only be allowed in the ultrasound room at the discretion of the ultrasound technician under special circumstances. We apologize for any inconvenience.   Follow-Up: At [facility], you and your health needs are our priority.  As part of our continuing mission to provide you with exceptional heart care, our providers are all part of one team.  This team includes your primary Cardiologist (physician) and Advanced Practice Providers or APPs (Physician Assistants and Nurse Practitioners) who all work together to provide you with the care you need, when you need it.  Your next appointment:   As scheduled  Provider:   Izetta Hummer, PA-C    We recommend signing up for the patient portal called MyChart.  Sign up information is provided on this After Visit Summary.  MyChart is used to connect with patients for Virtual Visits (Telemedicine).  Patients are able to view lab/test results, encounter notes, upcoming appointments, etc.  Non-urgent messages can be sent to your provider as well.   To learn more about what you can do with MyChart, go to [email or web address].        Signed, Lamarr Hummer, PA-C  02/22/2024 9:06 AM    [facility]

## 2024-02-20 ENCOUNTER — Other Ambulatory Visit (HOSPITAL_COMMUNITY)

## 2024-02-20 ENCOUNTER — Other Ambulatory Visit: Payer: Self-pay

## 2024-02-21 ENCOUNTER — Ambulatory Visit (INDEPENDENT_AMBULATORY_CARE_PROVIDER_SITE_OTHER): Admitting: Physician Assistant

## 2024-02-21 ENCOUNTER — Ambulatory Visit (HOSPITAL_COMMUNITY)
Admission: RE | Admit: 2024-02-21 | Discharge: 2024-02-21 | Disposition: A | Discharge status: Home / Self Care | Source: Ambulatory Visit | Attending: Cardiology | Admitting: Cardiology

## 2024-02-21 VITALS — BP 140/64 | HR 72 | Ht 65.0 in | Wt 176.2 lb

## 2024-02-21 DIAGNOSIS — I2510 Atherosclerotic heart disease of native coronary artery without angina pectoris: Secondary | ICD-10-CM | POA: Insufficient documentation

## 2024-02-21 DIAGNOSIS — R0602 Shortness of breath: Secondary | ICD-10-CM | POA: Insufficient documentation

## 2024-02-21 DIAGNOSIS — Z952 Presence of prosthetic heart valve: Secondary | ICD-10-CM | POA: Insufficient documentation

## 2024-02-21 DIAGNOSIS — I10 Essential (primary) hypertension: Secondary | ICD-10-CM | POA: Insufficient documentation

## 2024-02-21 DIAGNOSIS — G5603 Carpal tunnel syndrome, bilateral upper limbs: Secondary | ICD-10-CM | POA: Insufficient documentation

## 2024-02-21 DIAGNOSIS — R42 Dizziness and giddiness: Secondary | ICD-10-CM | POA: Insufficient documentation

## 2024-02-21 DIAGNOSIS — I480 Paroxysmal atrial fibrillation: Secondary | ICD-10-CM | POA: Insufficient documentation

## 2024-02-21 DIAGNOSIS — Z950 Presence of cardiac pacemaker: Secondary | ICD-10-CM | POA: Insufficient documentation

## 2024-02-21 NOTE — Patient Instructions (Signed)
 Medication Instructions:  STOP Pravastatin  for now to rule out Drug rash Restart Amlodipine  5mg  daily *If you need a refill on your cardiac medications before your next appointment, please call your pharmacy*  Lab Work: None needed If you have labs (blood work) drawn today and your tests are completely normal, you will receive your results only by: MyChart Message (if you have MyChart) OR A paper copy in the mail If you have any lab test that is abnormal or we need to change your treatment, we will call you to review the results.  Testing/Procedures: 01/2025 Your physician has requested that you have an echocardiogram. Echocardiography is a painless test that uses sound waves to create images of your heart. It provides your doctor with information about the size and shape of your heart and how well your heart's chambers and valves are working. This procedure takes approximately one hour. There are no restrictions for this procedure. Please do NOT wear cologne, perfume, aftershave, or lotions (deodorant is allowed). Please arrive 15 minutes prior to your appointment time.  Please note: We ask at that you not bring children with you during ultrasound (echo/ vascular) testing. Due to room size and safety concerns, children are not allowed in the ultrasound rooms during exams. Our front office staff cannot provide observation of children in our lobby area while testing is being conducted. An adult accompanying a patient to their appointment will only be allowed in the ultrasound room at the discretion of the ultrasound technician under special circumstances. We apologize for any inconvenience.   Follow-Up: At [facility], you and your health needs are our priority.  As part of our continuing mission to provide you with exceptional heart care, our providers are all part of one team.  This team includes your primary Cardiologist (physician) and Advanced Practice Providers or APPs (Physician  Assistants and Nurse Practitioners) who all work together to provide you with the care you need, when you need it.  Your next appointment:   As scheduled  Provider:   Izetta Hummer, PA-C    We recommend signing up for the patient portal called MyChart.  Sign up information is provided on this After Visit Summary.  MyChart is used to connect with patients for Virtual Visits (Telemedicine).  Patients are able to view lab/test results, encounter notes, upcoming appointments, etc.  Non-urgent messages can be sent to your provider as well.   To learn more about what you can do with MyChart, go to [email or web address].

## 2024-02-22 ENCOUNTER — Ambulatory Visit: Payer: Self-pay | Admitting: Physician Assistant

## 2024-02-22 ENCOUNTER — Other Ambulatory Visit: Payer: Self-pay | Admitting: Physician Assistant

## 2024-02-22 DIAGNOSIS — G5603 Carpal tunnel syndrome, bilateral upper limbs: Secondary | ICD-10-CM

## 2024-02-22 DIAGNOSIS — I350 Nonrheumatic aortic (valve) stenosis: Secondary | ICD-10-CM

## 2024-02-22 DIAGNOSIS — R42 Dizziness and giddiness: Secondary | ICD-10-CM

## 2024-02-22 LAB — ECHOCARDIOGRAM COMPLETE
AR max vel: 2.63 cm2
AV Area VTI: 2.82 cm2
AV Area mean vel: 2.67 cm2
AV Mean grad: 5.5 mmHg
AV Peak grad: 11.2 mmHg
Ao pk vel: 1.67 m/s
S' Lateral: 3.72 cm

## 2024-02-22 NOTE — Addendum Note (Signed)
 Addended by: SEBASTIAN LAMARR SAUNDERS on: 02/22/2024 09:07 AM   Modules accepted: Orders

## 2024-03-03 DIAGNOSIS — H353231 Exudative age-related macular degeneration, bilateral, with active choroidal neovascularization: Secondary | ICD-10-CM | POA: Diagnosis not present

## 2024-03-03 DIAGNOSIS — D3131 Benign neoplasm of right choroid: Secondary | ICD-10-CM | POA: Diagnosis not present

## 2024-03-03 DIAGNOSIS — H353132 Nonexudative age-related macular degeneration, bilateral, intermediate dry stage: Secondary | ICD-10-CM | POA: Diagnosis not present

## 2024-03-03 DIAGNOSIS — H35033 Hypertensive retinopathy, bilateral: Secondary | ICD-10-CM | POA: Diagnosis not present

## 2024-03-03 DIAGNOSIS — H43813 Vitreous degeneration, bilateral: Secondary | ICD-10-CM | POA: Diagnosis not present

## 2024-03-04 ENCOUNTER — Ambulatory Visit: Payer: Medicare Other | Admitting: Medical-Surgical

## 2024-03-04 ENCOUNTER — Telehealth (HOSPITAL_COMMUNITY): Payer: Self-pay

## 2024-03-04 NOTE — Telephone Encounter (Signed)
 Detailed instructions left on the pt's answering machine. [name] CCT

## 2024-03-04 NOTE — Progress Notes (Signed)
 HPI: Follow-up AS/AI, paroxysmal atrial fibrillation and history of syncope (bradycardia mediated status post pacemaker).  Patient admitted September 2021 with syncopal episode.  Found to be bradycardic and ultimately underwent pacemaker. Carotid Doppler September 2021 showed 1 to 39% bilateral stenosis.  Noted to have AVNRT on previous device check and metoprolol  increased.  Monitor November 2023 showed sinus rhythm with PVCs. Previously found to have increased episodes of atrial fibrillation on device check.  Echo 4/25 showed normal LV function, grade 1 DD, mild MR, moderate to severe AS (mean gradient 36 mmHg, AVA 0.69 cm2, DI 0.20), mild to moderate AI.  Cardiac catheterization May 2025 showed 90% ostial to proximal nondominant right coronary artery, 50% proximal LAD and 60% mid LAD.  CTA June 2025 showed 6 cm left mediastinal mass.  Patient had TAVR January 15, 2024.  Last echocardiogram August 2025 showed normal LV function, mild left ventricular hypertrophy, grade 1 diastolic dysfunction, mild left atrial enlargement, mild mitral regurgitation, status post TAVR with mean gradient 5.5 mmHg and no aortic insufficiency.  PYP scan September 2025 not suggestive of amyloid.  Since last seen, she has some dyspnea on exertion but no orthopnea, PND, pedal edema, exertional chest pain or syncope.  Current Outpatient Medications  Medication Sig Dispense Refill   albuterol  (VENTOLIN  HFA) 108 (90 Base) MCG/ACT inhaler Inhale 2 puffs into the lungs every 6 (six) hours as needed for wheezing or shortness of breath. 8 g 0   amLODipine  (NORVASC ) 5 MG tablet TAKE 1 TABLET BY MOUTH DAILY 90 tablet 3   apixaban  (ELIQUIS ) 5 MG TABS tablet Take 5 mg by mouth 2 (two) times daily.     baclofen  (LIORESAL ) 10 MG tablet TAKE 1 TABLET BY MOUTH 3  TIMES DAILY AS NEEDED FOR  MUSCLE SPASMS 270 tablet 0   chlorthalidone  (HYGROTON ) 25 MG tablet Take 1 tablet (25 mg total) by mouth daily.     clobetasol  (TEMOVATE ) 0.05 % external  solution Apply 1 Application topically daily as needed (Psoriasis).     Clobetasol  Propionate 0.05 % shampoo Apply 1 application topically daily. As needed for scalp irritation from psoriasis 236 mL 3   diphenhydrAMINE  (BENADRYL ) 25 mg capsule Take 25 mg by mouth every 6 (six) hours as needed for itching.     fluticasone -salmeterol (ADVAIR) 250-50 MCG/ACT AEPB Inhale 1 puff into the lungs daily as needed (Shortness of breath).     gabapentin  (NEURONTIN ) 100 MG capsule TAKE 1 CAPSULE BY MOUTH TWICE  DAILY AS NEEDED 180 capsule 1   gentamicin  ointment (GARAMYCIN ) 0.1 % Apply 1 Application topically 2 (two) times daily. 15 g 0   levothyroxine  (SYNTHROID ) 112 MCG tablet TAKE 1 TABLET BY MOUTH DAILY  BEFORE BREAKFAST 90 tablet 3   meclizine  (ANTIVERT ) 25 MG tablet Take 1 tablet (25 mg total) by mouth 3 (three) times daily as needed for dizziness. 30 tablet 0   metoprolol  tartrate (LOPRESSOR ) 100 MG tablet TAKE 1 TABLET BY MOUTH TWICE  DAILY 180 tablet 3   mometasone  (ELOCON ) 0.1 % cream Apply 1 Application topically daily. To vaginal area as needed for redness/irritation. Can use twice daily for the first week 100 g 0   Multiple Vitamins-Minerals (PRESERVISION AREDS PO) Take 1 tablet by mouth 2 (two) times daily.     omeprazole  (PRILOSEC) 40 MG capsule TAKE 1 CAPSULE BY MOUTH IN THE  MORNING AND MAY REPEAT DOSE IN  THE EVENING AS NEEDED FOR ACID  REFLUX / INDIGESTION 180 capsule 3  ondansetron  (ZOFRAN ) 4 MG tablet Take 1 tablet (4 mg total) by mouth every 8 (eight) hours as needed for nausea or vomiting. 30 tablet 3   sennosides-docusate sodium  (SENOKOT-S) 8.6-50 MG tablet Take 2 tablets by mouth daily. 30 tablet 1   tetrahydrozoline 0.05 % ophthalmic solution Place 2 drops into both eyes daily as needed (for dry eyes).     traMADol  (ULTRAM ) 50 MG tablet Take 50 mg by mouth daily as needed for severe pain (pain score 7-10) or moderate pain (pain score 4-6).     triamcinolone  (NASACORT  ALLERGY 24HR) 55  MCG/ACT AERO nasal inhaler Place 2 sprays into the nose daily. 3 each 3   valsartan  (DIOVAN ) 80 MG tablet TAKE 1 TABLET BY MOUTH DAILY 90 tablet 2   No current facility-administered medications for this visit.     Past Medical History:  Diagnosis Date   Acquired trigger finger 08/05/2013   Arthritis    Asthma    Atrial fibrillation (HCC)    paroxysmal , addressed  with metoprolol     Callus of foot 09/03/2018   Chronic sinusitis    Complication of anesthesia    Follicular lymphoma (HCC)    received Chemotherapy (2 years ago)  and radiation (last 12/2017)   Hyperlipidemia    Hypertension    Hypothyroidism    Meniere's disease    PONV (postoperative nausea and vomiting)    Presence of permanent cardiac pacemaker    Primary localized osteoarthritis of left knee 09/24/2018   Primary localized osteoarthritis of right knee 06/18/2018   Reflux    S/P FESS (functional endoscopic sinus surgery) 01/04/2023   S/P TAVR (transcatheter aortic valve replacement) 01/15/2024   s/p TAVR with a 26 mm Edwards Sapien 3 Ultra Resilia THV via the TF approach by Dr. Verlin and Dr. Lucas.   Sebaceous cyst 09/03/2018   Severe aortic stenosis    Shingles 10/24/2019    Past Surgical History:  Procedure Laterality Date   ABDOMINAL HYSTERECTOMY     BONE MARROW BIOPSY  06/16/2014   at [facility]   CARPAL TUNNEL RELEASE     bilateral   CATARACT EXTRACTION Bilateral    CHOLECYSTECTOMY     INTRAOPERATIVE TRANSTHORACIC ECHOCARDIOGRAM N/A 01/15/2024   Procedure: ECHOCARDIOGRAM, TRANSTHORACIC;  Surgeon: Verlin Lonni BIRCH, MD;  Location: [facility];  Service: Cardiovascular;  Laterality: N/A;   KNEE ARTHROSCOPY     LUMBAR FUSION     NASAL SINUS SURGERY     PACEMAKER IMPLANT N/A 03/30/2020   Procedure: PACEMAKER IMPLANT;  Surgeon: Kelsie Agent, MD;  Location: [facility];  Service: Cardiovascular;  Laterality: N/A;   PARTIAL KNEE ARTHROPLASTY Right 06/18/2018   Procedure:  UNICOMPARTMENTAL KNEE;  Surgeon: Josefina Chew, MD;  Location: [facility];  Service: Orthopedics;  Laterality: Right;   PARTIAL KNEE ARTHROPLASTY Left 09/24/2018   Procedure: UNICOMPARTMENTAL KNEE;  Surgeon: Josefina Chew, MD;  Location: [facility];  Service: Orthopedics;  Laterality: Left;   RIGHT HEART CATH AND CORONARY ANGIOGRAPHY N/A 11/28/2023   Procedure: RIGHT HEART CATH AND CORONARY ANGIOGRAPHY;  Surgeon: Verlin Lonni BIRCH, MD;  Location: [facility];  Service: Cardiovascular;  Laterality: N/A;   thumb surgery      Social History   Socioeconomic History   Marital status: Widowed    Spouse name: Not on file   Number of children: 1   Years of education: 10   Highest education level: 12th grade  Occupational History   Occupation: Advertising account planner  Comment: retired  Tobacco Use   Smoking status: Never   Smokeless tobacco: Never  Vaping Use   Vaping status: Never Used  Substance and Sexual Activity   Alcohol  use: No   Drug use: No   Sexual activity: Not Currently    Partners: Male  Other Topics Concern   Not on file  Social History Narrative   No exercise. 2 cups of coffee a day    Social Drivers of Corporate investment banker Strain: Low Risk  (01/03/2024)   Overall Financial Resource Strain (CARDIA)    Difficulty of Paying Living Expenses: Not very hard  Food Insecurity: No Food Insecurity (01/17/2024)   Hunger Vital Sign    Worried About Running Out of Food in the Last Year: Never true    Ran Out of Food in the Last Year: Never true  Transportation Needs: No Transportation Needs (01/17/2024)   PRAPARE - Administrator, Civil Service (Medical): No    Lack of Transportation (Non-Medical): No  Physical Activity: Inactive (01/03/2024)   Exercise Vital Sign    Days of Exercise per Week: 0 days    Minutes of Exercise per Session: Not on file  Stress: No Stress Concern Present (01/03/2024)   [employer] of Occupational Health - Occupational Stress  Questionnaire    Feeling of Stress: Only a little  Social Connections: Moderately Integrated (01/15/2024)   Social Connection and Isolation Panel    Frequency of Communication with Friends and Family: Three times a week    Frequency of Social Gatherings with Friends and Family: Once a week    Attends Religious Services: More than 4 times per year    Active Member of [employer] or Organizations: Yes    Attends Banker Meetings: More than 4 times per year    Marital Status: Widowed  Intimate Partner Violence: Not At Risk (01/17/2024)   Humiliation, Afraid, Rape, and Kick questionnaire    Fear of Current or Ex-Partner: No    Emotionally Abused: No    Physically Abused: No    Sexually Abused: No    Family History  Problem Relation Age of Onset   Skin cancer Mother    Stroke Mother    Prostate cancer Father    High blood pressure Father    Heart disease Father    Heart disease Other     ROS: no fevers or chills, productive cough, hemoptysis, dysphasia, odynophagia, melena, hematochezia, dysuria, hematuria, rash, seizure activity, orthopnea, PND, pedal edema, claudication. Remaining systems are negative.  Physical Exam: Well-developed well-nourished in no acute distress.  Skin is warm and dry.  HEENT is normal.  Neck is supple.  Chest is clear to auscultation with normal expansion.  Cardiovascular exam is regular rate and rhythm.  Abdominal exam nontender or distended. No masses palpated. Extremities show no edema. neuro grossly intact  EKG Interpretation Date/Time:  Monday March 17 2024 10:12:04 EDT Ventricular Rate:  69 PR Interval:  226 QRS Duration:  122 QT Interval:  432 QTC Calculation: 462 R Axis:   -13  Text Interpretation: Atrial-sensed ventricular-paced rhythm with prolonged AV conduction Confirmed by Pietro Rogue ([id number]) on 03/17/2024 10:22:24 AM     A/P  1 status post TAVR-most recent echocardiogram showed normally functioning valve.  Continue  SBE prophylaxis.  2 persistent dyspnea-etiology unclear.  PYP scan negative.  They are considering pulmonary evaluation.  3 history of paroxysmal atrial fibrillation-continue metoprolol  for rate control of atrial fibrillation recurs.  Continue apixaban .  4 history of AVNRT-continue beta-blocker.  She has had no recurrent symptoms suggestive of SVT.  5 hypertension-patient's blood pressure is controlled.  Continue present medical regimen.  6 history of pacemaker-per electrophysiology.  7 hyperlipidemia-continue statin.  8 history of PVCs-continue beta-blocker.  9 history of follicular lymphoma-follow-up oncology.  10 coronary artery disease-continue statin.  [name] Shallow, MD

## 2024-03-06 ENCOUNTER — Other Ambulatory Visit: Payer: Self-pay | Admitting: Physician Assistant

## 2024-03-06 DIAGNOSIS — G5603 Carpal tunnel syndrome, bilateral upper limbs: Secondary | ICD-10-CM

## 2024-03-06 DIAGNOSIS — R0602 Shortness of breath: Secondary | ICD-10-CM

## 2024-03-06 DIAGNOSIS — R42 Dizziness and giddiness: Secondary | ICD-10-CM

## 2024-03-06 DIAGNOSIS — Z952 Presence of prosthetic heart valve: Secondary | ICD-10-CM

## 2024-03-07 ENCOUNTER — Encounter: Payer: Self-pay | Admitting: Medical-Surgical

## 2024-03-07 ENCOUNTER — Ambulatory Visit: Admitting: Medical-Surgical

## 2024-03-07 VITALS — BP 145/72 | HR 64 | Resp 20 | Ht 65.0 in | Wt 175.0 lb

## 2024-03-07 DIAGNOSIS — I10 Essential (primary) hypertension: Secondary | ICD-10-CM | POA: Diagnosis not present

## 2024-03-07 DIAGNOSIS — L309 Dermatitis, unspecified: Secondary | ICD-10-CM | POA: Diagnosis not present

## 2024-03-07 DIAGNOSIS — Z952 Presence of prosthetic heart valve: Secondary | ICD-10-CM

## 2024-03-07 DIAGNOSIS — R21 Rash and other nonspecific skin eruption: Secondary | ICD-10-CM | POA: Insufficient documentation

## 2024-03-07 NOTE — Progress Notes (Signed)
        Established patient visit   History of Present Illness   Discussed the use of AI scribe software for clinical note transcription with the patient, who gave verbal consent to proceed.  History of Present Illness   [name] is an 89 year old female with suspected cardiac amyloidosis who presents with a persistent rash and fluctuating blood pressure.  Cutaneous eruption - Persistent eczematous rash unresponsive to various medications - Rash resembles tinea corporis in some areas, particularly around the pacemaker site - Distribution includes spreading slightly down the back, chest, abdomen, arms, and shoulder - Rash is irritating and sometimes appears in peeling patches - Cool showers provide temporary relief  HTN w/blood pressure variability - Elevated blood pressure upon arrival at clinic, possibly related to pain or recent activity - Monitors blood pressure at home and describes overall control as fairly good but it fluctuates at times  Cardiac amyloidosis surveillance - Scheduled for a perfusion test next week for cardiac amyloidosis monitoring      Physical Exam   Physical Exam Vitals reviewed.  Constitutional:      General: She is not in acute distress.    Appearance: Normal appearance. She is not ill-appearing.  HENT:     Head: Normocephalic and atraumatic.  Cardiovascular:     Rate and Rhythm: Normal rate and regular rhythm.     Pulses: Normal pulses.     Heart sounds: Normal heart sounds. No murmur heard.    No friction rub. No gallop.  Pulmonary:     Effort: Pulmonary effort is normal. No respiratory distress.     Breath sounds: Normal breath sounds. No wheezing.  Skin:    General: Skin is warm and dry.  Neurological:     Mental Status: She is alert and oriented to person, place, and time.  Psychiatric:        Mood and Affect: Mood normal.        Behavior: Behavior normal.        Thought Content: Thought content normal.        Judgment: Judgment  normal.    Assessment & Plan   Assessment and Plan    Chronic pruritic eczematous rash with eosinophilia Persistent rash unresponsive to multiple medications. Biopsy showed eosinophils, but no improvement with medication elimination. Differential includes atopic/contact dermatitis, scabies, and cutaneous T-cell lymphoma. Black light examination negative for fungal or bacterial luminescence. - Refer to dermatology for second opinion per patient request. - Request biopsy report from Dr. Dorthula office for review.  Suspected cardiac amyloidosis Suspected due to deconditioning, cognitive changes, and recurrent carpal tunnel syndrome. Managed by Cardiology  Aortic stenosis Aortic stenosis with reduced heart sounds post-procedure. Symptoms include occasional neck pain and variable blood pressure. Managed by Cardiology.   Hypertension Variable blood pressure, possibly related to pain and stress. - BP recheck remains elevated. - Recommend continued home monitoring, - If consistently elevated, return for further evaluation or follow up with Cardiology.       Follow up   Return in about 6 months (around 09/06/2024) for chronic disease follow up. __________________________________ Zada FREDRIK Palin, DNP, APRN, FNP-BC Primary Care and Sports Medicine [facility]

## 2024-03-11 ENCOUNTER — Ambulatory Visit (HOSPITAL_COMMUNITY)
Admission: RE | Admit: 2024-03-11 | Discharge: 2024-03-11 | Disposition: A | Discharge status: Home / Self Care | Source: Ambulatory Visit | Attending: Physician Assistant | Admitting: Physician Assistant

## 2024-03-11 DIAGNOSIS — K449 Diaphragmatic hernia without obstruction or gangrene: Secondary | ICD-10-CM | POA: Diagnosis not present

## 2024-03-11 DIAGNOSIS — R42 Dizziness and giddiness: Secondary | ICD-10-CM

## 2024-03-11 DIAGNOSIS — E278 Other specified disorders of adrenal gland: Secondary | ICD-10-CM | POA: Diagnosis not present

## 2024-03-11 DIAGNOSIS — R0602 Shortness of breath: Secondary | ICD-10-CM | POA: Diagnosis not present

## 2024-03-11 DIAGNOSIS — Z952 Presence of prosthetic heart valve: Secondary | ICD-10-CM | POA: Diagnosis not present

## 2024-03-11 DIAGNOSIS — G5603 Carpal tunnel syndrome, bilateral upper limbs: Secondary | ICD-10-CM | POA: Diagnosis not present

## 2024-03-11 DIAGNOSIS — J9859 Other diseases of mediastinum, not elsewhere classified: Secondary | ICD-10-CM | POA: Diagnosis not present

## 2024-03-11 MED ORDER — TECHNETIUM TC 99M PYROPHOSPHATE
20.7000 | Freq: Once | INTRAVENOUS | Status: AC
  Administered 2024-03-11: 20.7 via INTRAVENOUS

## 2024-03-11 MED ORDER — TECHNETIUM TC 99M PYROPHOSPHATE: 20.6000 | Freq: Once | INTRAVENOUS | Status: DC

## 2024-03-12 ENCOUNTER — Ambulatory Visit: Payer: Self-pay | Admitting: Physician Assistant

## 2024-03-13 ENCOUNTER — Encounter: Payer: Self-pay | Admitting: Physician Assistant

## 2024-03-13 LAB — UPEP/UIFE/LIGHT CHAINS/TP, 24-HR UR: Kappa/Lambda Ratio,U: 3.09 (ref 1.83–14.26)

## 2024-03-14 LAB — MULTIPLE MYELOMA PANEL, SERUM
Albumin SerPl Elph-Mcnc: 3.3 g/dL (ref 2.9–4.4)
Albumin/Glob SerPl: 0.9 (ref 0.7–1.7)
Alpha 1: 0.2 g/dL (ref 0.0–0.4)
Alpha2 Glob SerPl Elph-Mcnc: 0.9 g/dL (ref 0.4–1.0)
B-Globulin SerPl Elph-Mcnc: 1.1 g/dL (ref 0.7–1.3)
Gamma Glob SerPl Elph-Mcnc: 1.5 g/dL (ref 0.4–1.8)
Globulin, Total: 3.8 g/dL (ref 2.2–3.9)
IgA/Immunoglobulin A, Serum: 135 mg/dL (ref 64–422)
IgG (Immunoglobin G), Serum: 1549 mg/dL (ref 586–1602)
IgM (Immunoglobulin M), Srm: 105 mg/dL (ref 26–217)
Total Protein: 7.1 g/dL (ref 6.0–8.5)

## 2024-03-14 LAB — UPEP/UIFE/LIGHT CHAINS/TP, 24-HR UR
% BETA, Urine: 30.3
ALBUMIN, U: 36.3
ALPHA 1 URINE: 4.5
ALPHA-2-GLOBULIN, U: 10.8
Free Lambda Lt Chains,Ur: 13.39 mg/L (ref 1.17–86.46)
GAMMA GLOBULIN URINE: 18
Kappa/Lambda Ratio,U: 4.34 mg/L (ref 1.83–15.21)
NOTE:: 13.39 mg/L (ref 1.17–86.46)
Protein, 24H Urine: 270 mg/(24.h) — ABNORMAL HIGH (ref 30–150)
Protein, Ur: 15 mg/dL

## 2024-03-17 ENCOUNTER — Encounter: Payer: Self-pay | Admitting: Cardiology

## 2024-03-17 ENCOUNTER — Ambulatory Visit: Attending: Cardiology | Admitting: Cardiology

## 2024-03-17 VITALS — BP 142/72 | HR 76 | Ht 65.0 in | Wt 175.0 lb

## 2024-03-17 DIAGNOSIS — M25642 Stiffness of left hand, not elsewhere classified: Secondary | ICD-10-CM | POA: Diagnosis not present

## 2024-03-17 DIAGNOSIS — Z950 Presence of cardiac pacemaker: Secondary | ICD-10-CM | POA: Diagnosis not present

## 2024-03-17 DIAGNOSIS — Z4789 Encounter for other orthopedic aftercare: Secondary | ICD-10-CM | POA: Diagnosis not present

## 2024-03-17 DIAGNOSIS — I480 Paroxysmal atrial fibrillation: Secondary | ICD-10-CM | POA: Insufficient documentation

## 2024-03-17 DIAGNOSIS — I10 Essential (primary) hypertension: Secondary | ICD-10-CM | POA: Insufficient documentation

## 2024-03-17 DIAGNOSIS — R0602 Shortness of breath: Secondary | ICD-10-CM | POA: Diagnosis not present

## 2024-03-17 DIAGNOSIS — M1812 Unilateral primary osteoarthritis of first carpometacarpal joint, left hand: Secondary | ICD-10-CM | POA: Diagnosis not present

## 2024-03-17 DIAGNOSIS — M6281 Muscle weakness (generalized): Secondary | ICD-10-CM | POA: Diagnosis not present

## 2024-03-17 DIAGNOSIS — Z952 Presence of prosthetic heart valve: Secondary | ICD-10-CM | POA: Diagnosis not present

## 2024-03-17 NOTE — Patient Instructions (Signed)

## 2024-03-21 ENCOUNTER — Ambulatory Visit: Payer: Self-pay | Admitting: Physician Assistant

## 2024-04-01 ENCOUNTER — Ambulatory Visit (INDEPENDENT_AMBULATORY_CARE_PROVIDER_SITE_OTHER)

## 2024-04-01 DIAGNOSIS — I480 Paroxysmal atrial fibrillation: Secondary | ICD-10-CM

## 2024-04-01 LAB — CUP PACEART REMOTE DEVICE CHECK
Battery Remaining Longevity: 127 mo
Battery Voltage: 3 V
Brady Statistic AP VP Percent: 12.96 %
Brady Statistic AP VS Percent: 0 %
Brady Statistic AS VP Percent: 83.01 %
Brady Statistic AS VS Percent: 4.02 %
Brady Statistic RA Percent Paced: 15.12 %
Brady Statistic RV Percent Paced: 95.98 %
Date Time Interrogation Session: 20250923062447
Implantable Lead Connection Status: 753985
Implantable Lead Connection Status: 753985
Implantable Lead Implant Date: 20210921
Implantable Lead Implant Date: 20210921
Implantable Lead Location: 753859
Implantable Lead Location: 753860
Implantable Lead Model: 3830
Implantable Lead Model: 5076
Implantable Pulse Generator Implant Date: 20210921
Lead Channel Impedance Value: 342 Ohm
Lead Channel Impedance Value: 380 Ohm
Lead Channel Impedance Value: 380 Ohm
Lead Channel Impedance Value: 532 Ohm
Lead Channel Pacing Threshold Amplitude: 0.625 V
Lead Channel Pacing Threshold Amplitude: 0.875 V
Lead Channel Pacing Threshold Pulse Width: 0.4 ms
Lead Channel Pacing Threshold Pulse Width: 0.4 ms
Lead Channel Sensing Intrinsic Amplitude: 2.375 mV
Lead Channel Sensing Intrinsic Amplitude: 2.375 mV
Lead Channel Sensing Intrinsic Amplitude: 27.5 mV
Lead Channel Sensing Intrinsic Amplitude: 27.5 mV
Lead Channel Setting Pacing Amplitude: 1.5 V
Lead Channel Setting Pacing Amplitude: 2 V
Lead Channel Setting Pacing Pulse Width: 0.4 ms
Lead Channel Setting Sensing Sensitivity: 0.9 mV
Zone Setting Status: 755011

## 2024-04-02 ENCOUNTER — Ambulatory Visit: Payer: Self-pay | Admitting: Cardiology

## 2024-04-02 NOTE — Progress Notes (Signed)
 Remote PPM Transmission

## 2024-04-03 NOTE — Progress Notes (Signed)
 Remote PPM Transmission

## 2024-04-11 DIAGNOSIS — D3131 Benign neoplasm of right choroid: Secondary | ICD-10-CM | POA: Diagnosis not present

## 2024-04-11 DIAGNOSIS — H43813 Vitreous degeneration, bilateral: Secondary | ICD-10-CM | POA: Diagnosis not present

## 2024-04-11 DIAGNOSIS — H353231 Exudative age-related macular degeneration, bilateral, with active choroidal neovascularization: Secondary | ICD-10-CM | POA: Diagnosis not present

## 2024-04-11 DIAGNOSIS — H35033 Hypertensive retinopathy, bilateral: Secondary | ICD-10-CM | POA: Diagnosis not present

## 2024-04-25 DIAGNOSIS — L2084 Intrinsic (allergic) eczema: Secondary | ICD-10-CM | POA: Diagnosis not present

## 2024-04-25 DIAGNOSIS — L2089 Other atopic dermatitis: Secondary | ICD-10-CM | POA: Diagnosis not present

## 2024-04-29 DIAGNOSIS — J339 Nasal polyp, unspecified: Secondary | ICD-10-CM | POA: Diagnosis not present

## 2024-04-29 DIAGNOSIS — J328 Other chronic sinusitis: Secondary | ICD-10-CM | POA: Diagnosis not present

## 2024-04-29 DIAGNOSIS — J300 Vasomotor rhinitis: Secondary | ICD-10-CM | POA: Diagnosis not present

## 2024-04-29 DIAGNOSIS — R0982 Postnasal drip: Secondary | ICD-10-CM | POA: Diagnosis not present

## 2024-04-29 DIAGNOSIS — Z9889 Other specified postprocedural states: Secondary | ICD-10-CM | POA: Diagnosis not present

## 2024-05-01 DIAGNOSIS — C8290 Follicular lymphoma, unspecified, unspecified site: Secondary | ICD-10-CM | POA: Diagnosis not present

## 2024-05-14 ENCOUNTER — Other Ambulatory Visit: Payer: Self-pay | Admitting: Cardiology

## 2024-05-16 NOTE — Telephone Encounter (Signed)
 Prescription refill request for Eliquis  received. Indication: A. FIB Last office visit: 03/17/24 Scr: 0.94 (EPIC 01/16/24) Age: 89 Weight: 79.4 kg  Per protocol medication okay to refill at current dose.

## 2024-05-19 DIAGNOSIS — J4520 Mild intermittent asthma, uncomplicated: Secondary | ICD-10-CM | POA: Diagnosis not present

## 2024-05-30 ENCOUNTER — Encounter: Payer: Self-pay | Admitting: Cardiology

## 2024-05-30 ENCOUNTER — Ambulatory Visit: Attending: Cardiology | Admitting: Cardiology

## 2024-05-30 VITALS — BP 146/80 | HR 58 | Ht 65.0 in | Wt 179.0 lb

## 2024-05-30 DIAGNOSIS — I480 Paroxysmal atrial fibrillation: Secondary | ICD-10-CM | POA: Insufficient documentation

## 2024-05-30 DIAGNOSIS — I493 Ventricular premature depolarization: Secondary | ICD-10-CM | POA: Insufficient documentation

## 2024-05-30 DIAGNOSIS — I471 Supraventricular tachycardia, unspecified: Secondary | ICD-10-CM | POA: Insufficient documentation

## 2024-05-30 DIAGNOSIS — D6869 Other thrombophilia: Secondary | ICD-10-CM | POA: Diagnosis not present

## 2024-05-30 DIAGNOSIS — I2510 Atherosclerotic heart disease of native coronary artery without angina pectoris: Secondary | ICD-10-CM | POA: Diagnosis not present

## 2024-05-30 DIAGNOSIS — R42 Dizziness and giddiness: Secondary | ICD-10-CM | POA: Diagnosis not present

## 2024-05-30 LAB — CUP PACEART INCLINIC DEVICE CHECK
Date Time Interrogation Session: 20251121194156
Implantable Lead Connection Status: 753985
Implantable Lead Connection Status: 753985
Implantable Lead Implant Date: 20210921
Implantable Lead Implant Date: 20210921
Implantable Lead Location: 753859
Implantable Lead Location: 753860
Implantable Lead Model: 3830
Implantable Lead Model: 5076
Implantable Pulse Generator Implant Date: 20210921

## 2024-05-30 NOTE — Progress Notes (Signed)
 Electrophysiology Office Note:   Date:  05/30/2024  ID:  [name], [name] [birth year], MRN [id number]  Primary Cardiologist: [name] Shallow, MD Primary Heart Failure: None Electrophysiologist: Monte Zinni Gladis Norton, MD      History of Present Illness:   [name] is a 89 y.o. female with h/o hypertension, hyperlipidemia, symptomatic bradycardia, SVT, PVCs, aortic stenosis post TAVR seen today for routine electrophysiology followup.   she denies chest pain, palpitations, dyspnea, PND, orthopnea, nausea, vomiting, dizziness, syncope, edema, weight gain, or early satiety.   Discussed the use of AI scribe software for clinical note transcription with the patient, who gave verbal consent to proceed.  History of Present Illness [name] is an 89 year old female with a pacemaker who presents with episodes of feeling faint and weak.  She experiences episodes of feeling faint and weak, described as 'fading out', which occur without any specific trigger and can happen while sitting or during activities.  She also reports episodes of briefly losing vision. There is a strong family history of strokes, raising concern for a vascular cause of her symptoms.  She is currently on Eliquis  to prevent strokes and has a pacemaker that is pacing her heart 96% of the time. Since April 27, 2024, she has had three episodes of abnormal heart rhythms, each lasting between one and three seconds, occurring at various times, including early morning hours.  She is concerned about the pacemaker's data transmission, as she spends time in the kitchen and living room, away from the device's usual location beside her bed.    Review of systems complete and found to be negative unless listed in HPI.      EP Information / Studies Reviewed:    EKG is not ordered today. EKG from 03/17/24 reviewed which showed A sense, V pace      PPM Interrogation-  reviewed in detail today,  See PACEART report.  Device  History: Medtronic Dual Chamber PPM implanted 03/30/2020 for Symptomatic bradycardia  Risk Assessment/Calculations:    CHA2DS2-VASc Score = 5   This indicates a 7.2% annual risk of stroke. The patient's score is based upon: CHF History: 1 HTN History: 1 Diabetes History: 0 Stroke History: 0 Vascular Disease History: 0 Age Score: 2 Gender Score: 1            Physical Exam:   VS:  BP (!) 146/80 (BP Location: Right Arm, Patient Position: Sitting, Cuff Size: Normal)   Pulse (!) 58   Ht 5' 5 (1.651 m)   Wt 179 lb (81.2 kg)   SpO2 95%   BMI 29.79 kg/m    Wt Readings from Last 3 Encounters:  05/30/24 179 lb (81.2 kg)  03/17/24 175 lb (79.4 kg)  03/07/24 175 lb (79.4 kg)     GEN: Well nourished, well developed in no acute distress NECK: No JVD; No carotid bruits CARDIAC: Regular rate and rhythm, no murmurs, rubs, gallops RESPIRATORY:  Clear to auscultation without rales, wheezing or rhonchi  ABDOMEN: Soft, non-tender, non-distended EXTREMITIES:  No edema; No deformity   ASSESSMENT AND PLAN:    Symptomatic bradycardia s/p Medtronic PPM  Normal PPM function See Pace Art report No changes today  2.  SVT: Likely related to AVNRT.  Has not wanted ablation in the past.  Continue metoprolol .  3.  PVCs: Burden of 8.6%.  Continue monitoring.  4.  Paroxysmal atrial fibrillation: Low burden on device interrogation.    5.  Secondary hypercoagulable state: On Eliquis   6.  Aortic stenosis: Post TAVR.  Plan per primary cardiology  7.  Hypertension:mildly elevated. Plan per PCP  8.  Dizziness: Unlikely related to her rhythm issues.  Patient family is inquiring about carotid ultrasound.  Lashone Stauber order today.  Disposition:   Follow up with EP Team in 12 months  Signed, Jyquan Kenley Gladis Norton, MD

## 2024-05-30 NOTE — Patient Instructions (Addendum)
 Medication Instructions:  Your physician recommends that you continue on your current medications as directed. Please refer to the Current Medication list given to you today.  *If you need a refill on your cardiac medications before your next appointment, please call your pharmacy*  Lab Work: None ordered  If you have any lab test that is abnormal or we need to change your treatment, we will call you to review the results.  Testing/Procedures: Your physician has requested that you have a carotid duplex. This test is an ultrasound of the carotid arteries in your neck. It looks at blood flow through these arteries that supply the brain with blood. Allow one hour for this exam. There are no restrictions or special instructions.   Follow-Up: At [facility], you and your health needs are our priority.  As part of our continuing mission to provide you with exceptional heart care, our providers are all part of one team.  This team includes your primary Cardiologist (physician) and Advanced Practice Providers or APPs (Physician Assistants and Nurse Practitioners) who all work together to provide you with the care you need, when you need it.  Your next appointment:   1 year(s)  Provider:   You will see one of the following Advanced Practice Providers on your designated Care Team:   Charlies Arthur, PA-C Michael Andy Tillery, PA-C Suzann Riddle, NP Daphne Barrack, NP Artist Pouch, PA-C       Thank you for choosing [facility]!!   Maeola Domino, RN [phone]

## 2024-06-01 ENCOUNTER — Ambulatory Visit: Payer: Self-pay | Admitting: Cardiology

## 2024-06-03 ENCOUNTER — Ambulatory Visit (HOSPITAL_COMMUNITY)
Admission: RE | Admit: 2024-06-03 | Discharge: 2024-06-03 | Disposition: A | Discharge status: Home / Self Care | Source: Ambulatory Visit | Attending: Cardiology | Admitting: Cardiology

## 2024-06-03 ENCOUNTER — Ambulatory Visit: Payer: Self-pay

## 2024-06-03 DIAGNOSIS — R42 Dizziness and giddiness: Secondary | ICD-10-CM | POA: Insufficient documentation

## 2024-06-03 DIAGNOSIS — I2510 Atherosclerotic heart disease of native coronary artery without angina pectoris: Secondary | ICD-10-CM | POA: Diagnosis not present

## 2024-06-03 DIAGNOSIS — R55 Syncope and collapse: Secondary | ICD-10-CM

## 2024-06-03 NOTE — Addendum Note (Signed)
 Addended byBETHA WILLO MINI on: 06/03/2024 12:56 PM   Modules accepted: Orders

## 2024-06-03 NOTE — Telephone Encounter (Signed)
Referral to Neurology placed.

## 2024-06-03 NOTE — Telephone Encounter (Signed)
 FYI Only or Action Required?: FYI only for provider: appointment scheduled on 11.26.25.  Patient was last seen in primary care on 03/07/2024 by Willo Mini, NP.  Called Nurse Triage reporting Neurologic Problem.  Symptoms began several months ago.  Interventions attempted: Rest, hydration, or home remedies.  Symptoms are: gradually worsening.  Triage Disposition: See Physician Within 24 Hours  Patient/caregiver understands and will follow disposition?: Yes  Copied from CRM #[id number]. Topic: Clinical - Red Word Triage >> Jun 03, 2024  8:59 AM Olam RAMAN wrote: Red Word that prompted transfer to Nurse Triage: Pt dghtr Josette called stated Pt is having passing out spells.. caller stated not dizzy spells and her vision goes out. asked for a nuero referral to check Pt Pt not with caller Reason for Disposition  [1] MODERATE dizziness (e.g., interferes with normal activities) AND [2] has NOT been evaluated by doctor (or NP/PA) for this  (Exception: Dizziness caused by heat exposure, sudden standing, or poor fluid intake.)  Answer Assessment - Initial Assessment Questions Pt's daughter states she has been having these spells for about 9 months. She does have cardiac issues and arrhythmias so they thought it was related to that, but they have done a complete cardiac work up including lining up these episodes with a monitor and when they happen there isn't an arrhythmia. Dtr states that the patient feels like she is doing to pass out and gets a little weak and starts to drift to one side. Pt does sit down when she feels these coming on. Sometimes with these she starts to get tunnel vision and floaters like an ocular migraine but no pain. Dtr states they have increased in frequency. Pt hasn't actually passed out, just feels like she is going to. Position doesn't matter, it happens when she's standing, walking, even sitting. Dtr did request an US  of her carotids given family hx of stroke and she has HTN and  is on eliquis . That is being completed today. Dtr thinks next option is to see a neurologist or rule out any other cause. Dtr denies that patient currently has any weakness or numbness on one side of her body, chest pain, shortness of breath more than normal, vomiting or any other symptoms.    1. DESCRIPTION: Describe your dizziness.     Feels like she is going to pass out.  2. LIGHTHEADED: Do you feel lightheaded? (e.g., somewhat faint, woozy, weak upon standing)     yes 3. VERTIGO: Do you feel like either you or the room is spinning or tilting? (i.e., vertigo)     no 4. SEVERITY: How bad is it?  Do you feel like you are going to faint? Can you stand and walk?     Some times it get's bad.  5. ONSET:  When did the dizziness begin?     About 9 months 6. AGGRAVATING FACTORS: Does anything make it worse? (e.g., standing, change in head position)     no 7. CAUSE: What do you think is causing the dizziness? (e.g., decreased fluids or food, diarrhea, emotional distress, heat exposure, new medicine, sudden standing, vomiting; unknown)     unknown 8. RECURRENT SYMPTOM: Have you had dizziness before? If Yes, ask: When was the last time? What happened that time?     Just the last 9 months 9. OTHER SYMPTOMS: Do you have any other symptoms? (e.g., fever, chest pain, vomiting, diarrhea, bleeding)       Daughter denies  Protocols used: Dizziness - Lightheadedness-A-AH

## 2024-06-04 ENCOUNTER — Telehealth: Payer: Self-pay | Admitting: Family Medicine

## 2024-06-04 ENCOUNTER — Encounter: Payer: Self-pay | Admitting: Family Medicine

## 2024-06-04 ENCOUNTER — Ambulatory Visit (INDEPENDENT_AMBULATORY_CARE_PROVIDER_SITE_OTHER): Admitting: Family Medicine

## 2024-06-04 VITALS — BP 167/68 | HR 66 | Ht 65.0 in | Wt 180.1 lb

## 2024-06-04 DIAGNOSIS — C8230 Follicular lymphoma grade IIIa, unspecified site: Secondary | ICD-10-CM | POA: Diagnosis not present

## 2024-06-04 DIAGNOSIS — R42 Dizziness and giddiness: Secondary | ICD-10-CM | POA: Diagnosis not present

## 2024-06-04 DIAGNOSIS — I10 Essential (primary) hypertension: Secondary | ICD-10-CM

## 2024-06-04 DIAGNOSIS — Z95811 Presence of heart assist device: Secondary | ICD-10-CM | POA: Insufficient documentation

## 2024-06-04 DIAGNOSIS — L2089 Other atopic dermatitis: Secondary | ICD-10-CM | POA: Diagnosis not present

## 2024-06-04 DIAGNOSIS — R0602 Shortness of breath: Secondary | ICD-10-CM

## 2024-06-04 NOTE — Telephone Encounter (Signed)
 Please call her daughter and let her know that I would like for her to try increasing the valsartan  to 1-1/2 tabs daily.  I would like for her to try to go up and see if she is tolerating it well and to see if home blood pressures are improving.  Really like to see her blood pressure in the 140s instead of the 160s

## 2024-06-04 NOTE — Assessment & Plan Note (Signed)
 Followed by oncology it sounds like it is manageable

## 2024-06-04 NOTE — Progress Notes (Signed)
 Acute Office Visit  Patient ID: [name], female    DOB: Feb 26, 1935, 89 y.o.   MRN: [id number]  PCP: Willo Mini, NP  Chief Complaint  Patient presents with   Dizziness    Subjective:     HPI  Discussed the use of AI scribe software for clinical note transcription with the patient, who gave verbal consent to proceed.  History of Present Illness [name] is an 89 year old female with a history of heart surgery and valve replacement who presents with persistent lightheadedness. She is accompanied by her daughter.  Lightheadedness and disequilibrium - Persistent episodes for approximately 6-9 months, predating valve replacement surgery in April - Episodes typically last less than 1 minute; most recent episode lasted 2.5 minutes, the longest to date - During episodes, sensation of losing touch with reality and need to hold onto walls to prevent falling - Sensation of being pulled to one side, occurring more than once - Episodes occur while sitting or standing - No ear ringing or sensation of passing out - Occasional visual disturbances including floaters and temporary vision loss in one eye - No headaches or pressure - Pacemaker checked with no events correlating to symptoms - Ultrasound of neck performed to evaluate for vascular blockages; results pending  Dyspnea on exertion - History of shortness of breath with light exercise - No improvement in symptoms following valve replacement surgery  Cutaneous rash and eczema - Systemic rash present for many months, initially suspected to be drug-related - No improvement in rash despite discontinuation of medications one by one - Started on Dupixent with no improvement in rash - Rash is pruritic, located on torso and arms, with small bumps and knots - Eczema present on scalp, improved with Dupixent  Hematologic history - History of lymphoma - Undergoes regular CT scans for surveillance, which do not include the  brain  Renal function - Diminished kidney function, considered normal for age  Anticoagulation therapy - Currently taking Eliquis    ROS     Objective:    BP (!) 167/68   Pulse 66   Ht 5' 5 (1.651 m)   Wt 180 lb 1.9 oz (81.7 kg)   SpO2 99%   BMI 29.97 kg/m     Physical Exam Vitals and nursing note reviewed.  Constitutional:      Appearance: Normal appearance.  HENT:     Head: Normocephalic and atraumatic.     Right Ear: Tympanic membrane, ear canal and external ear normal.     Left Ear: Tympanic membrane, ear canal and external ear normal.  Eyes:     Conjunctiva/sclera: Conjunctivae normal.  Cardiovascular:     Rate and Rhythm: Normal rate and regular rhythm.  Pulmonary:     Effort: Pulmonary effort is normal.     Breath sounds: Normal breath sounds.  Musculoskeletal:     Comments: Decreased range of motion with rotation of the cervical spine less motion to the left compared to the right.  Skin:    General: Skin is warm and dry.     Comments: Pink scattered maculopapular round rash on upper arms.  Fine scale present  Neurological:     General: No focal deficit present.     Mental Status: She is alert and oriented to person, place, and time.     Comments: Negative Dix-Hallpike maneuver.  Psychiatric:        Mood and Affect: Mood normal.     Physical Exam NECK: Limited range of  motion in neck.    No results found for any visits on 06/04/24.     Assessment & Plan:   Problem List Items Addressed This Visit       Cardiovascular and Mediastinum   Benign essential hypertension   I have her try increasing her Diovan  to 1-1/2 tabs daily to see if tolerated and if blood pressures are improving.        Other   Presence of heart assist device [facility])   Pacemaker in place. Just had it checked, no recent events.       Follicular lymphoma (HCC) - Primary   Followed by oncology it sounds like it is manageable      Other Visit Diagnoses        Lightheadedness       Relevant Orders   CT ANGIO HEAD W OR WO CONTRAST   CT ANGIO NECK W OR WO CONTRAST     Shortness of breath         Papular atopic dermatitis           Assessment and Plan Assessment & Plan Lightheadedness and episodes of dizziness Chronic dizziness with increased severity and duration. Negative Dix-Hallpike. Differential includes neurovascular causes, vertigo, and inner ear issues. Neurology referral discussed. - Ordered CTA with contrast to evaluate brain and vascular circulation, pending kidney function assessment. - Referred to neurology for further evaluation. - Advised to sit or lean against a wall if dizzy to prevent falls.  Shortness of breath with mild exertion Shortness of breath unchanged post-aortic valve replacement. Cardiologist does not suspect vascular blockages.  Systemic pruritic papular dermatitis Chronic dermatitis with previous partial response to Dupixent. Dermatologist suspects papular dermatitis or mast cell activation.    No orders of the defined types were placed in this encounter.   No follow-ups on file.  Dorothyann Byars, MD [facility] at [facility]

## 2024-06-04 NOTE — Assessment & Plan Note (Signed)
 Pacemaker in place. Just had it checked, no recent events.

## 2024-06-04 NOTE — Assessment & Plan Note (Signed)
 I have her try increasing her Diovan  to 1-1/2 tabs daily to see if tolerated and if blood pressures are improving.

## 2024-06-09 NOTE — Telephone Encounter (Signed)
 Patient daughter Allean informed. With change Valsartan  to 1 1/2 tabs daily .  Will let us  know if patient tolerating well  and if numbers are improving .

## 2024-06-10 DIAGNOSIS — L2089 Other atopic dermatitis: Secondary | ICD-10-CM | POA: Diagnosis not present

## 2024-06-10 DIAGNOSIS — L409 Psoriasis, unspecified: Secondary | ICD-10-CM | POA: Diagnosis not present

## 2024-06-10 DIAGNOSIS — J4520 Mild intermittent asthma, uncomplicated: Secondary | ICD-10-CM | POA: Diagnosis not present

## 2024-06-10 DIAGNOSIS — R21 Rash and other nonspecific skin eruption: Secondary | ICD-10-CM | POA: Diagnosis not present

## 2024-06-11 DIAGNOSIS — H353231 Exudative age-related macular degeneration, bilateral, with active choroidal neovascularization: Secondary | ICD-10-CM | POA: Diagnosis not present

## 2024-06-11 DIAGNOSIS — D3131 Benign neoplasm of right choroid: Secondary | ICD-10-CM | POA: Diagnosis not present

## 2024-06-11 DIAGNOSIS — H43813 Vitreous degeneration, bilateral: Secondary | ICD-10-CM | POA: Diagnosis not present

## 2024-06-11 DIAGNOSIS — H35033 Hypertensive retinopathy, bilateral: Secondary | ICD-10-CM | POA: Diagnosis not present

## 2024-06-16 ENCOUNTER — Other Ambulatory Visit

## 2024-06-16 DIAGNOSIS — R42 Dizziness and giddiness: Secondary | ICD-10-CM | POA: Diagnosis not present

## 2024-06-16 MED ORDER — IOHEXOL 350 MG/ML SOLN
100.0000 mL | Freq: Once | INTRAVENOUS | Status: AC | PRN
  Administered 2024-06-16: 100 mL via INTRAVENOUS

## 2024-06-20 ENCOUNTER — Ambulatory Visit: Payer: Self-pay | Admitting: Family Medicine

## 2024-06-20 NOTE — Progress Notes (Signed)
 Hi [name], we have your CT Angio results back of the head and the neck.  You do have a small amount of plaque in your carotids but it is not causing a major blockage at this point which is fantastic.  They did not see any blockages in the small blood vessels at the bottom of your brain.  No aneurysm.  Next step would be to refer you to neurology if you are okay with that then please let us  know and we will be happy to place a referral for you.

## 2024-06-20 NOTE — Telephone Encounter (Signed)
 Patient informed of results. She is going to speak with her daughter before she decides on neurology referral or not.

## 2024-06-30 ENCOUNTER — Encounter

## 2024-07-01 ENCOUNTER — Ambulatory Visit

## 2024-07-01 DIAGNOSIS — I480 Paroxysmal atrial fibrillation: Secondary | ICD-10-CM | POA: Diagnosis not present

## 2024-07-02 LAB — CUP PACEART REMOTE DEVICE CHECK
Battery Remaining Longevity: 123 mo
Battery Voltage: 3 V
Brady Statistic AP VP Percent: 16.31 %
Brady Statistic AP VS Percent: 0 %
Brady Statistic AS VP Percent: 78.74 %
Brady Statistic AS VS Percent: 4.94 %
Brady Statistic RA Percent Paced: 18.69 %
Brady Statistic RV Percent Paced: 95.05 %
Date Time Interrogation Session: 20251222234119
Implantable Lead Connection Status: 753985
Implantable Lead Connection Status: 753985
Implantable Lead Implant Date: 20210921
Implantable Lead Implant Date: 20210921
Implantable Lead Location: 753859
Implantable Lead Location: 753860
Implantable Lead Model: 3830
Implantable Lead Model: 5076
Implantable Pulse Generator Implant Date: 20210921
Lead Channel Impedance Value: 342 Ohm
Lead Channel Impedance Value: 380 Ohm
Lead Channel Impedance Value: 380 Ohm
Lead Channel Impedance Value: 532 Ohm
Lead Channel Pacing Threshold Amplitude: 0.625 V
Lead Channel Pacing Threshold Amplitude: 0.875 V
Lead Channel Pacing Threshold Pulse Width: 0.4 ms
Lead Channel Pacing Threshold Pulse Width: 0.4 ms
Lead Channel Sensing Intrinsic Amplitude: 2.5 mV
Lead Channel Sensing Intrinsic Amplitude: 2.5 mV
Lead Channel Sensing Intrinsic Amplitude: 23.375 mV
Lead Channel Sensing Intrinsic Amplitude: 23.375 mV
Lead Channel Setting Pacing Amplitude: 1.5 V
Lead Channel Setting Pacing Amplitude: 2 V
Lead Channel Setting Pacing Pulse Width: 0.4 ms
Lead Channel Setting Sensing Sensitivity: 0.9 mV
Zone Setting Status: 755011

## 2024-07-02 NOTE — Progress Notes (Signed)
 Remote PPM Transmission

## 2024-07-04 ENCOUNTER — Ambulatory Visit: Payer: Self-pay | Admitting: Cardiology

## 2024-07-15 ENCOUNTER — Other Ambulatory Visit: Payer: Self-pay | Admitting: Cardiology

## 2024-07-16 ENCOUNTER — Telehealth: Payer: Self-pay | Admitting: Cardiology

## 2024-07-16 NOTE — Telephone Encounter (Signed)
" °*  STAT* If patient is at the pharmacy, call can be transferred to refill team.   1. Which medications need to be refilled? (please list name of each medication and dose if known) omeprazole  (PRILOSEC) 40 MG capsule   2. Which pharmacy/location (including street and city if local pharmacy) is medication to be sent to? [facility] - [place], [place] - [address]   3. Do they need a 30 day or 90 day supply? 90  "

## 2024-07-17 MED ORDER — OMEPRAZOLE 40 MG PO CPDR: DELAYED_RELEASE_CAPSULE | ORAL | 2 refills | Status: AC

## 2024-07-17 NOTE — Telephone Encounter (Signed)
 Pt's medication was sent to pt's pharmacy as requested. Confirmation received.

## 2024-07-18 ENCOUNTER — Other Ambulatory Visit: Payer: Self-pay | Admitting: Cardiology

## 2024-07-22 ENCOUNTER — Encounter: Payer: Self-pay | Admitting: Neurology

## 2024-07-22 ENCOUNTER — Ambulatory Visit: Admitting: Neurology

## 2024-07-22 ENCOUNTER — Other Ambulatory Visit: Payer: Self-pay | Admitting: Cardiology

## 2024-07-22 VITALS — Ht 65.0 in | Wt 179.0 lb

## 2024-07-22 DIAGNOSIS — I480 Paroxysmal atrial fibrillation: Secondary | ICD-10-CM | POA: Diagnosis not present

## 2024-07-22 DIAGNOSIS — C8230 Follicular lymphoma grade IIIa, unspecified site: Secondary | ICD-10-CM | POA: Diagnosis not present

## 2024-07-22 DIAGNOSIS — Z95811 Presence of heart assist device: Secondary | ICD-10-CM

## 2024-07-22 DIAGNOSIS — Z950 Presence of cardiac pacemaker: Secondary | ICD-10-CM | POA: Diagnosis not present

## 2024-07-22 DIAGNOSIS — R55 Syncope and collapse: Secondary | ICD-10-CM | POA: Diagnosis not present

## 2024-07-22 MED ORDER — LEVETIRACETAM 250 MG PO TABS: 250.0000 mg | ORAL_TABLET | Freq: Two times a day (BID) | ORAL | 1 refills | Status: AC

## 2024-07-22 NOTE — Patient Instructions (Addendum)
 " Levetiracetam  Tablets What is this medication? LEVETIRACETAM  (lee ve tye RA se tam) prevents and controls seizures in people with epilepsy. It works by calming overactive nerves in your body. This medicine may be used for other purposes; ask your health care provider or pharmacist if you have questions. COMMON BRAND NAME(S): Keppra , Roweepra  What should I tell my care team before I take this medication? They need to know if you have any of these conditions: Kidney disease Suicidal thoughts, plans, or attempt by you or a family member An unusual or allergic reaction to levetiracetam , other medications, foods, dyes, or preservatives Pregnant or trying to get pregnant Breast-feeding How should I use this medication? Take this medication by mouth with a glass of water . Follow the directions on the prescription label. Swallow the tablets whole. Do not crush or chew this medication. You may take this medication with or without food. Take your doses at regular intervals. Do not take your medication more often than directed. Do not stop taking this medication or any of your seizure medications unless instructed by your care team. Stopping your medication suddenly can increase your seizures or their severity. A special MedGuide will be given to you by the pharmacist with each prescription and refill. Be sure to read this information carefully each time. Contact your care team about the use of this medication in children. While this medication may be prescribed for children as young as 42 years of age for selected conditions, precautions do apply. Overdosage: If you think you have taken too much of this medicine contact a poison control center or emergency room at once. NOTE: This medicine is only for you. Do not share this medicine with others. What if I miss a dose? If you miss a dose, take it as soon as you can. If it is almost time for your next dose, take only that dose. Do not take double or extra  doses. What may interact with this medication? This medication may interact with the following: Carbamazepine Colesevelam Probenecid Sevelamer This list may not describe all possible interactions. Give your health care provider a list of all the medicines, herbs, non-prescription drugs, or dietary supplements you use. Also tell them if you smoke, drink alcohol , or use illegal drugs. Some items may interact with your medicine. What should I watch for while using this medication? Visit your care team for a regular check on your progress. Wear a medical identification bracelet or chain to say you have epilepsy, and carry a card that lists all your medications. This medication may cause serious skin reactions. They can happen weeks to months after starting the medication. Contact your care team right away if you notice fevers or flu-like symptoms with a rash. The rash may be red or purple and then turn into blisters or peeling of the skin. You may also notice a red rash with swelling of the face, lips, or lymph nodes in your neck or under your arms. It is important to take this medication exactly as instructed by your care team. When first starting treatment, your dose may need to be adjusted. It may take weeks or months before your dose is stable. You should contact your care team if your seizures get worse or if you have any new types of seizures. This medication may affect your coordination, reaction time, or judgment. Do not drive or operate machinery until you know how this medication affects you. Sit up or stand slowly to reduce the risk of dizzy or  fainting spells. Drinking alcohol  with this medication can increase the risk of these side effects. This medication may cause thoughts of suicide or depression. This includes sudden changes in mood, behaviors, or thoughts. These changes can happen at any time but are more common in the beginning of treatment or after a change in dose. Call your care team  right away if you experience these thoughts or worsening depression. If you become pregnant while using this medication, you may enroll in the [place] Antiepileptic Drug Pregnancy Registry by calling [phone]. This registry collects information about the safety of antiepileptic medication use during pregnancy. What side effects may I notice from receiving this medication? Side effects that you should report to your care team as soon as possible: Allergic reactions or angioedema--skin rash, itching or hives, swelling of the face, eyes, lips, tongue, arms, or legs, trouble swallowing or breathing Increase in blood pressure in children Infection--fever, chills, cough, or sore throat Loss of balance or coordination Low red blood cell level--unusual weakness or fatigue, dizziness, headache, trouble breathing Mood and behavior changes--anxiety, nervousness, confusion, hallucinations, irritability, hostility, thoughts of suicide or self-harm, worsening mood, feelings of depression Rash, fever, and swollen lymph nodes Redness, swelling, and blistering of the skin over hands and feet Trouble walking Unusual bruising or bleeding Unusual weakness or fatigue Side effects that usually do not require medical attention (report these to your care team if they continue or are bothersome): Dizziness Drowsiness Fatigue Irritability Loss of appetite This list may not describe all possible side effects. Call your doctor for medical advice about side effects. You may report side effects to FDA at 1-800-FDA-1088. Where should I keep my medication? Keep out of reach of children. Store at room temperature between 15 and 30 degrees C (59 and 86 degrees F). Throw away any unused medication after the expiration date. NOTE: This sheet is a summary. It may not cover all possible information. If you have questions about this medicine, talk to your doctor, pharmacist, or health care provider.  2024  Elsevier/Gold Standard (2023-06-08 00:00:00) Assessment: Total time for face to face interview and examination, for review of  images and laboratory testing, neurophysiology testing and pre-existing records, including out-of -network , was 55 minutes. Assessment is as follows here:  1) SPELLS :   paroxysmal spells, stereotypical and unprovoked spells of  aura, seconds to minutes of  loss of awareness, followed by fatigue and associated with a risk of falling.   2) no headaches, but differential is  ophthalmic migraines.  She has no Gx of migraines.    Plan:  Treatment plan and additional workup planned after today includes:   MRI brain would be nice, can she have the MRI at [facility] ? .    We look for vascular or lymphoma related brain changes.  EEG ordered.   This will be a 30 minutes study.  Started  Keppra  250 mg bid  and see if spells stop.  ( Keep journal )   Rv in 4-6 months with MD ( me)     Seizure, Adult A seizure is a sudden burst of abnormal activity in the brain. Seizures usually last from 30 seconds to 2 minutes. There are many types of seizures. And they can cause many different symptoms. What are the causes? Common causes of a seizure include: Fever or infection. Problems that affect the brain. These may include: A brain or head injury. A stroke. A brain tumor. Low levels of blood sugar or salt.  Kidney problems or liver problems. Some inherited conditions. These are passed down from parent to child. Problems with a substance, such as: Having a reaction to a drug or a medicine. Stopping the use of a substance all of a sudden. When this causes problems, it's called withdrawal. Disorders that affect how you develop, such as autism spectrum disorder or cerebral palsy. Sometimes, the cause may not be known. Some people who have a seizure never have another one. A person who has repeated seizures over time without a clear cause has a condition called  epilepsy. What increases the risk? Having a family history of epilepsy. Having had a tonic-clonic seizure before. This type of seizure causes: The muscles of the whole body to tighten, or contract. Loss of consciousness. Having a head injury or a stroke in the past. Having had too little oxygen  at birth. What are the signs or symptoms? The symptoms vary depending on the type of seizure you have. Symptoms during a seizure Having convulsions. This means shaking with fast, jerky movements of muscles. Stiffness of the body. Breathing problems. Being confused. Staring or not responding to sound or touch. Head nodding, eye blinking, eye twitching, or fast eye movements. Drooling, grunting, or making clicking sounds with your mouth. Losing control of when you pee or poop. Symptoms before a seizure Feeling afraid, worried, or nervous. Feeling like you may vomit. Vertigo. This feels like: You are moving when you're not. Things around you are moving when they're not. Dj vu. This is a feeling of having seen or heard something before. Odd tastes or smells. Changes in how you see. You may see flashing lights or spots. Symptoms after a seizure Being confused. Feeling sleepy. Headache. Sore muscles. How is this diagnosed? A seizure may be diagnosed based on: A description of your symptoms. Video of your seizures can be helpful. Your medical history. A physical exam. Tests, such as: Blood tests. CT scan. MRI. Electroencephalogram, or EEG. This test measures electrical activity in the brain. A test of your spinal fluid. This is called a spinal tap or lumbar puncture. How is this treated? If your seizure stops on its own, you will not need treatment. If your seizure lasts longer than 5 minutes, you'll normally need treatment. This may include: Medicines given through an IV. Avoiding things, such as medicines, that are known to cause your seizures. Medicines to prevent seizures. These  are called antiepileptics. A device to prevent or control seizures. Eating foods that are low in carbohydrates and high in fat (ketogenic diet). Surgery. This is sometimes needed if you keep having seizures. Follow these instructions at home: Medicines Take your medicines only as told by your health care provider. Avoid anything that may keep your medicine from working, such as alcohol . Activity Follow your provider's advice about driving, swimming, and doing other things that would be dangerous if you had a seizure. Wait until your provider says it's safe for you to do these things. If you live in the [place] ask your local department of motor vehicles [facility]) when you can drive. Get enough rest and sleep. Not getting enough sleep can make seizures more likely to happen. Teaching others  Teach friends and family what to do if you have a seizure. Tell them to: Help you get down to the ground safely. Protect your head and body. Loosen any clothing around your neck. Turn you on your side. This helps keep your airway clear if you vomit. Know whether or not you need emergency  care. Stay with you until you are better. Also, tell them what not to do if you have a seizure. Tell them: They should not hold you down. They should not put anything in your mouth. General instructions Avoid anything that has caused you to have seizures. Keep a seizure diary. Write down: What you remember about each seizure. What you think might have caused each seizure. Keep all follow-up visits. Your provider may need to monitor your progress. Contact a health care provider if: You have another seizure or seizures. Call each time you have a seizure. You have a change in how often or when you have seizures. You keep having seizures with treatment. You have symptoms of being sick or having an infection. You are not able to take your medicine. Get help right away if: You have or someone has seen you have: A seizure  that lasts longer than 5 minutes. Many seizures in a row and you don't feel better between seizures. A seizure that makes it harder to breathe. A seizure that leaves you unable to speak or use a part of your body. You didn't wake up right away after a seizure. You injure yourself during a seizure. You have confusion or pain right after a seizure. These symptoms may be an emergency. Call 911 right away. Do not wait to see if the symptoms will go away. Do not drive yourself to the hospital. This information is not intended to replace advice given to you by your health care provider. Make sure you discuss any questions you have with your health care provider. Document Revised: 03/29/2023 Document Reviewed: 08/09/2022 Elsevier Patient Education  2024 Elsevier Inc.   Near-Syncope Near-syncope is when you suddenly feel like you might pass out or faint. This may also be called presyncope. During an episode of near-syncope, you may: Feel dizzy, weak, or light-headed. It may feel like the room is spinning. Feel like you may vomit (nauseous). See spots or see all white or all black. Have cold, clammy skin. Feel warm and sweaty. Hear ringing in your ears. This condition is caused by a sudden decrease in blood flow to the brain. This can result from many causes, but most of those causes are not dangerous. However, near-syncope may be a sign of a serious medical problem, so it is important to seek medical care. Follow these instructions at home: Medicines Take over-the-counter and prescription medicines only as told by your doctor. If you are taking blood pressure or heart medicine, get up slowly and spend many minutes getting ready to sit and then stand. This can help with dizziness. Lifestyle Do not drive, use machinery, or play sports until your doctor says it is okay. Do not drink alcohol . Do not smoke or use any products that contain nicotine or tobacco. If you need help quitting, ask your  doctor. Avoid hot tubs and saunas. General instructions Be aware of any changes in your symptoms. Talk with your doctor about your symptoms. You may need to have testing to help find the cause. If you start to feel like you might pass out, sit or lie down right away. If sitting, lower your head down between your legs. If lying down, raise (elevate) your feet above the level of your heart. Breathe deeply and steadily. Wait until all of the symptoms are gone. Have someone stay with you until you feel better. Drink enough fluid to keep your pee (urine) pale yellow. Avoid standing for a long time. If you must stand  for a long time, do movements such as: Moving your legs. Crossing your legs. Flexing and stretching your leg muscles. Squatting. Keep all follow-up visits. Contact a doctor if: You continue to have episodes of near fainting. Get help right away if: You pass out or faint. You have any of these symptoms: Fast or uneven heartbeats (palpitations). Pain in your chest, belly, or back. Shortness of breath. You have a seizure. You have a very bad headache. You are confused. You have trouble seeing. You are very weak. You have trouble walking. You are bleeding from your mouth or butt. You have black or tarry poop (stool). These symptoms may be an emergency. Get help right away. Call your local emergency services (911 in the [place] Do not wait to see if the symptoms will go away. Do not drive yourself to the hospital. Summary Near-syncope is when you suddenly feel like you might pass out or faint. This condition is caused by a sudden decrease in blood flow to the brain. Near-syncope may be a sign of a serious medical problem, so it is important to seek medical care. If you start to feel like you might pass out, sit or lie down right away. If sitting, lower your head down between your legs. If lying down, raise (elevate) your feet above the level of your heart. Talk with your  doctor about your symptoms. You may need to have testing to help find the cause. This information is not intended to replace advice given to you by your health care provider. Make sure you discuss any questions you have with your health care provider. Document Revised: 11/04/2020 Document Reviewed: 11/04/2020 Elsevier Patient Education  2024 Elsevier Inc.   Assessment: Total time for face to face interview and examination, for review of  images and laboratory testing, neurophysiology testing and pre-existing records, including out-of -network , was 55 minutes. Assessment is as follows here:  1)   paroxysmal spells, stereotypical and unprovoked spells of  aura, seconds to minutes of  loss of awareness, followed by fatigue and associated with a risk of falling.   2) no headaches, but differential is  ophthalmic migraines.  She has no Gx of migraines.    Plan:  Treatment plan and additional workup planned after today includes:   MRI brain would be nice, can she have the MRI at [place] ? .    We look for vascular or lymphoma related brain changes.  EEG ordered.   This will be a 30 minutes study.  Started  Keppra  250 mg bid  and see if spells stop.  ( Keep journal )   Rv in 4-6 months with MD ( me)   "

## 2024-07-22 NOTE — Progress Notes (Signed)
 [facility]  Headache Patient   Provider:  Dedra Gores, MD  Referring Provider: Willo Mini, NP Primary Care Physician:  Willo Mini, NP  Chief Complaint  Patient presents with   Dizziness    Rm 1 with daughter Pt is well, reports she has been having dizziness for about 3-4 months. She feels like she is going blank and vision disturbances.     HPI:  [name] is a 89 y.o. female and seen here on 07/22/2024 upon referral from Dr. Willo for a Consultation/ Evaluation of dizziness.     First onset before , she was dx with atrial fibrillation, followed by Dr Inocencio, she fnally got a pacemaker and her re syncipal events are much less frequent.   She has however a second type of spells, these last only seconds and come on with little warning, loses vision ( tunnel vision) and squiggly lines.  This was 2-3 times last week, losing vision - she has macular degeneration ( she is not sure if one eye or both are affected) , her mind is blank,  its a loss of awareness.   No headaches. Just fatigued after such spells.  She may suddenly loose muscle tone.   MRI angiography and doppler studies were negative. Soft tissues: Partially visualized anterior mediastinal mass which was better demonstrated on the prior chest CT from December 19, 1998 47. Lymphoma patient .  Pacemaker works well New onset of unsteady gait. Veering to the right .  At night time her left leg ' draws up and looks almost disfigured, she reports, it helps to move and to drink pickle juice.   History of TBI,  no injuries, had viral encephalitis  at age 62  (  Lymphoma with Retoximab  radiation.. Had back surgery.   Family history of migraine or other headaches: daughter has  migraine with aura.   Triggers known: spells occur any time of day, no triggers known.    Failed abortive therapies:none   Failed preventive therapies: none   This patient reports onset of spells since she had the pacemaker  implanted.  Over a year.    Review of Systems: Out of a complete 14 system review, the patient complains of only the following symptoms, and all other reviewed systems are negative.  Spells    Social History   Socioeconomic History   Marital status: Widowed    Spouse name: Not on file   Number of children: 1   Years of education: 85   Highest education level: 12th grade  Occupational History   Occupation: advertising account planner    Comment: retired  Tobacco Use   Smoking status: Never   Smokeless tobacco: Never  Vaping Use   Vaping status: Never Used  Substance and Sexual Activity   Alcohol  use: No   Drug use: No   Sexual activity: Not Currently    Partners: Male  Other Topics Concern   Not on file  Social History Narrative   No exercise. 2 cups of coffee a day    Social Drivers of Health   Tobacco Use: Low Risk (06/04/2024)   Patient History    Smoking Tobacco Use: Never    Smokeless Tobacco Use: Never    Passive Exposure: Not on file  Financial Resource Strain: Low Risk (06/03/2024)   Overall Financial Resource Strain (CARDIA)    Difficulty of Paying Living Expenses: Not very hard  Food Insecurity: No Food Insecurity (06/03/2024)   Epic    Worried  About Running Out of Food in the Last Year: Never true    Ran Out of Food in the Last Year: Never true  Transportation Needs: No Transportation Needs (06/03/2024)   Epic    Lack of Transportation (Medical): No    Lack of Transportation (Non-Medical): No  Physical Activity: Unknown (06/03/2024)   Exercise Vital Sign    Days of Exercise per Week: Patient declined    Minutes of Exercise per Session: Not on file  Stress: No Stress Concern Present (06/03/2024)   [employer] of Occupational Health - Occupational Stress Questionnaire    Feeling of Stress: Only a little  Social Connections: Moderately Isolated (06/03/2024)   Social Connection and Isolation Panel    Frequency of Communication with Friends and Family: Three  times a week    Frequency of Social Gatherings with Friends and Family: More than three times a week    Attends Religious Services: More than 4 times per year    Active Member of [employer] or Organizations: No    Attends Banker Meetings: Not on file    Marital Status: Widowed  Intimate Partner Violence: Not At Risk (01/17/2024)   Epic    Fear of Current or Ex-Partner: No    Emotionally Abused: No    Physically Abused: No    Sexually Abused: No  Depression (PHQ2-9): Medium Risk (06/04/2024)   Depression (PHQ2-9)    PHQ-2 Score: 8  Alcohol  Screen: Not on file  Housing: Low Risk (06/03/2024)   Epic    Unable to Pay for Housing in the Last Year: No    Number of Times Moved in the Last Year: 0    Homeless in the Last Year: No  Utilities: Not At Risk (01/17/2024)   Epic    Threatened with loss of utilities: No  Health Literacy: Not on file    Family History  Problem Relation Age of Onset   Skin cancer Mother    Stroke Mother    Prostate cancer Father    High blood pressure Father    Heart disease Father    Heart disease Other     Past Medical History:  Diagnosis Date   Acquired trigger finger 08/05/2013   Arthritis    Asthma    Atrial fibrillation (HCC)    paroxysmal , addressed  with metoprolol     Callus of foot 09/03/2018   Chronic sinusitis    Complication of anesthesia    Follicular lymphoma (HCC)    received Chemotherapy (2 years ago)  and radiation (last 12/2017)   Hyperlipidemia    Hypertension    Hypothyroidism    Meniere's disease    PONV (postoperative nausea and vomiting)    Presence of permanent cardiac pacemaker    Primary localized osteoarthritis of left knee 09/24/2018   Primary localized osteoarthritis of right knee 06/18/2018   Reflux    S/P FESS (functional endoscopic sinus surgery) 01/04/2023   S/P TAVR (transcatheter aortic valve replacement) 01/15/2024   s/p TAVR with a 26 mm Edwards Sapien 3 Ultra Resilia THV via the TF approach by  Dr. Verlin and Dr. Lucas.   Sebaceous cyst 09/03/2018   Severe aortic stenosis    Shingles 10/24/2019    Past Surgical History:  Procedure Laterality Date   ABDOMINAL HYSTERECTOMY     BONE MARROW BIOPSY  06/16/2014   at [facility]   CARPAL TUNNEL RELEASE     bilateral   CATARACT EXTRACTION Bilateral    CHOLECYSTECTOMY  INTRAOPERATIVE TRANSTHORACIC ECHOCARDIOGRAM N/A 01/15/2024   Procedure: ECHOCARDIOGRAM, TRANSTHORACIC;  Surgeon: Verlin Lonni BIRCH, MD;  Location: [facility];  Service: Cardiovascular;  Laterality: N/A;   KNEE ARTHROSCOPY     LUMBAR FUSION     NASAL SINUS SURGERY     PACEMAKER IMPLANT N/A 03/30/2020   Procedure: PACEMAKER IMPLANT;  Surgeon: Kelsie Agent, MD;  Location: [facility];  Service: Cardiovascular;  Laterality: N/A;   PARTIAL KNEE ARTHROPLASTY Right 06/18/2018   Procedure: UNICOMPARTMENTAL KNEE;  Surgeon: Josefina Chew, MD;  Location: [facility];  Service: Orthopedics;  Laterality: Right;   PARTIAL KNEE ARTHROPLASTY Left 09/24/2018   Procedure: UNICOMPARTMENTAL KNEE;  Surgeon: Josefina Chew, MD;  Location: [facility];  Service: Orthopedics;  Laterality: Left;   RIGHT HEART CATH AND CORONARY ANGIOGRAPHY N/A 11/28/2023   Procedure: RIGHT HEART CATH AND CORONARY ANGIOGRAPHY;  Surgeon: Verlin Lonni BIRCH, MD;  Location: [facility];  Service: Cardiovascular;  Laterality: N/A;   thumb surgery      Current Outpatient Medications  Medication Sig Dispense Refill   albuterol  (VENTOLIN  HFA) 108 (90 Base) MCG/ACT inhaler Inhale 2 puffs into the lungs every 6 (six) hours as needed for wheezing or shortness of breath. 8 g 0   amLODipine  (NORVASC ) 5 MG tablet TAKE 1 TABLET BY MOUTH DAILY 90 tablet 3   apixaban  (ELIQUIS ) 5 MG TABS tablet TAKE 1 TABLET BY MOUTH TWICE  DAILY 180 tablet 1   baclofen  (LIORESAL ) 10 MG tablet TAKE 1 TABLET BY MOUTH 3  TIMES DAILY AS NEEDED FOR  MUSCLE SPASMS 270 tablet 0   chlorthalidone  (HYGROTON ) 25 MG tablet  TAKE 1 TABLET BY MOUTH DAILY 90 tablet 3   clobetasol  (TEMOVATE ) 0.05 % external solution Apply 1 Application topically daily as needed (Psoriasis).     Clobetasol  Propionate 0.05 % shampoo Apply 1 application topically daily. As needed for scalp irritation from psoriasis 236 mL 3   Dupilumab 300 MG/2ML SOAJ Inject 600mg  under the skin on day 1. Then start maintenance dose 2 weeks later     fluticasone -salmeterol (ADVAIR) 250-50 MCG/ACT AEPB Inhale 1 puff into the lungs daily as needed (Shortness of breath).     gabapentin  (NEURONTIN ) 100 MG capsule TAKE 1 CAPSULE BY MOUTH TWICE  DAILY AS NEEDED 180 capsule 1   gentamicin  ointment (GARAMYCIN ) 0.1 % Apply 1 Application topically 2 (two) times daily. 15 g 0   levothyroxine  (SYNTHROID ) 112 MCG tablet TAKE 1 TABLET BY MOUTH DAILY  BEFORE BREAKFAST 90 tablet 3   meclizine  (ANTIVERT ) 25 MG tablet Take 1 tablet (25 mg total) by mouth 3 (three) times daily as needed for dizziness. 30 tablet 0   metoprolol  tartrate (LOPRESSOR ) 100 MG tablet TAKE 1 TABLET BY MOUTH TWICE  DAILY 180 tablet 3   mometasone  (ELOCON ) 0.1 % cream Apply 1 Application topically daily. To vaginal area as needed for redness/irritation. Can use twice daily for the first week 100 g 0   Multiple Vitamins-Minerals (PRESERVISION AREDS PO) Take 1 tablet by mouth 2 (two) times daily.     omeprazole  (PRILOSEC) 40 MG capsule TAKE 1 CAPSULE BY MOUTH IN  THE MORNING AND MAY REPEAT  DOSE IN THE EVENING AS  NEEDED FOR ACID REFLUX /  INDIGESTION 135 capsule 2   ondansetron  (ZOFRAN ) 4 MG tablet Take 1 tablet (4 mg total) by mouth every 8 (eight) hours as needed for nausea or vomiting. 30 tablet 3   sennosides-docusate sodium  (SENOKOT-S) 8.6-50 MG tablet Take 2 tablets  by mouth daily. 30 tablet 1   tetrahydrozoline 0.05 % ophthalmic solution Place 2 drops into both eyes daily as needed (for dry eyes).     traMADol  (ULTRAM ) 50 MG tablet Take 50 mg by mouth daily as needed for severe pain (pain score  7-10) or moderate pain (pain score 4-6).     triamcinolone  (NASACORT  ALLERGY 24HR) 55 MCG/ACT AERO nasal inhaler Place 2 sprays into the nose daily. 3 each 3   valsartan  (DIOVAN ) 80 MG tablet TAKE 1 TABLET BY MOUTH DAILY 90 tablet 2   No current facility-administered medications for this visit.    Allergies as of 07/22/2024 - Review Complete 06/04/2024  Allergen Reaction Noted   Tape Other (See Comments) 01/14/2024    Vitals: Ht 5' 5 (1.651 m)   Wt 179 lb (81.2 kg)   BMI 29.79 kg/m   @Orthostatic  VS for the past 24 hrs:  BP- Lying Pulse- Lying BP- Sitting Pulse- Sitting BP- Standing at 0 minutes Pulse- Standing at 0 minutes  07/22/24 1532 144/69 64 126/76 70 128/68 74       @VITALSLAST3  [[id number]]@ Physical exam:  General: The patient is awake, alert and appears not in acute distress.  The patient is well groomed. Head: Normocephalic, atraumatic.  Neck is supple.Skin:  With evidence of rash    Neurologic exam : The patient is awake and alert, oriented to place and time.   Memory subjective  described as intact.  There is a normal attention span & concentration ability.  Speech is fluent with mild dysarthria, dysphonia or aphasia.  Mood and affect are appropriate.  Cranial nerves: Pupils are equal and briskly reactive to light.  Funduscopic exam ; bilaterally wet macular degeneration  Extraocular movements  in vertical and horizontal planes intact and without nystagmus. Visual fields by finger perimetry are intact. Hearing t severely impaired.  ( Menieres disease, high pitch tinnitus. )  Facial sensation intact to fine touch.  Facial motor strength is symmetric and tongue and uvula move midline.  Motor exam:   Normal muscle bulk and symmetric normal strength in all extremities. Grip Strength equal , there is tremor with effort.  Tone is slightly elevated, not  smooth.  Proximal strength of shoulder muscles and hip flexors was intact .  Sensory:  Fine touch and  vibration were tested .  Proprioception was tested and was  normal.  Coordination: Rapid alternating movements in the fingers/hands were normal.  Finger-to-nose maneuver was tested and showed no evidence of ataxia, dysmetria or tremor.  Gait and station: Patient walked with/ without assistive device .Core Strength within normal limits.  Stance is stable and of wide/ normal. Base. She  turns with 3 Steps  and fast - walks fast,  narrow based,  normal arm-swing.   Deep tendon reflexes: in the upper a symmetric and attenuated Brisk..    Assessment: Total time for face to face interview and examination, for review of  images and laboratory testing, neurophysiology testing and pre-existing records, including out-of -network , was 65 minutes. Assessment is as follows here:  1)   paroxysmal spells, stereotypical and unprovoked spells of  aura, seconds to minutes of  loss of awareness, followed by fatigue and associated with a risk of falling.   2) no headaches, but differential is  ophthalmic migraines.  She has no Gx of migraines.    Plan:  Treatment plan and additional workup planned after today includes:   MRI brain would be nice, can she have the MRI  at [place] ? .    We look for vascular or lymphoma related brain changes.  EEG ordered.   This will be a 30 minutes study.  Started  Keppra  250 mg bid  and see if spells stop.  ( Keep journal )   Rv in 4-6 months with MD ( me)     The patient's condition requires frequent monitoring and adjustments in the treatment plan, reflecting the ongoing complexity of care.  This provider is the continuing focal point for all needed services for this condition.   Dedra Gores, MD  [facility] Board certified by The [employer] of Sleep Medicine and Diplomate of the [employer] of [employer] Medicine. Board certified In Neurology through the ABPN, Fellow of the [employer] of [employer].

## 2024-07-23 ENCOUNTER — Telehealth: Payer: Self-pay | Admitting: Neurology

## 2024-07-23 ENCOUNTER — Ambulatory Visit: Admitting: Neurology

## 2024-07-23 DIAGNOSIS — R55 Syncope and collapse: Secondary | ICD-10-CM | POA: Diagnosis not present

## 2024-07-23 DIAGNOSIS — Z950 Presence of cardiac pacemaker: Secondary | ICD-10-CM

## 2024-07-23 DIAGNOSIS — I480 Paroxysmal atrial fibrillation: Secondary | ICD-10-CM

## 2024-07-23 NOTE — Telephone Encounter (Signed)
 no auth required sent to [facility] [phone]

## 2024-07-24 ENCOUNTER — Telehealth: Payer: Self-pay | Admitting: Cardiology

## 2024-07-24 ENCOUNTER — Encounter: Payer: Self-pay | Admitting: Cardiology

## 2024-07-24 NOTE — Telephone Encounter (Signed)
 Pt c/o medication issue:  1. Name of Medication: pravastatin  (PRAVACHOL ) 80 MG tablet  2. How are you currently taking this medication (dosage and times per day)? N/A  3. Are you having a reaction (difficulty breathing--STAT)? N/A  4. What is your medication issue? Pt states she was told to stop this medication but she continued to take it and would like a c/b and a refill

## 2024-07-25 MED ORDER — PRAVASTATIN SODIUM 80 MG PO TABS: 80.0000 mg | ORAL_TABLET | Freq: Every evening | ORAL | 3 refills | Status: AC

## 2024-07-25 NOTE — Addendum Note (Signed)
 Addended by: Alsha Meland W on: 07/25/2024 07:22 AM   Modules accepted: Orders

## 2024-07-25 NOTE — Telephone Encounter (Signed)
Refill sent to the pharmacy electronically.  

## 2024-08-04 ENCOUNTER — Ambulatory Visit: Payer: Self-pay | Admitting: Neurology

## 2024-08-04 NOTE — Procedures (Signed)
 [facility]  EEG (ELECTROENCEPHALOGRAM) REPORT  Denay H Schneiderman    ORDERING CLINICIAN: Dedra Gores, M.D.  TECHNOLOGIST: , RPSGT, REEGT TECHNIQUE:  This EEG study was done with scalp electrodes positioned according to the 10-20 International system of electrode placement. Electrical activity was reviewed with band pass filter of 1-70Hz , sensitivity of 7 uV/mm, display speed of 59mm/sec with a 60Hz  notched filter applied as appropriate. EEG data were recorded continuously and digitally stored.    Recoding duration : 25 minutes  Activation included:  Photic stimulation ,not Hyperventilation .      Description: The EEG's posterior dominant background rhythm of 7.5 hertz was symmetrically displayed while the patient's eyes were closed  and promptly attenuated with eye opening. At baseline, the recording showed a very low amplitude in symmetric fashion.  Photic stimulation was initiated at frequencies from 3- through 21 hertz, resulting in minimal photic entrainment at any frequency . Following this maneuver, the EEG was reviewed for a period of several minutes and showed several time about 3 second -long of slow wave activity  at 3 hertz, high amplitude, bilaterally, with no spikes.  There was no change in heart rate, no change in respiratory rate  while the patient appeared awake./ No vertex sharps were seen,  and no outward manifestations of motor signs were associated.   ECG: heart rate at  60 BPM.   IMPRESSION:  This EEG was abnormally slow , indication of an unspecific encephalopathy such as seen in degenerative cognitive disorders.    Dr. Dedra Gores, M.D. Accredited by the ABPN, ABSM.

## 2024-08-12 ENCOUNTER — Other Ambulatory Visit: Payer: Self-pay | Admitting: Cardiology

## 2024-08-14 NOTE — Telephone Encounter (Signed)
 Pt of Dr. Pietro. Does Dr. Pietro want to refill this RX? Please advise.

## 2024-08-28 ENCOUNTER — Ambulatory Visit (HOSPITAL_COMMUNITY)

## 2024-09-26 ENCOUNTER — Ambulatory Visit: Admitting: Cardiology

## 2024-09-30 ENCOUNTER — Encounter

## 2024-12-30 ENCOUNTER — Encounter

## 2025-01-15 ENCOUNTER — Other Ambulatory Visit (HOSPITAL_COMMUNITY)

## 2025-01-15 ENCOUNTER — Ambulatory Visit: Admitting: Physician Assistant

## 2025-01-27 ENCOUNTER — Ambulatory Visit: Admitting: Neurology

## 2025-03-31 ENCOUNTER — Encounter

## 2025-06-30 ENCOUNTER — Encounter
